# Patient Record
Sex: Female | Born: 1945
Health system: Southern US, Community
[De-identification: ages and names within clinical notes are randomized; demographics above are authoritative.]

## PROBLEM LIST (undated history)

## (undated) DIAGNOSIS — I10 Essential (primary) hypertension: Secondary | ICD-10-CM

## (undated) DIAGNOSIS — D509 Iron deficiency anemia, unspecified: Secondary | ICD-10-CM

## (undated) DIAGNOSIS — J449 Chronic obstructive pulmonary disease, unspecified: Secondary | ICD-10-CM

## (undated) DIAGNOSIS — N390 Urinary tract infection, site not specified: Secondary | ICD-10-CM

## (undated) DIAGNOSIS — G4762 Sleep related leg cramps: Secondary | ICD-10-CM

## (undated) DIAGNOSIS — J189 Pneumonia, unspecified organism: Secondary | ICD-10-CM

## (undated) DIAGNOSIS — F1011 Alcohol abuse, in remission: Secondary | ICD-10-CM

## (undated) DIAGNOSIS — K589 Irritable bowel syndrome without diarrhea: Secondary | ICD-10-CM

## (undated) DIAGNOSIS — J479 Bronchiectasis, uncomplicated: Secondary | ICD-10-CM

## (undated) DIAGNOSIS — N179 Acute kidney failure, unspecified: Secondary | ICD-10-CM

## (undated) DIAGNOSIS — R0689 Other abnormalities of breathing: Secondary | ICD-10-CM

## (undated) DIAGNOSIS — K219 Gastro-esophageal reflux disease without esophagitis: Secondary | ICD-10-CM

## (undated) DIAGNOSIS — D649 Anemia, unspecified: Secondary | ICD-10-CM

## (undated) DIAGNOSIS — B9620 Unspecified Escherichia coli [E. coli] as the cause of diseases classified elsewhere: Secondary | ICD-10-CM

## (undated) DIAGNOSIS — J45909 Unspecified asthma, uncomplicated: Secondary | ICD-10-CM

## (undated) DIAGNOSIS — I499 Cardiac arrhythmia, unspecified: Secondary | ICD-10-CM

## (undated) DIAGNOSIS — E119 Type 2 diabetes mellitus without complications: Secondary | ICD-10-CM

## (undated) DIAGNOSIS — E785 Hyperlipidemia, unspecified: Secondary | ICD-10-CM

## (undated) DIAGNOSIS — R000 Tachycardia, unspecified: Secondary | ICD-10-CM

## (undated) DIAGNOSIS — R0600 Dyspnea, unspecified: Secondary | ICD-10-CM

## (undated) DIAGNOSIS — J40 Bronchitis, not specified as acute or chronic: Secondary | ICD-10-CM

## (undated) DIAGNOSIS — M069 Rheumatoid arthritis, unspecified: Secondary | ICD-10-CM

## (undated) DIAGNOSIS — J849 Interstitial pulmonary disease, unspecified: Secondary | ICD-10-CM

## (undated) DIAGNOSIS — J301 Allergic rhinitis due to pollen: Secondary | ICD-10-CM

## (undated) DIAGNOSIS — M1990 Unspecified osteoarthritis, unspecified site: Secondary | ICD-10-CM

## (undated) DIAGNOSIS — K759 Inflammatory liver disease, unspecified: Secondary | ICD-10-CM

## (undated) HISTORY — DX: Interstitial pulmonary disease, unspecified: J84.9

## (undated) HISTORY — DX: Essential (primary) hypertension: I10

## (undated) HISTORY — DX: Tachycardia, unspecified: R00.0

## (undated) HISTORY — DX: Unspecified Escherichia coli (E. coli) as the cause of diseases classified elsewhere: B96.20

## (undated) HISTORY — DX: Dyspnea, unspecified: R06.00

## (undated) HISTORY — DX: Rheumatoid arthritis, unspecified: M06.9

## (undated) HISTORY — DX: Type 2 diabetes mellitus without complications: E11.9

## (undated) HISTORY — DX: Iron deficiency anemia, unspecified: D50.9

## (undated) HISTORY — PX: HEMORRHOID SURGERY: SHX153

## (undated) HISTORY — PX: HERNIA REPAIR: SHX51

## (undated) HISTORY — DX: Chronic obstructive pulmonary disease, unspecified: J44.9

## (undated) HISTORY — DX: Allergic rhinitis due to pollen: J30.1

## (undated) HISTORY — DX: Sleep related leg cramps: G47.62

## (undated) HISTORY — DX: Acute kidney failure, unspecified: N17.9

## (undated) HISTORY — DX: Hyperlipidemia, unspecified: E78.5

## (undated) HISTORY — DX: Other abnormalities of breathing: R06.89

## (undated) HISTORY — DX: Bronchiectasis, uncomplicated: J47.9

## (undated) HISTORY — DX: Urinary tract infection, site not specified: N39.0

## (undated) HISTORY — PX: ABDOMINAL HYSTERECTOMY: SHX81

---

## 1994-11-13 DIAGNOSIS — M069 Rheumatoid arthritis, unspecified: Secondary | ICD-10-CM

## 1994-11-13 HISTORY — DX: Rheumatoid arthritis, unspecified: M06.9

## 2014-12-21 ENCOUNTER — Encounter (HOSPITAL_COMMUNITY): Payer: Self-pay | Admitting: Emergency Medicine

## 2014-12-21 ENCOUNTER — Emergency Department (HOSPITAL_COMMUNITY)
Admission: EM | Admit: 2014-12-21 | Discharge: 2014-12-21 | Disposition: A | Discharge status: Home / Self Care | Payer: Medicare HMO | Attending: Emergency Medicine | Admitting: Emergency Medicine

## 2014-12-21 DIAGNOSIS — E119 Type 2 diabetes mellitus without complications: Secondary | ICD-10-CM | POA: Diagnosis not present

## 2014-12-21 DIAGNOSIS — L301 Dyshidrosis [pompholyx]: Secondary | ICD-10-CM

## 2014-12-21 DIAGNOSIS — Z880 Allergy status to penicillin: Secondary | ICD-10-CM | POA: Diagnosis not present

## 2014-12-21 DIAGNOSIS — R21 Rash and other nonspecific skin eruption: Secondary | ICD-10-CM | POA: Diagnosis present

## 2014-12-21 HISTORY — DX: Type 2 diabetes mellitus without complications: E11.9

## 2014-12-21 MED ORDER — CLOBETASOL PROPIONATE 0.05 % EX GEL: 1.0000 "application " | Freq: Two times a day (BID) | CUTANEOUS | Status: DC

## 2014-12-21 NOTE — ED Provider Notes (Signed)
CSN: [id number]     Arrival date & time 12/21/14  1509 History  This chart was scribed for Dierdre Forth, PA, working with Mirian Mo, MD by Elon Spanner, ED Scribe. This patient was seen in room TR10C/TR10C and the patient's care was started at 4:47 PM.   Chief Complaint  Patient presents with  . Finger Injury   The history is provided by the patient and medical records. No language interpreter was used.   HPI Comments: Anita Gonzalez is a 69 y.o. female who presents to the Emergency Department complaining of a worsening cracking of the skin at the top of the right thumb onset several months ago that became red and began to peel several weeks ago.  Patient reports she has been wearing a plastic glove to help retain moisture.  Patient has also used neosporin daily. The patient reports she washes dishes and clothes at home, but denies that her hands are wet more than normal.  Patient reports a history of eczema.  Patient denies history of cold sores.  Patient does not bite her finger nails or cuticles.  No previous history of similar episodes. Patient denies fever, chills, N/V, increased warmth in the finger, streaking, purulent drainage from the site, pain at the site.  Patient is a diabetic and reports her blood sugars have been 103-108. She replies she has been compliant with her metformin.  Past Medical History  Diagnosis Date  . Diabetes mellitus without complication    History reviewed. No pertinent past surgical history. History reviewed. No pertinent family history. History  Substance Use Topics  . Smoking status: Never Smoker   . Smokeless tobacco: Not on file  . Alcohol Use: Yes     Comment: occ   OB History    No data available     Review of Systems  Constitutional: Negative for fever, chills, fatigue and unexpected weight change.  HENT: Negative for mouth sores.   Respiratory: Negative for chest tightness, shortness of breath and wheezing.   Gastrointestinal: Negative  for nausea, vomiting and diarrhea.  Endocrine: Negative for polydipsia, polyphagia and polyuria.  Musculoskeletal: Negative for neck pain and neck stiffness.  Skin: Positive for color change and rash. Negative for wound.  Allergic/Immunologic: Negative for immunocompromised state.  Hematological: Negative for adenopathy. Does not bruise/bleed easily.  Psychiatric/Behavioral: The patient is not nervous/anxious.     Patient has a history of DM controlled by metformin.  She checks her blood sugar regularly and it was recently measured in the low 100s.   Allergies  Iodinated diagnostic agents; Penicillins; and Percocet  Home Medications   Prior to Admission medications   Medication Sig Start Date End Date Taking? Authorizing Provider  clobetasol (TEMOVATE) 0.05 % GEL Apply 1 application topically 2 (two) times daily. 12/21/14   Junelle Hashemi, PA-C   BP 110/59 mmHg  Pulse 85  Temp(Src) 98.3 F (36.8 C) (Oral)  Resp 18  Ht 5\' 1"  (1.549 m)  Wt 221 lb (100.245 kg)  BMI 41.78 kg/m2  SpO2 95% Physical Exam  Constitutional: She is oriented to person, place, and time. She appears well-developed and well-nourished. No distress.  HENT:  Head: Normocephalic and atraumatic.  Right Ear: Tympanic membrane, external ear and ear canal normal.  Left Ear: Tympanic membrane, external ear and ear canal normal.  Nose: Nose normal. No mucosal edema or rhinorrhea.  Mouth/Throat: Uvula is midline. No uvula swelling. No oropharyngeal exudate, posterior oropharyngeal edema, posterior oropharyngeal erythema or tonsillar abscesses.  No swelling of  the uvula or oropharynx   Eyes: Conjunctivae are normal.  Neck: Normal range of motion.  Patent airway No stridor; normal phonation Handling secretions without difficulty  Cardiovascular: Normal rate, normal heart sounds and intact distal pulses.   No murmur heard. Pulmonary/Chest: Effort normal and breath sounds normal. No stridor. No respiratory  distress. She has no wheezes.  No wheezes or rhonchi  Abdominal: Soft. Bowel sounds are normal. There is no tenderness.  Musculoskeletal: Normal range of motion. She exhibits no edema.  Neurological: She is alert and oriented to person, place, and time.  Skin: Skin is warm and dry. Rash noted. She is not diaphoretic.  Right thumb with small vesicles, dry and peeling skin along the dorsum of the thumb, palmar side and lateral aspects. No increased warmth, induration, purulent drainage. No evidence of felon or paronychia. Patient with excoriations noted to the rash  Psychiatric: She has a normal mood and affect.  Nursing note and vitals reviewed.   ED Course  Procedures (including critical care time)  DIAGNOSTIC STUDIES: Oxygen Saturation is 94% on RA, adequate by my interpretation.    COORDINATION OF CARE:  4:52 PM Discussed treatment plan with patient at bedside.  Patient acknowledges and agrees with plan.    Labs Review Labs Reviewed - No data to display  Imaging Review No results found.   EKG Interpretation None      MDM   Final diagnoses:  Dyshidrotic eczema  Non-insulin dependent type 2 diabetes mellitus   Anita Gonzalez presents with rash to the right thumb present for several months and worsening in the last several weeks. Rash consistent with dyshidrotic eczema. Patient has been keeping the site wet with Neosporin and clothes to keep it "moist" I believe this is made things worse. There is no evidence of infection, secondary or primary. No purulent drainage, induration or increased warmth. Patient will be given a topical steroid for it. She is to see her primary care doctor in 3 days for follow-up and is to obtain a referral to dermatology for further evaluation and treatment. Precautions given for return if infectious symptoms appear and these have been discussed with the patient.  Patient with history of diabetes however reports blood sugars at home have been within the  normal range and there is no evidence of infection today.  I have personally reviewed patient's vitals, nursing note and any pertinent labs or imaging.  I performed an focused physical exam; undressed when appropriate .    It has been determined that no acute conditions requiring further emergency intervention are present at this time. The patient/guardian have been advised of the diagnosis and plan. I reviewed any labs and imaging including any potential incidental findings. We have discussed signs and symptoms that warrant return to the ED and they are listed in the discharge instructions.    Vital signs are stable at discharge.   BP 110/59 mmHg  Pulse 85  Temp(Src) 98.3 F (36.8 C) (Oral)  Resp 18  Ht 5\' 1"  (1.549 m)  Wt 221 lb (100.245 kg)  BMI 41.78 kg/m2  SpO2 95%   I personally performed the services described in this documentation, which was scribed in my presence. The recorded information has been reviewed and is accurate.   Pruitt Taboada, PA-C 12/21/14 1732  02/19/15, MD 12/24/14 [phone]

## 2014-12-21 NOTE — ED Notes (Signed)
Declined W/C at D/C and was escorted to lobby by RN. 

## 2014-12-21 NOTE — Discharge Instructions (Signed)
1. Medications: Clobetasol gel applied to your finger, usual home medications 2. Treatment: rest, drink plenty of fluids, use lukewarm water, avoid soap, detergents, alcohol or stringent solutions; right hands thoroughly after washing, wear protective gloves in cold weather 3. Follow Up: Please followup with your primary doctor in 2 days for discussion of your diagnoses and further evaluation after today's visit; if you do not have a primary care doctor use the resource guide provided to find one; Please return to the ER for worsening symptoms, swelling, purulent drainage, streaking up the finger, hand or arm, systemic fevers

## 2014-12-21 NOTE — ED Notes (Signed)
Pt with right thumb cuts that now skin is peeling and some areas are red; no drainage noted; pt sts wound x 1 week ago

## 2015-01-04 ENCOUNTER — Telehealth: Payer: Self-pay | Admitting: Hematology

## 2015-01-04 NOTE — Telephone Encounter (Signed)
vm has not been set up unable to lv mess regarding appt called referring office for alt number. Dx:  Chronic anemia Referring Dr. Regan Lemming

## 2015-01-06 ENCOUNTER — Encounter (HOSPITAL_COMMUNITY): Payer: Self-pay | Admitting: Emergency Medicine

## 2015-01-06 ENCOUNTER — Emergency Department (HOSPITAL_COMMUNITY): Payer: Medicare HMO

## 2015-01-06 ENCOUNTER — Inpatient Hospital Stay (HOSPITAL_COMMUNITY)
Admission: EM | Admit: 2015-01-06 | Discharge: 2015-01-11 | DRG: 871 | Disposition: A | Discharge status: Home Health | Payer: Medicare HMO | Attending: Internal Medicine | Admitting: Internal Medicine

## 2015-01-06 DIAGNOSIS — N179 Acute kidney failure, unspecified: Secondary | ICD-10-CM | POA: Diagnosis present

## 2015-01-06 DIAGNOSIS — R630 Anorexia: Secondary | ICD-10-CM | POA: Diagnosis present

## 2015-01-06 DIAGNOSIS — M1990 Unspecified osteoarthritis, unspecified site: Secondary | ICD-10-CM | POA: Diagnosis present

## 2015-01-06 DIAGNOSIS — B9620 Unspecified Escherichia coli [E. coli] as the cause of diseases classified elsewhere: Secondary | ICD-10-CM | POA: Diagnosis present

## 2015-01-06 DIAGNOSIS — Z79899 Other long term (current) drug therapy: Secondary | ICD-10-CM

## 2015-01-06 DIAGNOSIS — E860 Dehydration: Secondary | ICD-10-CM | POA: Diagnosis present

## 2015-01-06 DIAGNOSIS — F5104 Psychophysiologic insomnia: Secondary | ICD-10-CM | POA: Diagnosis present

## 2015-01-06 DIAGNOSIS — J45901 Unspecified asthma with (acute) exacerbation: Secondary | ICD-10-CM | POA: Diagnosis present

## 2015-01-06 DIAGNOSIS — J9601 Acute respiratory failure with hypoxia: Secondary | ICD-10-CM | POA: Diagnosis present

## 2015-01-06 DIAGNOSIS — D509 Iron deficiency anemia, unspecified: Secondary | ICD-10-CM | POA: Diagnosis present

## 2015-01-06 DIAGNOSIS — J189 Pneumonia, unspecified organism: Secondary | ICD-10-CM | POA: Diagnosis present

## 2015-01-06 DIAGNOSIS — E119 Type 2 diabetes mellitus without complications: Secondary | ICD-10-CM

## 2015-01-06 DIAGNOSIS — M069 Rheumatoid arthritis, unspecified: Secondary | ICD-10-CM | POA: Diagnosis present

## 2015-01-06 DIAGNOSIS — D649 Anemia, unspecified: Secondary | ICD-10-CM | POA: Diagnosis present

## 2015-01-06 DIAGNOSIS — Z87891 Personal history of nicotine dependence: Secondary | ICD-10-CM

## 2015-01-06 DIAGNOSIS — E785 Hyperlipidemia, unspecified: Secondary | ICD-10-CM

## 2015-01-06 DIAGNOSIS — J441 Chronic obstructive pulmonary disease with (acute) exacerbation: Secondary | ICD-10-CM | POA: Diagnosis present

## 2015-01-06 DIAGNOSIS — I10 Essential (primary) hypertension: Secondary | ICD-10-CM

## 2015-01-06 DIAGNOSIS — A419 Sepsis, unspecified organism: Secondary | ICD-10-CM | POA: Diagnosis present

## 2015-01-06 DIAGNOSIS — I4510 Unspecified right bundle-branch block: Secondary | ICD-10-CM | POA: Diagnosis present

## 2015-01-06 DIAGNOSIS — R509 Fever, unspecified: Secondary | ICD-10-CM | POA: Diagnosis present

## 2015-01-06 DIAGNOSIS — R000 Tachycardia, unspecified: Secondary | ICD-10-CM | POA: Diagnosis present

## 2015-01-06 DIAGNOSIS — N390 Urinary tract infection, site not specified: Secondary | ICD-10-CM | POA: Diagnosis present

## 2015-01-06 DIAGNOSIS — R0602 Shortness of breath: Secondary | ICD-10-CM

## 2015-01-06 HISTORY — DX: Unspecified osteoarthritis, unspecified site: M19.90

## 2015-01-06 HISTORY — DX: Hyperlipidemia, unspecified: E78.5

## 2015-01-06 HISTORY — DX: Type 2 diabetes mellitus without complications: E11.9

## 2015-01-06 HISTORY — DX: Essential (primary) hypertension: I10

## 2015-01-06 HISTORY — DX: Unspecified asthma, uncomplicated: J45.909

## 2015-01-06 LAB — URINALYSIS, ROUTINE W REFLEX MICROSCOPIC
Bilirubin Urine: NEGATIVE
Glucose, UA: NEGATIVE mg/dL
Hgb urine dipstick: NEGATIVE
KETONES UR: NEGATIVE mg/dL
NITRITE: NEGATIVE
Protein, ur: NEGATIVE mg/dL
Specific Gravity, Urine: 1.013 (ref 1.005–1.030)
UROBILINOGEN UA: 1 mg/dL (ref 0.0–1.0)
pH: 6 (ref 5.0–8.0)

## 2015-01-06 LAB — I-STAT CG4 LACTIC ACID, ED: Lactic Acid, Venous: 1.14 mmol/L (ref 0.5–2.0)

## 2015-01-06 LAB — URINE MICROSCOPIC-ADD ON

## 2015-01-06 LAB — COMPREHENSIVE METABOLIC PANEL
ALBUMIN: 3.6 g/dL (ref 3.5–5.2)
ALT: 21 U/L (ref 0–35)
AST: 29 U/L (ref 0–37)
Alkaline Phosphatase: 90 U/L (ref 39–117)
Anion gap: 10 (ref 5–15)
BUN: 14 mg/dL (ref 6–23)
CALCIUM: 9 mg/dL (ref 8.4–10.5)
CO2: 27 mmol/L (ref 19–32)
CREATININE: 1.17 mg/dL — AB (ref 0.50–1.10)
Chloride: 100 mmol/L (ref 96–112)
GFR calc Af Amer: 55 mL/min — ABNORMAL LOW (ref >90)
GFR calc non Af Amer: 47 mL/min — ABNORMAL LOW (ref >90)
Glucose, Bld: 97 mg/dL (ref 70–99)
Potassium: 4.7 mmol/L (ref 3.5–5.1)
SODIUM: 137 mmol/L (ref 135–145)
Total Bilirubin: 0.6 mg/dL (ref 0.3–1.2)
Total Protein: 7.4 g/dL (ref 6.0–8.3)

## 2015-01-06 LAB — CBC
HCT: 27.7 % — ABNORMAL LOW (ref 36.0–46.0)
Hemoglobin: 8.3 g/dL — ABNORMAL LOW (ref 12.0–15.0)
MCH: 25.9 pg — AB (ref 26.0–34.0)
MCHC: 30 g/dL (ref 30.0–36.0)
MCV: 86.3 fL (ref 78.0–100.0)
Platelets: 355 10*3/uL (ref 150–400)
RBC: 3.21 MIL/uL — ABNORMAL LOW (ref 3.87–5.11)
RDW: 17.2 % — AB (ref 11.5–15.5)
WBC: 12.5 10*3/uL — ABNORMAL HIGH (ref 4.0–10.5)

## 2015-01-06 LAB — STREP PNEUMONIAE URINARY ANTIGEN: Strep Pneumo Urinary Antigen: NEGATIVE

## 2015-01-06 LAB — EXPECTORATED SPUTUM ASSESSMENT W REFEX TO RESP CULTURE

## 2015-01-06 LAB — EXPECTORATED SPUTUM ASSESSMENT W GRAM STAIN, RFLX TO RESP C

## 2015-01-06 LAB — GLUCOSE, CAPILLARY: GLUCOSE-CAPILLARY: 245 mg/dL — AB (ref 70–99)

## 2015-01-06 MED ORDER — METHYLPREDNISOLONE SODIUM SUCC 40 MG IJ SOLR
40.0000 mg | Freq: Two times a day (BID) | INTRAMUSCULAR | Status: AC
  Administered 2015-01-07 – 2015-01-09 (×6): 40 mg via INTRAVENOUS
  Filled 2015-01-06 (×6): qty 1

## 2015-01-06 MED ORDER — METHYLPREDNISOLONE SODIUM SUCC 125 MG IJ SOLR
125.0000 mg | Freq: Once | INTRAMUSCULAR | Status: AC
  Administered 2015-01-06: 125 mg via INTRAVENOUS
  Filled 2015-01-06: qty 2

## 2015-01-06 MED ORDER — CYCLOBENZAPRINE HCL 10 MG PO TABS
10.0000 mg | ORAL_TABLET | Freq: Every day | ORAL | Status: DC
  Administered 2015-01-06 – 2015-01-11 (×6): 10 mg via ORAL
  Filled 2015-01-06 (×6): qty 1

## 2015-01-06 MED ORDER — BUMETANIDE 2 MG PO TABS
2.0000 mg | ORAL_TABLET | Freq: Every day | ORAL | Status: DC
  Filled 2015-01-06: qty 1

## 2015-01-06 MED ORDER — IPRATROPIUM BROMIDE 0.02 % IN SOLN
0.5000 mg | Freq: Four times a day (QID) | RESPIRATORY_TRACT | Status: DC
  Administered 2015-01-06 – 2015-01-07 (×3): 0.5 mg via RESPIRATORY_TRACT
  Filled 2015-01-06 (×3): qty 2.5

## 2015-01-06 MED ORDER — ENOXAPARIN SODIUM 60 MG/0.6ML ~~LOC~~ SOLN
50.0000 mg | SUBCUTANEOUS | Status: DC
  Administered 2015-01-06 – 2015-01-07 (×2): 50 mg via SUBCUTANEOUS
  Filled 2015-01-06 (×3): qty 0.6

## 2015-01-06 MED ORDER — INSULIN ASPART 100 UNIT/ML ~~LOC~~ SOLN
0.0000 [IU] | Freq: Three times a day (TID) | SUBCUTANEOUS | Status: DC
  Administered 2015-01-07: 1 [IU] via SUBCUTANEOUS
  Administered 2015-01-07 (×2): 2 [IU] via SUBCUTANEOUS
  Administered 2015-01-08 – 2015-01-10 (×4): 1 [IU] via SUBCUTANEOUS
  Administered 2015-01-10: 2 [IU] via SUBCUTANEOUS
  Administered 2015-01-11: 1 [IU] via SUBCUTANEOUS

## 2015-01-06 MED ORDER — CLOBETASOL PROPIONATE 0.05 % EX OINT
TOPICAL_OINTMENT | Freq: Two times a day (BID) | CUTANEOUS | Status: DC
  Administered 2015-01-06 – 2015-01-11 (×9): via TOPICAL
  Filled 2015-01-06 (×2): qty 15

## 2015-01-06 MED ORDER — GUAIFENESIN ER 600 MG PO TB12
600.0000 mg | ORAL_TABLET | Freq: Two times a day (BID) | ORAL | Status: DC
  Administered 2015-01-06 – 2015-01-08 (×4): 600 mg via ORAL
  Filled 2015-01-06 (×4): qty 1

## 2015-01-06 MED ORDER — MONTELUKAST SODIUM 10 MG PO TABS
10.0000 mg | ORAL_TABLET | Freq: Every day | ORAL | Status: DC
  Administered 2015-01-06 – 2015-01-10 (×5): 10 mg via ORAL
  Filled 2015-01-06 (×5): qty 1

## 2015-01-06 MED ORDER — IBUPROFEN 200 MG PO TABS
600.0000 mg | ORAL_TABLET | Freq: Three times a day (TID) | ORAL | Status: DC
  Administered 2015-01-06: 600 mg via ORAL
  Filled 2015-01-06: qty 3

## 2015-01-06 MED ORDER — ACETAMINOPHEN 650 MG RE SUPP: 650.0000 mg | Freq: Four times a day (QID) | RECTAL | Status: DC | PRN

## 2015-01-06 MED ORDER — CEFTRIAXONE SODIUM IN DEXTROSE 20 MG/ML IV SOLN
1.0000 g | INTRAVENOUS | Status: DC
  Administered 2015-01-07 – 2015-01-10 (×4): 1 g via INTRAVENOUS
  Filled 2015-01-06 (×5): qty 50

## 2015-01-06 MED ORDER — SODIUM CHLORIDE 0.9 % IJ SOLN
3.0000 mL | Freq: Two times a day (BID) | INTRAMUSCULAR | Status: DC
  Administered 2015-01-06 – 2015-01-11 (×6): 3 mL via INTRAVENOUS

## 2015-01-06 MED ORDER — PANTOPRAZOLE SODIUM 40 MG PO TBEC
40.0000 mg | DELAYED_RELEASE_TABLET | Freq: Every day | ORAL | Status: DC
  Administered 2015-01-07 – 2015-01-11 (×5): 40 mg via ORAL
  Filled 2015-01-06 (×5): qty 1

## 2015-01-06 MED ORDER — LEVALBUTEROL HCL 1.25 MG/0.5ML IN NEBU
1.2500 mg | INHALATION_SOLUTION | Freq: Once | RESPIRATORY_TRACT | Status: AC
  Administered 2015-01-06: 1.25 mg via RESPIRATORY_TRACT
  Filled 2015-01-06: qty 0.5

## 2015-01-06 MED ORDER — ACETAMINOPHEN 500 MG PO TABS
1000.0000 mg | ORAL_TABLET | Freq: Once | ORAL | Status: AC
  Administered 2015-01-06: 1000 mg via ORAL
  Filled 2015-01-06: qty 2

## 2015-01-06 MED ORDER — IRBESARTAN 150 MG PO TABS: 75.0000 mg | ORAL_TABLET | Freq: Every day | ORAL | Status: DC

## 2015-01-06 MED ORDER — CLOBETASOL PROPIONATE 0.05 % EX GEL: 1.0000 "application " | Freq: Two times a day (BID) | CUTANEOUS | Status: DC

## 2015-01-06 MED ORDER — SODIUM CHLORIDE 0.9 % IV SOLN
INTRAVENOUS | Status: AC
  Administered 2015-01-06: 19:00:00 via INTRAVENOUS

## 2015-01-06 MED ORDER — SIMVASTATIN 20 MG PO TABS
20.0000 mg | ORAL_TABLET | Freq: Every day | ORAL | Status: DC
  Administered 2015-01-06 – 2015-01-10 (×5): 20 mg via ORAL
  Filled 2015-01-06 (×5): qty 1

## 2015-01-06 MED ORDER — DEXTROSE 5 % IV SOLN
500.0000 mg | Freq: Once | INTRAVENOUS | Status: AC
  Administered 2015-01-06: 500 mg via INTRAVENOUS
  Filled 2015-01-06: qty 500

## 2015-01-06 MED ORDER — ONDANSETRON HCL 4 MG PO TABS: 4.0000 mg | ORAL_TABLET | Freq: Four times a day (QID) | ORAL | Status: DC | PRN

## 2015-01-06 MED ORDER — ONDANSETRON HCL 4 MG/2ML IJ SOLN: 4.0000 mg | Freq: Four times a day (QID) | INTRAMUSCULAR | Status: DC | PRN

## 2015-01-06 MED ORDER — DICLOFENAC SODIUM 1 % TD GEL
2.0000 g | Freq: Two times a day (BID) | TRANSDERMAL | Status: DC
  Administered 2015-01-06 – 2015-01-10 (×8): 2 g via TOPICAL
  Filled 2015-01-06: qty 100

## 2015-01-06 MED ORDER — VERAPAMIL HCL ER 240 MG PO TBCR
240.0000 mg | EXTENDED_RELEASE_TABLET | Freq: Every day | ORAL | Status: DC
  Administered 2015-01-06 – 2015-01-09 (×4): 240 mg via ORAL
  Filled 2015-01-06 (×5): qty 1

## 2015-01-06 MED ORDER — ACETAMINOPHEN 325 MG PO TABS
650.0000 mg | ORAL_TABLET | Freq: Four times a day (QID) | ORAL | Status: DC | PRN
  Administered 2015-01-10: 650 mg via ORAL
  Filled 2015-01-06: qty 2

## 2015-01-06 MED ORDER — VITAMINS A & D EX OINT
TOPICAL_OINTMENT | CUTANEOUS | Status: AC
  Administered 2015-01-06: 22:00:00
  Filled 2015-01-06: qty 5

## 2015-01-06 MED ORDER — DEXTROSE 5 % IV SOLN
1.0000 g | Freq: Once | INTRAVENOUS | Status: AC
  Administered 2015-01-06: 1 g via INTRAVENOUS
  Filled 2015-01-06: qty 10

## 2015-01-06 MED ORDER — ALBUTEROL SULFATE (2.5 MG/3ML) 0.083% IN NEBU: 2.5000 mg | INHALATION_SOLUTION | RESPIRATORY_TRACT | Status: DC | PRN

## 2015-01-06 MED ORDER — GUAIFENESIN-DM 100-10 MG/5ML PO SYRP
5.0000 mL | ORAL_SOLUTION | ORAL | Status: DC | PRN
  Administered 2015-01-10: 5 mL via ORAL
  Filled 2015-01-06: qty 10

## 2015-01-06 MED ORDER — ALBUTEROL SULFATE (2.5 MG/3ML) 0.083% IN NEBU
2.5000 mg | INHALATION_SOLUTION | Freq: Four times a day (QID) | RESPIRATORY_TRACT | Status: DC
Start: 2015-01-06 — End: 2015-01-07
  Administered 2015-01-06 – 2015-01-07 (×3): 2.5 mg via RESPIRATORY_TRACT
  Filled 2015-01-06 (×3): qty 3

## 2015-01-06 MED ORDER — IPRATROPIUM BROMIDE 0.02 % IN SOLN
1.0000 mg | Freq: Once | RESPIRATORY_TRACT | Status: AC
  Administered 2015-01-06: 1 mg via RESPIRATORY_TRACT
  Filled 2015-01-06: qty 5

## 2015-01-06 MED ORDER — POTASSIUM CHLORIDE CRYS ER 10 MEQ PO TBCR: 10.0000 meq | EXTENDED_RELEASE_TABLET | Freq: Every day | ORAL | Status: DC

## 2015-01-06 MED ORDER — VERAPAMIL HCL ER 240 MG PO CP24: 240.0000 mg | ORAL_CAPSULE | Freq: Every day | ORAL | Status: DC

## 2015-01-06 MED ORDER — MOMETASONE FURO-FORMOTEROL FUM 200-5 MCG/ACT IN AERO
2.0000 | INHALATION_SPRAY | Freq: Two times a day (BID) | RESPIRATORY_TRACT | Status: DC
  Administered 2015-01-07 – 2015-01-11 (×9): 2 via RESPIRATORY_TRACT
  Filled 2015-01-06: qty 8.8

## 2015-01-06 MED ORDER — DEXTROSE 5 % IV SOLN
500.0000 mg | INTRAVENOUS | Status: DC
  Administered 2015-01-07 – 2015-01-10 (×4): 500 mg via INTRAVENOUS
  Filled 2015-01-06 (×4): qty 500

## 2015-01-06 MED ORDER — FOLIC ACID 1 MG PO TABS
1.0000 mg | ORAL_TABLET | Freq: Every day | ORAL | Status: DC
  Administered 2015-01-07 – 2015-01-11 (×5): 1 mg via ORAL
  Filled 2015-01-06 (×5): qty 1

## 2015-01-06 NOTE — ED Notes (Signed)
Bed: WA14 Expected date:  Expected time:  Means of arrival:  Comments: EMS respiratory distress 

## 2015-01-06 NOTE — ED Notes (Signed)
Per EMS pt comes from home for shob x week. Pt has PMH asthma and had cold as well.

## 2015-01-06 NOTE — ED Notes (Signed)
Patient currently receiving a breathing treatment, will obtain urine sample after.

## 2015-01-06 NOTE — H&P (Signed)
History and Physical  Atenas Homolka WGN:[id number] DOB: 07/14/1946 DOA: 01/06/2015  Referring physician: Dr. Elwin Mocha, EDP PCP: Dr. Lerry Liner Outpatient Specialists:  1. None  Chief Complaint: Fever, chills, productive cough, dyspnea, wheezing, chest pain and poor appetite.  HPI: Anita Gonzalez is a 69 y.o. female with history of DM 2, HTN, HLD, asthma, rheumatoid arthritis, former smoker, recently moved to this area from [place], presented to the ED with above complaints. She started noticing above symptoms a week ago. Unable to check temperatures at home-no thermometer. Cough initially productive of white sputum which subsequently changed color to [facility] then green. Dyspnea and wheezing have been progressive and worsening. Patient complains of mostly right mid chest pain only on deep breathing or violent coughing. No hemoptysis. Appetite decreased. No nausea, vomiting, abdominal pain or diarrhea. No urinary frequency or dysuria. No history of recent long-distance travel or asymmetric leg swelling or pain. She complains of diffuse aches and pains. She states that she was unable to to seek medical attention due to lack of transportation. In the ED, febrile to 102.6F, tachycardic, WBC 12.5 and chest x-ray suggestive of right lower lobe pneumonia. Hospitalist admission requested.   Review of Systems: All systems reviewed and apart from history of presenting illness, are negative.  Past Medical History  Diagnosis Date  . Diabetes mellitus without complication   . Asthma   . Arthritis   . Hypertension    Past Surgical History  Procedure Laterality Date  . Cesarean section    . Hernia repair    . Hemorrhoid surgery     Social History:  reports that she has quit smoking. She does not have any smokeless tobacco history on file. She reports that she drinks alcohol. She reports that she does not use illicit drugs. Single. Lives with patient's daughter and son-in-law. Ambulates with the  help of a cane.  Allergies  Allergen Reactions  . Fish Allergy Anaphylaxis  . Iodinated Diagnostic Agents   . Penicillins   . Percocet [Oxycodone-Acetaminophen]   . Prednisone Nausea Only    History reviewed. No pertinent family history. no significant family history.  Prior to Admission medications   Medication Sig Start Date End Date Taking? Authorizing Provider  albuterol (PROVENTIL HFA;VENTOLIN HFA) 108 (90 BASE) MCG/ACT inhaler Inhale 2 puffs into the lungs every 6 (six) hours as needed for wheezing or shortness of breath (wheezing).    Yes Historical Provider, MD  bumetanide (BUMEX) 2 MG tablet Take 2 mg by mouth daily.   Yes Historical Provider, MD  cyclobenzaprine (FLEXERIL) 10 MG tablet Take 10 mg by mouth daily.   Yes Historical Provider, MD  diclofenac sodium (VOLTAREN) 1 % GEL Apply 2 g topically 2 (two) times daily.   Yes Historical Provider, MD  Fluticasone-Salmeterol (ADVAIR) 500-50 MCG/DOSE AEPB Inhale 2 puffs into the lungs daily.   Yes Historical Provider, MD  folic acid (FOLVITE) 1 MG tablet Take 1 mg by mouth daily.   Yes Historical Provider, MD  ibuprofen (ADVIL,MOTRIN) 600 MG tablet Take 600 mg by mouth 3 (three) times daily.   Yes Historical Provider, MD  metFORMIN (GLUCOPHAGE) 1000 MG tablet Take 1,000 mg by mouth 2 (two) times daily with a meal.   Yes Historical Provider, MD  montelukast (SINGULAIR) 10 MG tablet Take 10 mg by mouth at bedtime.   Yes Historical Provider, MD  omeprazole (PRILOSEC) 20 MG capsule Take 20 mg by mouth daily.   Yes Historical Provider, MD  potassium chloride (K-DUR,KLOR-CON) 10  MEQ tablet Take 10 mEq by mouth daily.   Yes Historical Provider, MD  simvastatin (ZOCOR) 20 MG tablet Take 20 mg by mouth daily.   Yes Historical Provider, MD  valsartan (DIOVAN) 80 MG tablet Take 80 mg by mouth at bedtime.   Yes Historical Provider, MD  verapamil (VERELAN PM) 240 MG 24 hr capsule Take 240 mg by mouth daily.    Yes Historical Provider, MD    clobetasol (TEMOVATE) 0.05 % GEL Apply 1 application topically 2 (two) times daily. 12/21/14   Dierdre Forth, PA-C   Physical Exam: Filed Vitals:   01/06/15 1608 01/06/15 1658 01/06/15 1711 01/06/15 1730  BP: 140/52   119/62  Pulse: 122  114 112  Temp:      TempSrc:      Resp: 18  21 16   SpO2: 97% 99% 98% 98%     General exam: Moderately built and nourished pleasant middle-aged female patient, lying comfortably propped up on the gurney in no obvious distress.  Head, eyes and ENT: Nontraumatic and normocephalic. Pupils equally reacting to light and accommodation. Oral mucosa mildly dry. Bilateral cataracts and arcus senilis.  Neck: Supple. No JVD, carotid bruit or thyromegaly.  Lymphatics: No lymphadenopathy.  Respiratory system: Reduced breath sounds in the right base with bronchial breath sounds and a few crackles but no pleural rub. Occasional left basal crackles. Rest of lung fields clear to auscultation. No increased work of breathing. Able to speak in full sentences.  Cardiovascular system: S1 and S2 heard, regular tachycardic. No JVD, murmurs, gallops, clicks or pedal edema.  Gastrointestinal system: Abdomen is nondistended, soft and nontender. Normal bowel sounds heard. No organomegaly or masses appreciated.  Central nervous system: Alert and oriented. No focal neurological deficits.  Extremities: Symmetric 5 x 5 power. Peripheral pulses symmetrically felt.   Skin: No rashes or acute findings.  Musculoskeletal system: Negative exam.  Psychiatry: Pleasant and cooperative.   Labs on Admission:  Basic Metabolic Panel:  Recent Labs Lab 01/06/15 1531  NA 137  K 4.7  CL 100  CO2 27  GLUCOSE 97  BUN 14  CREATININE 1.17*  CALCIUM 9.0   Liver Function Tests:  Recent Labs Lab 01/06/15 1531  AST 29  ALT 21  ALKPHOS 90  BILITOT 0.6  PROT 7.4  ALBUMIN 3.6   No results for input(s): LIPASE, AMYLASE in the last 168 hours. No results for input(s):  AMMONIA in the last 168 hours. CBC:  Recent Labs Lab 01/06/15 1531  WBC 12.5*  HGB 8.3*  HCT 27.7*  MCV 86.3  PLT 355   Cardiac Enzymes: No results for input(s): CKTOTAL, CKMB, CKMBINDEX, TROPONINI in the last 168 hours.  BNP (last 3 results) No results for input(s): PROBNP in the last 8760 hours. CBG: No results for input(s): GLUCAP in the last 168 hours.  Radiological Exams on Admission: Dg Chest Portable 1 View  01/06/2015   CLINICAL DATA:  One week history of difficulty breathing  EXAM: PORTABLE CHEST - 1 VIEW  COMPARISON:  None.  FINDINGS: There is airspace consolidation in the right lower lobe. Lungs elsewhere clear. Heart size and pulmonary vascularity are normal. No adenopathy. No bone lesions.  IMPRESSION: Right lower lobe airspace consolidation.   Electronically Signed   By: 01/08/2015 III M.D.   On: 01/06/2015 16:03    EKG: Independently reviewed. Sinus tachycardia at 120 bpm,? MAT, normal axis, incomplete RBBB. No prior EKGs to compare  Assessment/Plan Principal Problem:   Community acquired pneumonia-RLL Active  Problems:   Sepsis due to pneumonia   Asthma exacerbation   Anemia   Essential hypertension   DM II (diabetes mellitus, type II), controlled   HLD (hyperlipidemia)   Rheumatoid arthritis   CAP (community acquired pneumonia)   1. Right lower lobe community acquired pneumonia: Continue IV Rocephin and azithromycin. Follow blood cultures, urine Legionella and streptococcal antigen results. 2. Sepsis due to pneumonia: Present on admission. IV fluids and antibiotics as above. Hemodynamically stable. 3. Asthma exacerbation: Precipitated by pneumonia. Improved after bronchodilators and a dose of Solu-Medrol in ED. Continue oxygen, bronchodilators, Dulera and brief IV Solu-Medrol. 4. Mild dehydration: Secondary to poor oral intake and sepsis. Brief IV fluids. Hold diuretics. 5. Essential hypertension: Controlled. Continue home medications except  diuretics. 6. Type II DM: Controlled. Hold metformin. SSI. 7. Rheumatoid arthritis: Patient takes scheduled NSAID/ibuprofen. Continue same. 8. Anemia: May be secondary to chronic disease. No prior labs to compare. Follow CBC in a.m. No reported bleeding. 9. History of HLD     Code Status: Full  Family Communication: Discussed with daughter at bedside.  Disposition Plan: Home when medically stable.   Time spent: 60 minutes.  Marcellus Scott, MD, FACP, FHM. [facility] Pager [phone]  If 7PM-7AM, please contact night-coverage [email or web address] Password [facility] 01/06/2015, 5:51 PM

## 2015-01-06 NOTE — ED Provider Notes (Signed)
CSN: [id number]     Arrival date & time 01/06/15  1510 History   First MD Initiated Contact with Patient 01/06/15 1520     Chief Complaint  Patient presents with  . Shortness of Breath  . Fever     (Consider location/radiation/quality/duration/timing/severity/associated sxs/prior Treatment) Patient is a 69 y.o. female presenting with shortness of breath and fever. The history is provided by the patient.  Shortness of Breath Severity:  Moderate Onset quality:  Gradual Duration:  5 days Timing:  Constant Progression:  Worsening Chronicity:  New Context: not activity, not animal exposure and not URI   Relieved by:  Nothing Worsened by:  Nothing tried Associated symptoms: abdominal pain (with coughing), cough and fever (intermittent for 2 days)   Associated symptoms: no vomiting   Fever Associated symptoms: cough and dysuria   Associated symptoms: no chills, no diarrhea, no nausea and no vomiting     Past Medical History  Diagnosis Date  . Diabetes mellitus without complication   . Asthma   . Arthritis   . Hypertension    Past Surgical History  Procedure Laterality Date  . Cesarean section    . Hernia repair    . Hemorrhoid surgery     No family history on file. History  Substance Use Topics  . Smoking status: Former Games developer  . Smokeless tobacco: Not on file  . Alcohol Use: Yes     Comment: occ   OB History    No data available     Review of Systems  Constitutional: Positive for fever (intermittent for 2 days). Negative for chills.  Respiratory: Positive for cough and shortness of breath.   Gastrointestinal: Positive for abdominal pain (with coughing). Negative for nausea, vomiting and diarrhea.  Genitourinary: Positive for dysuria.  All other systems reviewed and are negative.     Allergies  Fish allergy; Iodinated diagnostic agents; Penicillins; Percocet; and Prednisone  Home Medications   Prior to Admission medications   Medication Sig Start Date  End Date Taking? Authorizing Provider  benzonatate (TESSALON) 100 MG capsule Take 100 mg by mouth 2 (two) times daily.   Yes Historical Provider, MD  bumetanide (BUMEX) 2 MG tablet Take 2 mg by mouth daily.   Yes Historical Provider, MD  cyclobenzaprine (FLEXERIL) 10 MG tablet Take 10 mg by mouth daily.   Yes Historical Provider, MD  ibuprofen (ADVIL,MOTRIN) 600 MG tablet Take 600 mg by mouth 3 (three) times daily.   Yes Historical Provider, MD  metFORMIN (GLUCOPHAGE) 1000 MG tablet Take 1,000 mg by mouth 2 (two) times daily with a meal.   Yes Historical Provider, MD  montelukast (SINGULAIR) 10 MG tablet Take 10 mg by mouth at bedtime.   Yes Historical Provider, MD  omeprazole (PRILOSEC) 20 MG capsule Take 20 mg by mouth daily.   Yes Historical Provider, MD  potassium chloride (K-DUR,KLOR-CON) 10 MEQ tablet Take 10 mEq by mouth daily.   Yes Historical Provider, MD  simvastatin (ZOCOR) 20 MG tablet Take 20 mg by mouth daily.   Yes Historical Provider, MD  valsartan (DIOVAN) 40 MG tablet Take 40 mg by mouth daily.   Yes Historical Provider, MD  verapamil (CALAN-SR) 240 MG CR tablet Take 240 mg by mouth at bedtime.   Yes Historical Provider, MD  clobetasol (TEMOVATE) 0.05 % GEL Apply 1 application topically 2 (two) times daily. 12/21/14   Hannah Muthersbaugh, PA-C  verapamil (CALAN-SR) 240 MG CR tablet Take 240 mg by mouth at bedtime.    Historical  Provider, MD   BP 169/66 mmHg  Pulse 124  Temp(Src) 102.6 F (39.2 C) (Oral)  SpO2 97% Physical Exam  Constitutional: She is oriented to person, place, and time. She appears well-developed and well-nourished. No distress.  HENT:  Head: Normocephalic and atraumatic.  Mouth/Throat: Oropharynx is clear and moist.  Eyes: EOM are normal. Pupils are equal, round, and reactive to light.  Neck: Normal range of motion. Neck supple.  Cardiovascular: Regular rhythm.  Tachycardia present.  Exam reveals no friction rub.   No murmur heard. Pulmonary/Chest:  Effort normal. No respiratory distress. She has decreased breath sounds (diffusely). She has wheezes (moderate, diffuse). She has no rales.  Abdominal: Soft. She exhibits no distension. There is no tenderness. There is CVA tenderness (R sided). There is no rigidity, no rebound, no guarding, no tenderness at McBurney's point and negative Murphy's sign.  Musculoskeletal: Normal range of motion. She exhibits no edema.  Neurological: She is alert and oriented to person, place, and time.  Skin: She is not diaphoretic.  Nursing note and vitals reviewed.   ED Course  Procedures (including critical care time) Labs Review Labs Reviewed  CBC - Abnormal; Notable for the following:    WBC 12.5 (*)    RBC 3.21 (*)    Hemoglobin 8.3 (*)    HCT 27.7 (*)    MCH 25.9 (*)    RDW 17.2 (*)    All other components within normal limits  CULTURE, BLOOD (ROUTINE X 2)  CULTURE, BLOOD (ROUTINE X 2)  URINE CULTURE  COMPREHENSIVE METABOLIC PANEL  URINALYSIS, ROUTINE W REFLEX MICROSCOPIC  I-STAT CG4 LACTIC ACID, ED    Imaging Review Dg Chest Portable 1 View  01/06/2015   CLINICAL DATA:  One week history of difficulty breathing  EXAM: PORTABLE CHEST - 1 VIEW  COMPARISON:  None.  FINDINGS: There is airspace consolidation in the right lower lobe. Lungs elsewhere clear. Heart size and pulmonary vascularity are normal. No adenopathy. No bone lesions.  IMPRESSION: Right lower lobe airspace consolidation.   Electronically Signed   By: Bretta Bang III M.D.   On: 01/06/2015 16:03     EKG Interpretation   Date/Time:  Wednesday January 06 2015 15:15:13 EST Ventricular Rate:  120 PR Interval:  161 QRS Duration: 114 QT Interval:  332 QTC Calculation: 469 R Axis:   14 Text Interpretation:  Sinus tachycardia Ventricular trigeminy Incomplete  right bundle branch block Possible MAT No prior for comparison Confirmed  by Gwendolyn Grant  MD, Othello Dickenson ([id number]) on 01/06/2015 3:32:24 PM      MDM   Final diagnoses:   Community acquired pneumonia  Shortness of breath    60F presents with cough for past 5 days. EMS initiated duonebs en route for wheezing. Patient here febrile, tachycardic (was getting albuterol upon arrival). Sepsis protocol initiated.  Has had 5 days of general malaise with dysuria, cough, wheezing, abdominal pain with coughing. Here on exam lungs with diffusely decreased air movement. R CVA tenderess. No abdominal tenderness. I suspect like a pulmonary source of her fever, but will also check urine. CXR shows RML pneumonia on CXR. Antibiotics given. Admitted.  Elwin Mocha, MD 01/06/15 [phone]

## 2015-01-07 DIAGNOSIS — N179 Acute kidney failure, unspecified: Secondary | ICD-10-CM

## 2015-01-07 LAB — CBC
HCT: 26.8 % — ABNORMAL LOW (ref 36.0–46.0)
HEMOGLOBIN: 7.9 g/dL — AB (ref 12.0–15.0)
MCH: 25.6 pg — ABNORMAL LOW (ref 26.0–34.0)
MCHC: 29.5 g/dL — ABNORMAL LOW (ref 30.0–36.0)
MCV: 87 fL (ref 78.0–100.0)
PLATELETS: 413 10*3/uL — AB (ref 150–400)
RBC: 3.08 MIL/uL — ABNORMAL LOW (ref 3.87–5.11)
RDW: 17.5 % — ABNORMAL HIGH (ref 11.5–15.5)
WBC: 15.1 10*3/uL — AB (ref 4.0–10.5)

## 2015-01-07 LAB — LEGIONELLA ANTIGEN, URINE

## 2015-01-07 LAB — GLUCOSE, CAPILLARY
Glucose-Capillary: 153 mg/dL — ABNORMAL HIGH (ref 70–99)
Glucose-Capillary: 198 mg/dL — ABNORMAL HIGH (ref 70–99)
Glucose-Capillary: 143 mg/dL — ABNORMAL HIGH (ref 70–99)
Glucose-Capillary: 152 mg/dL — ABNORMAL HIGH (ref 70–99)

## 2015-01-07 LAB — BASIC METABOLIC PANEL
ANION GAP: 11 (ref 5–15)
Anion gap: 10 (ref 5–15)
BUN: 25 mg/dL — ABNORMAL HIGH (ref 6–23)
BUN: 27 mg/dL — ABNORMAL HIGH (ref 6–23)
CALCIUM: 9 mg/dL (ref 8.4–10.5)
CHLORIDE: 99 mmol/L (ref 96–112)
CO2: 26 mmol/L (ref 19–32)
CO2: 27 mmol/L (ref 19–32)
Calcium: 9.3 mg/dL (ref 8.4–10.5)
Chloride: 102 mmol/L (ref 96–112)
Creatinine, Ser: 1.77 mg/dL — ABNORMAL HIGH (ref 0.50–1.10)
Creatinine, Ser: 1.62 mg/dL — ABNORMAL HIGH (ref 0.50–1.10)
GFR calc Af Amer: 33 mL/min — ABNORMAL LOW (ref >90)
GFR calc Af Amer: 37 mL/min — ABNORMAL LOW (ref >90)
GFR calc non Af Amer: 32 mL/min — ABNORMAL LOW (ref >90)
GFR, EST NON AFRICAN AMERICAN: 29 mL/min — AB (ref >90)
Glucose, Bld: 212 mg/dL — ABNORMAL HIGH (ref 70–99)
Glucose, Bld: 157 mg/dL — ABNORMAL HIGH (ref 70–99)
Potassium: 5.4 mmol/L — ABNORMAL HIGH (ref 3.5–5.1)
Potassium: 4.9 mmol/L (ref 3.5–5.1)
SODIUM: 138 mmol/L (ref 135–145)
Sodium: 137 mmol/L (ref 135–145)

## 2015-01-07 MED ORDER — IRBESARTAN 150 MG PO TABS
75.0000 mg | ORAL_TABLET | Freq: Every day | ORAL | Status: DC
  Filled 2015-01-07: qty 1

## 2015-01-07 MED ORDER — ZOLPIDEM TARTRATE 5 MG PO TABS
5.0000 mg | ORAL_TABLET | Freq: Once | ORAL | Status: AC
  Administered 2015-01-07: 5 mg via ORAL
  Filled 2015-01-07: qty 1

## 2015-01-07 MED ORDER — IPRATROPIUM-ALBUTEROL 0.5-2.5 (3) MG/3ML IN SOLN
3.0000 mL | Freq: Four times a day (QID) | RESPIRATORY_TRACT | Status: DC
  Administered 2015-01-07 – 2015-01-09 (×8): 3 mL via RESPIRATORY_TRACT
  Filled 2015-01-07 (×8): qty 3

## 2015-01-07 MED ORDER — BUMETANIDE 2 MG PO TABS
2.0000 mg | ORAL_TABLET | Freq: Every day | ORAL | Status: DC
Start: 2015-01-08 — End: 2015-01-07

## 2015-01-07 MED ORDER — ZOLPIDEM TARTRATE 5 MG PO TABS
5.0000 mg | ORAL_TABLET | Freq: Every evening | ORAL | Status: DC | PRN
  Administered 2015-01-08 – 2015-01-09 (×3): 5 mg via ORAL
  Filled 2015-01-07 (×3): qty 1

## 2015-01-07 MED ORDER — SODIUM CHLORIDE 0.9 % IV SOLN
INTRAVENOUS | Status: DC
  Administered 2015-01-07 – 2015-01-08 (×2): via INTRAVENOUS

## 2015-01-07 NOTE — Progress Notes (Signed)
Inpatient Diabetes Program Recommendations  AACE/ADA: New Consensus Statement on Inpatient Glycemic Control (2013)  Target Ranges:  Prepandial:   less than 140 mg/dL      Peak postprandial:   less than 180 mg/dL (1-2 hours)      Critically ill patients:  140 - 180 mg/dL   Reason for Visit: Hyperglycemia  Results for MARCEIL, WELP (MRN [id number]) as of 01/07/2015 15:53  Ref. Range 01/06/2015 21:15 01/07/2015 07:48 01/07/2015 11:39  Glucose-Capillary Latest Range: 70-99 mg/dL 038 (H) 333 (H) 832 (H)   Hyperglycemia with steroids.  Inpatient Diabetes Program Recommendations Correction (SSI): Increase to Novolog moderate tidwc while on steroids HgbA1C: Check HgbA1C to assess glycemic control prior to hospitalization  Note: Will continue to follow. Thank you. Ailene Ards, RD, LDN, CDE Inpatient Diabetes Coordinator [phone]

## 2015-01-07 NOTE — Progress Notes (Signed)
Nutrition Brief Note  Patient identified on the Malnutrition Screening Tool (MST) Report  Wt Readings from Last 15 Encounters:  01/07/15 224 lb 13.9 oz (102 kg)  12/21/14 221 lb (100.245 kg)    Body mass index is 42.51 kg/(m^2). Patient meets criteria for morbid obesity based on current BMI.   Current diet order is heart healthy/carbohydrate modified, patient is consuming approximately 100% of meals at this time. Labs and medications reviewed.   No nutrition interventions warranted at this time. If nutrition issues arise, please consult RD.   Emmaline Kluver MS, RD, LDN [phone]

## 2015-01-07 NOTE — Progress Notes (Signed)
PROGRESS NOTE    Anita Gonzalez JKK:[id number] DOB: 09/24/1946 DOA: 01/06/2015 PCP: No primary care provider on file.  HPI/Brief narrative 69 y.o. female with history of DM 2, HTN, HLD, asthma, rheumatoid arthritis, former smoker, recently moved to this area from [place], presented to the ED with fever, chills, productive cough, dyspnea, wheezing, chest pain and poor appetite of ~ 1 week duration. In the ED, febrile to 102.73F, tachycardic, WBC 12.5 and chest x-ray suggestive of right lower lobe pneumonia.   Assessment/Plan:   1. Right lower lobe community acquired pneumonia: Continue IV Rocephin and azithromycin. Follow blood cultures, urine Legionella and streptococcal antigen results. 2. Sepsis due to pneumonia: Present on admission. IV fluids and antibiotics as above. Hemodynamically stable. Sepsis physiology improved. 3. Asthma exacerbation: Precipitated by pneumonia. Improved after bronchodilators and a dose of Solu-Medrol in ED. Continue oxygen, bronchodilators, Dulera and brief IV Solu-Medrol. Improving. 4. Acute kidney injury: Unclear etiology. Held NSAIDs, diuretics, ACEI-ARB. IV fluids. Follow BMP in a.m. 5. Mild dehydration: Secondary to poor oral intake and sepsis. Brief IV fluids. Hold diuretics. Improving but continue additional day of IV fluid secondary to acute kidney injury. 6. Essential hypertension: Controlled. Continue home medications except diuretics. 7. Type II DM: Controlled. Hold metformin. SSI. 8. Rheumatoid arthritis: Patient takes scheduled NSAID/ibuprofen-DC'd secondary to acute kidney injury.  9. Anemia: May be secondary to chronic disease. No prior labs to compare. Follow CBC in a.m. No reported bleeding. Transfuse if hemoglobin less than 7 g per DL. 10. History of HLD   Code Status: Full Family Communication: None at bedside Disposition Plan: Home when medically stable.   Consultants:  None  Procedures:  None  Antibiotics:  IV Rocephin  IV  azithromycin   Subjective: Feels better. Dyspnea improved but not at baseline. Mild dry cough-unable to bring up sputum.  Objective: Filed Vitals:   01/07/15 0617 01/07/15 0916 01/07/15 1321 01/07/15 1540  BP:   106/81   Pulse:   85   Temp:   98.2 F (36.8 C)   TempSrc:   Oral   Resp:   18   Height:      Weight: 102 kg (224 lb 13.9 oz)     SpO2:  88% 90% 98%    Intake/Output Summary (Last 24 hours) at 01/07/15 1756 Last data filed at 01/07/15 1522  Gross per 24 hour  Intake    240 ml  Output    450 ml  Net   -210 ml   Filed Weights   01/06/15 1830 01/07/15 0617  Weight: 101 kg (222 lb 10.6 oz) 102 kg (224 lb 13.9 oz)     Exam:  General exam: Pleasant middle-aged female seen sitting up eating breakfast this morning. Respiratory system: Improved breath sounds bilaterally. Occasional scattered bilateral rhonchi and basal crackles. No increased work of breathing. Cardiovascular system: S1 & S2 heard, RRR. No JVD, murmurs, gallops, clicks or pedal edema. Telemetry: Sinus rhythm. Gastrointestinal system: Abdomen is nondistended, soft and nontender. Normal bowel sounds heard. Central nervous system: Alert and oriented. No focal neurological deficits. Extremities: Symmetric 5 x 5 power.   Data Reviewed: Basic Metabolic Panel:  Recent Labs Lab 01/06/15 1531 01/07/15 0505 01/07/15 0939  NA 137 138 137  K 4.7 5.4* 4.9  CL 100 102 99  CO2 27 26 27   GLUCOSE 97 212* 157*  BUN 14 25* 27*  CREATININE 1.17* 1.77* 1.62*  CALCIUM 9.0 9.0 9.3   Liver Function Tests:  Recent Labs Lab 01/06/15 1531  AST 29  ALT 21  ALKPHOS 90  BILITOT 0.6  PROT 7.4  ALBUMIN 3.6   No results for input(s): LIPASE, AMYLASE in the last 168 hours. No results for input(s): AMMONIA in the last 168 hours. CBC:  Recent Labs Lab 01/06/15 1531 01/07/15 0505  WBC 12.5* 15.1*  HGB 8.3* 7.9*  HCT 27.7* 26.8*  MCV 86.3 87.0  PLT 355 413*   Cardiac Enzymes: No results for input(s):  CKTOTAL, CKMB, CKMBINDEX, TROPONINI in the last 168 hours. BNP (last 3 results) No results for input(s): PROBNP in the last 8760 hours. CBG:  Recent Labs Lab 01/06/15 2115 01/07/15 0748 01/07/15 1139 01/07/15 1705  GLUCAP 245* 153* 198* 143*    Recent Results (from the past 240 hour(s))  Blood culture (routine x 2)     Status: None (Preliminary result)   Collection Time: 01/06/15  3:31 PM  Result Value Ref Range Status   Specimen Description BLOOD LEFT ARM  Final   Special Requests   Final    BOTTLES DRAWN AEROBIC AND ANAEROBIC BOTH BOTTLES   Culture   Final           BLOOD CULTURE RECEIVED NO GROWTH TO DATE CULTURE WILL BE HELD FOR 5 DAYS BEFORE ISSUING A FINAL NEGATIVE REPORT Performed at [facility]    Report Status PENDING  Incomplete  Blood culture (routine x 2)     Status: None (Preliminary result)   Collection Time: 01/06/15  3:47 PM  Result Value Ref Range Status   Specimen Description BLOOD RIGHT HAND  Final   Special Requests   Final    BOTTLES DRAWN AEROBIC AND ANAEROBIC 5CC AEROBIC AND 2CC ANAEROBIC   Culture   Final           BLOOD CULTURE RECEIVED NO GROWTH TO DATE CULTURE WILL BE HELD FOR 5 DAYS BEFORE ISSUING A FINAL NEGATIVE REPORT Performed at [facility]    Report Status PENDING  Incomplete  Culture, expectorated sputum-assessment     Status: None   Collection Time: 01/06/15  8:38 PM  Result Value Ref Range Status   Specimen Description SPUTUM  Final   Special Requests NONE  Final   Sputum evaluation   Final    THIS SPECIMEN IS ACCEPTABLE. RESPIRATORY CULTURE REPORT TO FOLLOW.   Report Status 01/06/2015 FINAL  Final  Culture, respiratory (NON-Expectorated)     Status: None (Preliminary result)   Collection Time: 01/06/15  8:38 PM  Result Value Ref Range Status   Specimen Description SPUTUM  Final   Special Requests NONE  Final   Gram Stain   Final    ABUNDANT WBC PRESENT,BOTH PMN AND MONONUCLEAR RARE SQUAMOUS EPITHELIAL  CELLS PRESENT NO ORGANISMS SEEN Performed at [facility]    Culture PENDING  Incomplete   Report Status PENDING  Incomplete            Studies: Dg Chest Portable 1 View  01/06/2015   CLINICAL DATA:  One week history of difficulty breathing  EXAM: PORTABLE CHEST - 1 VIEW  COMPARISON:  None.  FINDINGS: There is airspace consolidation in the right lower lobe. Lungs elsewhere clear. Heart size and pulmonary vascularity are normal. No adenopathy. No bone lesions.  IMPRESSION: Right lower lobe airspace consolidation.   Electronically Signed   By: Bretta Bang III M.D.   On: 01/06/2015 16:03        Scheduled Meds: . azithromycin  500 mg Intravenous Q24H  . [START ON  01/08/2015] bumetanide  2 mg Oral Daily  . cefTRIAXone (ROCEPHIN)  IV  1 g Intravenous Q24H  . clobetasol ointment   Topical BID  . cyclobenzaprine  10 mg Oral Daily  . diclofenac sodium  2 g Topical BID  . enoxaparin (LOVENOX) injection  50 mg Subcutaneous Q24H  . folic acid  1 mg Oral Daily  . guaiFENesin  600 mg Oral BID  . insulin aspart  0-9 Units Subcutaneous TID WC  . ipratropium-albuterol  3 mL Nebulization Q6H  . [START ON 01/08/2015] irbesartan  75 mg Oral Daily  . methylPREDNISolone (SOLU-MEDROL) injection  40 mg Intravenous BID  . mometasone-formoterol  2 puff Inhalation BID  . montelukast  10 mg Oral QHS  . pantoprazole  40 mg Oral Daily  . simvastatin  20 mg Oral QHS  . sodium chloride  3 mL Intravenous Q12H  . verapamil  240 mg Oral Daily   Continuous Infusions: . sodium chloride 75 mL/hr at 01/07/15 1219    Principal Problem:   Community acquired pneumonia-RLL Active Problems:   Sepsis due to pneumonia   Asthma exacerbation   Anemia   Essential hypertension   DM II (diabetes mellitus, type II), controlled   HLD (hyperlipidemia)   Rheumatoid arthritis   CAP (community acquired pneumonia)    Time spent: 40 minutes    HONGALGI,ANAND, MD, FACP, FHM. [facility] Pager [phone]  If 7PM-7AM, please contact night-coverage [email or web address] Password [facility] 01/07/2015, 5:56 PM    LOS: 1 day

## 2015-01-07 NOTE — Progress Notes (Signed)
PT Cancellation Note  Patient Details Name: Anita Gonzalez MRN: [id number] DOB: 1945/12/04   Cancelled Treatment:     PT eval attempted.  Pt politely declined to participate 2* fatigue and promptly fell asleep.  Agreeable to return in am.    Krupa Stege 01/07/2015, 3:49 PM

## 2015-01-08 ENCOUNTER — Telehealth: Payer: Self-pay | Admitting: Hematology

## 2015-01-08 DIAGNOSIS — D509 Iron deficiency anemia, unspecified: Secondary | ICD-10-CM

## 2015-01-08 DIAGNOSIS — N189 Chronic kidney disease, unspecified: Secondary | ICD-10-CM

## 2015-01-08 LAB — IRON AND TIBC
Iron: 13 ug/dL — ABNORMAL LOW (ref 42–145)
Saturation Ratios: 3 % — ABNORMAL LOW (ref 20–55)
TIBC: 443 ug/dL (ref 250–470)
UIBC: 430 ug/dL — ABNORMAL HIGH (ref 125–400)

## 2015-01-08 LAB — BASIC METABOLIC PANEL
ANION GAP: 9 (ref 5–15)
ANION GAP: 5 (ref 5–15)
BUN: 30 mg/dL — AB (ref 6–23)
BUN: 30 mg/dL — ABNORMAL HIGH (ref 6–23)
CHLORIDE: 101 mmol/L (ref 96–112)
CO2: 29 mmol/L (ref 19–32)
CO2: 28 mmol/L (ref 19–32)
CREATININE: 1.29 mg/dL — AB (ref 0.50–1.10)
Calcium: 9.1 mg/dL (ref 8.4–10.5)
Calcium: 8.9 mg/dL (ref 8.4–10.5)
Chloride: 103 mmol/L (ref 96–112)
Creatinine, Ser: 1.41 mg/dL — ABNORMAL HIGH (ref 0.50–1.10)
GFR calc non Af Amer: 38 mL/min — ABNORMAL LOW (ref >90)
GFR calc non Af Amer: 42 mL/min — ABNORMAL LOW (ref >90)
GFR, EST AFRICAN AMERICAN: 44 mL/min — AB (ref >90)
GFR, EST AFRICAN AMERICAN: 49 mL/min — AB (ref >90)
Glucose, Bld: 148 mg/dL — ABNORMAL HIGH (ref 70–99)
Glucose, Bld: 122 mg/dL — ABNORMAL HIGH (ref 70–99)
POTASSIUM: 4.9 mmol/L (ref 3.5–5.1)
Potassium: 5.4 mmol/L — ABNORMAL HIGH (ref 3.5–5.1)
SODIUM: 136 mmol/L (ref 135–145)
Sodium: 139 mmol/L (ref 135–145)

## 2015-01-08 LAB — CBC
HEMATOCRIT: 25.2 % — AB (ref 36.0–46.0)
HEMOGLOBIN: 7.4 g/dL — AB (ref 12.0–15.0)
MCH: 25.6 pg — ABNORMAL LOW (ref 26.0–34.0)
MCHC: 29.4 g/dL — AB (ref 30.0–36.0)
MCV: 87.2 fL (ref 78.0–100.0)
Platelets: 397 10*3/uL (ref 150–400)
RBC: 2.89 MIL/uL — ABNORMAL LOW (ref 3.87–5.11)
RDW: 17.5 % — ABNORMAL HIGH (ref 11.5–15.5)
WBC: 18.5 10*3/uL — ABNORMAL HIGH (ref 4.0–10.5)

## 2015-01-08 LAB — URINE CULTURE
Colony Count: 25000
Special Requests: NORMAL

## 2015-01-08 LAB — GLUCOSE, CAPILLARY
GLUCOSE-CAPILLARY: 131 mg/dL — AB (ref 70–99)
GLUCOSE-CAPILLARY: 171 mg/dL — AB (ref 70–99)
Glucose-Capillary: 121 mg/dL — ABNORMAL HIGH (ref 70–99)
Glucose-Capillary: 147 mg/dL — ABNORMAL HIGH (ref 70–99)

## 2015-01-08 LAB — FOLATE

## 2015-01-08 LAB — VITAMIN B12: Vitamin B-12: 728 pg/mL (ref 211–911)

## 2015-01-08 LAB — RETICULOCYTES
RBC.: 3.04 MIL/uL — ABNORMAL LOW (ref 3.87–5.11)
Retic Count, Absolute: 100.3 10*3/uL (ref 19.0–186.0)
Retic Ct Pct: 3.3 % — ABNORMAL HIGH (ref 0.4–3.1)

## 2015-01-08 LAB — FERRITIN: Ferritin: 16 ng/mL (ref 10–291)

## 2015-01-08 MED ORDER — GUAIFENESIN ER 600 MG PO TB12
1200.0000 mg | ORAL_TABLET | Freq: Two times a day (BID) | ORAL | Status: DC
  Administered 2015-01-08 – 2015-01-11 (×6): 1200 mg via ORAL
  Filled 2015-01-08 (×6): qty 2

## 2015-01-08 NOTE — Evaluation (Signed)
Physical Therapy Evaluation Patient Details Name: Anita Gonzalez MRN: [id number] DOB: 03-13-1946 Today's Date: 01/08/2015   History of Present Illness  69 y.o. female with history of DM 2, HTN, HLD, asthma, rheumatoid arthritis, former smoker, recently moved to this area from [place], presented to the ED with fever, chills, productive cough, dyspnea, wheezing, chest pain and poor appetite of ~ 1 week duration. In the ED, febrile to 102.45F, tachycardic, WBC 12.5 and chest x-ray suggestive of right lower lobe pneumonia.  Clinical Impression  Pt admitted with above diagnosis. Pt currently with functional limitations due to the deficits listed below (see PT Problem List).  Pt will benefit from skilled PT to increase their independence and safety with mobility to allow discharge to the venue listed below.    Pt ambulated 70' with RW, SaO2 87-88% on RA with walking, distance limited by fatigue/SOB. HHPT and 4 wheeled RW with seat recommended. Will follow during acute stay. *    Follow Up Recommendations Home health PT    Equipment Recommendations  Other (comment) (Rollator)    Recommendations for Other Services       Precautions / Restrictions Precautions Precautions: Fall Precaution Comments: monitor O2 Restrictions Weight Bearing Restrictions: No      Mobility  Bed Mobility Overal bed mobility: Modified Independent                Transfers Overall transfer level: Modified independent                  Ambulation/Gait Ambulation/Gait assistance: Supervision Ambulation Distance (Feet): 70 Feet Assistive device: Rolling walker (2 wheeled) Gait Pattern/deviations: Step-through pattern   Gait velocity interpretation: at or above normal speed for age/gender General Gait Details: steady with RW, SaO2 87-89% on RA walking with 2-3/4 dyspnea  Stairs            Wheelchair Mobility    Modified Rankin (Stroke Patients Only)       Balance Overall balance  assessment: Modified Independent                                           Pertinent Vitals/Pain Pain Assessment: No/denies pain    Home Living Family/patient expects to be discharged to:: Private residence Living Arrangements: Children           Home Layout: One level Home Equipment: Cane - single point      Prior Function Level of Independence: Independent with assistive device(s)         Comments: uses cane, walked short distances only due to SOB     Hand Dominance        Extremity/Trunk Assessment   Upper Extremity Assessment: Overall WFL for tasks assessed           Lower Extremity Assessment: Overall WFL for tasks assessed      Cervical / Trunk Assessment: Normal  Communication   Communication: No difficulties  Cognition Arousal/Alertness: Awake/alert Behavior During Therapy: WFL for tasks assessed/performed Overall Cognitive Status: Within Functional Limits for tasks assessed                      General Comments      Exercises        Assessment/Plan    PT Assessment Patient needs continued PT services  PT Diagnosis Generalized weakness   PT Problem List Decreased activity tolerance;Cardiopulmonary status limiting activity;Decreased  mobility  PT Treatment Interventions DME instruction;Gait training;Functional mobility training;Therapeutic activities;Therapeutic exercise   PT Goals (Current goals can be found in the Care Plan section) Acute Rehab PT Goals Patient Stated Goal: to be able to walk farther PT Goal Formulation: With patient/family Time For Goal Achievement: 01/22/15 Potential to Achieve Goals: Good    Frequency Min 3X/week   Barriers to discharge        Co-evaluation               End of Session Equipment Utilized During Treatment: Gait belt Activity Tolerance: Patient limited by fatigue Patient left: in chair;with call bell/phone within reach;with family/visitor present Nurse  Communication: Mobility status         Time: 0903-0930 PT Time Calculation (min) (ACUTE ONLY): 27 min   Charges:   PT Evaluation $Initial PT Evaluation Tier I: 1 Procedure PT Treatments $Gait Training: 8-22 mins   PT G Codes:        Tamala Ser 01/08/2015, 9:39 AM [phone]

## 2015-01-08 NOTE — Progress Notes (Signed)
INITIAL NUTRITION ASSESSMENT  DOCUMENTATION CODES Per approved criteria  -Morbid Obesity   INTERVENTION: Provided education on low potassium and CHO modified diet  NUTRITION DIAGNOSIS: Food and nutrition related knowledge deficit related to lack of prior education as evidenced by pt report of not knowing high potassium foods.   Goal: Pt to meet >/= 90% of their estimated nutrition needs   Monitor:  PO and supplemental intake, weight, labs, I/O's  Reason for Assessment: Consult for nutritional assessment  Admitting Dx: Community acquired pneumonia  ASSESSMENT: 69 y.o. female with history of DM 2, HTN, HLD, asthma, rheumatoid arthritis, former smoker, recently moved to this area from [place], presented to the ED with fever, chills, productive cough, dyspnea, wheezing, chest pain and poor appetite of ~ 1 week duration.   Pt with adequate PO intake (75-80%) and no weight loss.    Pt states she has a lot of questions about her diet. Pt's daughter present in the room.   RD provided 'Type 2 Diabetes Nutrition Therapy" handout.   Explained why diet restrictions are needed and provided lists of foods to limit/avoid that are high potassium. Provided specific recommendations on safer alternatives of these foods. Strongly encouraged compliance of this diet.   Discussed importance of protein intake at each meal and snack.  Teach back method used.  Expect good compliance with assistance from family.  Labs reviewed: Elevated BUN & Creatinine  Height: Ht Readings from Last 1 Encounters:  01/06/15 5\' 1"  (1.549 m)    Weight: Wt Readings from Last 1 Encounters:  01/08/15 225 lb 8 oz (102.286 kg)    Ideal Body Weight: 105 lb  % Ideal Body Weight: 214%  Wt Readings from Last 10 Encounters:  01/08/15 225 lb 8 oz (102.286 kg)  12/21/14 221 lb (100.245 kg)    Usual Body Weight: 220 lb  % Usual Body Weight: 100%  BMI:  Body mass index is 42.63 kg/(m^2).  Estimated  Nutritional Needs: Kcal: 1500-1700 Protein: 70-80g Fluid: 1.5L/day  Skin: intact  Diet Order: Diet renal/carb modified with 1200 ml fluid restriction  EDUCATION NEEDS: -Education needs addressed   Intake/Output Summary (Last 24 hours) at 01/08/15 1047 Last data filed at 01/08/15 0500  Gross per 24 hour  Intake 1731.25 ml  Output    575 ml  Net 1156.25 ml    Last BM: 2/23  Labs:   Recent Labs Lab 01/07/15 0939 01/08/15 0525 01/08/15 0840  NA 137 139 136  K 4.9 5.4* 4.9  CL 99 101 103  CO2 27 29 28   BUN 27* 30* 30*  CREATININE 1.62* 1.41* 1.29*  CALCIUM 9.3 9.1 8.9  GLUCOSE 157* 148* 122*    CBG (last 3)   Recent Labs  01/07/15 1705 01/07/15 2135 01/08/15 0726  GLUCAP 143* 152* 131*    Scheduled Meds: . azithromycin  500 mg Intravenous Q24H  . cefTRIAXone (ROCEPHIN)  IV  1 g Intravenous Q24H  . clobetasol ointment   Topical BID  . cyclobenzaprine  10 mg Oral Daily  . diclofenac sodium  2 g Topical BID  . folic acid  1 mg Oral Daily  . guaiFENesin  1,200 mg Oral BID  . insulin aspart  0-9 Units Subcutaneous TID WC  . ipratropium-albuterol  3 mL Nebulization Q6H  . methylPREDNISolone (SOLU-MEDROL) injection  40 mg Intravenous BID  . mometasone-formoterol  2 puff Inhalation BID  . montelukast  10 mg Oral QHS  . pantoprazole  40 mg Oral Daily  . simvastatin  20 mg Oral QHS  . sodium chloride  3 mL Intravenous Q12H  . verapamil  240 mg Oral Daily    Continuous Infusions:   Past Medical History  Diagnosis Date  . Diabetes mellitus without complication   . Asthma   . Arthritis   . Hypertension     Past Surgical History  Procedure Laterality Date  . Cesarean section    . Hernia repair    . Hemorrhoid surgery      Tilda Franco, MS, RD, LDN Pager: [phone] After Hours Pager: [phone]

## 2015-01-08 NOTE — Progress Notes (Signed)
DME ordered from [facility] to be delivered to pt's home.

## 2015-01-08 NOTE — Progress Notes (Signed)
PROGRESS NOTE    Anita Gonzalez DPO:[id number] DOB: 09/24/1946 DOA: 01/06/2015 PCP: No primary care provider on file.  HPI/Brief narrative 69 y.o. female with history of DM 2, HTN, HLD, asthma, rheumatoid arthritis, former smoker, recently moved to this area from [place], presented to the ED with fever, chills, productive cough, dyspnea, wheezing, chest pain and poor appetite of ~ 1 week duration. In the ED, febrile to 102.13F, tachycardic, WBC 12.5 and chest x-ray suggestive of right lower lobe pneumonia.   Assessment/Plan:   1. Right lower lobe community acquired pneumonia: Continue IV Rocephin and azithromycin. Blood cultures 2: Negative to date. Urine Legionella and streptococcal antigen : Negative. Sputum culture: Normal OP flora. 2. Sepsis due to pneumonia: Present on admission. IV fluids and antibiotics as above. Hemodynamically stable. Sepsis physiology resolved. 3. Asthma exacerbation: Precipitated by pneumonia. Improved after bronchodilators and a dose of Solu-Medrol in ED. Continue oxygen, bronchodilators, Dulera and brief IV Solu-Medrol. Improving. 4. Acute kidney injury: Unclear etiology. Held NSAIDs, diuretics, ACEI-ARB. IV fluids. Follow BMP in a.m. improving. Creatinine 1.29. (Intermittent high K reported on BMP but repeat labs show normal K) 5. Mild dehydration: Secondary to poor oral intake and sepsis. Hold diuretics. Improved. DC IV fluids. 6. Essential hypertension: Controlled. Continue home medications except diuretics. 7. Type II DM: Controlled. Hold metformin. SSI. 8. Rheumatoid arthritis: Patient takes scheduled NSAID/ibuprofen-DC'd secondary to acute kidney injury.  9. Iron deficiency Anemia: No prior labs to compare. No reported bleeding or melena. Transfuse if hemoglobin less than 7 g per DL. Ferritin 16. Will give IV iron. Patient states that she had anemia workup including EGD and colonoscopy in [facility] last year-request records. 10. History of HLD   Code Status:  Full Family Communication: Discussed with daughter at bedside. Disposition Plan: Home when medically stable.   Consultants:  None  Procedures:  None  Antibiotics:  IV Rocephin  IV azithromycin   Subjective: Nonproductive cough-unable to bring up sputum. Dyspnea improved but not at baseline. Urinating well.  Objective: Filed Vitals:   01/08/15 0803 01/08/15 0934 01/08/15 1425 01/08/15 1427  BP:    101/33  Pulse:    74  Temp:    98.4 F (36.9 C)  TempSrc:    Oral  Resp:    19  Height:      Weight:      SpO2: 96% 87% 100% 100%    Intake/Output Summary (Last 24 hours) at 01/08/15 1728 Last data filed at 01/08/15 1429  Gross per 24 hour  Intake   2205 ml  Output   1375 ml  Net    830 ml   Filed Weights   01/06/15 1830 01/07/15 0617 01/08/15 0443  Weight: 101 kg (222 lb 10.6 oz) 102 kg (224 lb 13.9 oz) 102.286 kg (225 lb 8 oz)     Exam:  General exam: Pleasant middle-aged female seen sitting up eating breakfast this morning. Respiratory system: Occasional scattered bilateral rhonchi and basal crackles. No increased work of breathing. Cardiovascular system: S1 & S2 heard, RRR. No JVD, murmurs, gallops, clicks or pedal edema. Telemetry: Sinus rhythm. Gastrointestinal system: Abdomen is nondistended, soft and nontender. Normal bowel sounds heard. Central nervous system: Alert and oriented. No focal neurological deficits. Extremities: Symmetric 5 x 5 power.   Data Reviewed: Basic Metabolic Panel:  Recent Labs Lab 01/06/15 1531 01/07/15 0505 01/07/15 0939 01/08/15 0525 01/08/15 0840  NA 137 138 137 139 136  K 4.7 5.4* 4.9 5.4* 4.9  CL 100 102 99 101 103  CO2 27 26 27 29 28   GLUCOSE 97 212* 157* 148* 122*  BUN 14 25* 27* 30* 30*  CREATININE 1.17* 1.77* 1.62* 1.41* 1.29*  CALCIUM 9.0 9.0 9.3 9.1 8.9   Liver Function Tests:  Recent Labs Lab 01/06/15 1531  AST 29  ALT 21  ALKPHOS 90  BILITOT 0.6  PROT 7.4  ALBUMIN 3.6   No results for  input(s): LIPASE, AMYLASE in the last 168 hours. No results for input(s): AMMONIA in the last 168 hours. CBC:  Recent Labs Lab 01/06/15 1531 01/07/15 0505 01/08/15 0525  WBC 12.5* 15.1* 18.5*  HGB 8.3* 7.9* 7.4*  HCT 27.7* 26.8* 25.2*  MCV 86.3 87.0 87.2  PLT 355 413* 397   Cardiac Enzymes: No results for input(s): CKTOTAL, CKMB, CKMBINDEX, TROPONINI in the last 168 hours. BNP (last 3 results) No results for input(s): PROBNP in the last 8760 hours. CBG:  Recent Labs Lab 01/07/15 1705 01/07/15 2135 01/08/15 0726 01/08/15 1139 01/08/15 1621  GLUCAP 143* 152* 131* 121* 147*    Recent Results (from the past 240 hour(s))  Blood culture (routine x 2)     Status: None (Preliminary result)   Collection Time: 01/06/15  3:31 PM  Result Value Ref Range Status   Specimen Description BLOOD LEFT ARM  Final   Special Requests   Final    BOTTLES DRAWN AEROBIC AND ANAEROBIC 01/08/15 BOTH BOTTLES   Culture   Final           BLOOD CULTURE RECEIVED NO GROWTH TO DATE CULTURE WILL BE HELD FOR 5 DAYS BEFORE ISSUING A FINAL NEGATIVE REPORT Performed at    Report Status PENDING  Incomplete  Blood culture (routine x 2)     Status: None (Preliminary result)   Collection Time: 01/06/15  3:47 PM  Result Value Ref Range Status   Specimen Description BLOOD RIGHT HAND  Final   Special Requests   Final    BOTTLES DRAWN AEROBIC AND ANAEROBIC 5CC AEROBIC AND 2CC ANAEROBIC   Culture   Final           BLOOD CULTURE RECEIVED NO GROWTH TO DATE CULTURE WILL BE HELD FOR 5 DAYS BEFORE ISSUING A FINAL NEGATIVE REPORT Performed at 01/08/15    Report Status PENDING  Incomplete  Urine culture     Status: None   Collection Time: 01/06/15  4:15 PM  Result Value Ref Range Status   Specimen Description URINE, CLEAN CATCH  Final   Special Requests Normal  Final   Colony Count   Final    25,000 COLONIES/ML Performed at 01/08/15    Culture   Final    ESCHERICHIA  COLI Performed at [facility]    Report Status 01/08/2015 FINAL  Final   Organism ID, Bacteria ESCHERICHIA COLI  Final      Susceptibility   Escherichia coli - MIC*    AMPICILLIN >=32 RESISTANT Resistant     CEFAZOLIN <=4 SENSITIVE Sensitive     CEFTRIAXONE <=1 SENSITIVE Sensitive     CIPROFLOXACIN >=4 RESISTANT Resistant     GENTAMICIN <=1 SENSITIVE Sensitive     LEVOFLOXACIN >=8 RESISTANT Resistant     NITROFURANTOIN <=16 SENSITIVE Sensitive     TOBRAMYCIN <=1 SENSITIVE Sensitive     TRIMETH/SULFA <=20 SENSITIVE Sensitive     PIP/TAZO <=4 SENSITIVE Sensitive     * ESCHERICHIA COLI  Culture, expectorated sputum-assessment     Status: None   Collection Time:  01/06/15  8:38 PM  Result Value Ref Range Status   Specimen Description SPUTUM  Final   Special Requests NONE  Final   Sputum evaluation   Final    THIS SPECIMEN IS ACCEPTABLE. RESPIRATORY CULTURE REPORT TO FOLLOW.   Report Status 01/06/2015 FINAL  Final  Culture, respiratory (NON-Expectorated)     Status: None (Preliminary result)   Collection Time: 01/06/15  8:38 PM  Result Value Ref Range Status   Specimen Description SPUTUM  Final   Special Requests NONE  Final   Gram Stain   Final    ABUNDANT WBC PRESENT,BOTH PMN AND MONONUCLEAR RARE SQUAMOUS EPITHELIAL CELLS PRESENT NO ORGANISMS SEEN Performed at [facility]    Culture   Final    NORMAL OROPHARYNGEAL FLORA Performed at [facility]    Report Status PENDING  Incomplete            Studies: No results found.      Scheduled Meds: . azithromycin  500 mg Intravenous Q24H  . cefTRIAXone (ROCEPHIN)  IV  1 g Intravenous Q24H  . clobetasol ointment   Topical BID  . cyclobenzaprine  10 mg Oral Daily  . diclofenac sodium  2 g Topical BID  . folic acid  1 mg Oral Daily  . guaiFENesin  1,200 mg Oral BID  . insulin aspart  0-9 Units Subcutaneous TID WC  . ipratropium-albuterol  3 mL Nebulization Q6H  . methylPREDNISolone  (SOLU-MEDROL) injection  40 mg Intravenous BID  . mometasone-formoterol  2 puff Inhalation BID  . montelukast  10 mg Oral QHS  . pantoprazole  40 mg Oral Daily  . simvastatin  20 mg Oral QHS  . sodium chloride  3 mL Intravenous Q12H  . verapamil  240 mg Oral Daily   Continuous Infusions:    Principal Problem:   Community acquired pneumonia-RLL Active Problems:   Sepsis due to pneumonia   Asthma exacerbation   Anemia   Essential hypertension   DM II (diabetes mellitus, type II), controlled   HLD (hyperlipidemia)   Rheumatoid arthritis   CAP (community acquired pneumonia)    Time spent: 30 minutes    Jonika Critz, MD, FACP, FHM. [facility] Pager [phone]  If 7PM-7AM, please contact night-coverage [email or web address] Password [email or web address] 01/08/2015, 5:28 PM    LOS: 2 days

## 2015-01-08 NOTE — Telephone Encounter (Signed)
01/08/15 Called pt to attempt to confirm appointment.  Unable to leave message on number provided due to voicemail not being set up.  TG

## 2015-01-09 DIAGNOSIS — I471 Supraventricular tachycardia: Secondary | ICD-10-CM

## 2015-01-09 LAB — CBC
HCT: 26.2 % — ABNORMAL LOW (ref 36.0–46.0)
HEMOGLOBIN: 7.5 g/dL — AB (ref 12.0–15.0)
MCH: 25.3 pg — ABNORMAL LOW (ref 26.0–34.0)
MCHC: 28.6 g/dL — AB (ref 30.0–36.0)
MCV: 88.5 fL (ref 78.0–100.0)
Platelets: 454 10*3/uL — ABNORMAL HIGH (ref 150–400)
RBC: 2.96 MIL/uL — AB (ref 3.87–5.11)
RDW: 17.5 % — ABNORMAL HIGH (ref 11.5–15.5)
WBC: 16.3 10*3/uL — ABNORMAL HIGH (ref 4.0–10.5)

## 2015-01-09 LAB — TYPE AND SCREEN
ABO/RH(D): O POS
Antibody Screen: NEGATIVE

## 2015-01-09 LAB — TSH: TSH: 0.288 u[IU]/mL — ABNORMAL LOW (ref 0.350–4.500)

## 2015-01-09 LAB — BASIC METABOLIC PANEL
ANION GAP: 4 — AB (ref 5–15)
BUN: 32 mg/dL — ABNORMAL HIGH (ref 6–23)
CO2: 30 mmol/L (ref 19–32)
Calcium: 9 mg/dL (ref 8.4–10.5)
Chloride: 104 mmol/L (ref 96–112)
Creatinine, Ser: 1.13 mg/dL — ABNORMAL HIGH (ref 0.50–1.10)
GFR calc non Af Amer: 50 mL/min — ABNORMAL LOW (ref >90)
GFR, EST AFRICAN AMERICAN: 57 mL/min — AB (ref >90)
Glucose, Bld: 133 mg/dL — ABNORMAL HIGH (ref 70–99)
POTASSIUM: 5 mmol/L (ref 3.5–5.1)
SODIUM: 138 mmol/L (ref 135–145)

## 2015-01-09 LAB — CULTURE, RESPIRATORY W GRAM STAIN: Culture: NORMAL

## 2015-01-09 LAB — CULTURE, RESPIRATORY

## 2015-01-09 LAB — GLUCOSE, CAPILLARY
Glucose-Capillary: 120 mg/dL — ABNORMAL HIGH (ref 70–99)
Glucose-Capillary: 113 mg/dL — ABNORMAL HIGH (ref 70–99)
Glucose-Capillary: 120 mg/dL — ABNORMAL HIGH (ref 70–99)
Glucose-Capillary: 183 mg/dL — ABNORMAL HIGH (ref 70–99)

## 2015-01-09 LAB — ABO/RH: ABO/RH(D): O POS

## 2015-01-09 MED ORDER — IPRATROPIUM BROMIDE 0.02 % IN SOLN
0.5000 mg | Freq: Four times a day (QID) | RESPIRATORY_TRACT | Status: DC
  Administered 2015-01-09 – 2015-01-10 (×3): 0.5 mg via RESPIRATORY_TRACT
  Filled 2015-01-09 (×3): qty 2.5

## 2015-01-09 MED ORDER — LEVALBUTEROL HCL 0.63 MG/3ML IN NEBU
0.6300 mg | INHALATION_SOLUTION | Freq: Four times a day (QID) | RESPIRATORY_TRACT | Status: DC | PRN
  Filled 2015-01-09: qty 3

## 2015-01-09 MED ORDER — LIP MEDEX EX OINT
TOPICAL_OINTMENT | CUTANEOUS | Status: DC | PRN
  Filled 2015-01-09: qty 7

## 2015-01-09 MED ORDER — BUMETANIDE 2 MG PO TABS
2.0000 mg | ORAL_TABLET | Freq: Every day | ORAL | Status: DC
  Administered 2015-01-09 – 2015-01-10 (×2): 2 mg via ORAL
  Filled 2015-01-09 (×3): qty 1

## 2015-01-09 MED ORDER — PREDNISONE 20 MG PO TABS
40.0000 mg | ORAL_TABLET | Freq: Every day | ORAL | Status: DC
  Administered 2015-01-10 – 2015-01-11 (×2): 40 mg via ORAL
  Filled 2015-01-09 (×3): qty 2

## 2015-01-09 MED ORDER — BISACODYL 10 MG RE SUPP
10.0000 mg | Freq: Every day | RECTAL | Status: DC | PRN
Start: 2015-01-09 — End: 2015-01-11

## 2015-01-09 MED ORDER — SENNA 8.6 MG PO TABS
2.0000 | ORAL_TABLET | Freq: Every day | ORAL | Status: DC
  Administered 2015-01-09 – 2015-01-11 (×3): 17.2 mg via ORAL
  Filled 2015-01-09 (×3): qty 2

## 2015-01-09 MED ORDER — METOPROLOL TARTRATE 1 MG/ML IV SOLN
5.0000 mg | Freq: Once | INTRAVENOUS | Status: AC
  Administered 2015-01-09: 5 mg via INTRAVENOUS
  Filled 2015-01-09: qty 5

## 2015-01-09 MED ORDER — LEVALBUTEROL HCL 0.63 MG/3ML IN NEBU
0.6300 mg | INHALATION_SOLUTION | Freq: Four times a day (QID) | RESPIRATORY_TRACT | Status: DC
  Administered 2015-01-09 – 2015-01-10 (×3): 0.63 mg via RESPIRATORY_TRACT
  Filled 2015-01-09 (×2): qty 3

## 2015-01-09 NOTE — Progress Notes (Signed)
PROGRESS NOTE    Anita Gonzalez YIA:[id number] DOB: 14-Jul-1946 DOA: 01/06/2015 PCP: No primary care provider on file.  HPI/Brief narrative 69 y.o. female with history of DM 2, HTN, HLD, asthma, rheumatoid arthritis, former smoker, recently moved to this area from [place], presented to the ED with fever, chills, productive cough, dyspnea, wheezing, chest pain and poor appetite of ~ 1 week duration. In the ED, febrile to 102.71F, tachycardic, WBC 12.5 and chest x-ray suggestive of right lower lobe pneumonia. Treating for CAP, then developed acute kidney injury-improving and narrow complex tachycardia 2/27.   Assessment/Plan:   1. Right lower lobe community acquired pneumonia: Continue IV Rocephin and azithromycin (day 3 of 7 antibiotics). Blood cultures 2: Negative to date. Urine Legionella and streptococcal antigen : Negative. Sputum culture: Normal OP flora. Transition to PO Abx 2/28. 2. Sepsis due to pneumonia: Present on admission. IV fluids and antibiotics as above. Hemodynamically stable. Sepsis physiology resolved. 3. Asthma/? COPD exacerbation: Precipitated by pneumonia. Improved after bronchodilators and a dose of Solu-Medrol in ED. Continue oxygen, bronchodilators, Dulera and brief IV Solu-Medrol. Improving. ~ 10 pack year smoking history. Recommend OP PFT's. States, she is supposed to do OP Sleep study.  4. Acute kidney injury: Unclear etiology. Held NSAIDs, diuretics, ACEI-ARB. IV fluids. Acute renal failure resolved. Continue to hold ARB. Follow BMP. 5. Mild dehydration: Secondary to poor oral intake and sepsis. Hold diuretics. Improved. DC IV fluids. 6. Essential hypertension: Controlled. Continue home medications except ARB. Resume Bumex. 7. Type II DM: Controlled. Hold metformin. SSI. 8. Rheumatoid arthritis: Patient takes scheduled NSAID/ibuprofen-DC'd secondary to acute kidney injury.  9. Iron deficiency Anemia: No prior labs to compare. No reported bleeding or melena.  Transfuse if hemoglobin less than 7 g per DL. Ferritin 16. S/P IV iron. PO Iron at DC. Patient states that she had anemia workup including EGD and colonoscopy in [place] last year-requested records. 10. History of HLD 11. Narrow complex tachycardia (ST vs SVT Vs junctional tachycardia): Had sustained tachycardia in the 130s-140s this morning. EKG: Narrow complex tachycardia 127 bpm,?? P waves, incomplete RBBB and no acute changes. Asymptomatic. Continue verapamil. Resolved after a dose of IV metoprolol 5 MG. Check 2-D echo and TSH. Continue telemetry.   Code Status: Full Family Communication: None at bedside. Disposition Plan: Home when medically stable.   Consultants:  None  Procedures:  None  Antibiotics:  IV Rocephin 2/24 >  IV azithromycin 2/24 >  Subjective: Nonproductive cough-unable to bring up sputum. DOE. No CP/Palpitations.  Objective: Filed Vitals:   01/09/15 0808 01/09/15 0914 01/09/15 1320 01/09/15 1420  BP: 131/65  130/59   Pulse: 108  84   Temp: 98.7 F (37.1 C)  97.7 F (36.5 C)   TempSrc: Oral  Oral   Resp:   18   Height:      Weight:      SpO2: 100% 100% 99% 94%    Intake/Output Summary (Last 24 hours) at 01/09/15 1510 Last data filed at 01/09/15 1426  Gross per 24 hour  Intake    840 ml  Output   2300 ml  Net  -1460 ml   Filed Weights   01/07/15 0617 01/08/15 0443 01/09/15 0521  Weight: 102 kg (224 lb 13.9 oz) 102.286 kg (225 lb 8 oz) 102.422 kg (225 lb 12.8 oz)     Exam:  General exam: Pleasant middle-aged female sitting up comfortably in chair.  Respiratory system: Occasional scattered posterior rhonchi but otherwise clear to auscultation. No increased work of  breathing. Cardiovascular system: S1 & S2 heard, RRR. No JVD, murmurs, gallops, clicks or pedal edema. Telemetry: Narrow complex tachycardia in the 130s-140s. Gastrointestinal system: Abdomen is nondistended, soft and nontender. Normal bowel sounds heard. Central nervous system:  Alert and oriented. No focal neurological deficits. Extremities: Symmetric 5 x 5 power.   Data Reviewed: Basic Metabolic Panel:  Recent Labs Lab 01/07/15 0505 01/07/15 0939 01/08/15 0525 01/08/15 0840 01/09/15 0557  NA 138 137 139 136 138  K 5.4* 4.9 5.4* 4.9 5.0  CL 102 99 101 103 104  CO2 26 27 29 28 30   GLUCOSE 212* 157* 148* 122* 133*  BUN 25* 27* 30* 30* 32*  CREATININE 1.77* 1.62* 1.41* 1.29* 1.13*  CALCIUM 9.0 9.3 9.1 8.9 9.0   Liver Function Tests:  Recent Labs Lab 01/06/15 1531  AST 29  ALT 21  ALKPHOS 90  BILITOT 0.6  PROT 7.4  ALBUMIN 3.6   No results for input(s): LIPASE, AMYLASE in the last 168 hours. No results for input(s): AMMONIA in the last 168 hours. CBC:  Recent Labs Lab 01/06/15 1531 01/07/15 0505 01/08/15 0525 01/09/15 0557  WBC 12.5* 15.1* 18.5* 16.3*  HGB 8.3* 7.9* 7.4* 7.5*  HCT 27.7* 26.8* 25.2* 26.2*  MCV 86.3 87.0 87.2 88.5  PLT 355 413* 397 454*   Cardiac Enzymes: No results for input(s): CKTOTAL, CKMB, CKMBINDEX, TROPONINI in the last 168 hours. BNP (last 3 results) No results for input(s): PROBNP in the last 8760 hours. CBG:  Recent Labs Lab 01/08/15 1139 01/08/15 1621 01/08/15 2301 01/09/15 0737 01/09/15 1137  GLUCAP 121* 147* 171* 120* 113*    Recent Results (from the past 240 hour(s))  Blood culture (routine x 2)     Status: None (Preliminary result)   Collection Time: 01/06/15  3:31 PM  Result Value Ref Range Status   Specimen Description BLOOD LEFT ARM  Final   Special Requests   Final    BOTTLES DRAWN AEROBIC AND ANAEROBIC 01/08/15 BOTH BOTTLES   Culture   Final           BLOOD CULTURE RECEIVED NO GROWTH TO DATE CULTURE WILL BE HELD FOR 5 DAYS BEFORE ISSUING A FINAL NEGATIVE REPORT Performed at    Report Status PENDING  Incomplete  Blood culture (routine x 2)     Status: None (Preliminary result)   Collection Time: 01/06/15  3:47 PM  Result Value Ref Range Status   Specimen  Description BLOOD RIGHT HAND  Final   Special Requests   Final    BOTTLES DRAWN AEROBIC AND ANAEROBIC 5CC AEROBIC AND 2CC ANAEROBIC   Culture   Final           BLOOD CULTURE RECEIVED NO GROWTH TO DATE CULTURE WILL BE HELD FOR 5 DAYS BEFORE ISSUING A FINAL NEGATIVE REPORT Performed at 01/08/15    Report Status PENDING  Incomplete  Urine culture     Status: None   Collection Time: 01/06/15  4:15 PM  Result Value Ref Range Status   Specimen Description URINE, CLEAN CATCH  Final   Special Requests Normal  Final   Colony Count   Final    25,000 COLONIES/ML Performed at 01/08/15    Culture   Final    ESCHERICHIA COLI Performed at [facility]    Report Status 01/08/2015 FINAL  Final   Organism ID, Bacteria ESCHERICHIA COLI  Final      Susceptibility   Escherichia coli -  MIC*    AMPICILLIN >=32 RESISTANT Resistant     CEFAZOLIN <=4 SENSITIVE Sensitive     CEFTRIAXONE <=1 SENSITIVE Sensitive     CIPROFLOXACIN >=4 RESISTANT Resistant     GENTAMICIN <=1 SENSITIVE Sensitive     LEVOFLOXACIN >=8 RESISTANT Resistant     NITROFURANTOIN <=16 SENSITIVE Sensitive     TOBRAMYCIN <=1 SENSITIVE Sensitive     TRIMETH/SULFA <=20 SENSITIVE Sensitive     PIP/TAZO <=4 SENSITIVE Sensitive     * ESCHERICHIA COLI  Culture, expectorated sputum-assessment     Status: None   Collection Time: 01/06/15  8:38 PM  Result Value Ref Range Status   Specimen Description SPUTUM  Final   Special Requests NONE  Final   Sputum evaluation   Final    THIS SPECIMEN IS ACCEPTABLE. RESPIRATORY CULTURE REPORT TO FOLLOW.   Report Status 01/06/2015 FINAL  Final  Culture, respiratory (NON-Expectorated)     Status: None   Collection Time: 01/06/15  8:38 PM  Result Value Ref Range Status   Specimen Description SPUTUM  Final   Special Requests NONE  Final   Gram Stain   Final    ABUNDANT WBC PRESENT,BOTH PMN AND MONONUCLEAR RARE SQUAMOUS EPITHELIAL CELLS PRESENT NO ORGANISMS  SEEN Performed at [facility]    Culture   Final    NORMAL OROPHARYNGEAL FLORA Performed at [facility]    Report Status 01/09/2015 FINAL  Final            Studies: No results found.      Scheduled Meds: . azithromycin  500 mg Intravenous Q24H  . bumetanide  2 mg Oral Daily  . cefTRIAXone (ROCEPHIN)  IV  1 g Intravenous Q24H  . clobetasol ointment   Topical BID  . cyclobenzaprine  10 mg Oral Daily  . diclofenac sodium  2 g Topical BID  . folic acid  1 mg Oral Daily  . guaiFENesin  1,200 mg Oral BID  . insulin aspart  0-9 Units Subcutaneous TID WC  . ipratropium  0.5 mg Nebulization Q6H  . levalbuterol  0.63 mg Nebulization Q6H  . methylPREDNISolone (SOLU-MEDROL) injection  40 mg Intravenous BID  . mometasone-formoterol  2 puff Inhalation BID  . montelukast  10 mg Oral QHS  . pantoprazole  40 mg Oral Daily  . senna  2 tablet Oral Daily  . simvastatin  20 mg Oral QHS  . sodium chloride  3 mL Intravenous Q12H  . verapamil  240 mg Oral Daily   Continuous Infusions:    Principal Problem:   Community acquired pneumonia-RLL Active Problems:   Sepsis due to pneumonia   Asthma exacerbation   Anemia   Essential hypertension   DM II (diabetes mellitus, type II), controlled   HLD (hyperlipidemia)   Rheumatoid arthritis   CAP (community acquired pneumonia)    Time spent: 30 minutes    Racquelle Hyser, MD, FACP, FHM. [facility] Pager [phone]  If 7PM-7AM, please contact night-coverage [email or web address] Password [email or web address] 01/09/2015, 3:10 PM    LOS: 3 days

## 2015-01-09 NOTE — Progress Notes (Signed)
Pt HR sustaining in the 120's-130's after albuterol treatment.  Md made aware.  Will continue to monitor closely.

## 2015-01-09 NOTE — Progress Notes (Signed)
1Cardiac telemetry notified me of pt going into possible flutter when HR went up to 150- unsustained.  MD made aware.  Will continue to monitor closely.

## 2015-01-10 DIAGNOSIS — I4891 Unspecified atrial fibrillation: Secondary | ICD-10-CM

## 2015-01-10 DIAGNOSIS — J181 Lobar pneumonia, unspecified organism: Secondary | ICD-10-CM

## 2015-01-10 LAB — GLUCOSE, CAPILLARY
Glucose-Capillary: 131 mg/dL — ABNORMAL HIGH (ref 70–99)
Glucose-Capillary: 176 mg/dL — ABNORMAL HIGH (ref 70–99)
Glucose-Capillary: 117 mg/dL — ABNORMAL HIGH (ref 70–99)
Glucose-Capillary: 252 mg/dL — ABNORMAL HIGH (ref 70–99)

## 2015-01-10 LAB — BASIC METABOLIC PANEL
Anion gap: 6 (ref 5–15)
BUN: 34 mg/dL — ABNORMAL HIGH (ref 6–23)
CALCIUM: 9.3 mg/dL (ref 8.4–10.5)
CHLORIDE: 99 mmol/L (ref 96–112)
CO2: 31 mmol/L (ref 19–32)
Creatinine, Ser: 1.26 mg/dL — ABNORMAL HIGH (ref 0.50–1.10)
GFR calc non Af Amer: 43 mL/min — ABNORMAL LOW (ref >90)
GFR, EST AFRICAN AMERICAN: 50 mL/min — AB (ref >90)
Glucose, Bld: 198 mg/dL — ABNORMAL HIGH (ref 70–99)
Potassium: 4.6 mmol/L (ref 3.5–5.1)
SODIUM: 136 mmol/L (ref 135–145)

## 2015-01-10 LAB — CBC
HCT: 27.6 % — ABNORMAL LOW (ref 36.0–46.0)
Hemoglobin: 8 g/dL — ABNORMAL LOW (ref 12.0–15.0)
MCH: 25.5 pg — ABNORMAL LOW (ref 26.0–34.0)
MCHC: 29 g/dL — AB (ref 30.0–36.0)
MCV: 87.9 fL (ref 78.0–100.0)
Platelets: 498 10*3/uL — ABNORMAL HIGH (ref 150–400)
RBC: 3.14 MIL/uL — AB (ref 3.87–5.11)
RDW: 17.4 % — ABNORMAL HIGH (ref 11.5–15.5)
WBC: 14.8 10*3/uL — ABNORMAL HIGH (ref 4.0–10.5)

## 2015-01-10 LAB — MAGNESIUM: MAGNESIUM: 2.1 mg/dL (ref 1.5–2.5)

## 2015-01-10 LAB — T4, FREE: FREE T4: 1.1 ng/dL (ref 0.80–1.80)

## 2015-01-10 LAB — OCCULT BLOOD X 1 CARD TO LAB, STOOL: FECAL OCCULT BLD: POSITIVE — AB

## 2015-01-10 MED ORDER — LEVALBUTEROL HCL 0.63 MG/3ML IN NEBU
0.6300 mg | INHALATION_SOLUTION | Freq: Three times a day (TID) | RESPIRATORY_TRACT | Status: DC
  Administered 2015-01-10 – 2015-01-11 (×5): 0.63 mg via RESPIRATORY_TRACT
  Filled 2015-01-10 (×5): qty 3

## 2015-01-10 MED ORDER — METOPROLOL TARTRATE 25 MG PO TABS
25.0000 mg | ORAL_TABLET | Freq: Two times a day (BID) | ORAL | Status: DC
  Administered 2015-01-10 – 2015-01-11 (×3): 25 mg via ORAL
  Filled 2015-01-10 (×3): qty 1

## 2015-01-10 MED ORDER — IPRATROPIUM BROMIDE 0.02 % IN SOLN
0.5000 mg | Freq: Three times a day (TID) | RESPIRATORY_TRACT | Status: DC
  Administered 2015-01-10 – 2015-01-11 (×5): 0.5 mg via RESPIRATORY_TRACT
  Filled 2015-01-10 (×5): qty 2.5

## 2015-01-10 MED ORDER — VERAPAMIL HCL ER 180 MG PO TBCR
360.0000 mg | EXTENDED_RELEASE_TABLET | Freq: Every day | ORAL | Status: DC
  Administered 2015-01-10 – 2015-01-11 (×2): 360 mg via ORAL
  Filled 2015-01-10 (×2): qty 2

## 2015-01-10 NOTE — Progress Notes (Signed)
Pt HR noted to be sustaining 120-140 after ambulating to and from BR. Pt presently sitting up in chair, eating breakfast w/ HR 130s. Denies SOB/dizziness/CP. O2 sat 94% RA. Dr Gonzella Lex made aware, orders received for PO lopressor. Not yet profiled in Pyxis, will administer and monitor for effect.

## 2015-01-10 NOTE — Progress Notes (Signed)
  Echocardiogram 2D Echocardiogram has been performed.  Anita Gonzalez 01/10/2015, 12:35 PM

## 2015-01-10 NOTE — Progress Notes (Signed)
[facility] PROGRESS NOTE  Anita Gonzalez MPN:[id number] DOB: 14-Aug-1946 DOA: 01/06/2015 PCP: Lerry Liner, MD  Brief narrative 69 y.o. female with history of DM 2, HTN, HLD, asthma, rheumatoid arthritis, former smoker, recently moved to this area from [place], presented to the ED with fever, chills, productive cough, dyspnea, wheezing, chest pain and poor appetite of ~ 1 week duration. In the ED, febrile to 102.85F, tachycardic, WBC 12.5 and chest x-ray suggestive of right lower lobe pneumonia. Treating for CAP, then developed acute kidney injury-improving and narrow complex tachycardia 2/27.  Assessment/Plan: Community acquired pneumonia with sepsis Continue IV Rocephin and azithromycin (day 4). Blood cultures negative. 3 for strep and Legionella antigen negative. Sputum culture growing normal flora. Sepsis resolved.  Asthma/? COPD exacerbation Precipitated by pneumonia. Symptoms improve with bronchodilators and a dose of Solu-Medrol in the ED. Sats stable on room air.. Outpatient pulmonary referral recommended.  Acute kidney injury Mild. Improved with IV fluids and holding ARB/ NSAIDs  Iron Deficiency anemia Needs GI referral as outpt. FOBT positive. Reports having EGD and colonoscopy in [place] last year. Iron supplement upon discharge.  Narrow complex tachycardia SHEENT having sustained tachycardia in 120s-130s. Incomplete RBBB on EKG. Asymptomatic. Continue verapamil added scheduled metoprolol. TSH suppressed. Check free T3 and T4.  2-D echo shows EF of 55-60% with no wall motion abnormality and 1 diastolic dysfunction. Normal valves.. Denies any weight changes, tremors, no changes. Has chronic insomnia. Denies skin changes, change in bowel habits.  Essential hypertension Hold ARB. Resume other home medications  Type 2 diabetes mellitus Monitor on SSI. Holding metformin.    Rheumatoid arthritis Patient takes NSAIDs which have been held due to acute kidney  injury     Code Status: Full code Family Communication: None at bedside Disposition Plan: Home once symptoms improve possibly 1-2 days.   Consultants: None  Procedures: 2-D echo   Antibiotics: IV Rocephin and azithromycin  HPI/Subjective: Patient seen and examined. She was tachycardic to 130s this morning. Reports dry mouth.   Objective: Filed Vitals:   01/10/15 0915  BP: 118/60  Pulse: 129  Temp:   Resp: 22    Intake/Output Summary (Last 24 hours) at 01/10/15 1235 Last data filed at 01/10/15 1209  Gross per 24 hour  Intake    910 ml  Output   3900 ml  Net  -2990 ml   Filed Weights   01/08/15 0443 01/09/15 0521 01/10/15 0622  Weight: 102.286 kg (225 lb 8 oz) 102.422 kg (225 lb 12.8 oz) 101.2 kg (223 lb 1.7 oz)    Exam:   General: Elderly obese female lying in bed in no acute distress  HEENT: Pallor present, moist oral mucosa, neck supple,  Cardiovascular: Normal S1 and S2, no murmurs rub or gallop  Chest: Clear to auscultation bilateral:  GI: Soft, nondistended, nontender, bowel sounds present  Musculoskeletal: Warm, no edema  CNS: Alert and oriented  Data Reviewed: Basic Metabolic Panel:  Recent Labs Lab 01/07/15 0939 01/08/15 0525 01/08/15 0840 01/09/15 0557 01/10/15 0521  NA 137 139 136 138 136  K 4.9 5.4* 4.9 5.0 4.6  CL 99 101 103 104 99  CO2 27 29 28 30 31   GLUCOSE 157* 148* 122* 133* 198*  BUN 27* 30* 30* 32* 34*  CREATININE 1.62* 1.41* 1.29* 1.13* 1.26*  CALCIUM 9.3 9.1 8.9 9.0 9.3   Liver Function Tests:  Recent Labs Lab 01/06/15 1531  AST 29  ALT 21  ALKPHOS 90  BILITOT 0.6  PROT 7.4  ALBUMIN 3.6  No results for input(s): LIPASE, AMYLASE in the last 168 hours. No results for input(s): AMMONIA in the last 168 hours. CBC:  Recent Labs Lab 01/06/15 1531 01/07/15 0505 01/08/15 0525 01/09/15 0557 01/10/15 0521  WBC 12.5* 15.1* 18.5* 16.3* 14.8*  HGB 8.3* 7.9* 7.4* 7.5* 8.0*  HCT 27.7* 26.8* 25.2* 26.2* 27.6*   MCV 86.3 87.0 87.2 88.5 87.9  PLT 355 413* 397 454* 498*   Cardiac Enzymes: No results for input(s): CKTOTAL, CKMB, CKMBINDEX, TROPONINI in the last 168 hours. BNP (last 3 results) No results for input(s): BNP in the last 8760 hours.  ProBNP (last 3 results) No results for input(s): PROBNP in the last 8760 hours.  CBG:  Recent Labs Lab 01/09/15 1137 01/09/15 1650 01/09/15 2153 01/10/15 0738 01/10/15 1203  GLUCAP 113* 120* 183* 131* 176*    Recent Results (from the past 240 hour(s))  Blood culture (routine x 2)     Status: None (Preliminary result)   Collection Time: 01/06/15  3:31 PM  Result Value Ref Range Status   Specimen Description BLOOD LEFT ARM  Final   Special Requests   Final    BOTTLES DRAWN AEROBIC AND ANAEROBIC BOTH BOTTLES   Culture   Final           BLOOD CULTURE RECEIVED NO GROWTH TO DATE CULTURE WILL BE HELD FOR 5 DAYS BEFORE ISSUING A FINAL NEGATIVE REPORT Performed at [facility]    Report Status PENDING  Incomplete  Blood culture (routine x 2)     Status: None (Preliminary result)   Collection Time: 01/06/15  3:47 PM  Result Value Ref Range Status   Specimen Description BLOOD RIGHT HAND  Final   Special Requests   Final    BOTTLES DRAWN AEROBIC AND ANAEROBIC 5CC AEROBIC AND 2CC ANAEROBIC   Culture   Final           BLOOD CULTURE RECEIVED NO GROWTH TO DATE CULTURE WILL BE HELD FOR 5 DAYS BEFORE ISSUING A FINAL NEGATIVE REPORT Performed at [facility]    Report Status PENDING  Incomplete  Urine culture     Status: None   Collection Time: 01/06/15  4:15 PM  Result Value Ref Range Status   Specimen Description URINE, CLEAN CATCH  Final   Special Requests Normal  Final   Colony Count   Final    25,000 COLONIES/ML Performed at [facility]    Culture   Final    ESCHERICHIA COLI Performed at [facility]    Report Status 01/08/2015 FINAL  Final   Organism ID, Bacteria ESCHERICHIA COLI  Final       Susceptibility   Escherichia coli - MIC*    AMPICILLIN >=32 RESISTANT Resistant     CEFAZOLIN <=4 SENSITIVE Sensitive     CEFTRIAXONE <=1 SENSITIVE Sensitive     CIPROFLOXACIN >=4 RESISTANT Resistant     GENTAMICIN <=1 SENSITIVE Sensitive     LEVOFLOXACIN >=8 RESISTANT Resistant     NITROFURANTOIN <=16 SENSITIVE Sensitive     TOBRAMYCIN <=1 SENSITIVE Sensitive     TRIMETH/SULFA <=20 SENSITIVE Sensitive     PIP/TAZO <=4 SENSITIVE Sensitive     * ESCHERICHIA COLI  Culture, expectorated sputum-assessment     Status: None   Collection Time: 01/06/15  8:38 PM  Result Value Ref Range Status   Specimen Description SPUTUM  Final   Special Requests NONE  Final   Sputum evaluation   Final    THIS  SPECIMEN IS ACCEPTABLE. RESPIRATORY CULTURE REPORT TO FOLLOW.   Report Status 01/06/2015 FINAL  Final  Culture, respiratory (NON-Expectorated)     Status: None   Collection Time: 01/06/15  8:38 PM  Result Value Ref Range Status   Specimen Description SPUTUM  Final   Special Requests NONE  Final   Gram Stain   Final    ABUNDANT WBC PRESENT,BOTH PMN AND MONONUCLEAR RARE SQUAMOUS EPITHELIAL CELLS PRESENT NO ORGANISMS SEEN Performed at [facility]    Culture   Final    NORMAL OROPHARYNGEAL FLORA Performed at [facility]    Report Status 01/09/2015 FINAL  Final     Studies: No results found.  Scheduled Meds: . azithromycin  500 mg Intravenous Q24H  . bumetanide  2 mg Oral Daily  . cefTRIAXone (ROCEPHIN)  IV  1 g Intravenous Q24H  . clobetasol ointment   Topical BID  . cyclobenzaprine  10 mg Oral Daily  . diclofenac sodium  2 g Topical BID  . folic acid  1 mg Oral Daily  . guaiFENesin  1,200 mg Oral BID  . insulin aspart  0-9 Units Subcutaneous TID WC  . ipratropium  0.5 mg Nebulization TID  . levalbuterol  0.63 mg Nebulization TID  . metoprolol tartrate  25 mg Oral BID  . mometasone-formoterol  2 puff Inhalation BID  . montelukast  10 mg Oral QHS  . pantoprazole   40 mg Oral Daily  . predniSONE  40 mg Oral Q breakfast  . senna  2 tablet Oral Daily  . simvastatin  20 mg Oral QHS  . sodium chloride  3 mL Intravenous Q12H  . verapamil  360 mg Oral Daily   Continuous Infusions:     Time spent: 25 minutes    Shamarr Faucett  [facility] Pager [phone]. If 7PM-7AM, please contact night-coverage at [email or web address], password [facility] 01/10/2015, 12:35 PM  LOS: 4 days

## 2015-01-10 NOTE — Progress Notes (Signed)
CARE MANAGEMENT NOTE 01/10/2015  Patient:  Anita Gonzalez, Anita Gonzalez   Account Number:  [id number]  Date Initiated:  01/07/2015  Documentation initiated by:  Trinna Balloon  Subjective/Objective Assessment:   pt admitted with fever T102     Action/Plan:   from home   Anticipated DC Date:  01/10/2015   Anticipated DC Plan:  HOME/SELF CARE      DC Planning Services  CM consult      [facility] Choice  HOME HEALTH   Choice offered to / List presented to:  C-1 Patient   DME arranged  WALKER - Lavone Nian      DME agency  [facility]     HH arranged  HH-2 PT      [facility] agency  [facility]   Status of service:  Completed, signed off Medicare Important Message given?  YES (If response is "NO", the following Medicare IM given date fields will be blank) Date Medicare IM given:  01/09/2015 Medicare IM given by:  [facility] Date Additional Medicare IM given:   Additional Medicare IM given by:    Discharge Disposition:  HOME W HOME HEALTH SERVICES  Per UR Regulation:  Reviewed for med. necessity/level of care/duration of stay  If discussed at Long Length of Stay Meetings, dates discussed:    Comments:  01/10/2015 1545 NCM spoke to pt and [employer] delivered Rollator to room on 2/27.   Isidoro Donning RN CCM Case Mgmt phone [phone]  01/09/2015 1425 NCM spoke to pt and states she has not received her Rollator. NCM contacted weekend [employer] DME rep, Ty. He will follow up on delivery. HH arranged with [facility]. Faxed orders for DME to [place], [phone].  Isidoro Donning RN CCM Case Mgmt phone [phone]  01/07/15 MMcGibboney, RN, BSN Chart reviewed.

## 2015-01-11 ENCOUNTER — Telehealth: Payer: Self-pay | Admitting: Hematology

## 2015-01-11 DIAGNOSIS — N179 Acute kidney failure, unspecified: Secondary | ICD-10-CM

## 2015-01-11 DIAGNOSIS — B9620 Unspecified Escherichia coli [E. coli] as the cause of diseases classified elsewhere: Secondary | ICD-10-CM | POA: Diagnosis present

## 2015-01-11 DIAGNOSIS — R000 Tachycardia, unspecified: Secondary | ICD-10-CM

## 2015-01-11 DIAGNOSIS — J9601 Acute respiratory failure with hypoxia: Secondary | ICD-10-CM

## 2015-01-11 DIAGNOSIS — N390 Urinary tract infection, site not specified: Secondary | ICD-10-CM | POA: Diagnosis present

## 2015-01-11 HISTORY — DX: Urinary tract infection, site not specified: N39.0

## 2015-01-11 HISTORY — DX: Acute kidney failure, unspecified: N17.9

## 2015-01-11 HISTORY — DX: Tachycardia, unspecified: R00.0

## 2015-01-11 HISTORY — DX: Unspecified Escherichia coli (E. coli) as the cause of diseases classified elsewhere: B96.20

## 2015-01-11 LAB — BASIC METABOLIC PANEL
Anion gap: 10 (ref 5–15)
BUN: 37 mg/dL — ABNORMAL HIGH (ref 6–23)
CO2: 33 mmol/L — ABNORMAL HIGH (ref 19–32)
Calcium: 9.3 mg/dL (ref 8.4–10.5)
Chloride: 99 mmol/L (ref 96–112)
Creatinine, Ser: 1.44 mg/dL — ABNORMAL HIGH (ref 0.50–1.10)
GFR calc non Af Amer: 37 mL/min — ABNORMAL LOW (ref >90)
GFR, EST AFRICAN AMERICAN: 43 mL/min — AB (ref >90)
GLUCOSE: 104 mg/dL — AB (ref 70–99)
Potassium: 4.1 mmol/L (ref 3.5–5.1)
Sodium: 142 mmol/L (ref 135–145)

## 2015-01-11 LAB — CBC
HEMATOCRIT: 28.4 % — AB (ref 36.0–46.0)
Hemoglobin: 8.4 g/dL — ABNORMAL LOW (ref 12.0–15.0)
MCH: 25.5 pg — ABNORMAL LOW (ref 26.0–34.0)
MCHC: 29.6 g/dL — AB (ref 30.0–36.0)
MCV: 86.1 fL (ref 78.0–100.0)
Platelets: 497 10*3/uL — ABNORMAL HIGH (ref 150–400)
RBC: 3.3 MIL/uL — AB (ref 3.87–5.11)
RDW: 17 % — ABNORMAL HIGH (ref 11.5–15.5)
WBC: 13.8 10*3/uL — AB (ref 4.0–10.5)

## 2015-01-11 LAB — GLUCOSE, CAPILLARY
Glucose-Capillary: 109 mg/dL — ABNORMAL HIGH (ref 70–99)
Glucose-Capillary: 142 mg/dL — ABNORMAL HIGH (ref 70–99)

## 2015-01-11 LAB — T3, FREE: T3, Free: 1.8 pg/mL — ABNORMAL LOW (ref 2.0–4.4)

## 2015-01-11 MED ORDER — PREDNISONE 20 MG PO TABS: 40.0000 mg | ORAL_TABLET | Freq: Every day | ORAL | Status: DC

## 2015-01-11 MED ORDER — METOPROLOL TARTRATE 25 MG PO TABS: 25.0000 mg | ORAL_TABLET | Freq: Two times a day (BID) | ORAL | Status: DC

## 2015-01-11 MED ORDER — FERROUS SULFATE 325 (65 FE) MG PO TABS
325.0000 mg | ORAL_TABLET | Freq: Two times a day (BID) | ORAL | Status: DC
  Administered 2015-01-11: 325 mg via ORAL
  Filled 2015-01-11: qty 1

## 2015-01-11 MED ORDER — FERROUS SULFATE 325 (65 FE) MG PO TABS: 325.0000 mg | ORAL_TABLET | Freq: Two times a day (BID) | ORAL | Status: DC

## 2015-01-11 MED ORDER — GLIMEPIRIDE 2 MG PO TABS: 2.0000 mg | ORAL_TABLET | Freq: Every day | ORAL | Status: DC

## 2015-01-11 MED ORDER — VERAPAMIL HCL ER 360 MG PO CP24: 360.0000 mg | ORAL_CAPSULE | Freq: Every day | ORAL | Status: DC

## 2015-01-11 MED ORDER — FLUTICASONE-SALMETEROL 500-50 MCG/DOSE IN AEPB: 2.0000 | INHALATION_SPRAY | Freq: Every day | RESPIRATORY_TRACT | Status: DC

## 2015-01-11 MED ORDER — LEVOFLOXACIN 500 MG PO TABS: 500.0000 mg | ORAL_TABLET | Freq: Every day | ORAL | Status: AC

## 2015-01-11 NOTE — Telephone Encounter (Signed)
regarding appt pt is in the hospital and caregiver will have pt to contact the office to confirm appt. on 01/28/15 at 1:30 w/Feng

## 2015-01-11 NOTE — Progress Notes (Signed)
D/C instructions reviewed w/ pt and dtr. Both verbalize understanding and all questions answered. Pt d/c in w/c in stable condition to dtr's car by NT. Pt in possession of d/c instructions, scripts, portable O2, rollator, and all personal belongings.

## 2015-01-11 NOTE — Discharge Summary (Signed)
Physician Discharge Summary  Anita Gonzalez ZOX:[id number] DOB: January 05, 1946 DOA: 01/06/2015  PCP: Lerry Liner, MD Admit date: 01/06/2015 Discharge date: 01/11/2015  Time spent: 35 minutes  Recommendations for Outpatient Follow-up:  1. Discharge home with home health PT and home O2 (2 L via nasal cannula for continuous use.) Follow-up with PCP in one week .patient may go there as walk in in 1 week. 2. Please monitored repeat creatinine during outpatient follow-up. Patient's losartan, Bumex and metformin have been held due to acute kidney injury. 3. Please obtain results of EGD and colonoscopy done by patient in female and one year back. Patient has iron deficiency anemia and I have started her on iron supplements. 4. Patient will complete antibiotic course on 3/2. 5. Please check repeat TSH in about 6 weeks (was suppressed when checked in the hospital)   Discharge Diagnoses:  Principal Problem:   Sepsis due to lobar pneumonia  Active Problems:   Community acquired pneumonia-right lower lobe   Asthma exacerbation   Anemia, iron deficiency   Essential hypertension   DM II (diabetes mellitus, type II), controlled   HLD (hyperlipidemia)   Rheumatoid arthritis   Acute respiratory failure with hypoxia   Acute kidney injury   Sinus tachycardia   Escherichia coli UTI   Discharge Condition: Fair  Diet recommendation: Diabetic/low sodium  Filed Weights   01/08/15 0443 01/09/15 0521 01/10/15 0622  Weight: 102.286 kg (225 lb 8 oz) 102.422 kg (225 lb 12.8 oz) 101.2 kg (223 lb 1.7 oz)    History of present illness:  Please refer to admission H&P for details, but in brief, 69 y.o. female with history of DM 2, HTN, HLD, asthma, rheumatoid arthritis, former smoker, recently moved to this area from [place], presented to the ED with fever, chills, productive cough, dyspnea, wheezing, chest pain and poor appetite of ~ 1 week duration. In the ED, febrile to 102.60F, tachycardic, WBC 12.5 and chest  x-ray suggestive of right lower lobe pneumonia. Treating for CAP, then developed acute kidney injury-improving and narrow complex tachycardia 2/27.  Hospital Course:  Community acquired pneumonia with sepsis Treated empirically with IV Rocephin and azithromycin (D5). Will discharge her on oral Levaquin to complete a total 7 day course. Blood cultures negative. Urine for strep and Legionella antigen negative. Sputum culture growing normal flora. Sepsis resolved.  Asthma/? COPD exacerbation Precipitated by pneumonia. Symptoms improve with bronchodilators and a dose of Solu-Medrol in the ED. patient hypoxic with O2 sats dropping to 87% on room air at rest and 86% on ambulation. Ordered home O2 for continuous use. Continue home inhalers. Follow-up with PCP as outpatient.  Acute kidney injury Mild. Patient on losartan, Bumex and also taking ibuprofen for arthritis which have all been held. Advised to avoid NSAIDs. I have also discontinued her metformin and prescribed her Amaryl. Patient needs renal function to be checked as outpatient.  Iron Deficiency anemia  FOBT positive. Reports having EGD and colonoscopy in [place] last year. Please obtain his records. Iron supplement upon discharge.  Narrow complex tachycardia Patient having sustained tachycardia in 120s-130s. Incomplete RBBB on EKG. Asymptomatic. Continue verapamil added scheduled metoprolol. TSH suppressed. Normal free T4 were suppressed free T3.. 2-D echo shows EF of 55-60% with no wall motion abnormality and 1 diastolic dysfunction. Normal valves.. Denies any weight changes, tremors, no changes. Has chronic insomnia. Denies skin changes, change in bowel habits. Needs repeat TSH in about 6-8 weeks. -Heart rate has improved after increasing verapamil dose and adding low-dose metoprolol. Follow-up with PCP  as outpatient  Essential hypertension Hold ARB. Resume other home medications  Type 2 diabetes mellitus Monitor on SSI. Holding  metformin.  Escherichia coli UTI Cultures sensitive to Rocephin. She has received 5 days of it already. Asymptomatic.  Rheumatoid arthritis Patient takes NSAIDs which have been discontinued due to acute kidney injury. Patient advised to avoid NSAIDs.   Patient stable for discharge home with outpatient PCP follow-up.  Code Status: Full code Family Communication: None at bedside Disposition Plan: Home with home health PT   Consultants: None  Procedures: 2-D echo   Antibiotics: IV Rocephin and azithromycin  Discharge oral Levaquin until 3/2  Discharge Exam: Filed Vitals:   01/11/15 0930  BP: 127/64  Pulse: 78  Temp: 98.5 F (36.9 C)  Resp: 20     General: Elderly obese female lying in bed in no acute distress  HEENT: Pallor present, moist oral mucosa, neck supple,  Cardiovascular: Normal S1 and S2, no murmurs rub or gallop  Chest: Clear to auscultation bilaterally  GI: Soft, nondistended, nontender, bowel sounds present  Musculoskeletal: Warm, no edema  CNS: Alert and oriented  Discharge Instructions    Current Discharge Medication List    START taking these medications   Details  ferrous sulfate 325 (65 FE) MG tablet Take 1 tablet (325 mg total) by mouth 2 (two) times daily with a meal. Qty: 60 tablet, Refills: 3    glimepiride (AMARYL) 2 MG tablet Take 1 tablet (2 mg total) by mouth daily with breakfast. Qty: 30 tablet, Refills: 0    levofloxacin (LEVAQUIN) 500 MG tablet Take 1 tablet (500 mg total) by mouth daily. Qty: 3 tablet, Refills: 0    metoprolol tartrate (LOPRESSOR) 25 MG tablet Take 1 tablet (25 mg total) by mouth 2 (two) times daily. Qty: 60 tablet, Refills: 0    predniSONE (DELTASONE) 20 MG tablet Take 2 tablets (40 mg total) by mouth daily with breakfast. Qty: 3 tablet, Refills: 0    Continue these medications which have changed verapamil (VERELAN PM) 360 MG 24 hr capsule Take 360 mg by mouth daily at bedtime      CONTINUE  these medications which have NOT CHANGED   Details  albuterol (PROVENTIL HFA;VENTOLIN HFA) 108 (90 BASE) MCG/ACT inhaler Inhale 2 puffs into the lungs every 6 (six) hours as needed for wheezing or shortness of breath (wheezing).     cyclobenzaprine (FLEXERIL) 10 MG tablet Take 10 mg by mouth daily.    diclofenac sodium (VOLTAREN) 1 % GEL Apply 2 g topically 2 (two) times daily.    Fluticasone-Salmeterol (ADVAIR) 500-50 MCG/DOSE AEPB Inhale 2 puffs into the lungs daily.    folic acid (FOLVITE) 1 MG tablet Take 1 mg by mouth daily.    montelukast (SINGULAIR) 10 MG tablet Take 10 mg by mouth at bedtime.    omeprazole (PRILOSEC) 20 MG capsule Take 20 mg by mouth daily.    potassium chloride (K-DUR,KLOR-CON) 10 MEQ tablet Take 10 mEq by mouth daily.    simvastatin (ZOCOR) 20 MG tablet Take 20 mg by mouth daily.         clobetasol (TEMOVATE) 0.05 % GEL Apply 1 application topically 2 (two) times daily. Qty: 30 each, Refills: 0      STOP taking these medications     bumetanide (BUMEX) 2 MG tablet      ibuprofen (ADVIL,MOTRIN) 600 MG tablet      metFORMIN (GLUCOPHAGE) 1000 MG tablet      valsartan (DIOVAN) 80 MG tablet  Allergies  Allergen Reactions  . Fish Allergy Anaphylaxis  . Iodinated Diagnostic Agents   . Penicillins   . Percocet [Oxycodone-Acetaminophen]   . Prednisone Nausea Only   Follow-up Information    Follow up with [facility].   Why:  Home Health Physical Therapy   Contact information:   [address] [place] [address] [phone]       Follow up with Jearld Lesch, MD. Go in 2 weeks.   Specialty:  Specialist   Why:  To see Dr. Mayford Knife in 1-2 weeks for post Hospitalization as walk in   Contact information:   [address] [place] [address] [phone]        The results of significant diagnostics from this hospitalization (including imaging, microbiology, ancillary and laboratory) are listed  below for reference.    Significant Diagnostic Studies: Dg Chest Portable 1 View  01/06/2015   CLINICAL DATA:  One week history of difficulty breathing  EXAM: PORTABLE CHEST - 1 VIEW  COMPARISON:  None.  FINDINGS: There is airspace consolidation in the right lower lobe. Lungs elsewhere clear. Heart size and pulmonary vascularity are normal. No adenopathy. No bone lesions.  IMPRESSION: Right lower lobe airspace consolidation.   Electronically Signed   By: Bretta Bang III M.D.   On: 01/06/2015 16:03    Microbiology: Recent Results (from the past 240 hour(s))  Blood culture (routine x 2)     Status: None (Preliminary result)   Collection Time: 01/06/15  3:31 PM  Result Value Ref Range Status   Specimen Description BLOOD LEFT ARM  Final   Special Requests   Final    BOTTLES DRAWN AEROBIC AND ANAEROBIC BOTH BOTTLES   Culture   Final           BLOOD CULTURE RECEIVED NO GROWTH TO DATE CULTURE WILL BE HELD FOR 5 DAYS BEFORE ISSUING A FINAL NEGATIVE REPORT Performed at [facility]    Report Status PENDING  Incomplete  Blood culture (routine x 2)     Status: None (Preliminary result)   Collection Time: 01/06/15  3:47 PM  Result Value Ref Range Status   Specimen Description BLOOD RIGHT HAND  Final   Special Requests   Final    BOTTLES DRAWN AEROBIC AND ANAEROBIC 5CC AEROBIC AND 2CC ANAEROBIC   Culture   Final           BLOOD CULTURE RECEIVED NO GROWTH TO DATE CULTURE WILL BE HELD FOR 5 DAYS BEFORE ISSUING A FINAL NEGATIVE REPORT Performed at [facility]    Report Status PENDING  Incomplete  Urine culture     Status: None   Collection Time: 01/06/15  4:15 PM  Result Value Ref Range Status   Specimen Description URINE, CLEAN CATCH  Final   Special Requests Normal  Final   Colony Count   Final    25,000 COLONIES/ML Performed at [facility]    Culture   Final    ESCHERICHIA COLI Performed at [facility]    Report Status 01/08/2015 FINAL   Final   Organism ID, Bacteria ESCHERICHIA COLI  Final      Susceptibility   Escherichia coli - MIC*    AMPICILLIN >=32 RESISTANT Resistant     CEFAZOLIN <=4 SENSITIVE Sensitive     CEFTRIAXONE <=1 SENSITIVE Sensitive     CIPROFLOXACIN >=4 RESISTANT Resistant     GENTAMICIN <=1 SENSITIVE Sensitive     LEVOFLOXACIN >=8 RESISTANT  Resistant     NITROFURANTOIN <=16 SENSITIVE Sensitive     TOBRAMYCIN <=1 SENSITIVE Sensitive     TRIMETH/SULFA <=20 SENSITIVE Sensitive     PIP/TAZO <=4 SENSITIVE Sensitive     * ESCHERICHIA COLI  Culture, expectorated sputum-assessment     Status: None   Collection Time: 01/06/15  8:38 PM  Result Value Ref Range Status   Specimen Description SPUTUM  Final   Special Requests NONE  Final   Sputum evaluation   Final    THIS SPECIMEN IS ACCEPTABLE. RESPIRATORY CULTURE REPORT TO FOLLOW.   Report Status 01/06/2015 FINAL  Final  Culture, respiratory (NON-Expectorated)     Status: None   Collection Time: 01/06/15  8:38 PM  Result Value Ref Range Status   Specimen Description SPUTUM  Final   Special Requests NONE  Final   Gram Stain   Final    ABUNDANT WBC PRESENT,BOTH PMN AND MONONUCLEAR RARE SQUAMOUS EPITHELIAL CELLS PRESENT NO ORGANISMS SEEN Performed at [facility]    Culture   Final    NORMAL OROPHARYNGEAL FLORA Performed at [facility]    Report Status 01/09/2015 FINAL  Final     Labs: Basic Metabolic Panel:  Recent Labs Lab 01/08/15 0525 01/08/15 0840 01/09/15 0557 01/10/15 0521 01/11/15 0500  NA 139 136 138 136 142  K 5.4* 4.9 5.0 4.6 4.1  CL 101 103 104 99 99  CO2 33*  GLUCOSE 148* 122* 133* 198* 104*  BUN 30* 30* 32* 34* 37*  CREATININE 1.41* 1.29* 1.13* 1.26* 1.44*  CALCIUM 9.1 8.9 9.0 9.3 9.3  MG  --   --   --  2.1  --    Liver Function Tests:  Recent Labs Lab 01/06/15 1531  AST 29  ALT 21  ALKPHOS 90  BILITOT 0.6  PROT 7.4  ALBUMIN 3.6   No results for input(s): LIPASE, AMYLASE in the  last 168 hours. No results for input(s): AMMONIA in the last 168 hours. CBC:  Recent Labs Lab 01/07/15 0505 01/08/15 0525 01/09/15 0557 01/10/15 0521 01/11/15 0500  WBC 15.1* 18.5* 16.3* 14.8* 13.8*  HGB 7.9* 7.4* 7.5* 8.0* 8.4*  HCT 26.8* 25.2* 26.2* 27.6* 28.4*  MCV 87.0 87.2 88.5 87.9 86.1  PLT 413* 397 454* 498* 497*   Cardiac Enzymes: No results for input(s): CKTOTAL, CKMB, CKMBINDEX, TROPONINI in the last 168 hours. BNP: BNP (last 3 results) No results for input(s): BNP in the last 8760 hours.  ProBNP (last 3 results) No results for input(s): PROBNP in the last 8760 hours.  CBG:  Recent Labs Lab 01/10/15 1203 01/10/15 1642 01/10/15 2033 01/11/15 0743 01/11/15 1156  GLUCAP 176* 117* 252* 109* 142*       Signed:  Karys Meckley  [facility] 01/11/2015, 12:13 PM

## 2015-01-11 NOTE — Plan of Care (Signed)
Problem: Discharge Progression Outcomes Goal: O2 sats at patient's baseline Outcome: Adequate for Discharge Will be d/c on home O2.

## 2015-01-11 NOTE — Progress Notes (Signed)
PT Cancellation Note  Patient Details Name: Anita Gonzalez MRN: [id number] DOB: 07/23/1946   Cancelled Treatment:    Reason Eval/Treat Not Completed: Other (comment) (pt stated she recently walked with nursing and that walking increased her chronic LBP, she wants to rest right now. Will follow. )   Tamala Ser 01/11/2015, 12:24 PM  [phone]

## 2015-01-11 NOTE — Progress Notes (Signed)
O2 sat RA at rest: 87% O2 sat RA w/ ambulation: 86% Pt needed 2lnc to get O2 sat above 88%. Sat increased to 98% on 2lnc.

## 2015-01-11 NOTE — Progress Notes (Signed)
CARE MANAGEMENT NOTE 01/11/2015  Patient:  Anita Gonzalez, Anita Gonzalez   Account Number:  [id number]  Date Initiated:  01/07/2015  Documentation initiated by:  Trinna Balloon  Subjective/Objective Assessment:   pt admitted with fever T102     Action/Plan:   from home   Anticipated DC Date:  01/10/2015   Anticipated DC Plan:  HOME/SELF CARE      DC Planning Services  CM consult      [facility] Choice  HOME HEALTH   Choice offered to / List presented to:  C-1 Patient   DME arranged  [facility] - Lavone Nian      DME agency  [facility]     HH arranged  HH-2 PT      [facility] agency  [facility]   Status of service:  Completed, signed off Medicare Important Message given?  YES (If response is "NO", the following Medicare IM given date fields will be blank) Date Medicare IM given:  01/09/2015 Medicare IM given by:  [facility] Date Additional Medicare IM given:  01/11/2015 Additional Medicare IM given by:  Trinna Balloon  Discharge Disposition:  HOME [facility] SERVICES  Per UR Regulation:  Reviewed for med. necessity/level of care/duration of stay  If discussed at Long Length of Stay Meetings, dates discussed:    Comments:  01/11/15 MMcGibboney, RN, BSN Home O2 was ordered from [place] and faxed.   01/10/2015 1545 NCM spoke to pt and [employer] delivered Rollator to room on 2/27.   Isidoro Donning RN CCM Case Mgmt phone [phone]  01/09/2015 1425 NCM spoke to pt and states she has not received her Rollator. NCM contacted weekend [employer] DME rep, Ty. He will follow up on delivery. HH arranged with [facility]. Faxed orders for DME to [place], [phone].  Isidoro Donning RN CCM Case Mgmt phone [phone]  01/07/15 MMcGibboney, RN, BSN Chart reviewed.

## 2015-01-12 ENCOUNTER — Telehealth: Payer: Self-pay | Admitting: Hematology

## 2015-01-12 LAB — CULTURE, BLOOD (ROUTINE X 2)
Culture: NO GROWTH
Culture: NO GROWTH

## 2015-01-12 NOTE — Telephone Encounter (Signed)
PT CONFIRMED APPT ON 01/28/15 AT 1:30 w Mosetta Putt

## 2015-01-16 ENCOUNTER — Encounter (HOSPITAL_COMMUNITY): Payer: Self-pay | Admitting: Emergency Medicine

## 2015-01-16 ENCOUNTER — Emergency Department (HOSPITAL_COMMUNITY)
Admission: EM | Admit: 2015-01-16 | Discharge: 2015-01-16 | Disposition: A | Discharge status: Home / Self Care | Payer: Medicare HMO | Attending: Emergency Medicine | Admitting: Emergency Medicine

## 2015-01-16 DIAGNOSIS — I10 Essential (primary) hypertension: Secondary | ICD-10-CM | POA: Diagnosis not present

## 2015-01-16 DIAGNOSIS — E11649 Type 2 diabetes mellitus with hypoglycemia without coma: Secondary | ICD-10-CM | POA: Insufficient documentation

## 2015-01-16 DIAGNOSIS — Z880 Allergy status to penicillin: Secondary | ICD-10-CM | POA: Insufficient documentation

## 2015-01-16 DIAGNOSIS — Z79899 Other long term (current) drug therapy: Secondary | ICD-10-CM | POA: Diagnosis not present

## 2015-01-16 DIAGNOSIS — Z7952 Long term (current) use of systemic steroids: Secondary | ICD-10-CM | POA: Diagnosis not present

## 2015-01-16 DIAGNOSIS — Z87891 Personal history of nicotine dependence: Secondary | ICD-10-CM | POA: Insufficient documentation

## 2015-01-16 DIAGNOSIS — J45909 Unspecified asthma, uncomplicated: Secondary | ICD-10-CM | POA: Diagnosis not present

## 2015-01-16 DIAGNOSIS — M1990 Unspecified osteoarthritis, unspecified site: Secondary | ICD-10-CM | POA: Diagnosis not present

## 2015-01-16 DIAGNOSIS — E162 Hypoglycemia, unspecified: Secondary | ICD-10-CM

## 2015-01-16 LAB — CBG MONITORING, ED
Glucose-Capillary: 74 mg/dL (ref 70–99)
Glucose-Capillary: 101 mg/dL — ABNORMAL HIGH (ref 70–99)
Glucose-Capillary: 49 mg/dL — ABNORMAL LOW (ref 70–99)

## 2015-01-16 NOTE — ED Notes (Signed)
Pt reports she has had intermittent decreases in CBG for past few days. Pt reports CBG was 51 this am. Has not eaten anything this am. Pt denies n/v/d. Pt states she was not hungry. CBG 74 on arrival. Pt recently discharged from hospital on Monday for PNA. Has been on home O2 2L [place] since then.

## 2015-01-16 NOTE — ED Notes (Signed)
Pt escorted to discharge window. Pt verbalized understanding discharge instructions. In no acute distress.  

## 2015-01-16 NOTE — Discharge Instructions (Signed)
Do not take glimepiride (amaryl) today. Begin taking it again tomorrow and only take it once per day in the morning after you have eaten something.    Low Blood Sugar Low blood sugar (hypoglycemia) means that the level of sugar in your blood is lower than it should be. Signs of low blood sugar include:  Getting sweaty.  Feeling hungry.  Feeling dizzy or weak.  Feeling sleepier than normal.  Feeling nervous.  Headaches.  Having a fast heartbeat. Low blood sugar can happen fast and can be an emergency. Your doctor can do tests to check your blood sugar level. You can have low blood sugar and not have diabetes. HOME CARE  Check your blood sugar as told by your doctor. If it is less than 70 mg/dl or as told by your doctor, take 1 of the following:  3 to 4 glucose tablets.   cup clear juice.   cup soda pop, not diet.  1 cup milk.  5 to 6 hard candies.  Recheck blood sugar after 15 minutes. Repeat until it is at the right level.  Eat a snack if it is more than 1 hour until the next meal.  Only take medicine as told by your doctor.  Do not skip meals. Eat on time.  Do not drink alcohol except with meals.  Check your blood glucose before driving.  Check your blood glucose before and after exercise.  Always carry treatment with you, such as glucose pills.  Always wear a medical alert bracelet if you have diabetes. GET HELP RIGHT AWAY IF:   Your blood glucose goes below 70 mg/dl or as told by your doctor, and you:  Are confused.  Are not able to swallow.  Pass out (faint).  You cannot treat yourself. You may need someone to help you.  You have low blood sugar problems often.  You have problems from your medicines.  You are not feeling better after 3 to 4 days.  You have vision changes. MAKE SURE YOU:   Understand these instructions.  Will watch this condition.  Will get help right away if you are not doing well or get worse. Document Released:  01/24/2010 Document Revised: 01/22/2012 Document Reviewed: 01/24/2010 [facility] Patient Information 2015 [place], [place]. This information is not intended to replace advice given to you by your health care provider. Make sure you discuss any questions you have with your health care provider.

## 2015-01-16 NOTE — ED Notes (Signed)
md made aware of CBG

## 2015-01-16 NOTE — ED Provider Notes (Signed)
CSN: [id number]     Arrival date & time 01/16/15  0824 History   First MD Initiated Contact with Patient 01/16/15 0825     No chief complaint on file.    (Consider location/radiation/quality/duration/timing/severity/associated sxs/prior Treatment) HPI   68yF with hypoglycemia. Pt recently admitted with pneumonia. Has hx of DM and previously on metformin BID. On discharge, this was stopped and prescribed glimepiride 2mg  daily. Has been taking levaquin as well but stopped this Thursday. When reviewing medications with patient, she has actually been taking glimepiride twice a day. Blood sugars have been running lower than they typically do. Some blood sugars in 40s,50s,60s. No other complaints. No dizziness, lightheadedness, nausea, diaphoresis.  Breathing has been better. Still occasionally coughing, wheezing but progresively betetr since discharge.   Past Medical History  Diagnosis Date  . Diabetes mellitus without complication   . Asthma   . Arthritis   . Hypertension    Past Surgical History  Procedure Laterality Date  . Cesarean section    . Hernia repair    . Hemorrhoid surgery     No family history on file. History  Substance Use Topics  . Smoking status: Former Wednesday  . Smokeless tobacco: Not on file  . Alcohol Use: Yes     Comment: occ   OB History    No data available     Review of Systems  All systems reviewed and negative, other than as noted in HPI.   Allergies  Fish allergy; Iodinated diagnostic agents; Penicillins; Percocet; and Prednisone  Home Medications   Prior to Admission medications   Medication Sig Start Date End Date Taking? Authorizing Provider  albuterol (PROVENTIL HFA;VENTOLIN HFA) 108 (90 BASE) MCG/ACT inhaler Inhale 2 puffs into the lungs every 6 (six) hours as needed for wheezing or shortness of breath (wheezing).     Historical Provider, MD  clobetasol (TEMOVATE) 0.05 % GEL Apply 1 application topically 2 (two) times daily. 12/21/14   Hannah  Muthersbaugh, PA-C  cyclobenzaprine (FLEXERIL) 10 MG tablet Take 10 mg by mouth daily.    Historical Provider, MD  diclofenac sodium (VOLTAREN) 1 % GEL Apply 2 g topically 2 (two) times daily.    Historical Provider, MD  ferrous sulfate 325 (65 FE) MG tablet Take 1 tablet (325 mg total) by mouth 2 (two) times daily with a meal. 01/11/15   Nishant Dhungel, MD  Fluticasone-Salmeterol (ADVAIR) 500-50 MCG/DOSE AEPB Inhale 2 puffs into the lungs daily. 01/11/15   Nishant Dhungel, MD  folic acid (FOLVITE) 1 MG tablet Take 1 mg by mouth daily.    Historical Provider, MD  glimepiride (AMARYL) 2 MG tablet Take 1 tablet (2 mg total) by mouth daily with breakfast. 01/11/15   Nishant Dhungel, MD  metoprolol tartrate (LOPRESSOR) 25 MG tablet Take 1 tablet (25 mg total) by mouth 2 (two) times daily. 01/11/15   Nishant Dhungel, MD  montelukast (SINGULAIR) 10 MG tablet Take 10 mg by mouth at bedtime.    Historical Provider, MD  omeprazole (PRILOSEC) 20 MG capsule Take 20 mg by mouth daily.    Historical Provider, MD  potassium chloride (K-DUR,KLOR-CON) 10 MEQ tablet Take 10 mEq by mouth daily.    Historical Provider, MD  predniSONE (DELTASONE) 20 MG tablet Take 2 tablets (40 mg total) by mouth daily with breakfast. 01/11/15   Nishant Dhungel, MD  simvastatin (ZOCOR) 20 MG tablet Take 20 mg by mouth daily.    Historical Provider, MD  verapamil (VERELAN PM) 360 MG 24 hr capsule  Take 1 capsule (360 mg total) by mouth daily. 01/11/15   Nishant Dhungel, MD   There were no vitals taken for this visit. Physical Exam  Constitutional: She appears well-developed and well-nourished. No distress.  HENT:  Head: Normocephalic and atraumatic.  Eyes: Conjunctivae are normal. Right eye exhibits no discharge. Left eye exhibits no discharge.  Neck: Neck supple.  Cardiovascular: Normal rate, regular rhythm and normal heart sounds.  Exam reveals no gallop and no friction rub.   No murmur heard. Pulmonary/Chest: Effort normal. No  respiratory distress. She has wheezes.  Faint expiratory wheezing  Abdominal: Soft. She exhibits no distension. There is no tenderness.  Musculoskeletal: She exhibits no edema or tenderness.  Neurological: She is alert.  Skin: Skin is warm and dry.  Psychiatric: She has a normal mood and affect. Her behavior is normal. Thought content normal.  Nursing note and vitals reviewed.   ED Course  Procedures (including critical care time) Labs Review Labs Reviewed  CBG MONITORING, ED - Abnormal; Notable for the following:    Glucose-Capillary 49 (*)    All other components within normal limits  CBG MONITORING, ED - Abnormal; Notable for the following:    Glucose-Capillary 101 (*)    All other components within normal limits  CBG MONITORING, ED    Imaging Review No results found.   EKG Interpretation None      MDM   Final diagnoses:  Hypoglycemia     68yF with hypoglycemia. Taking glimepiride BID instead of daily. Recent levaquin use possibly further exacerbating. Observed until glucose improved. Eating. Has not taken meds today yet. Advised to skip glimepiride today and resume tomorrow once daily after eating in the morning.     Raeford Razor, MD 01/17/15 [phone]

## 2015-01-16 NOTE — ED Notes (Signed)
md at bedside  Pt alert and oriented x4. Respirations even and unlabored, bilateral symmetrical rise and fall of chest. Skin warm and dry. In no acute distress. Denies needs.   

## 2015-01-16 NOTE — ED Notes (Signed)
Pt given sandwich and orange juice per md

## 2015-01-28 ENCOUNTER — Encounter: Payer: Self-pay | Admitting: Hematology

## 2015-01-28 ENCOUNTER — Ambulatory Visit: Payer: Medicare HMO

## 2015-01-28 ENCOUNTER — Telehealth: Payer: Self-pay | Admitting: *Deleted

## 2015-01-28 ENCOUNTER — Ambulatory Visit (HOSPITAL_BASED_OUTPATIENT_CLINIC_OR_DEPARTMENT_OTHER): Payer: Medicare HMO | Admitting: Hematology

## 2015-01-28 ENCOUNTER — Telehealth: Payer: Self-pay | Admitting: Hematology

## 2015-01-28 VITALS — BP 125/49 | HR 79 | Temp 98.4°F | Resp 18 | Ht 61.0 in | Wt 219.1 lb

## 2015-01-28 DIAGNOSIS — I10 Essential (primary) hypertension: Secondary | ICD-10-CM | POA: Diagnosis not present

## 2015-01-28 DIAGNOSIS — E119 Type 2 diabetes mellitus without complications: Secondary | ICD-10-CM | POA: Diagnosis not present

## 2015-01-28 DIAGNOSIS — D509 Iron deficiency anemia, unspecified: Secondary | ICD-10-CM

## 2015-01-28 DIAGNOSIS — D649 Anemia, unspecified: Secondary | ICD-10-CM

## 2015-01-28 HISTORY — DX: Iron deficiency anemia, unspecified: D50.9

## 2015-01-28 NOTE — Telephone Encounter (Signed)
Faxed pt's signed release of information to Dr. Verita Lamb, GI in [place], [place] to obtain records for continuity of care. Dr. Lucky Rathke  Phone    [phone]    ;   Fax     [phone]

## 2015-01-28 NOTE — Telephone Encounter (Signed)
Pt confirmed labs/ov per 03/17 POF, gave pt AVS and Calendar..... KJ, sent msg to add IV Fluids

## 2015-01-28 NOTE — Telephone Encounter (Signed)
Per staff message and POF I have scheduled appts. Advised scheduler of appts. JMW  

## 2015-01-28 NOTE — Progress Notes (Signed)
[place]  Telephone:([phone]) [phone] Fax:([phone]) [place] consult Note   Patient Care Team: Harvie Junior, MD as Referring Physician (Specialist) 01/28/2015  CHIEF COMPLAINTS/PURPOSE OF CONSULTATION:  anemia  HISTORY OF PRESENTING ILLNESS:  Anita Gonzalez 69 y.o. female with history of DM 2, HTN, HLD, asthma, rheumatoid arthritis, former smoker, recently moved to this area from [place], is here because of anemia.   She presented to the ED on 01/06/2059 with fever, chills, productive cough, dyspnea, wheezing, chest pain and poor appetite of ~ 1 week duration. In the ED, febrile to 102.47F, tachycardic, WBC 12.5 and chest x-ray suggestive of right lower lobe pneumonia. Treating for CAP, then developed acute kidney injury-improving and narrow complex tachycardia 2/27. Her clinical condition improved and she was discharged home with oxygen on 01/11/2015. During her hospital stay, she was found to have moderate anemia with hemoglobin 8-9, low serum iron and ferritin level. She was referred to our clinic for further evaluation.  She was found to have abnormal CBC from 2011, and has been on iron pill two pill daily, and B12 injection and pill for the past two years. She had EGD, capsule endoscopy and colonoscopy in [place] a few years ago by Dr. Peggye Form, which was normal per pt.   She denies recent chest pain on exertion, (+) shortness of breath on exertion at night, no pre-syncopal episodes, or palpitations. She had not noticed any recent bleeding such as epistaxis, hematuria or hematochezia The patient denies over the counter NSAID ingestion. She is not on antiplatelets agents. She had no prior history or diagnosis of cancer. Her age appropriate screening programs are up-to-date. She denies any pica and eats a variety of diet. She never donated blood or received blood transfusion  She is still on oxygen. She has some dyspnea at night. No orthopnea. She is able to do  all her ADLs and light housework. She was not very physically active even before the hospitalization last month.  MEDICAL HISTORY:  Past Medical History  Diagnosis Date  . Diabetes mellitus without complication   . Asthma   . Arthritis   . Hypertension     SURGICAL HISTORY: Past Surgical History  Procedure Laterality Date  . Cesarean section    . Hernia repair    . Hemorrhoid surgery      SOCIAL HISTORY: History   Social History  . Marital Status: Single    Spouse Name: N/A  . Number of Children: N/A  . Years of Education: N/A   Occupational History  . Not on file.   Social History Main Topics  . Smoking status: Former Research scientist (life sciences)  . Smokeless tobacco: Not on file  . Alcohol Use: Yes     Comment: occ  . Drug Use: No  . Sexual Activity: Not on file   Other Topics Concern  . Not on file   Social History Narrative    FAMILY HISTORY: No family history on file.  ALLERGIES:  is allergic to fish allergy; iodinated diagnostic agents; peanuts; penicillins; percocet; and prednisone.  MEDICATIONS:  Current Outpatient Prescriptions  Medication Sig Dispense Refill  . albuterol (PROVENTIL HFA;VENTOLIN HFA) 108 (90 BASE) MCG/ACT inhaler Inhale 2 puffs into the lungs every 6 (six) hours as needed for wheezing or shortness of breath (wheezing).     . bumetanide (BUMEX) 2 MG tablet Take 2 mg by mouth 2 (two) times a week.    . clobetasol (TEMOVATE) 0.05 % GEL Apply 1 application topically 2 (  two) times daily. 30 each 0  . cyanocobalamin 1000 MCG tablet Take 100 mcg by mouth once a week.    . cyclobenzaprine (FLEXERIL) 10 MG tablet Take 10 mg by mouth daily.    . diclofenac sodium (VOLTAREN) 1 % GEL Apply 2 g topically 2 (two) times daily.    . ergocalciferol (VITAMIN D2) 50000 UNITS capsule Take 50,000 Units by mouth once a week.    . ferrous sulfate 325 (65 FE) MG tablet Take 1 tablet (325 mg total) by mouth 2 (two) times daily with a meal. 60 tablet 3  . Fluticasone-Salmeterol  (ADVAIR) 500-50 MCG/DOSE AEPB Inhale 2 puffs into the lungs daily. 3 each 0  . folic acid (FOLVITE) 1 MG tablet Take 1 mg by mouth daily.    . metFORMIN (GLUCOPHAGE) 1000 MG tablet Take 500 mg by mouth 2 (two) times daily.    . metoprolol tartrate (LOPRESSOR) 25 MG tablet Take 1 tablet (25 mg total) by mouth 2 (two) times daily. 60 tablet 0  . montelukast (SINGULAIR) 10 MG tablet Take 10 mg by mouth at bedtime.    . Multiple Vitamin (MULTIVITAMIN) tablet Take 1 tablet by mouth daily.    Marland Kitchen omeprazole (PRILOSEC) 20 MG capsule Take 20 mg by mouth daily.    . potassium chloride (K-DUR,KLOR-CON) 10 MEQ tablet Take 10 mEq by mouth every other day.     . simvastatin (ZOCOR) 20 MG tablet Take 20 mg by mouth daily.    . verapamil (VERELAN PM) 360 MG 24 hr capsule Take 1 capsule (360 mg total) by mouth daily. 30 capsule 0  . traZODone (DESYREL) 50 MG tablet Take 50 mg by mouth at bedtime.      No current facility-administered medications for this visit.    REVIEW OF SYSTEMS:   Constitutional: Denies fevers, chills or abnormal night sweats, (+) fatigue Eyes: Denies blurriness of vision, double vision or watery eyes Ears, nose, mouth, throat, and face: Denies mucositis or sore throat Respiratory: Denies cough, (+) dyspnea, no wheezes Cardiovascular: Denies palpitation, chest discomfort or lower extremity swelling Gastrointestinal:  Denies nausea, heartburn or change in bowel habits Skin: Denies abnormal skin rashes Lymphatics: Denies new lymphadenopathy or easy bruising Neurological:Denies numbness, tingling or new weaknesses Behavioral/Psych: Mood is stable, no new changes  All other systems were reviewed with the patient and are negative.  PHYSICAL EXAMINATION: ECOG PERFORMANCE STATUS: 2 - Symptomatic, <50% confined to bed  Filed Vitals:   01/28/15 1358  BP: 125/49  Pulse: 79  Temp: 98.4 F (36.9 C)  Resp: 18   Filed Weights   01/28/15 1358  Weight: 219 lb 1.6 oz (99.383 kg)     GENERAL:alert, no distress and comfortable SKIN: skin color, texture, turgor are normal, no rashes or significant lesions EYES: normal, conjunctiva are pink and non-injected, sclera clear OROPHARYNX:no exudate, no erythema and lips, buccal mucosa, and tongue normal  NECK: supple, thyroid normal size, non-tender, without nodularity LYMPH:  no palpable lymphadenopathy in the cervical, axillary or inguinal LUNGS: clear to auscultation and percussion with normal breathing effort HEART: regular rate & rhythm and no murmurs and no lower extremity edema ABDOMEN:abdomen soft, non-tender and normal bowel sounds Musculoskeletal:no cyanosis of digits and no clubbing  PSYCH: alert & oriented x 3 with fluent speech NEURO: no focal motor/sensory deficits  LABORATORY DATA:  I have reviewed the data as listed CBC Latest Ref Rng 01/11/2015 01/10/2015 01/09/2015  WBC 4.0 - 10.5 K/uL 13.8(H) 14.8(H) 16.3(H)  Hemoglobin 12.0 - 15.0  g/dL 8.4(L) 8.0(L) 7.5(L)  Hematocrit 36.0 - 46.0 % 28.4(L) 27.6(L) 26.2(L)  Platelets 150 - 400 K/uL 497(H) 498(H) 454(H)    CMP Latest Ref Rng 01/11/2015 01/10/2015 01/09/2015  Glucose 70 - 99 mg/dL 104(H) 198(H) 133(H)  BUN 6 - 23 mg/dL 37(H) 34(H) 32(H)  Creatinine 0.50 - 1.10 mg/dL 1.44(H) 1.26(H) 1.13(H)  Sodium 135 - 145 mmol/L 142 136 138  Potassium 3.5 - 5.1 mmol/L 4.1 4.6 5.0  Chloride 96 - 112 mmol/L 99 99 104  CO2 19 - 32 mmol/L 33(H) 31 30  Calcium 8.4 - 10.5 mg/dL 9.3 9.3 9.0  Total Protein 6.0 - 8.3 g/dL - - -  Total Bilirubin 0.3 - 1.2 mg/dL - - -  Alkaline Phos 39 - 117 U/L - - -  AST 0 - 37 U/L - - -  ALT 0 - 35 U/L - - -     RADIOGRAPHIC STUDIES: I have personally reviewed the radiological images as listed and agreed with the findings in the report. Dg Chest Portable 1 View  01/06/2015   CLINICAL DATA:  One week history of difficulty breathing  EXAM: PORTABLE CHEST - 1 VIEW  COMPARISON:  None.  FINDINGS: There is airspace consolidation in the right  lower lobe. Lungs elsewhere clear. Heart size and pulmonary vascularity are normal. No adenopathy. No bone lesions.  IMPRESSION: Right lower lobe airspace consolidation.   Electronically Signed   By: Lowella Grip III M.D.   On: 01/06/2015 16:03    ASSESSMENT & PLAN: 69 year old African-American female with past medical history of diabetes, hypertension, rheumatoid arthritis, who has chronic anemia for a few years.  1. Normocytic anemia secondary to iron deficiency -She has moderate anemia with hemoglobin in 8-9 range, MCV normal, ferritin 16, serum iron 13, URBC elevated at 430, iron saturation 3%, this is consistent with iron deficient anemia -Anemia has been a few years, likely secondary to slow GI bleeding. Her stool OB was positive. Colonoscopy, EGD, capsule endoscopy a few years ago was negative per patient -She also may have a component of anemia of chronic disease secondary to kidney disease and rheumatoid arthritis -She is not responding to oral iron supplement, I recommend IV Feraheme 510 mg twice in the next few weeks. Benefit and side effects discussed with patient and she agrees to proceed. -We'll also check SPEP/UPEP with immunofixation to ruled out multiple myeloma. -If she does not respond to IV iron, then we will do more anemia workup.  2. Hypertension, diabetes, rheumatoid arthritis -She will continue follow-up with her primary care physician.  Plan -IV feraheme in the next two weeks -i will get your endoscopy reports from Dr. Constance Holster -RTC in one month  All questions were answered. The patient knows to call the clinic with any problems, questions or concerns. I spent 30 minutes counseling the patient face to face. The total time spent in the appointment was 40 minutes and more than 50% was on counseling.     Truitt Merle, MD 01/28/2015 2:18 PM

## 2015-02-11 ENCOUNTER — Ambulatory Visit (HOSPITAL_BASED_OUTPATIENT_CLINIC_OR_DEPARTMENT_OTHER): Payer: Medicare HMO

## 2015-02-11 DIAGNOSIS — D509 Iron deficiency anemia, unspecified: Secondary | ICD-10-CM

## 2015-02-11 MED ORDER — SODIUM CHLORIDE 0.9 % IJ SOLN
3.0000 mL | Freq: Once | INTRAMUSCULAR | Status: DC | PRN
  Filled 2015-02-11: qty 10

## 2015-02-11 MED ORDER — SODIUM CHLORIDE 0.9 % IV SOLN
510.0000 mg | Freq: Once | INTRAVENOUS | Status: AC
  Administered 2015-02-11: 510 mg via INTRAVENOUS
  Filled 2015-02-11: qty 17

## 2015-02-11 MED ORDER — SODIUM CHLORIDE 0.9 % IV SOLN
Freq: Once | INTRAVENOUS | Status: AC
  Administered 2015-02-11: 09:00:00 via INTRAVENOUS

## 2015-02-11 NOTE — Patient Instructions (Signed)

## 2015-02-25 ENCOUNTER — Encounter: Payer: Self-pay | Admitting: Hematology

## 2015-02-25 ENCOUNTER — Other Ambulatory Visit: Payer: Self-pay | Admitting: *Deleted

## 2015-02-25 ENCOUNTER — Telehealth: Payer: Self-pay | Admitting: Hematology

## 2015-02-25 ENCOUNTER — Ambulatory Visit (HOSPITAL_BASED_OUTPATIENT_CLINIC_OR_DEPARTMENT_OTHER): Payer: Medicare HMO | Admitting: Hematology

## 2015-02-25 ENCOUNTER — Institutional Professional Consult (permissible substitution): Payer: Medicare HMO | Admitting: Internal Medicine

## 2015-02-25 ENCOUNTER — Ambulatory Visit (HOSPITAL_BASED_OUTPATIENT_CLINIC_OR_DEPARTMENT_OTHER): Payer: Medicare HMO

## 2015-02-25 VITALS — BP 118/40 | HR 84 | Temp 98.2°F | Resp 21 | Ht 61.0 in | Wt 216.4 lb

## 2015-02-25 DIAGNOSIS — D509 Iron deficiency anemia, unspecified: Secondary | ICD-10-CM | POA: Diagnosis not present

## 2015-02-25 DIAGNOSIS — E119 Type 2 diabetes mellitus without complications: Secondary | ICD-10-CM

## 2015-02-25 DIAGNOSIS — D649 Anemia, unspecified: Secondary | ICD-10-CM

## 2015-02-25 DIAGNOSIS — I10 Essential (primary) hypertension: Secondary | ICD-10-CM | POA: Diagnosis not present

## 2015-02-25 DIAGNOSIS — D500 Iron deficiency anemia secondary to blood loss (chronic): Secondary | ICD-10-CM

## 2015-02-25 LAB — CBC & DIFF AND RETIC
BASO%: 0.2 % (ref 0.0–2.0)
BASOS ABS: 0 10*3/uL (ref 0.0–0.1)
EOS%: 2.5 % (ref 0.0–7.0)
Eosinophils Absolute: 0.1 10*3/uL (ref 0.0–0.5)
HCT: 32.9 % — ABNORMAL LOW (ref 34.8–46.6)
HEMOGLOBIN: 9.8 g/dL — AB (ref 11.6–15.9)
Immature Retic Fract: 14 % — ABNORMAL HIGH (ref 1.60–10.00)
LYMPH%: 31.8 % (ref 14.0–49.7)
MCH: 26.3 pg (ref 25.1–34.0)
MCHC: 29.8 g/dL — ABNORMAL LOW (ref 31.5–36.0)
MCV: 88.2 fL (ref 79.5–101.0)
MONO#: 0.4 10*3/uL (ref 0.1–0.9)
MONO%: 6.5 % (ref 0.0–14.0)
NEUT#: 3.3 10*3/uL (ref 1.5–6.5)
NEUT%: 59 % (ref 38.4–76.8)
Platelets: 323 10*3/uL (ref 145–400)
RBC: 3.73 10*6/uL (ref 3.70–5.45)
RDW: 21.7 % — ABNORMAL HIGH (ref 11.2–14.5)
RETIC %: 3.63 % — AB (ref 0.70–2.10)
RETIC CT ABS: 135.4 10*3/uL — AB (ref 33.70–90.70)
WBC: 5.7 10*3/uL (ref 3.9–10.3)
lymph#: 1.8 10*3/uL (ref 0.9–3.3)
nRBC: 0 % (ref 0–0)

## 2015-02-25 LAB — COMPREHENSIVE METABOLIC PANEL (CC13)
ALBUMIN: 3.7 g/dL (ref 3.5–5.0)
ALK PHOS: 90 U/L (ref 40–150)
ALT: 23 U/L (ref 0–55)
AST: 24 U/L (ref 5–34)
Anion Gap: 9 mEq/L (ref 3–11)
BILIRUBIN TOTAL: 0.32 mg/dL (ref 0.20–1.20)
BUN: 11.2 mg/dL (ref 7.0–26.0)
CALCIUM: 8.8 mg/dL (ref 8.4–10.4)
CO2: 30 mEq/L — ABNORMAL HIGH (ref 22–29)
Chloride: 103 mEq/L (ref 98–109)
Creatinine: 1 mg/dL (ref 0.6–1.1)
EGFR: 71 mL/min/{1.73_m2} — ABNORMAL LOW (ref >90)
Glucose: 89 mg/dl (ref 70–140)
Potassium: 4 mEq/L (ref 3.5–5.1)
Sodium: 142 mEq/L (ref 136–145)
Total Protein: 6.8 g/dL (ref 6.4–8.3)

## 2015-02-25 LAB — LACTATE DEHYDROGENASE (CC13): LDH: 168 U/L (ref 125–245)

## 2015-02-25 NOTE — Telephone Encounter (Signed)
appointments made and avs printed for patient °

## 2015-02-25 NOTE — Progress Notes (Signed)
[facility]  Telephone:(336) [phone] Fax:(336) [phone]  Clinic New consult Note   Patient Care Team: Jearld Lesch, MD as Referring Physician (Specialist) Jearld Lesch, MD as Referring Physician (Specialist) 02/25/2015  CHIEF COMPLAINTS/PURPOSE OF CONSULTATION:  anemia  HISTORY OF PRESENTING ILLNESS:  Anita Gonzalez 69 y.o. female with history of DM 2, HTN, HLD, asthma, rheumatoid arthritis, former smoker, recently moved to this area from [place], is here because of anemia.   She presented to the ED on 01/06/2059 with fever, chills, productive cough, dyspnea, wheezing, chest pain and poor appetite of ~ 1 week duration. In the ED, febrile to 102.52F, tachycardic, WBC 12.5 and chest x-ray suggestive of right lower lobe pneumonia. Treating for CAP, then developed acute kidney injury-improving and narrow complex tachycardia 2/27. Her clinical condition improved and she was discharged home with oxygen on 01/11/2015. During her hospital stay, she was found to have moderate anemia with hemoglobin 8-9, low serum iron and ferritin level. She was referred to our clinic for further evaluation.  She was found to have abnormal CBC from 2011, and has been on iron pill two pill daily, and B12 injection and pill for the past two years. She had EGD, capsule endoscopy and colonoscopy in [place] a few years ago by Dr. Francella Solian, which was normal per pt.   She denies recent chest pain on exertion, (+) shortness of breath on exertion at night, no pre-syncopal episodes, or palpitations. She had not noticed any recent bleeding such as epistaxis, hematuria or hematochezia The patient denies over the counter NSAID ingestion. She is not on antiplatelets agents. She had no prior history or diagnosis of cancer. Her age appropriate screening programs are up-to-date. She denies any pica and eats a variety of diet. She never donated blood or received blood transfusion  She is still on oxygen. She  has some dyspnea at night. No orthopnea. She is able to do all her ADLs and light housework. She was not very physically active even before the hospitalization last month.  INTERIM HISTORY: Anita Gonzalez returns for follow-up. She received 1 dose of IV Feraheme 510 mg 2 weeks ago, but was not scheduled for a second dose for some reason. She tolerated well but did not feel significant improvement after the infusion. She otherwise feel about the same. She is scheduled to have a colonoscopy next week.  MEDICAL HISTORY:  Past Medical History  Diagnosis Date  . Diabetes mellitus without complication   . Asthma   . Arthritis   . Hypertension   . Rheumatoid arthritis 1996    SURGICAL HISTORY: Past Surgical History  Procedure Laterality Date  . Cesarean section    . Hernia repair    . Hemorrhoid surgery    . Cesarean section    . Hernia repair      SOCIAL HISTORY: History   Social History  . Marital Status: Single    Spouse Name: N/A  . Number of Children: N/A  . Years of Education: N/A   Occupational History  . Not on file.   Social History Main Topics  . Smoking status: Former Smoker -- 0.50 packs/day for 25 years    Quit date: 01/12/2011  . Smokeless tobacco: Not on file  . Alcohol Use: Yes     Comment: occ  . Drug Use: No  . Sexual Activity: Not on file   Other Topics Concern  . Not on file   Social History Narrative    FAMILY HISTORY: Family  History  Problem Relation Age of Onset  . Diabetes Mother   . Heart failure Mother   . Cancer Brother     throat cancer     ALLERGIES:  is allergic to fish allergy; iodinated diagnostic agents; peanuts; penicillins; percocet; and prednisone.  MEDICATIONS:  Current Outpatient Prescriptions  Medication Sig Dispense Refill  . albuterol (PROVENTIL HFA;VENTOLIN HFA) 108 (90 BASE) MCG/ACT inhaler Inhale 2 puffs into the lungs every 6 (six) hours as needed for wheezing or shortness of breath (wheezing).     . bumetanide  (BUMEX) 2 MG tablet Take 2 mg by mouth 2 (two) times a week.    . clobetasol (TEMOVATE) 0.05 % GEL Apply 1 application topically 2 (two) times daily. 30 each 0  . cyanocobalamin 1000 MCG tablet Take 100 mcg by mouth once a week.    . cyclobenzaprine (FLEXERIL) 10 MG tablet Take 10 mg by mouth daily.    . diclofenac sodium (VOLTAREN) 1 % GEL Apply 2 g topically 2 (two) times daily.    . diphenoxylate-atropine (LOMOTIL) 2.5-0.025 MG per tablet     . ergocalciferol (VITAMIN D2) 50000 UNITS capsule Take 50,000 Units by mouth once a week.    . ferrous sulfate 325 (65 FE) MG tablet Take 1 tablet (325 mg total) by mouth 2 (two) times daily with a meal. 60 tablet 3  . Fluticasone-Salmeterol (ADVAIR) 500-50 MCG/DOSE AEPB Inhale 2 puffs into the lungs daily. 3 each 0  . folic acid (FOLVITE) 1 MG tablet Take 1 mg by mouth daily.    . metFORMIN (GLUCOPHAGE) 1000 MG tablet Take 500 mg by mouth 2 (two) times daily.    . metoprolol tartrate (LOPRESSOR) 25 MG tablet Take 1 tablet (25 mg total) by mouth 2 (two) times daily. 60 tablet 0  . montelukast (SINGULAIR) 10 MG tablet Take 10 mg by mouth at bedtime.    . Multiple Vitamin (MULTIVITAMIN) tablet Take 1 tablet by mouth daily.    Marland Kitchen omeprazole (PRILOSEC) 20 MG capsule Take 20 mg by mouth daily.    . potassium chloride (K-DUR,KLOR-CON) 10 MEQ tablet Take 10 mEq by mouth every other day.     . simvastatin (ZOCOR) 20 MG tablet Take 20 mg by mouth daily.    . traZODone (DESYREL) 50 MG tablet Take 50 mg by mouth at bedtime.     . verapamil (VERELAN PM) 360 MG 24 hr capsule Take 1 capsule (360 mg total) by mouth daily. 30 capsule 0  . colchicine 0.6 MG tablet     . meloxicam (MOBIC) 7.5 MG tablet      No current facility-administered medications for this visit.    REVIEW OF SYSTEMS:   Constitutional: Denies fevers, chills or abnormal night sweats, (+) fatigue Eyes: Denies blurriness of vision, double vision or watery eyes Ears, nose, mouth, throat, and face:  Denies mucositis or sore throat Respiratory: Denies cough, (+) dyspnea, no wheezes Cardiovascular: Denies palpitation, chest discomfort or lower extremity swelling Gastrointestinal:  Denies nausea, heartburn or change in bowel habits Skin: Denies abnormal skin rashes Lymphatics: Denies new lymphadenopathy or easy bruising Neurological:Denies numbness, tingling or new weaknesses Behavioral/Psych: Mood is stable, no new changes  All other systems were reviewed with the patient and are negative.  PHYSICAL EXAMINATION: ECOG PERFORMANCE STATUS: 2 - Symptomatic, <50% confined to bed  Filed Vitals:   02/25/15 1445  BP: 118/40  Pulse: 84  Temp: 98.2 F (36.8 C)  Resp: 21   Filed Weights   02/25/15 1445  Weight: 216 lb 6.4 oz (98.158 kg)    GENERAL:alert, no distress and comfortable SKIN: skin color, texture, turgor are normal, no rashes or significant lesions EYES: normal, conjunctiva are pink and non-injected, sclera clear OROPHARYNX:no exudate, no erythema and lips, buccal mucosa, and tongue normal  NECK: supple, thyroid normal size, non-tender, without nodularity LYMPH:  no palpable lymphadenopathy in the cervical, axillary or inguinal LUNGS: clear to auscultation and percussion with normal breathing effort HEART: regular rate & rhythm and no murmurs and no lower extremity edema ABDOMEN:abdomen soft, non-tender and normal bowel sounds Musculoskeletal:no cyanosis of digits and no clubbing  PSYCH: alert & oriented x 3 with fluent speech NEURO: no focal motor/sensory deficits  LABORATORY DATA:  I have reviewed the data as listed CBC Latest Ref Rng 02/25/2015 01/11/2015 01/10/2015  WBC 3.9 - 10.3 10e3/uL 5.7 13.8(H) 14.8(H)  Hemoglobin 11.6 - 15.9 g/dL 4.9(E) 0.1(E) 0.7(H)  Hematocrit 34.8 - 46.6 % 32.9(L) 28.4(L) 27.6(L)  Platelets 145 - 400 10e3/uL 323 497(H) 498(H)    CMP Latest Ref Rng 01/11/2015 01/10/2015 01/09/2015  Glucose 70 - 99 mg/dL 219(X) 588(T) 254(D)  BUN 6 - 23  mg/dL 82(M) 41(R) 83(E)  Creatinine 0.50 - 1.10 mg/dL 9.40(H) 6.80(S) 8.11(S)  Sodium 135 - 145 mmol/L 142 136 138  Potassium 3.5 - 5.1 mmol/L 4.1 4.6 5.0  Chloride 96 - 112 mmol/L 99 99 104  CO2 19 - 32 mmol/L 33(H) 31 30  Calcium 8.4 - 10.5 mg/dL 9.3 9.3 9.0  Total Protein 6.0 - 8.3 g/dL - - -  Total Bilirubin 0.3 - 1.2 mg/dL - - -  Alkaline Phos 39 - 117 U/L - - -  AST 0 - 37 U/L - - -  ALT 0 - 35 U/L - - -     RADIOGRAPHIC STUDIES: I have personally reviewed the radiological images as listed and agreed with the findings in the report. No results found.  ASSESSMENT & PLAN: 69 year old African-American female with past medical history of diabetes, hypertension, rheumatoid arthritis, who has chronic anemia for a few years.  1. Normocytic anemia secondary to iron deficiency -She has moderate anemia with hemoglobin in 8-9 range, MCV normal, ferritin 16, serum iron 13, URBC elevated at 430, iron saturation 3%, this is consistent with iron deficient anemia -Anemia has been a few years, likely secondary to slow GI bleeding. Her stool OB was positive. Colonoscopy, EGD, capsule endoscopy a few years ago was negative per patient, and she is scheduled to have a repeated colonoscopy next week. -She also may have a component of anemia of chronic disease secondary to kidney disease and rheumatoid arthritis -Her hemoglobin improved, hemoglobin 9.8 today. I'll set up her second Feraheme infusion in one week. -Her SPEP/UPEP with immunofixation is still pending.   2. Hypertension, diabetes, rheumatoid arthritis -She will continue follow-up with her primary care physician.  Plan -Second dose IV feraheme next week -RTC in one month  All questions were answered. The patient knows to call the clinic with any problems, questions or concerns. I spent 20 minutes counseling the patient face to face. The total time spent in the appointment was 25 minutes and more than 50% was on counseling.     Malachy Mood, MD 02/25/2015 3:36 PM

## 2015-02-26 LAB — IRON AND TIBC CHCC
%SAT: 12 % — ABNORMAL LOW (ref 21–57)
Iron: 44 ug/dL (ref 41–142)
TIBC: 361 ug/dL (ref 236–444)
UIBC: 317 ug/dL (ref 120–384)

## 2015-02-26 LAB — FERRITIN CHCC: FERRITIN: 142 ng/mL (ref 9–269)

## 2015-03-01 ENCOUNTER — Institutional Professional Consult (permissible substitution): Payer: Medicare HMO | Admitting: Internal Medicine

## 2015-03-01 LAB — SPEP & IFE WITH QIG
ALBUMIN ELP: 3.7 g/dL — AB (ref 3.8–4.8)
ALPHA-1-GLOBULIN: 0.4 g/dL — AB (ref 0.2–0.3)
ALPHA-2-GLOBULIN: 1 g/dL — AB (ref 0.5–0.9)
BETA GLOBULIN: 0.5 g/dL (ref 0.4–0.6)
Beta 2: 0.4 g/dL (ref 0.2–0.5)
Gamma Globulin: 0.8 g/dL (ref 0.8–1.7)
IgA: 228 mg/dL (ref 69–380)
IgG (Immunoglobin G), Serum: 817 mg/dL (ref 690–1700)
IgM, Serum: 77 mg/dL (ref 52–322)
TOTAL PROTEIN, SERUM ELECTROPHOR: 6.7 g/dL (ref 6.1–8.1)

## 2015-03-05 ENCOUNTER — Ambulatory Visit: Payer: Medicare HMO

## 2015-03-11 ENCOUNTER — Other Ambulatory Visit: Payer: Self-pay | Admitting: Gastroenterology

## 2015-03-11 ENCOUNTER — Telehealth: Payer: Self-pay | Admitting: Hematology

## 2015-03-11 NOTE — Telephone Encounter (Signed)
Confirm Fera on 05/13.

## 2015-03-24 ENCOUNTER — Encounter (HOSPITAL_COMMUNITY): Payer: Self-pay | Admitting: *Deleted

## 2015-03-25 ENCOUNTER — Ambulatory Visit: Payer: Medicare HMO | Admitting: Hematology

## 2015-03-26 ENCOUNTER — Other Ambulatory Visit (HOSPITAL_BASED_OUTPATIENT_CLINIC_OR_DEPARTMENT_OTHER): Payer: Medicare HMO

## 2015-03-26 ENCOUNTER — Telehealth: Payer: Self-pay | Admitting: Hematology

## 2015-03-26 ENCOUNTER — Encounter: Payer: Self-pay | Admitting: Hematology

## 2015-03-26 ENCOUNTER — Ambulatory Visit (HOSPITAL_BASED_OUTPATIENT_CLINIC_OR_DEPARTMENT_OTHER): Payer: Medicare HMO | Admitting: Hematology

## 2015-03-26 ENCOUNTER — Ambulatory Visit (HOSPITAL_BASED_OUTPATIENT_CLINIC_OR_DEPARTMENT_OTHER): Payer: Medicare HMO

## 2015-03-26 VITALS — BP 122/60 | HR 83 | Temp 97.4°F | Resp 18

## 2015-03-26 VITALS — BP 133/49 | HR 76 | Temp 98.4°F | Resp 18 | Ht 61.0 in | Wt 218.0 lb

## 2015-03-26 DIAGNOSIS — D509 Iron deficiency anemia, unspecified: Secondary | ICD-10-CM

## 2015-03-26 DIAGNOSIS — D500 Iron deficiency anemia secondary to blood loss (chronic): Secondary | ICD-10-CM

## 2015-03-26 DIAGNOSIS — E119 Type 2 diabetes mellitus without complications: Secondary | ICD-10-CM

## 2015-03-26 DIAGNOSIS — D649 Anemia, unspecified: Secondary | ICD-10-CM

## 2015-03-26 LAB — CBC & DIFF AND RETIC
BASO%: 0.4 % (ref 0.0–2.0)
Basophils Absolute: 0 10*3/uL (ref 0.0–0.1)
EOS%: 3.2 % (ref 0.0–7.0)
Eosinophils Absolute: 0.2 10*3/uL (ref 0.0–0.5)
HCT: 34.2 % — ABNORMAL LOW (ref 34.8–46.6)
HGB: 10.6 g/dL — ABNORMAL LOW (ref 11.6–15.9)
Immature Retic Fract: 14.5 % — ABNORMAL HIGH (ref 1.60–10.00)
LYMPH#: 1.7 10*3/uL (ref 0.9–3.3)
LYMPH%: 31.2 % (ref 14.0–49.7)
MCH: 27.1 pg (ref 25.1–34.0)
MCHC: 31 g/dL — ABNORMAL LOW (ref 31.5–36.0)
MCV: 87.5 fL (ref 79.5–101.0)
MONO#: 0.4 10*3/uL (ref 0.1–0.9)
MONO%: 7.8 % (ref 0.0–14.0)
NEUT%: 57.4 % (ref 38.4–76.8)
NEUTROS ABS: 3.1 10*3/uL (ref 1.5–6.5)
Platelets: 310 10*3/uL (ref 145–400)
RBC: 3.91 10*6/uL (ref 3.70–5.45)
RDW: 20.1 % — AB (ref 11.2–14.5)
RETIC %: 1.93 % (ref 0.70–2.10)
RETIC CT ABS: 75.46 10*3/uL (ref 33.70–90.70)
WBC: 5.4 10*3/uL (ref 3.9–10.3)
nRBC: 0 % (ref 0–0)

## 2015-03-26 MED ORDER — SODIUM CHLORIDE 0.9 % IV SOLN
510.0000 mg | Freq: Once | INTRAVENOUS | Status: AC
  Administered 2015-03-26: 510 mg via INTRAVENOUS
  Filled 2015-03-26: qty 17

## 2015-03-26 MED ORDER — SODIUM CHLORIDE 0.9 % IV SOLN
Freq: Once | INTRAVENOUS | Status: AC
  Administered 2015-03-26: 14:00:00 via INTRAVENOUS

## 2015-03-26 NOTE — Telephone Encounter (Signed)
Added appointments for July and September. Spoke with relative and patient to get new schedule at next appointment 04/02/15.

## 2015-03-26 NOTE — Patient Instructions (Signed)
[facility] Discharge Instructions for Patients Receiving Treatment Today you received the following chemotherapy agents Fereheme  To help prevent nausea and vomiting after your treatment, we encourage you to take your nausea medication as directed  If you develop nausea and vomiting that is not controlled by your nausea medication, call the clinic.   BELOW ARE SYMPTOMS THAT SHOULD BE REPORTED IMMEDIATELY:  *FEVER GREATER THAN 100.5 F  *CHILLS WITH OR WITHOUT FEVER  NAUSEA AND VOMITING THAT IS NOT CONTROLLED WITH YOUR NAUSEA MEDICATION  *UNUSUAL SHORTNESS OF BREATH  *UNUSUAL BRUISING OR BLEEDING  TENDERNESS IN MOUTH AND THROAT WITH OR WITHOUT PRESENCE OF ULCERS  *URINARY PROBLEMS  *BOWEL PROBLEMS  UNUSUAL RASH Items with * indicate a potential emergency and should be followed up as soon as possible.  Feel free to call the clinic you have any questions or concerns. The clinic phone number is [phone].  Please show the CHEMO ALERT CARD at check-in to the Emergency Department and triage nurse.

## 2015-03-26 NOTE — Progress Notes (Signed)
[facility]  Telephone:(336) [phone] Fax:(336) [phone]  Clinic New consult Note   Patient Care Team: [name], MD as PCP - General (Internal Medicine) Jearld Lesch, MD as Referring Physician (Specialist) Jearld Lesch, MD as Referring Physician (Specialist) 03/26/2015  CHIEF COMPLAINT:  Follow-up iron deficient anemia  HISTORY OF PRESENTING ILLNESS:  Anita Gonzalez 69 y.o. female with history of DM 2, HTN, HLD, asthma, rheumatoid arthritis, former smoker, recently moved to this area from [place], is here because of anemia.   She presented to the ED on 01/06/2015 with fever, chills, productive cough, dyspnea, wheezing, chest pain and poor appetite of ~ 1 week duration. In the ED, febrile to 102.33F, tachycardic, WBC 12.5 and chest x-ray suggestive of right lower lobe pneumonia. Treating for CAP, then developed acute kidney injury-improving and narrow complex tachycardia 2/27. Her clinical condition improved and she was discharged home with oxygen on 01/11/2015. During her hospital stay, she was found to have moderate anemia with hemoglobin 8-9, low serum iron and ferritin level. She was referred to our clinic for further evaluation.  She was found to have abnormal CBC from 2011, and has been on iron pill two pill daily, and B12 injection and pill for the past two years. She had EGD, capsule endoscopy and colonoscopy in [place] a few years ago by Dr. Francella Solian, which was normal per pt.   She denies recent chest pain on exertion, (+) shortness of breath on exertion at night, no pre-syncopal episodes, or palpitations. She had not noticed any recent bleeding such as epistaxis, hematuria or hematochezia The patient denies over the counter NSAID ingestion. She is not on antiplatelets agents. She had no prior history or diagnosis of cancer. Her age appropriate screening programs are up-to-date. She denies any pica and eats a variety of diet. She never donated blood or  received blood transfusion  She is still on oxygen. She has some dyspnea at night. No orthopnea. She is able to do all her ADLs and light housework. She was not very physically active even before the hospitalization last month.  INTERIM HISTORY: [name] returns for follow-up, she is accompanied by her daughter and grandson son. She feels about the same, mild to moderate fatigue, she does have moderate pain at his left knee, is able to move around with a cane. She comes in a wheelchair today. She denies any significant chest pain, his energy level has not changed much since she received IV Feraheme. She complains intermittent leg cramps, no other new complaints. No bleeding or hematochezia. She is scheduled to have a colonoscopy and EGD next months.  MEDICAL HISTORY:  Past Medical History  Diagnosis Date  . Diabetes mellitus without complication   . Asthma   . Arthritis   . Hypertension   . Rheumatoid arthritis 1996  . GERD (gastroesophageal reflux disease)   . Anemia     SURGICAL HISTORY: Past Surgical History  Procedure Laterality Date  . Cesarean section    . Hernia repair    . Hemorrhoid surgery    . Cesarean section    . Hernia repair    . Abdominal hysterectomy      SOCIAL HISTORY: History   Social History  . Marital Status: Single    Spouse Name: N/A  . Number of Children: N/A  . Years of Education: N/A   Occupational History  . Not on file.   Social History Main Topics  . Smoking status: Former Smoker -- 0.50 packs/day  for 25 years    Quit date: 01/12/2011  . Smokeless tobacco: Not on file  . Alcohol Use: Yes     Comment: occ  . Drug Use: No  . Sexual Activity: Not on file   Other Topics Concern  . Not on file   Social History Narrative    FAMILY HISTORY: Family History  Problem Relation Age of Onset  . Diabetes Mother   . Heart failure Mother   . Cancer Brother     throat cancer     ALLERGIES:  is allergic to fish allergy; peanuts; ferrous  sulfate; iodinated diagnostic agents; penicillins; and percocet.  MEDICATIONS:  Current Outpatient Prescriptions  Medication Sig Dispense Refill  . albuterol (PROVENTIL HFA;VENTOLIN HFA) 108 (90 BASE) MCG/ACT inhaler Inhale 2 puffs into the lungs every 6 (six) hours as needed for wheezing or shortness of breath (wheezing).     . bumetanide (BUMEX) 2 MG tablet Take 2 mg by mouth 2 (two) times a week. Monday and Thursday.    . clobetasol (TEMOVATE) 0.05 % GEL Apply 1 application topically 2 (two) times daily. 30 each 0  . cyanocobalamin 1000 MCG tablet Take 100 mcg by mouth once a week. Monday    . cyclobenzaprine (FLEXERIL) 10 MG tablet Take 10 mg by mouth every morning.     . diclofenac sodium (VOLTAREN) 1 % GEL Apply 2 g topically 2 (two) times daily.    . ergocalciferol (VITAMIN D2) 50000 UNITS capsule Take 50,000 Units by mouth once a week. Thursday.    . ferrous sulfate 325 (65 FE) MG tablet Take 1 tablet (325 mg total) by mouth 2 (two) times daily with a meal. 60 tablet 3  . fluticasone (FLONASE) 50 MCG/ACT nasal spray Place 1 spray into both nostrils daily as needed for allergies or rhinitis.    . Fluticasone-Salmeterol (ADVAIR) 500-50 MCG/DOSE AEPB Inhale 2 puffs into the lungs daily. 3 each 0  . folic acid (FOLVITE) 1 MG tablet Take 1 mg by mouth daily.    Marland Kitchen loperamide (IMODIUM A-D) 2 MG tablet Take 2 mg by mouth 4 (four) times daily as needed for diarrhea or loose stools.    . metFORMIN (GLUCOPHAGE) 1000 MG tablet Take 500 mg by mouth 2 (two) times daily.    . metoprolol tartrate (LOPRESSOR) 25 MG tablet Take 1 tablet (25 mg total) by mouth 2 (two) times daily. (Patient taking differently: Take 25 mg by mouth at bedtime. ) 60 tablet 0  . montelukast (SINGULAIR) 10 MG tablet Take 10 mg by mouth at bedtime.    . Multiple Vitamin (MULTIVITAMIN) tablet Take 1 tablet by mouth daily. With iron.    Marland Kitchen omeprazole (PRILOSEC) 20 MG capsule Take 20 mg by mouth daily.    . potassium chloride  (K-DUR,KLOR-CON) 10 MEQ tablet Take 10 mEq by mouth every other day.     . simvastatin (ZOCOR) 20 MG tablet Take 20 mg by mouth every morning.     . verapamil (VERELAN PM) 360 MG 24 hr capsule Take 1 capsule (360 mg total) by mouth daily. 30 capsule 0  . colchicine 0.6 MG tablet Take 0.6 mg by mouth daily.      No current facility-administered medications for this visit.    REVIEW OF SYSTEMS:   Constitutional: Denies fevers, chills or abnormal night sweats, (+) fatigue Eyes: Denies blurriness of vision, double vision or watery eyes Ears, nose, mouth, throat, and face: Denies mucositis or sore throat Respiratory: Denies cough, (+) dyspnea, no  wheezes Cardiovascular: Denies palpitation, chest discomfort or lower extremity swelling Gastrointestinal:  Denies nausea, heartburn or change in bowel habits Skin: Denies abnormal skin rashes Lymphatics: Denies new lymphadenopathy or easy bruising Neurological:Denies numbness, tingling or new weaknesses Behavioral/Psych: Mood is stable, no new changes  All other systems were reviewed with the patient and are negative.  PHYSICAL EXAMINATION: ECOG PERFORMANCE STATUS: 2 - Symptomatic, <50% confined to bed  Filed Vitals:   03/26/15 1238  BP: 133/49  Pulse: 76  Temp: 98.4 F (36.9 C)  Resp: 18   Filed Weights   03/26/15 1238  Weight: 218 lb (98.884 kg)    GENERAL:alert, no distress and comfortable SKIN: skin color, texture, turgor are normal, no rashes or significant lesions EYES: normal, conjunctiva are pink and non-injected, sclera clear OROPHARYNX:no exudate, no erythema and lips, buccal mucosa, and tongue normal  NECK: supple, thyroid normal size, non-tender, without nodularity LYMPH:  no palpable lymphadenopathy in the cervical, axillary or inguinal LUNGS: clear to auscultation and percussion with normal breathing effort HEART: regular rate & rhythm and no murmurs and no lower extremity edema ABDOMEN:abdomen soft, non-tender and  normal bowel sounds Musculoskeletal:no cyanosis of digits and no clubbing  PSYCH: alert & oriented x 3 with fluent speech NEURO: no focal motor/sensory deficits  LABORATORY DATA:  I have reviewed the data as listed CBC Latest Ref Rng 03/26/2015 02/25/2015 01/11/2015  WBC 3.9 - 10.3 10e3/uL 5.4 5.7 13.8(H)  Hemoglobin 11.6 - 15.9 g/dL 10.6(L) 9.8(L) 8.4(L)  Hematocrit 34.8 - 46.6 % 34.2(L) 32.9(L) 28.4(L)  Platelets 145 - 400 10e3/uL 310 323 497(H)    CMP Latest Ref Rng 02/25/2015 01/11/2015 01/10/2015  Glucose 70 - 140 mg/dl 89 786(V) 672(C)  BUN 7.0 - 26.0 mg/dL 94.7 09(G) 28(Z)  Creatinine 0.6 - 1.1 mg/dL 1.0 6.62(H) 4.76(L)  Sodium 136 - 145 mEq/L 142 142 136  Potassium 3.5 - 5.1 mEq/L 4.0 4.1 4.6  Chloride 96 - 112 mmol/L - 99 99  CO2 22 - 29 mEq/L 30(H) 33(H) 31  Calcium 8.4 - 10.4 mg/dL 8.8 9.3 9.3  Total Protein 6.4 - 8.3 g/dL 6.8 - -  Total Bilirubin 0.20 - 1.20 mg/dL 4.65 - -  Alkaline Phos 40 - 150 U/L 90 - -  AST 5 - 34 U/L 24 - -  ALT 0 - 55 U/L 23 - -   Ferritin  Status: Finalresult Visible to patient:  Not Released Nextappt: 04/02/2015 at 01:30 PM in Oncology ([facility]) Dx:  Anemia, unspecified anemia type           Ref Range 4wk ago  42mo ago     Ferritin 9 - 269 ng/ml 142 16R, CM          RADIOGRAPHIC STUDIES: I have personally reviewed the radiological images as listed and agreed with the findings in the report. No results found.  ASSESSMENT & PLAN: 69 year old African-American female with past medical history of diabetes, hypertension, rheumatoid arthritis, who has chronic anemia for a few years.  1. Normocytic anemia secondary to iron deficiency -She has moderate anemia with hemoglobin in 8-9 range, MCV normal, ferritin 16, serum iron 13, URBC elevated at 430, iron saturation 3%, this is consistent with iron deficient anemia -Anemia has been a few years, likely secondary to slow GI bleeding. Her stool OB was positive.  Colonoscopy, EGD, capsule endoscopy a few years ago was negative per patient, and she is scheduled to have a repeated colonoscopy next month.  -Her SPEP and UPEP were negative.  -  She also may have a component of anemia of chronic disease secondary to kidney disease and rheumatoid arthritis -Her hemoglobin improved, hemoglobin 10.6 today. She is scheduled to have second Feraheme infusion today. -She was taking iron by mouth for several years before, did not help with her anemia much.  2. Hypertension, diabetes, rheumatoid arthritis -She will continue follow-up with her primary care physician.  Plan -Second dose IV feraheme today -We'll check her CBC and iron level in 2 months, return to clinic in 4 months -IV Feraheme if ferritin less than 100 -I'll copy my note to her primary care physician and other specialists.  All questions were answered. The patient knows to call the clinic with any problems, questions or concerns. I spent 20 minutes counseling the patient face to face. The total time spent in the appointment was 25 minutes and more than 50% was on counseling.     Malachy Mood, MD 03/26/2015 1:10 PM

## 2015-03-31 ENCOUNTER — Telehealth: Payer: Self-pay | Admitting: Hematology

## 2015-03-31 NOTE — Telephone Encounter (Signed)
Returned patient call re rescheduling 5/20 fera inf due to colonoscopy. Spoke with patient re new appointment for 5/23 @ 1:45pm.

## 2015-04-01 LAB — UIFE/LIGHT CHAINS/TP QN, 24-HR UR
ALPHA 1 UR: DETECTED — AB
Albumin, U: DETECTED
Alpha 2, Urine: DETECTED — AB
Beta, Urine: DETECTED — AB
Gamma Globulin, Urine: DETECTED — AB
TIME-UPE24: 24 h
Volume, Urine: 1700 mL

## 2015-04-01 LAB — 24 HR URINE,KAPPA/LAMBDA LIGHT CHAINS
24H Urine Volume: 1700 mL/24 h
Measured Lambda Chain: <0.4 mg/dL (ref <2.00)

## 2015-04-01 NOTE — H&P (Signed)
  Anita Gonzalez HPI: The patient was identified to have an anemia on routine examination, but I do not have the blood work results. Currently the patient denies any issues with chest pain, SOB, or MI. There is no evidence of hematochezia, melena, hematruia, or hemoptysis. In the past she had a colonoscopy by a GI physician in [place] for her anemia. She also underwent an EGD and these procedures were performed 5 and 3 years ago, respectively. Per her report no abnormalities were identified. There is no known family history of colon cancer. Additionally, she complains of diarrhea 2-3 times per week and it is in the form of soft stools. She has been on metformin for a number of years, but she denies starting on any new medications.  Past Medical History  Diagnosis Date  . Diabetes mellitus without complication   . Asthma   . Arthritis   . Hypertension   . Rheumatoid arthritis 1996  . GERD (gastroesophageal reflux disease)   . Anemia     Past Surgical History  Procedure Laterality Date  . Cesarean section    . Hernia repair    . Hemorrhoid surgery    . Cesarean section    . Hernia repair    . Abdominal hysterectomy      Family History  Problem Relation Age of Onset  . Diabetes Mother   . Heart failure Mother   . Cancer Brother     throat cancer     Social History:  reports that she quit smoking about 4 years ago. She does not have any smokeless tobacco history on file. She reports that she drinks alcohol. She reports that she does not use illicit drugs.  Allergies:  Allergies  Allergen Reactions  . Fish Allergy Anaphylaxis  . Peanuts [Peanut Oil] Anaphylaxis, Hives and Itching  . Ferrous Sulfate Other (See Comments)    Turned stool dark.   . Iodinated Diagnostic Agents   . Penicillins Hives, Itching and Swelling  . Percocet [Oxycodone-Acetaminophen] Nausea Only and Other (See Comments)    Raised blood pressure, Sweats, nervous    Medications:  Scheduled:  Continuous: .  sodium chloride    . lactated ringers      No results found for this or any previous visit (from the past 24 hour(s)).   No results found.  ROS:  As stated above in the HPI otherwise negative.  There were no vitals taken for this visit.    PE: Gen: NAD, Alert and Oriented HEENT:  [place]/AT, EOMI Neck: Supple, no LAD Lungs: CTA Bilaterally CV: RRR without M/G/R ABM: Soft, NTND, +BS Ext: No C/C/E  Assessment/Plan: 1) IDA. 2) COPD with home oxygen.  Plan: 10 EGD/Colonoscopy.  Anita Gonzalez D 04/01/2015, 2:05 PM

## 2015-04-02 ENCOUNTER — Ambulatory Visit (HOSPITAL_COMMUNITY)
Admission: RE | Admit: 2015-04-02 | Discharge: 2015-04-02 | Disposition: A | Discharge status: Home / Self Care | Payer: Medicare HMO | Source: Ambulatory Visit | Attending: Gastroenterology | Admitting: Gastroenterology

## 2015-04-02 ENCOUNTER — Ambulatory Visit (HOSPITAL_COMMUNITY): Payer: Medicare HMO | Admitting: Anesthesiology

## 2015-04-02 ENCOUNTER — Encounter (HOSPITAL_COMMUNITY): Payer: Self-pay

## 2015-04-02 ENCOUNTER — Ambulatory Visit: Payer: Medicare HMO

## 2015-04-02 ENCOUNTER — Encounter (HOSPITAL_COMMUNITY)
Admission: RE | Disposition: A | Discharge status: Home / Self Care | Payer: Self-pay | Source: Ambulatory Visit | Attending: Gastroenterology

## 2015-04-02 DIAGNOSIS — K31819 Angiodysplasia of stomach and duodenum without bleeding: Secondary | ICD-10-CM | POA: Diagnosis not present

## 2015-04-02 DIAGNOSIS — Z91010 Allergy to peanuts: Secondary | ICD-10-CM | POA: Diagnosis not present

## 2015-04-02 DIAGNOSIS — D509 Iron deficiency anemia, unspecified: Secondary | ICD-10-CM | POA: Insufficient documentation

## 2015-04-02 DIAGNOSIS — Z90710 Acquired absence of both cervix and uterus: Secondary | ICD-10-CM | POA: Diagnosis not present

## 2015-04-02 DIAGNOSIS — J45909 Unspecified asthma, uncomplicated: Secondary | ICD-10-CM | POA: Insufficient documentation

## 2015-04-02 DIAGNOSIS — Z888 Allergy status to other drugs, medicaments and biological substances status: Secondary | ICD-10-CM | POA: Diagnosis not present

## 2015-04-02 DIAGNOSIS — Z6841 Body Mass Index (BMI) 40.0 and over, adult: Secondary | ICD-10-CM | POA: Insufficient documentation

## 2015-04-02 DIAGNOSIS — K219 Gastro-esophageal reflux disease without esophagitis: Secondary | ICD-10-CM | POA: Diagnosis not present

## 2015-04-02 DIAGNOSIS — Z880 Allergy status to penicillin: Secondary | ICD-10-CM | POA: Diagnosis not present

## 2015-04-02 DIAGNOSIS — M069 Rheumatoid arthritis, unspecified: Secondary | ICD-10-CM | POA: Diagnosis not present

## 2015-04-02 DIAGNOSIS — Z886 Allergy status to analgesic agent status: Secondary | ICD-10-CM | POA: Diagnosis not present

## 2015-04-02 DIAGNOSIS — Z91013 Allergy to seafood: Secondary | ICD-10-CM | POA: Insufficient documentation

## 2015-04-02 DIAGNOSIS — K5520 Angiodysplasia of colon without hemorrhage: Secondary | ICD-10-CM | POA: Diagnosis not present

## 2015-04-02 DIAGNOSIS — Z87440 Personal history of urinary (tract) infections: Secondary | ICD-10-CM | POA: Diagnosis not present

## 2015-04-02 DIAGNOSIS — E119 Type 2 diabetes mellitus without complications: Secondary | ICD-10-CM | POA: Diagnosis not present

## 2015-04-02 DIAGNOSIS — I10 Essential (primary) hypertension: Secondary | ICD-10-CM | POA: Insufficient documentation

## 2015-04-02 DIAGNOSIS — Z79899 Other long term (current) drug therapy: Secondary | ICD-10-CM | POA: Diagnosis not present

## 2015-04-02 HISTORY — PX: COLONOSCOPY WITH PROPOFOL: SHX5780

## 2015-04-02 HISTORY — PX: ESOPHAGOGASTRODUODENOSCOPY (EGD) WITH PROPOFOL: SHX5813

## 2015-04-02 HISTORY — DX: Gastro-esophageal reflux disease without esophagitis: K21.9

## 2015-04-02 HISTORY — DX: Anemia, unspecified: D64.9

## 2015-04-02 LAB — GLUCOSE, CAPILLARY: Glucose-Capillary: 114 mg/dL — ABNORMAL HIGH (ref 65–99)

## 2015-04-02 SURGERY — ESOPHAGOGASTRODUODENOSCOPY (EGD) WITH PROPOFOL
Anesthesia: Monitor Anesthesia Care

## 2015-04-02 MED ORDER — LIDOCAINE HCL (CARDIAC) 20 MG/ML IV SOLN
INTRAVENOUS | Status: DC | PRN
  Administered 2015-04-02: 50 mg via INTRAVENOUS

## 2015-04-02 MED ORDER — LIDOCAINE HCL (CARDIAC) 20 MG/ML IV SOLN
INTRAVENOUS | Status: AC
  Filled 2015-04-02: qty 5

## 2015-04-02 MED ORDER — PROPOFOL 10 MG/ML IV BOLUS
INTRAVENOUS | Status: AC
  Filled 2015-04-02: qty 20

## 2015-04-02 MED ORDER — PROPOFOL 10 MG/ML IV BOLUS
INTRAVENOUS | Status: DC | PRN
  Administered 2015-04-02: 30 mg via INTRAVENOUS
  Administered 2015-04-02 (×2): 20 mg via INTRAVENOUS

## 2015-04-02 MED ORDER — ESMOLOL HCL 10 MG/ML IV SOLN
INTRAVENOUS | Status: AC
  Filled 2015-04-02: qty 10

## 2015-04-02 MED ORDER — SODIUM CHLORIDE 0.9 % IV SOLN: INTRAVENOUS | Status: DC

## 2015-04-02 MED ORDER — PROPOFOL INFUSION 10 MG/ML OPTIME
INTRAVENOUS | Status: DC | PRN
  Administered 2015-04-02: 160 ug/kg/min via INTRAVENOUS

## 2015-04-02 MED ORDER — GLYCOPYRROLATE 0.2 MG/ML IJ SOLN
INTRAMUSCULAR | Status: AC
  Filled 2015-04-02: qty 1

## 2015-04-02 MED ORDER — LACTATED RINGERS IV SOLN
INTRAVENOUS | Status: DC
  Administered 2015-04-02: 1000 mL via INTRAVENOUS

## 2015-04-02 MED ORDER — ESMOLOL HCL 10 MG/ML IV SOLN
INTRAVENOUS | Status: DC | PRN
  Administered 2015-04-02: 30 mg via INTRAVENOUS
  Administered 2015-04-02: 20 mg via INTRAVENOUS

## 2015-04-02 SURGICAL SUPPLY — 24 items

## 2015-04-02 NOTE — Transfer of Care (Signed)
Immediate Anesthesia Transfer of Care Note  Patient: Anita Gonzalez  Procedure(s) Performed: Procedure(s): ESOPHAGOGASTRODUODENOSCOPY (EGD) WITH PROPOFOL (N/A) COLONOSCOPY WITH PROPOFOL (N/A)  Patient Location: PACU  Anesthesia Type:MAC  Level of Consciousness:  sedated, patient cooperative and responds to stimulation  Airway & Oxygen Therapy:Patient Spontanous Breathing and Patient connected to face mask oxgen  Post-op Assessment:  Report given to PACU RN and Post -op Vital signs reviewed and stable  Post vital signs:  Reviewed and stable  Last Vitals:  Filed Vitals:   04/02/15 0922  BP: 154/92  Pulse: 100  Temp: 37.1 C  Resp: 30    Complications: No apparent anesthesia complications

## 2015-04-02 NOTE — Anesthesia Postprocedure Evaluation (Signed)
  Anesthesia Post-op Note  Patient: Manufacturing engineer  Procedure(s) Performed: Procedure(s) (LRB): ESOPHAGOGASTRODUODENOSCOPY (EGD) WITH PROPOFOL (N/A) COLONOSCOPY WITH PROPOFOL (N/A)  Patient Location: PACU  Anesthesia Type: MAC  Level of Consciousness: awake and alert   Airway and Oxygen Therapy: Patient Spontanous Breathing  Post-op Pain: mild  Post-op Assessment: Post-op Vital signs reviewed, Patient's Cardiovascular Status Stable, Respiratory Function Stable, Patent Airway and No signs of Nausea or vomiting  Last Vitals:  Filed Vitals:   04/02/15 1120  BP:   Pulse: 98  Temp:   Resp: 19    Post-op Vital Signs: stable   Complications: No apparent anesthesia complications

## 2015-04-02 NOTE — Anesthesia Preprocedure Evaluation (Addendum)
Anesthesia Evaluation  Patient identified by MRN, date of birth, ID band Patient awake    Reviewed: Allergy & Precautions, H&P , NPO status , Patient's Chart, lab work & pertinent test results, reviewed documented beta blocker date and time   Airway Mallampati: II  TM Distance: >3 FB Neck ROM: full    Dental  (+) Edentulous Upper, Edentulous Lower, Dental Advisory Given Only a few teeth left:   Pulmonary asthma , former smoker,  breath sounds clear to auscultation  Pulmonary exam normal       Cardiovascular Exercise Tolerance: Good hypertension, Pt. on home beta blockers Normal cardiovascular examRhythm:regular Rate:Normal  Sinus tachycardia   Neuro/Psych negative neurological ROS  negative psych ROS   GI/Hepatic negative GI ROS, Neg liver ROS,   Endo/Other  diabetes, Well Controlled, Type 2, Oral Hypoglycemic AgentsMorbid obesity  Renal/GU negative Renal ROS  negative genitourinary   Musculoskeletal  (+) Arthritis -, Rheumatoid disorders,    Abdominal (+) + obese,   Peds  Hematology negative hematology ROS (+)   Anesthesia Other Findings   Reproductive/Obstetrics negative OB ROS                           Anesthesia Physical Anesthesia Plan  ASA: III  Anesthesia Plan: MAC   Post-op Pain Management:    Induction:   Airway Management Planned:   Additional Equipment:   Intra-op Plan:   Post-operative Plan:   Informed Consent: I have reviewed the patients History and Physical, chart, labs and discussed the procedure including the risks, benefits and alternatives for the proposed anesthesia with the patient or authorized representative who has indicated his/her understanding and acceptance.   Dental Advisory Given  Plan Discussed with: CRNA and Surgeon  Anesthesia Plan Comments:         Anesthesia Quick Evaluation

## 2015-04-02 NOTE — Discharge Instructions (Signed)
Colonoscopy, Care After °These instructions give you information on caring for yourself after your procedure. Your doctor may also give you more specific instructions. Call your doctor if you have any problems or questions after your procedure. °HOME CARE °· Do not drive for 24 hours. °· Do not sign important papers or use machinery for 24 hours. °· You may shower. °· You may go back to your usual activities, but go slower for the first 24 hours. °· Take rest breaks often during the first 24 hours. °· Walk around or use warm packs on your belly (abdomen) if you have belly cramping or gas. °· Drink enough fluids to keep your pee (urine) clear or pale yellow. °· Resume your normal diet. Avoid heavy or fried foods. °· Avoid drinking alcohol for 24 hours or as told by your doctor. °· Only take medicines as told by your doctor. °If a tissue sample (biopsy) was taken during the procedure:  °· Do not take aspirin or blood thinners for 7 days, or as told by your doctor. °· Do not drink alcohol for 7 days, or as told by your doctor. °· Eat soft foods for the first 24 hours. °GET HELP IF: °You still have a small amount of blood in your poop (stool) 2-3 days after the procedure. °GET HELP RIGHT AWAY IF: °· You have more than a small amount of blood in your poop. °· You see clumps of tissue (blood clots) in your poop. °· Your belly is puffy (swollen). °· You feel sick to your stomach (nauseous) or throw up (vomit). °· You have a fever. °· You have belly pain that gets worse and medicine does not help. °MAKE SURE YOU: °· Understand these instructions. °· Will watch your condition. °· Will get help right away if you are not doing well or get worse. °Document Released: 12/02/2010 Document Revised: 11/04/2013 Document Reviewed: 07/07/2013 °ExitCare® Patient Information ©2015 [employer]. This information is not intended to replace advice given to you by your health care provider. Make sure you discuss any questions you have with  your health care provider. °Esophagogastroduodenoscopy °Care After °Refer to this sheet in the next few weeks. These instructions provide you with information on caring for yourself after your procedure. Your caregiver may also give you more specific instructions. Your treatment has been planned according to current medical practices, but problems sometimes occur. Call your caregiver if you have any problems or questions after your procedure.  °HOME CARE INSTRUCTIONS °· Do not eat or drink anything until the numbing medicine (local anesthetic) has worn off and your gag reflex has returned. You will know that the local anesthetic has worn off when you can swallow comfortably. °· Do not drive for 12 hours after the procedure or as directed by your caregiver. °· Only take medicines as directed by your caregiver. °SEEK MEDICAL CARE IF:  °· You cannot stop coughing. °· You are not urinating at all or less than usual. °SEEK IMMEDIATE MEDICAL CARE IF: °· You have difficulty swallowing. °· You cannot eat or drink. °· You have worsening throat or chest pain. °· You have dizziness, lightheadedness, or you faint. °· You have nausea or vomiting. °· You have chills. °· You have a fever. °· You have severe abdominal pain. °· You have black, tarry, or bloody stools. °Document Released: 10/16/2012 Document Reviewed: 10/16/2012 °ExitCare® Patient Information ©2015 [employer]. This information is not intended to replace advice given to you by your health care provider. Make sure you   discuss any questions you have with your health care provider. ° °

## 2015-04-02 NOTE — Op Note (Signed)
[facility] [address] [place], [address]   COLONOSCOPY PROCEDURE REPORT  PATIENT: Anita Gonzalez, Anita Gonzalez  MR#: [id number] BIRTHDATE: 09/16/1946 , 69  yrs. old GENDER: female ENDOSCOPIST: Jeani Hawking, MD REFERRED BY: PROCEDURE DATE:  2015-04-22 PROCEDURE:   Colonoscopy with ablation ASA CLASS:   Class III INDICATIONS:iron deficiency anemia. MEDICATIONS: Monitored anesthesia care  DESCRIPTION OF PROCEDURE:   After the risks and benefits and of the procedure were explained, informed consent was obtained.  revealed no abnormalities of the rectum.    The Pentax Adult Colonscope [id number]  endoscope was introduced through the anus and advanced to the terminal ileum which was intubated for a short distance .  The quality of the prep was excellent. .  The instrument was then slowly withdrawn as the colon was fully examined. Estimated blood loss is zero unless otherwise noted in this procedure report.     FINDINGS: In the cecum a classic appearing AVM was identified an ablated with APC.  Another possible AVM was noted in the cecum and ablated with APC.  No evidence of inflammation, uclerations, erosions, polyps or masses in the rest of the colon. Retroflexed views revealed no abnormalities.     The scope was then withdrawn from the patient and the procedure completed.  WITHDRAWAL TIME: 12 minutes 0 seconds  COMPLICATIONS: There were no immediate complications. ENDOSCOPIC IMPRESSION: 1) 1-2 Cecal AVMs s/p APC.  RECOMMENDATIONS: 1) Follow HGB and transfuse as necessary.  REPEAT EXAM:  cc:  _______________________________ eSignedJeani Hawking, MD April 22, 2015 10:49 AM   CPT CODES: ICD CODES:  The ICD and CPT codes recommended by this software are interpretations from the data that the clinical staff has captured with the software.  The verification of the translation of this report to the ICD and CPT codes and modifiers is the sole responsibility of the  health care institution and practicing physician where this report was generated.  [employer] will not be held responsible for the validity of the ICD and CPT codes included on this report.  AMA assumes no liability for data contained or not contained herein. CPT is a Publishing rights manager of the [employer].   PATIENT NAME:  [name], [name] MR#: [id number]

## 2015-04-02 NOTE — Op Note (Signed)
[facility] [address] [place], [address]   ENDOSCOPY PROCEDURE REPORT  PATIENT: Anita Gonzalez, Anita Gonzalez  MR#: [id number] BIRTHDATE: Feb 06, 1946 , 69  yrs. old GENDER: female ENDOSCOPIST:Kaylei Frink Elnoria Howard, MD REFERRED BY: PROCEDURE DATE:  Apr 11, 2015 PROCEDURE:   EGD w/ ablation ASA CLASS:    Class III INDICATIONS: IDA MEDICATION: Monitored anesthesia care TOPICAL ANESTHETIC:   none  DESCRIPTION OF PROCEDURE:   After the risks and benefits of the procedure were explained, informed consent was obtained.  The Pentax Gastroscope [id number]  endoscope was introduced through the mouth  and advanced to the second portion of the duodenum .  The instrument was slowly withdrawn as the mucosa was fully examined. Estimated blood loss is zero unless otherwise noted in this procedure report.   FINDINGS: The Z-line was normal.  No evidence of esophagitis.  The gastric lumen was also normal.  In the second and third portions of the duodenum a total of 8-10 nonbleeding AVMs were identified.  All the AVMs were ablated with APC.    Retroflexed views revealed no abnormalities.    The scope was then withdrawn from the patient and the procedure completed.  COMPLICATIONS: There were no immediate complications.  ENDOSCOPIC IMPRESSION: 1) Duodenal AVMs s/p APC.  RECOMMENDATIONS: 1) Follow up in the office in one month. 2) Repeat CBC. _______________________________ eSignedJeani Hawking, MD 04/11/15 10:56 AM   cc:  CPT CODES: ICD CODES:  The ICD and CPT codes recommended by this software are interpretations from the data that the clinical staff has captured with the software.  The verification of the translation of this report to the ICD and CPT codes and modifiers is the sole responsibility of the health care institution and practicing physician where this report was generated.  [employer] will not be held responsible for the validity of the ICD and CPT codes  included on this report.  AMA assumes no liability for data contained or not contained herein. CPT is a Publishing rights manager of the [employer].

## 2015-04-05 ENCOUNTER — Ambulatory Visit (HOSPITAL_BASED_OUTPATIENT_CLINIC_OR_DEPARTMENT_OTHER): Payer: Medicare HMO

## 2015-04-05 ENCOUNTER — Other Ambulatory Visit: Payer: Self-pay | Admitting: Hematology

## 2015-04-05 ENCOUNTER — Encounter (HOSPITAL_COMMUNITY): Payer: Self-pay | Admitting: Gastroenterology

## 2015-04-05 VITALS — BP 115/86 | HR 71 | Temp 98.2°F | Resp 20

## 2015-04-05 DIAGNOSIS — D509 Iron deficiency anemia, unspecified: Secondary | ICD-10-CM | POA: Diagnosis not present

## 2015-04-05 MED ORDER — ALTEPLASE 2 MG IJ SOLR
2.0000 mg | Freq: Once | INTRAMUSCULAR | Status: DC | PRN
  Filled 2015-04-05: qty 2

## 2015-04-05 MED ORDER — SODIUM CHLORIDE 0.9 % IV SOLN
510.0000 mg | Freq: Once | INTRAVENOUS | Status: AC
  Administered 2015-04-05: 510 mg via INTRAVENOUS
  Filled 2015-04-05: qty 17

## 2015-04-05 MED ORDER — SODIUM CHLORIDE 0.9 % IJ SOLN
10.0000 mL | INTRAMUSCULAR | Status: DC | PRN
  Filled 2015-04-05: qty 10

## 2015-04-05 MED ORDER — HEPARIN SOD (PORK) LOCK FLUSH 100 UNIT/ML IV SOLN
250.0000 [IU] | Freq: Once | INTRAVENOUS | Status: DC | PRN
Start: 2015-04-05 — End: 2015-04-05
  Filled 2015-04-05: qty 5

## 2015-04-05 MED ORDER — SODIUM CHLORIDE 0.9 % IV SOLN
Freq: Once | INTRAVENOUS | Status: AC
  Administered 2015-04-05: 14:00:00 via INTRAVENOUS

## 2015-04-05 MED ORDER — SODIUM CHLORIDE 0.9 % IJ SOLN
3.0000 mL | Freq: Once | INTRAMUSCULAR | Status: DC | PRN
  Filled 2015-04-05: qty 10

## 2015-04-05 MED ORDER — HEPARIN SOD (PORK) LOCK FLUSH 100 UNIT/ML IV SOLN
500.0000 [IU] | Freq: Once | INTRAVENOUS | Status: DC | PRN
  Filled 2015-04-05: qty 5

## 2015-04-05 NOTE — Patient Instructions (Signed)

## 2015-04-06 ENCOUNTER — Encounter: Payer: Self-pay | Admitting: Internal Medicine

## 2015-04-06 ENCOUNTER — Ambulatory Visit (INDEPENDENT_AMBULATORY_CARE_PROVIDER_SITE_OTHER): Payer: Medicare HMO | Admitting: Internal Medicine

## 2015-04-06 VITALS — BP 120/62 | HR 74 | Ht 61.0 in | Wt 217.0 lb

## 2015-04-06 DIAGNOSIS — Z720 Tobacco use: Secondary | ICD-10-CM

## 2015-04-06 DIAGNOSIS — R0689 Other abnormalities of breathing: Secondary | ICD-10-CM

## 2015-04-06 DIAGNOSIS — R0902 Hypoxemia: Secondary | ICD-10-CM | POA: Insufficient documentation

## 2015-04-06 DIAGNOSIS — R0600 Dyspnea, unspecified: Secondary | ICD-10-CM

## 2015-04-06 DIAGNOSIS — Z87891 Personal history of nicotine dependence: Secondary | ICD-10-CM | POA: Insufficient documentation

## 2015-04-06 DIAGNOSIS — Z8739 Personal history of other diseases of the musculoskeletal system and connective tissue: Secondary | ICD-10-CM

## 2015-04-06 DIAGNOSIS — Z8709 Personal history of other diseases of the respiratory system: Secondary | ICD-10-CM | POA: Diagnosis not present

## 2015-04-06 HISTORY — DX: Other abnormalities of breathing: R06.89

## 2015-04-06 HISTORY — DX: Other abnormalities of breathing: R06.00

## 2015-04-06 NOTE — Patient Instructions (Addendum)
ICD-9-CM ICD-10-CM   1. Dyspnea and respiratory abnormality 786.09 R06.00     R06.89   2. Hypoxemia 799.02 R09.02   3. History of asthma V12.69 Z87.09   4. History of rheumatoid arthritis V13.4 Z87.39   5. History of smoking V15.82 Z72.0    Unclear why you are needing oxygen Do HRCT chest Do Full PFT Do ECHO for assessment of RV and LV function Continue o2 use till issues sorted out  Followup  - return to see my NP tammy after above next few weeks -exhaled Nitric Oxide testing at followup if possib;le - based on above might need sleep apnea testing

## 2015-04-06 NOTE — Progress Notes (Signed)
Subjective:    Patient ID: Anita Gonzalez, female    DOB: [birth year], 69 y.o.   MRN: [id number] PCP Benito Mccreedy, MD  HPI  IOV 04/06/2015  Chief Complaint  Patient presents with  . Pulmonary Consult    Pt referred by Raelyn Number, PA for O2 dependence.    69 year old female moved from [place] to [place] in 2016. Has a history of type 2 diabetes, hypertension, hyperlipidemia, asthma not otherwise specified, rheumatoid arthritis, former smoker and significant obesity  She started good historian. The history is minimal elevated from review of the outside records from 02/03/2015 and review of [facility] medical records from February 2016. And in talking to the patient.  At baseline she is living in [place], [place]. She carries a long-standing diagnosis of asthma for which she takes possibly Advair on a non-compliant basis and albuterol as needed. Because of this she has chronic cough and shortness of breath with exertion and relieved by rest. She moved into December 2015 to [place], [place] to be close to her daughter. Then around 01/06/2015 she developed acute worsening of cough with mucus production and was hospitalized under the tried [employer] for 5 days. Chest x-ray to admission documented below showed right lower lobe consolidation. She was treated as community acquired pneumonia Since then she's been placed on oxygen at discharge. Prior to that she was never on oxygen. She is unaware if she was having exertional hypoxemia prior to this admission. Currently she feels that the pneumonia has resolved and she is back to her baseline health. In the office today when she exerted she did desaturate to 60%. However she does not feel this.   In terms of her past medical history - Smoking: 18.5 prior smoking history  - RA: REports RA NOS - not able to give me details. Not on immune modulator. Not seen rheumatlogist in past - ASthma: as above. LAst admit 2 years ago  due to aeasthma - ILD - denies =-OSA: denies testing or diagnosis but appears to be high risk for it  Chest x-ray from 01/06/2015 personally visualized image shows right lower lobe consolidation. Agree with the formal interpretation  Outside lab tests from primary care physician reviewed dated 02/03/2015 ESR 34, ANA negative, rheumatoid factor normal, normal TSH creatinine 0.9 mg percent. Abnormal values include hemoglobin of 9 g percent [in the hospital hemoglobin fluctuated between 7.5 g percent and 8.4 g percent. Most recent hemoglobin 03/26/2015 shows improvement of 10.6 g percent.   has a past medical history of Diabetes mellitus without complication; Asthma; Arthritis; Hypertension; Rheumatoid arthritis (1996); GERD (gastroesophageal reflux disease); Anemia; and Hay fever.   reports that she quit smoking about 4 years ago. Her smoking use included Cigarettes. She has a 18.75 pack-year smoking history. She has never used smokeless tobacco.  Past Surgical History  Procedure Laterality Date  . Cesarean section    . Hernia repair    . Hemorrhoid surgery    . Cesarean section    . Hernia repair    . Abdominal hysterectomy    . Esophagogastroduodenoscopy (egd) with propofol N/A 04/02/2015    Procedure: ESOPHAGOGASTRODUODENOSCOPY (EGD) WITH PROPOFOL;  Surgeon: Carol Ada, MD;  Location: [facility];  Service: Endoscopy;  Laterality: N/A;  . Colonoscopy with propofol N/A 04/02/2015    Procedure: COLONOSCOPY WITH PROPOFOL;  Surgeon: Carol Ada, MD;  Location: [facility];  Service: Endoscopy;  Laterality: N/A;    Allergies  Allergen Reactions  . Fish Allergy Anaphylaxis  . Peanuts [Peanut  Oil] Anaphylaxis, Hives and Itching  . Ferrous Sulfate Other (See Comments)    Turned stool dark.   . Iodinated Diagnostic Agents   . Penicillins Hives, Itching and Swelling  . Percocet [Oxycodone-Acetaminophen] Nausea Only and Other (See Comments)    Raised blood pressure, Sweats, nervous      There is no immunization history on file for this patient.  Family History  Problem Relation Age of Onset  . Diabetes Mother   . Heart failure Mother   . Cancer Brother     throat cancer      Current outpatient prescriptions:  .  albuterol (PROVENTIL HFA;VENTOLIN HFA) 108 (90 BASE) MCG/ACT inhaler, Inhale 2 puffs into the lungs every 6 (six) hours as needed for wheezing or shortness of breath (wheezing). , Disp: , Rfl:  .  bumetanide (BUMEX) 2 MG tablet, Take 2 mg by mouth 2 (two) times a week. Monday and Thursday., Disp: , Rfl:  .  clobetasol (TEMOVATE) 0.05 % GEL, Apply 1 application topically 2 (two) times daily., Disp: 30 each, Rfl: 0 .  cyanocobalamin 1000 MCG tablet, Take 100 mcg by mouth once a week. Monday, Disp: , Rfl:  .  cyclobenzaprine (FLEXERIL) 10 MG tablet, Take 10 mg by mouth every morning. , Disp: , Rfl:  .  diclofenac sodium (VOLTAREN) 1 % GEL, Apply 2 g topically 2 (two) times daily., Disp: , Rfl:  .  ergocalciferol (VITAMIN D2) 50000 UNITS capsule, Take 50,000 Units by mouth once a week. Thursday., Disp: , Rfl:  .  fluticasone (FLONASE) 50 MCG/ACT nasal spray, Place 1 spray into both nostrils daily as needed for allergies or rhinitis., Disp: , Rfl:  .  Fluticasone-Salmeterol (ADVAIR) 500-50 MCG/DOSE AEPB, Inhale 2 puffs into the lungs daily. (Patient taking differently: Inhale 1 puff into the lungs 2 (two) times daily. ), Disp: 3 each, Rfl: 0 .  folic acid (FOLVITE) 1 MG tablet, Take 1 mg by mouth daily., Disp: , Rfl:  .  loperamide (IMODIUM A-D) 2 MG tablet, Take 2 mg by mouth 4 (four) times daily as needed for diarrhea or loose stools., Disp: , Rfl:  .  metFORMIN (GLUCOPHAGE) 1000 MG tablet, Take 500 mg by mouth 2 (two) times daily., Disp: , Rfl:  .  metoprolol tartrate (LOPRESSOR) 25 MG tablet, Take 1 tablet (25 mg total) by mouth 2 (two) times daily. (Patient taking differently: Take 25 mg by mouth at bedtime. ), Disp: 60 tablet, Rfl: 0 .  montelukast  (SINGULAIR) 10 MG tablet, Take 10 mg by mouth at bedtime., Disp: , Rfl:  .  Multiple Vitamin (MULTIVITAMIN) tablet, Take 1 tablet by mouth daily. With iron., Disp: , Rfl:  .  omeprazole (PRILOSEC) 20 MG capsule, Take 20 mg by mouth daily., Disp: , Rfl:  .  potassium chloride (K-DUR,KLOR-CON) 10 MEQ tablet, Take 10 mEq by mouth every other day. , Disp: , Rfl:  .  simvastatin (ZOCOR) 20 MG tablet, Take 20 mg by mouth every morning. , Disp: , Rfl:  .  verapamil (VERELAN PM) 360 MG 24 hr capsule, Take 1 capsule (360 mg total) by mouth daily., Disp: 30 capsule, Rfl: 0 .  colchicine 0.6 MG tablet, Take 0.6 mg by mouth daily. , Disp: , Rfl:     Review of Systems  Constitutional: Negative for fever and unexpected weight change.  HENT: Positive for congestion and trouble swallowing. Negative for dental problem, ear pain, nosebleeds, postnasal drip, rhinorrhea, sinus pressure, sneezing and sore throat.   Eyes: Negative  for redness and itching.  Respiratory: Positive for cough and shortness of breath. Negative for chest tightness and wheezing.   Cardiovascular: Positive for leg swelling. Negative for palpitations.  Gastrointestinal: Negative for nausea and vomiting.  Genitourinary: Negative for dysuria.  Musculoskeletal: Negative for joint swelling.  Skin: Negative for rash.  Neurological: Positive for headaches.  Hematological: Does not bruise/bleed easily.  Psychiatric/Behavioral: Negative for dysphoric mood. The patient is not nervous/anxious.        Objective:   Physical Exam  Constitutional: She is oriented to person, place, and time. She appears well-developed and well-nourished. No distress.  Body mass index is 41.02 kg/(m^2).   HENT:  Head: Normocephalic and atraumatic.  Right Ear: External ear normal.  Left Ear: External ear normal.  Mouth/Throat: Oropharynx is clear and moist. No oropharyngeal exudate.  Mallampati class III  Eyes: Conjunctivae and EOM are normal. Pupils are equal,  round, and reactive to light. Right eye exhibits no discharge. Left eye exhibits no discharge. No scleral icterus.  Neck: Normal range of motion. Neck supple. No JVD present. No tracheal deviation present. No thyromegaly present.  Cardiovascular: Normal rate, regular rhythm, normal heart sounds and intact distal pulses.  Exam reveals no gallop and no friction rub.   No murmur heard. Pulmonary/Chest: Effort normal and breath sounds normal. No respiratory distress. She has no wheezes. She has no rales. She exhibits no tenderness.  Abdominal: Soft. Bowel sounds are normal. She exhibits no distension and no mass. There is no tenderness. There is no rebound and no guarding.  Musculoskeletal: Normal range of motion. She exhibits no edema or tenderness.  Uses a cane  Lymphadenopathy:    She has no cervical adenopathy.  Neurological: She is alert and oriented to person, place, and time. She has normal reflexes. No cranial nerve deficit. She exhibits normal muscle tone. Coordination normal.  Skin: Skin is warm and dry. No rash noted. She is not diaphoretic. No erythema. No pallor.  Psychiatric: She has a normal mood and affect. Her behavior is normal. Judgment and thought content normal.  Vitals reviewed.   Filed Vitals:   04/06/15 1206  BP: 120/62  Pulse: 74  Height: _0  (1.549 m)  Weight: 217 lb (98.431 kg)  SpO2: 94%         Assessment & Plan:     ICD-9-CM ICD-10-CM   1. Dyspnea and respiratory abnormality 786.09 R06.00 Pulmonary function test    R06.89 CT Chest High Resolution     Echocardiogram     Ambulatory Referral for DME  2. Hypoxemia 799.02 R09.02 Pulmonary function test     CT Chest High Resolution     Echocardiogram     Ambulatory Referral for DME  3. History of asthma V12.69 Z87.09 Pulmonary function test     CT Chest High Resolution     Echocardiogram     Ambulatory Referral for DME  4. History of rheumatoid arthritis V13.4 Z87.39 Pulmonary function test     CT  Chest High Resolution     Echocardiogram  5. History of smoking V15.82 Z72.0 Pulmonary function test     CT Chest High Resolution     Echocardiogram  '0.....   Unclear why you are needing oxygen Do HRCT chest - rule out ILD Do Full PFT - eval obstrn v restriction Do ECHO for assessment of RV and LV function - rule out pulm htn or diast/syst chf or valve issues Continue o2 use till issues sorted out  Followup  -  return to see my NP tammy after above next few weeks -exhaled Nitric Oxide testing at followup if possib;le - based on above might need sleep apnea testing   Dr. Brand Males, M.D., [facility].C.P Pulmonary and Critical Care Medicine Staff Physician [place] [facility] Pager: [phone], If no answer or between  15:00h - 7:00h: call [phone]  04/23/2015 4:24 PM

## 2015-04-08 ENCOUNTER — Ambulatory Visit: Payer: Medicare HMO | Admitting: Physician Assistant

## 2015-04-09 ENCOUNTER — Telehealth: Payer: Self-pay

## 2015-04-09 NOTE — Telephone Encounter (Signed)
I called to change facility and date. It was incorrect on original AUTH done by another office .CORRECTION WAS MADE. VFIE#[id number] [facility] CHANGED TO FACILITY [facility] [address] & DATE OF ECHO 04/13/2015

## 2015-04-13 ENCOUNTER — Other Ambulatory Visit: Payer: Self-pay

## 2015-04-13 ENCOUNTER — Ambulatory Visit (HOSPITAL_COMMUNITY): Payer: Medicare HMO | Attending: Cardiovascular Disease

## 2015-04-13 ENCOUNTER — Ambulatory Visit (INDEPENDENT_AMBULATORY_CARE_PROVIDER_SITE_OTHER)
Admission: RE | Admit: 2015-04-13 | Discharge: 2015-04-13 | Disposition: A | Discharge status: Home / Self Care | Payer: Medicare HMO | Source: Ambulatory Visit | Attending: Internal Medicine | Admitting: Internal Medicine

## 2015-04-13 DIAGNOSIS — R0600 Dyspnea, unspecified: Secondary | ICD-10-CM

## 2015-04-13 DIAGNOSIS — Z8709 Personal history of other diseases of the respiratory system: Secondary | ICD-10-CM

## 2015-04-13 DIAGNOSIS — R0902 Hypoxemia: Secondary | ICD-10-CM

## 2015-04-13 DIAGNOSIS — I517 Cardiomegaly: Secondary | ICD-10-CM | POA: Diagnosis not present

## 2015-04-13 DIAGNOSIS — Z87891 Personal history of nicotine dependence: Secondary | ICD-10-CM

## 2015-04-13 DIAGNOSIS — Z8739 Personal history of other diseases of the musculoskeletal system and connective tissue: Secondary | ICD-10-CM

## 2015-04-13 DIAGNOSIS — Z720 Tobacco use: Secondary | ICD-10-CM

## 2015-04-13 DIAGNOSIS — R0689 Other abnormalities of breathing: Secondary | ICD-10-CM

## 2015-04-16 ENCOUNTER — Ambulatory Visit (HOSPITAL_COMMUNITY)
Admission: RE | Admit: 2015-04-16 | Discharge: 2015-04-16 | Disposition: A | Discharge status: Home / Self Care | Payer: Medicare HMO | Source: Ambulatory Visit | Attending: Internal Medicine | Admitting: Internal Medicine

## 2015-04-16 DIAGNOSIS — R0902 Hypoxemia: Secondary | ICD-10-CM

## 2015-04-16 DIAGNOSIS — Z87891 Personal history of nicotine dependence: Secondary | ICD-10-CM | POA: Diagnosis not present

## 2015-04-16 DIAGNOSIS — R0609 Other forms of dyspnea: Secondary | ICD-10-CM | POA: Diagnosis present

## 2015-04-16 DIAGNOSIS — R0689 Other abnormalities of breathing: Secondary | ICD-10-CM

## 2015-04-16 DIAGNOSIS — J45909 Unspecified asthma, uncomplicated: Secondary | ICD-10-CM | POA: Insufficient documentation

## 2015-04-16 DIAGNOSIS — Z8709 Personal history of other diseases of the respiratory system: Secondary | ICD-10-CM

## 2015-04-16 DIAGNOSIS — Z8739 Personal history of other diseases of the musculoskeletal system and connective tissue: Secondary | ICD-10-CM

## 2015-04-16 DIAGNOSIS — R0600 Dyspnea, unspecified: Secondary | ICD-10-CM

## 2015-04-16 LAB — PULMONARY FUNCTION TEST
DL/VA % pred: 85 %
DL/VA: 3.76 ml/min/mmHg/L
DLCO COR % PRED: 39 %
DLCO UNC % PRED: 35 %
DLCO cor: 7.91 ml/min/mmHg
DLCO unc: 7.13 ml/min/mmHg
FEF 25-75 Post: 3.02 L/sec
FEF 25-75 Pre: 1.49 L/sec
FEF2575-%Change-Post: 102 %
FEF2575-%PRED-PRE: 93 %
FEF2575-%Pred-Post: 189 %
FEV1-%CHANGE-POST: 15 %
FEV1-%Pred-Post: 72 %
FEV1-%Pred-Pre: 62 %
FEV1-Post: 1.2 L
FEV1-Pre: 1.04 L
FEV1FVC-%Change-Post: 0 %
FEV1FVC-%Pred-Pre: 112 %
FEV6-%Change-Post: 15 %
FEV6-%PRED-POST: 67 %
FEV6-%Pred-Pre: 58 %
FEV6-POST: 1.36 L
FEV6-PRE: 1.18 L
FEV6FVC-%Change-Post: 0 %
FEV6FVC-%Pred-Post: 103 %
FEV6FVC-%Pred-Pre: 104 %
FVC-%CHANGE-POST: 15 %
FVC-%Pred-Post: 64 %
FVC-%Pred-Pre: 55 %
FVC-POST: 1.37 L
FVC-Pre: 1.18 L
POST FEV1/FVC RATIO: 88 %
PRE FEV1/FVC RATIO: 88 %
PRE FEV6/FVC RATIO: 100 %
Post FEV6/FVC ratio: 100 %
RV % pred: 71 %
RV: 1.42 L
TLC % pred: 59 %
TLC: 2.72 L

## 2015-04-16 MED ORDER — ALBUTEROL SULFATE (2.5 MG/3ML) 0.083% IN NEBU
2.5000 mg | INHALATION_SOLUTION | Freq: Once | RESPIRATORY_TRACT | Status: AC
  Administered 2015-04-16: 2.5 mg via RESPIRATORY_TRACT

## 2015-04-22 ENCOUNTER — Telehealth: Payer: Self-pay | Admitting: Internal Medicine

## 2015-04-22 NOTE — Telephone Encounter (Signed)
Anita Gonzalez  You ar seeing her 6/10/6  Thanks  Dr. Kalman Shan, M.D., [facility].C.P Pulmonary and Critical Care Medicine Staff Physician [facility] Pager: [phone], If no answer or between  15:00h - 7:00h: call [phone]  04/22/2015 1:59 PM     PFT shows restriction with low dlco but CT has no ILD. Only mild bronchiectasis and co art calcification  ECHO - no pulm htn. Only diast dysfn  Overall  - a bit puzzling what is going on  REC  - consider ONO v sleep referral = VQ scan for chronic PE  -Cards referral for co art calcification and diast dysfn -

## 2015-04-23 ENCOUNTER — Ambulatory Visit (HOSPITAL_COMMUNITY): Payer: Medicare HMO | Attending: Adult Health

## 2015-04-23 ENCOUNTER — Encounter: Payer: Self-pay | Admitting: Adult Health

## 2015-04-23 ENCOUNTER — Ambulatory Visit (INDEPENDENT_AMBULATORY_CARE_PROVIDER_SITE_OTHER): Payer: Medicare HMO | Admitting: Adult Health

## 2015-04-23 VITALS — BP 122/60 | HR 81 | Temp 98.0°F | Ht 61.0 in | Wt 218.0 lb

## 2015-04-23 DIAGNOSIS — R0600 Dyspnea, unspecified: Secondary | ICD-10-CM | POA: Diagnosis not present

## 2015-04-23 DIAGNOSIS — J45901 Unspecified asthma with (acute) exacerbation: Secondary | ICD-10-CM | POA: Diagnosis not present

## 2015-04-23 DIAGNOSIS — R0902 Hypoxemia: Secondary | ICD-10-CM | POA: Diagnosis not present

## 2015-04-23 DIAGNOSIS — J9601 Acute respiratory failure with hypoxia: Secondary | ICD-10-CM | POA: Diagnosis not present

## 2015-04-23 DIAGNOSIS — R0689 Other abnormalities of breathing: Secondary | ICD-10-CM

## 2015-04-23 NOTE — Assessment & Plan Note (Addendum)
Dyspnea, questionable etiology Patient has persistent hypoxemia with unknown cause CT chest was negative for interstitial lung disease. This shows a mild bronchiectasis. PFT showed a restrictive process with no airflow obstruction may be due to morbid obesity. However, her diffusing capacity was low. She does have positive broncho-dilator response. For now, we'll set patient up for a VQ scan Continue Advair. Set up for split-night sleep study. Refer to cardiology for evaluation of atherosclerosis on CT chest. Along with risk factors

## 2015-04-23 NOTE — Assessment & Plan Note (Signed)
Check VQ scan Continue oxygen 2 L with activity and at bedtime. Check sleep study

## 2015-04-23 NOTE — Patient Instructions (Signed)
We are going to set you up for a Sleep study  We are going to set you up for a VQ scan .  Refer to cardiology for evaluation , abnormal CT scan and risk factors.  Continue Oxygen 2l/m with activity and At bedtime   Follow up Dr. Marchelle Gearing in 6 weeks and As needed

## 2015-04-23 NOTE — Progress Notes (Signed)
Subjective:    Patient ID: Anita Gonzalez, female    DOB: 08-19-1946, 69 y.o.   MRN: [id number] PCP Benito Mccreedy, MD  HPI  IOV 04/06/2015  Chief Complaint  Patient presents with  . Pulmonary Consult    Pt referred by Raelyn Number, PA for O2 dependence.    69 year old female moved from [place] to [place] in 2016. Has a history of type 2 diabetes, hypertension, hyperlipidemia, asthma not otherwise specified, rheumatoid arthritis, former smoker and significant obesity  She started good historian. The history is minimal elevated from review of the outside records from 02/03/2015 and review of [facility] medical records from February 2016. And in talking to the patient.  At baseline she is living in [place], [place]. She carries a long-standing diagnosis of asthma for which she takes possibly Advair on a non-compliant basis and albuterol as needed. Because of this she has chronic cough and shortness of breath with exertion and relieved by rest. She moved into December 2015 to [place], [place] to be close to her daughter. Then around 01/06/2015 she developed acute worsening of cough with mucus production and was hospitalized under the tried [employer] for 5 days. Chest x-ray to admission documented below showed right lower lobe consolidation. She was treated as community acquired pneumonia Since then she's been placed on oxygen at discharge. Prior to that she was never on oxygen. She is unaware if she was having exertional hypoxemia prior to this admission. Currently she feels that the pneumonia has resolved and she is back to her baseline health. In the office today when she exerted she did desaturate to 60%. However she does not feel this.   In terms of her past medical history - Smoking: 18.5 prior smoking history  - RA: REports RA NOS - not able to give me details. Not on immune modulator. Not seen rheumatlogist in [place] - ASthma: as above. LAst admit 2 years ago  due to aeasthma - ILD - denies =-OSA: denies testing or diagnosis but appears to be high risk for it  Chest x-ray from 01/06/2015 personally visualized image shows right lower lobe consolidation. Agree with the formal interpretation  Outside lab tests from primary care physician reviewed dated 02/03/2015 ESR 34, ANA negative, rheumatoid factor normal, normal TSH creatinine 0.9 mg percent. Abnormal values include hemoglobin of 9 g percent [in the hospital hemoglobin fluctuated between 7.5 g percent and 8.4 g percent. Most recent hemoglobin 03/26/2015 shows improvement of 10.6 g percent.   has a past medical history of Diabetes mellitus without complication; Asthma; Arthritis; Hypertension; Rheumatoid arthritis (1996); GERD (gastroesophageal reflux disease); Anemia; and Hay fever.   reports that she quit smoking about 4 years ago. Her smoking use included Cigarettes. She has a 18.75 pack-year smoking history. She has never used smokeless tobacco.   04/23/2015 Follow up  Patient returns for two-week follow-up She was seen for new pulmonary consult for dyspnea and hypoxia. Patient was admitted in February for pneumonia and treated with anabiotic's. She was placed on oxygen at discharge due to persistent desaturations. She is a former smoker. Patient was set up for a CT chest that showed mild diffuse bronchiectasis, very mild groundglass attenuation. Atherosclerosis. No evidence of interstitial lung disease. PFT shows restriction with a low diffusing capacity. FEV1 was 1.20 L, 72%, ratio 88, positive broncho-dilator response. Diffusing capacity 35% (PFTdid show patient had difficulty with DLCO component) 2-D echo showed an EF of 60-65%,, grade 1 diastolic dysfunction. Patient says she was seen in [place]  by cardiologist but has not establish here in [place] . Patient is currently on oxygen 2 L with activity and at bedtime. Has been told that she snores and does have some daytime  sleepiness. Last visit. Patient had desaturations with ambulation.      Review of Systems  Constitutional:   No  weight loss, night sweats,  Fevers, chills, fatigue, or  lassitude.  HEENT:   No headaches,  Difficulty swallowing,  Tooth/dental problems, or  Sore throat,                No sneezing, itching, ear ache, nasal congestion, post nasal drip,   CV:  No chest pain,  Orthopnea, PND, swelling in lower extremities, anasarca, dizziness, palpitations, syncope.   GI  No heartburn, indigestion, abdominal pain, nausea, vomiting, diarrhea, change in bowel habits, loss of appetite, bloody stools.   Resp:   No chest wall deformity  Skin: no rash or lesions.  GU: no dysuria, change in color of urine, no urgency or frequency.  No flank pain, no hematuria   MS:  No joint pain or swelling.  No decreased range of motion.  No back pain.  Psych:  No change in mood or affect. No depression or anxiety.  No memory loss.             Objective:   Physical Exam     HENT:  Head: Normocephalic and atraumatic.  Right Ear: External ear normal.  Left Ear: External ear normal.  Mouth/Throat: Oropharynx is clear and moist. No oropharyngeal exudate.  Mallampati class III  Eyes: Conjunctivae and EOM are normal. Pupils are equal, round, and reactive to light. Right eye exhibits no discharge. Left eye exhibits no discharge. No scleral icterus.  Neck: Normal range of motion. Neck supple. No JVD present. No tracheal deviation present. No thyromegaly present.  Cardiovascular: Normal rate, regular rhythm, normal heart sounds and intact distal pulses.  Exam reveals no gallop and no friction rub.   No murmur heard. Pulmonary/Chest: Effort normal and breath sounds normal. No respiratory distress. She has no wheezes. She has no rales. She exhibits no tenderness.  Abdominal: Soft. Bowel sounds are normal. She exhibits no distension and no mass. There is no tenderness. There is no rebound and no guarding.   Musculoskeletal: Normal range of motion. She exhibits no edema or tenderness.  Uses a cane  Lymphadenopathy:    She has no cervical adenopathy.  Neurological: She is alert and oriented to person, place, and time. She has normal reflexes. No cranial nerve deficit. She exhibits normal muscle tone. Coordination normal.  Skin: Skin is warm and dry. No rash noted. She is not diaphoretic. No erythema. No pallor.  Psychiatric: She has a normal mood and affect. Her behavior is normal. Judgment and thought content normal.  Vitals reviewed.     Assessment & Plan:

## 2015-04-24 ENCOUNTER — Telehealth: Payer: Self-pay | Admitting: Internal Medicine

## 2015-04-24 NOTE — Telephone Encounter (Signed)
Hi traci  You are seeing this patient in July 2016 for unexp;ained hypoxemia. My NP indicated there might be co. Art calcification.   Patient is going to have a VQ also  In your workup if there is a cath possibilty, please consider r right heart cath  Thanks  Dr. Kalman Shan, M.D., [facility].C.P Pulmonary and Critical Care Medicine Staff Physician [facility]monary and Critical Care Pager: [phone], If no answer or between  15:00h - 7:00h: call [phone]  04/24/2015 10:49 PM

## 2015-04-26 NOTE — Telephone Encounter (Signed)
Will do. Thanks for the heads up.

## 2015-04-30 ENCOUNTER — Ambulatory Visit (HOSPITAL_COMMUNITY)
Admission: RE | Admit: 2015-04-30 | Discharge: 2015-04-30 | Disposition: A | Discharge status: Home / Self Care | Payer: Medicare HMO | Source: Ambulatory Visit | Attending: Adult Health | Admitting: Adult Health

## 2015-04-30 ENCOUNTER — Encounter (HOSPITAL_COMMUNITY)
Admission: RE | Admit: 2015-04-30 | Discharge: 2015-04-30 | Disposition: A | Discharge status: Home / Self Care | Payer: Medicare HMO | Source: Ambulatory Visit | Attending: Adult Health | Admitting: Adult Health

## 2015-04-30 DIAGNOSIS — R0689 Other abnormalities of breathing: Secondary | ICD-10-CM | POA: Insufficient documentation

## 2015-04-30 DIAGNOSIS — J9601 Acute respiratory failure with hypoxia: Secondary | ICD-10-CM

## 2015-04-30 DIAGNOSIS — R0602 Shortness of breath: Secondary | ICD-10-CM | POA: Insufficient documentation

## 2015-04-30 DIAGNOSIS — R0902 Hypoxemia: Secondary | ICD-10-CM

## 2015-04-30 MED ORDER — TECHNETIUM TO 99M ALBUMIN AGGREGATED
6.0000 | Freq: Once | INTRAVENOUS | Status: AC | PRN
  Administered 2015-04-30: 6 via INTRAVENOUS

## 2015-04-30 MED ORDER — TECHNETIUM TC 99M DIETHYLENETRIAME-PENTAACETIC ACID: 43.6000 | Freq: Once | INTRAVENOUS | Status: AC | PRN

## 2015-05-05 ENCOUNTER — Encounter: Payer: Self-pay | Admitting: *Deleted

## 2015-05-07 ENCOUNTER — Encounter: Payer: Self-pay | Admitting: Internal Medicine

## 2015-05-07 ENCOUNTER — Ambulatory Visit (INDEPENDENT_AMBULATORY_CARE_PROVIDER_SITE_OTHER): Payer: Medicare HMO | Admitting: Internal Medicine

## 2015-05-07 ENCOUNTER — Telehealth: Payer: Self-pay | Admitting: Internal Medicine

## 2015-05-07 VITALS — BP 114/72 | HR 70 | Ht 61.0 in | Wt 220.0 lb

## 2015-05-07 DIAGNOSIS — R0989 Other specified symptoms and signs involving the circulatory and respiratory systems: Secondary | ICD-10-CM | POA: Diagnosis not present

## 2015-05-07 DIAGNOSIS — E785 Hyperlipidemia, unspecified: Secondary | ICD-10-CM | POA: Diagnosis not present

## 2015-05-07 LAB — LIPID PANEL
CHOLESTEROL: 131 mg/dL (ref 0–200)
HDL: 41.3 mg/dL (ref >39.00)
LDL CALC: 74 mg/dL (ref 0–99)
NonHDL: 89.7
TRIGLYCERIDES: 79 mg/dL (ref 0.0–149.0)
Total CHOL/HDL Ratio: 3
VLDL: 15.8 mg/dL (ref 0.0–40.0)

## 2015-05-07 NOTE — Progress Notes (Signed)
Cardiology Office Note   Date:  05/07/2015   ID:  [name], DOB 1946/05/31, MRN [id number]  PCP:  [name], MD  Cardiologist:   Dietrich Pates, MD   Chief Complaint  Patient presents with  . New Evaluation    abnormal CT      History of Present Illness:  Anita Gonzalez is a 69 y.o. female with a history of DM, HTN, HL, asthma, RA  Obesity  Former smoke (40 year x 1/2 pack)  Lived in [place] in [place] in Dec 2015. Seen in pulmonary  CT showed 3 v CAD .   On oxygen at home   No CP Gets SOB with activity sometimes  Better since been here in [place]    Pt says she had a strss test in [place] about 3 years ago   She says her beathing is better since then    Current Outpatient Prescriptions  Medication Sig Dispense Refill  . albuterol (PROVENTIL HFA;VENTOLIN HFA) 108 (90 BASE) MCG/ACT inhaler Inhale 2 puffs into the lungs every 6 (six) hours as needed for wheezing or shortness of breath (wheezing).     . bumetanide (BUMEX) 2 MG tablet Take 2 mg by mouth 2 (two) times a week. Monday and Thursday.    . clobetasol (TEMOVATE) 0.05 % GEL Apply 1 application topically 2 (two) times daily. 30 each 0  . colchicine 0.6 MG tablet Take 0.6 mg by mouth daily.     . cyanocobalamin 1000 MCG tablet Take 100 mcg by mouth once a week. Monday    . cyclobenzaprine (FLEXERIL) 10 MG tablet Take 10 mg by mouth every morning.     . diclofenac sodium (VOLTAREN) 1 % GEL Apply 2 g topically 2 (two) times daily.    . ergocalciferol (VITAMIN D2) 50000 UNITS capsule Take 50,000 Units by mouth once a week. Thursday.    . fluticasone (FLONASE) 50 MCG/ACT nasal spray Place 1 spray into both nostrils daily as needed for allergies or rhinitis.    . Fluticasone-Salmeterol (ADVAIR) 500-50 MCG/DOSE AEPB Inhale 2 puffs into the lungs daily. (Patient taking differently: Inhale 1 puff into the lungs 2 (two) times daily. ) 3 each 0  . folic acid (FOLVITE) 1 MG tablet Take 1 mg by mouth daily.    Marland Kitchen loperamide  (IMODIUM A-D) 2 MG tablet Take 2 mg by mouth 4 (four) times daily as needed for diarrhea or loose stools.    . metFORMIN (GLUCOPHAGE) 500 MG tablet Take 500 mg by mouth 2 (two) times daily.    . metoprolol tartrate (LOPRESSOR) 25 MG tablet Take 1 tablet (25 mg total) by mouth 2 (two) times daily. (Patient taking differently: Take 25 mg by mouth at bedtime. ) 60 tablet 0  . montelukast (SINGULAIR) 10 MG tablet Take 10 mg by mouth at bedtime.    . Multiple Vitamin (MULTIVITAMIN) tablet Take 1 tablet by mouth daily. With iron.    Marland Kitchen omeprazole (PRILOSEC) 20 MG capsule Take 20 mg by mouth daily.    . potassium chloride (K-DUR,KLOR-CON) 10 MEQ tablet Take 10 mEq by mouth every other day.     . simvastatin (ZOCOR) 20 MG tablet Take 20 mg by mouth every evening.     . verapamil (VERELAN PM) 360 MG 24 hr capsule Take 1 capsule (360 mg total) by mouth daily. 30 capsule 0   No current facility-administered medications for this visit.    Allergies:   Fish allergy; Peanuts; Ferrous sulfate; Hydrocodone; Iodinated diagnostic  agents; Oxycodone; Penicillins; and Percocet   Past Medical History  Diagnosis Date  . Diabetes mellitus without complication   . Asthma   . Arthritis   . Hypertension   . Rheumatoid arthritis 1996  . GERD (gastroesophageal reflux disease)   . Anemia   . Hay fever     Past Surgical History  Procedure Laterality Date  . Cesarean section    . Hernia repair    . Hemorrhoid surgery    . Cesarean section    . Hernia repair    . Abdominal hysterectomy    . Esophagogastroduodenoscopy (egd) with propofol N/A 04/02/2015    Procedure: ESOPHAGOGASTRODUODENOSCOPY (EGD) WITH PROPOFOL;  Surgeon: Jeani Hawking, MD;  Location: [facility];  Service: Endoscopy;  Laterality: N/A;  . Colonoscopy with propofol N/A 04/02/2015    Procedure: COLONOSCOPY WITH PROPOFOL;  Surgeon: Jeani Hawking, MD;  Location: [facility];  Service: Endoscopy;  Laterality: N/A;     Social History:  The patient   reports that she quit smoking about 4 years ago. Her smoking use included Cigarettes. She has a 18.75 pack-year smoking history. She has never used smokeless tobacco. She reports that she drinks about 2.4 oz of alcohol per week. She reports that she does not use illicit drugs.   Family History:  The patient's family history includes Diabetes in her mother; Heart failure in her mother; Throat cancer in her brother.    ROS:  Please see the history of present illness. All other systems are reviewed and  Negative to the above problem except as noted.    PHYSICAL EXAM: VS:  BP 114/72 mmHg  Pulse 70  Ht 5\' 1"  (1.549 m)  Wt 220 lb (99.791 kg)  BMI 41.59 kg/m2  SpO2 96%  GEN: <prbodly obese 69 yo in no acute distress HEENT: normal Neck: no JVD, ? Right carotid bruit, or masses Cardiac: RRR; no murmurs, rubs, or gallops,no edema  Respiratory:  clear to ausculation  SOme decreased airflow GI: soft, nontender, nondistended, + BS  No hepatomegaly  MS: no deformity Moving all extremities   Skin: warm and dry, no rash Neuro:  Strength and sensation are intact Psych: euthymic mood, full affect   EKG:  EKG is ordered today.   Lipid Panel No results found for: CHOL, TRIG, HDL, CHOLHDL, VLDL, LDLCALC, LDLDIRECT    Wt Readings from Last 3 Encounters:  05/07/15 220 lb (99.791 kg)  04/23/15 218 lb (98.884 kg)  04/06/15 217 lb (98.431 kg)      ASSESSMENT AND PLAN:  1  CAD  Pt with CAD on CT scan  Stress test in past  SHe does not have any symptoms to suggest angina    2.  HTN   Good control  3.  HL  Get lipids today  4. DM   5.  Bruit Carotid dopplers to evaluate   6.  Pulm  COntinue with oxygen    1 year f/u    Current medicines are reviewed at length with the patient today.  The patient does not have concerns regarding medicines.  The following changes have been made:   Labs/ tests ordered today include: No orders of the defined types were placed in this encounter.      Disposition:   FU with  in   Signed, 04/08/15, MD  05/07/2015 11:13 AM    [facility] [address] [place], [place], [place]  [place] Phone: [phone]; Fax: [phone]

## 2015-05-07 NOTE — Telephone Encounter (Signed)
Provided info to medical records.

## 2015-05-07 NOTE — Telephone Encounter (Signed)
New message      Dr Sabino Snipes  [phone] and fax [phone]) is patients doctor in [place].  She was seen today and was told to call back with this info

## 2015-05-07 NOTE — Patient Instructions (Signed)
Medication Instructions: - no changes  Labwork: - Your physician recommends that you have lab work today: lipid  Procedures/Testing: - Your physician has requested that you have a carotid duplex. This test is an ultrasound of the carotid arteries in your neck. It looks at blood flow through these arteries that supply the brain with blood. Allow one hour for this exam. There are no restrictions or special instructions.  Follow-Up: - Your physician wants you to follow-up in: 1 year with Dr. Tenny Craw. You will receive a reminder letter in the mail two months in advance. If you don't receive a letter, please call our office to schedule the follow-up appointment.  Any Additional Special Instructions Will Be Listed Below (If Applicable). - none

## 2015-05-12 ENCOUNTER — Encounter (HOSPITAL_COMMUNITY): Payer: Medicare HMO

## 2015-05-12 ENCOUNTER — Other Ambulatory Visit: Payer: Self-pay | Admitting: Physician Assistant

## 2015-05-12 ENCOUNTER — Telehealth: Payer: Self-pay | Admitting: Internal Medicine

## 2015-05-12 DIAGNOSIS — R5381 Other malaise: Secondary | ICD-10-CM

## 2015-05-12 NOTE — Telephone Encounter (Signed)
Spoke with JJ. She has not tried calling patient.  Called pt. She was given her vq scan recently. Nothing further needed

## 2015-05-12 NOTE — Telephone Encounter (Signed)
Patient states she is returning Jessica's call.  If call can be returned by [phone] before 12:30 and at [phone] after 12:30.

## 2015-05-12 NOTE — Telephone Encounter (Signed)
ROI faxed to [facility] records received back placed in chart prep bin.

## 2015-05-18 ENCOUNTER — Other Ambulatory Visit: Payer: Self-pay | Admitting: Physician Assistant

## 2015-05-18 DIAGNOSIS — M8580 Other specified disorders of bone density and structure, unspecified site: Secondary | ICD-10-CM

## 2015-05-19 ENCOUNTER — Other Ambulatory Visit (HOSPITAL_BASED_OUTPATIENT_CLINIC_OR_DEPARTMENT_OTHER): Payer: Medicare HMO

## 2015-05-19 DIAGNOSIS — D509 Iron deficiency anemia, unspecified: Secondary | ICD-10-CM | POA: Diagnosis not present

## 2015-05-19 DIAGNOSIS — D500 Iron deficiency anemia secondary to blood loss (chronic): Secondary | ICD-10-CM

## 2015-05-19 DIAGNOSIS — E119 Type 2 diabetes mellitus without complications: Secondary | ICD-10-CM

## 2015-05-19 LAB — CBC & DIFF AND RETIC
BASO%: 0 % (ref 0.0–2.0)
Basophils Absolute: 0 10*3/uL (ref 0.0–0.1)
EOS%: 3.5 % (ref 0.0–7.0)
Eosinophils Absolute: 0.2 10*3/uL (ref 0.0–0.5)
HCT: 36.5 % (ref 34.8–46.6)
HGB: 11.6 g/dL (ref 11.6–15.9)
IMMATURE RETIC FRACT: 6.7 % (ref 1.60–10.00)
LYMPH#: 1.6 10*3/uL (ref 0.9–3.3)
LYMPH%: 27.4 % (ref 14.0–49.7)
MCH: 29.8 pg (ref 25.1–34.0)
MCHC: 31.8 g/dL (ref 31.5–36.0)
MCV: 93.8 fL (ref 79.5–101.0)
MONO#: 0.4 10*3/uL (ref 0.1–0.9)
MONO%: 6.5 % (ref 0.0–14.0)
NEUT#: 3.6 10*3/uL (ref 1.5–6.5)
NEUT%: 62.6 % (ref 38.4–76.8)
Platelets: 253 10*3/uL (ref 145–400)
RBC: 3.89 10*6/uL (ref 3.70–5.45)
RDW: 17.7 % — AB (ref 11.2–14.5)
Retic %: 1.78 % (ref 0.70–2.10)
Retic Ct Abs: 69.24 10*3/uL (ref 33.70–90.70)
WBC: 5.7 10*3/uL (ref 3.9–10.3)

## 2015-05-20 ENCOUNTER — Ambulatory Visit (HOSPITAL_COMMUNITY)
Admission: RE | Admit: 2015-05-20 | Discharge: 2015-05-20 | Disposition: A | Discharge status: Home / Self Care | Payer: Medicare HMO | Source: Ambulatory Visit | Attending: Internal Medicine | Admitting: Internal Medicine

## 2015-05-20 ENCOUNTER — Encounter (HOSPITAL_COMMUNITY): Payer: Medicare HMO

## 2015-05-20 DIAGNOSIS — R0989 Other specified symptoms and signs involving the circulatory and respiratory systems: Secondary | ICD-10-CM | POA: Diagnosis present

## 2015-05-20 DIAGNOSIS — E119 Type 2 diabetes mellitus without complications: Secondary | ICD-10-CM | POA: Diagnosis not present

## 2015-05-20 DIAGNOSIS — I10 Essential (primary) hypertension: Secondary | ICD-10-CM | POA: Diagnosis not present

## 2015-05-20 DIAGNOSIS — I6523 Occlusion and stenosis of bilateral carotid arteries: Secondary | ICD-10-CM | POA: Insufficient documentation

## 2015-05-20 NOTE — Progress Notes (Signed)
Bilateral carotid artery duplex completed:  1-39% ICA stenosis.  Vertebral artery flow is antegrade.     

## 2015-05-21 ENCOUNTER — Other Ambulatory Visit: Payer: Self-pay | Admitting: *Deleted

## 2015-05-21 ENCOUNTER — Ambulatory Visit (HOSPITAL_BASED_OUTPATIENT_CLINIC_OR_DEPARTMENT_OTHER): Payer: Medicare HMO

## 2015-05-21 ENCOUNTER — Ambulatory Visit: Payer: Medicare HMO

## 2015-05-21 ENCOUNTER — Telehealth: Payer: Self-pay | Admitting: Internal Medicine

## 2015-05-21 DIAGNOSIS — D509 Iron deficiency anemia, unspecified: Secondary | ICD-10-CM

## 2015-05-21 DIAGNOSIS — D500 Iron deficiency anemia secondary to blood loss (chronic): Secondary | ICD-10-CM

## 2015-05-21 DIAGNOSIS — E119 Type 2 diabetes mellitus without complications: Secondary | ICD-10-CM | POA: Diagnosis not present

## 2015-05-21 LAB — CBC & DIFF AND RETIC
BASO%: 0 % (ref 0.0–2.0)
BASOS ABS: 0 10*3/uL (ref 0.0–0.1)
EOS%: 2.7 % (ref 0.0–7.0)
Eosinophils Absolute: 0.2 10*3/uL (ref 0.0–0.5)
HCT: 38.3 % (ref 34.8–46.6)
HEMOGLOBIN: 12.3 g/dL (ref 11.6–15.9)
IMMATURE RETIC FRACT: 6.7 % (ref 1.60–10.00)
LYMPH#: 1.7 10*3/uL (ref 0.9–3.3)
LYMPH%: 30.1 % (ref 14.0–49.7)
MCH: 29.9 pg (ref 25.1–34.0)
MCHC: 32.1 g/dL (ref 31.5–36.0)
MCV: 93.2 fL (ref 79.5–101.0)
MONO#: 0.4 10*3/uL (ref 0.1–0.9)
MONO%: 7 % (ref 0.0–14.0)
NEUT#: 3.4 10*3/uL (ref 1.5–6.5)
NEUT%: 60.2 % (ref 38.4–76.8)
Platelets: 272 10*3/uL (ref 145–400)
RBC: 4.11 10*6/uL (ref 3.70–5.45)
RDW: 17.4 % — AB (ref 11.2–14.5)
RETIC CT ABS: 71.93 10*3/uL (ref 33.70–90.70)
Retic %: 1.75 % (ref 0.70–2.10)
WBC: 5.6 10*3/uL (ref 3.9–10.3)

## 2015-05-21 LAB — IRON AND TIBC CHCC
%SAT: 15 % — AB (ref 21–57)
IRON: 43 ug/dL (ref 41–142)
TIBC: 288 ug/dL (ref 236–444)
UIBC: 245 ug/dL (ref 120–384)

## 2015-05-21 LAB — FERRITIN CHCC: Ferritin: 211 ng/ml (ref 9–269)

## 2015-05-21 NOTE — Progress Notes (Signed)
Late entry for 1300: Spoke with pt in lobby, informed her iron studies were not done on 7/6. Infusion is ordered for ferritin less than 100. Pt worked in for additional labs today, instructed her to call office on 7/11 to see if iron infusion is needed. If so she will return as scheduled on 7/15. Pt voiced understanding.

## 2015-05-21 NOTE — Telephone Encounter (Signed)
Pt wanted to inform that her transportation took her to the wrong facility yesterday for carotids, but there were performed anyway. (at the hosptial?) She wanted to know where her DEXA scan will be and I was unable to tell her.  She is calling PCP office to ask. Also thought Dr. Tenny Craw had ordered stress test.  Informed at this time I do not see orders or rec for stress test by Dr. Tenny Craw. Pt verbalizes understanding. Advised will call her when carotids are reviewed. Sent calendar of upcoming appointments this month to help her keep track.

## 2015-05-21 NOTE — Telephone Encounter (Signed)
NewMessage  Pt calling about possible stress test. Pt aware that no order is in the systfor stress test. Pt requested to speak w/ Rn. Please call back and discuss.

## 2015-05-24 ENCOUNTER — Other Ambulatory Visit: Payer: Medicare HMO

## 2015-05-28 ENCOUNTER — Ambulatory Visit: Payer: Medicare HMO

## 2015-05-31 ENCOUNTER — Other Ambulatory Visit: Payer: Medicare HMO

## 2015-06-02 ENCOUNTER — Ambulatory Visit
Admission: RE | Admit: 2015-06-02 | Discharge: 2015-06-02 | Disposition: A | Discharge status: Home / Self Care | Payer: Medicare HMO | Source: Ambulatory Visit | Attending: Physician Assistant | Admitting: Physician Assistant

## 2015-06-02 DIAGNOSIS — M8580 Other specified disorders of bone density and structure, unspecified site: Secondary | ICD-10-CM

## 2015-06-03 ENCOUNTER — Other Ambulatory Visit: Payer: Self-pay | Admitting: Internal Medicine

## 2015-06-03 DIAGNOSIS — Z1231 Encounter for screening mammogram for malignant neoplasm of breast: Secondary | ICD-10-CM

## 2015-06-08 ENCOUNTER — Ambulatory Visit: Payer: Medicare HMO | Admitting: Cardiology

## 2015-06-09 ENCOUNTER — Ambulatory Visit: Payer: Medicare HMO | Admitting: Internal Medicine

## 2015-06-13 ENCOUNTER — Ambulatory Visit (HOSPITAL_BASED_OUTPATIENT_CLINIC_OR_DEPARTMENT_OTHER): Payer: Medicare HMO | Attending: Adult Health

## 2015-06-13 VITALS — Ht 61.0 in | Wt 218.0 lb

## 2015-06-13 DIAGNOSIS — R0683 Snoring: Secondary | ICD-10-CM | POA: Insufficient documentation

## 2015-06-13 DIAGNOSIS — I493 Ventricular premature depolarization: Secondary | ICD-10-CM | POA: Diagnosis not present

## 2015-06-13 DIAGNOSIS — G4736 Sleep related hypoventilation in conditions classified elsewhere: Secondary | ICD-10-CM | POA: Diagnosis not present

## 2015-06-13 DIAGNOSIS — J9601 Acute respiratory failure with hypoxia: Secondary | ICD-10-CM | POA: Diagnosis present

## 2015-06-13 DIAGNOSIS — G4733 Obstructive sleep apnea (adult) (pediatric): Secondary | ICD-10-CM | POA: Insufficient documentation

## 2015-06-14 ENCOUNTER — Ambulatory Visit (HOSPITAL_BASED_OUTPATIENT_CLINIC_OR_DEPARTMENT_OTHER): Payer: Medicare HMO | Admitting: Pulmonary Disease

## 2015-06-14 DIAGNOSIS — G4733 Obstructive sleep apnea (adult) (pediatric): Secondary | ICD-10-CM | POA: Diagnosis not present

## 2015-06-14 NOTE — Progress Notes (Signed)
Patient Name: Anita Gonzalez, Anita Gonzalez Date: 06/13/2015 Gender: Female D.O.B: 07/01/1946 Age (years): 34 Referring Provider: Not Available Height (inches): 61 Interpreting Physician: Cyril Mourning MD, ABSM Weight (lbs): 218 RPSGT: Melburn Popper BMI: 41 MRN: [id number] Neck Size: 14.00  CLINICAL INFORMATION Sleep Study Type: NPSG  Indication for sleep study: 69 year old female with hypoxia on home O2. Has a history of type 2 diabetes, hypertension, hyperlipidemia, asthma not otherwise specified, rheumatoid arthritis, former smoker and significant obesity  Epworth Sleepiness Score: 5    SLEEP STUDY TECHNIQUE As per the AASM Manual for the Scoring of Sleep and Associated Events v2.3 (April 2016) with a hypopnea requiring 4% desaturations.  The channels recorded and monitored were frontal, central and occipital EEG, electrooculogram (EOG), submentalis EMG (chin), nasal and oral airflow, thoracic and abdominal wall motion, anterior tibialis EMG, snore microphone, electrocardiogram, and pulse oximetry.  MEDICATIONS  Medications self-administered by patient during sleep study : No sleep medicine administered.  SLEEP ARCHITECTURE The study was initiated at 11:04:29 PM and ended at 5:10:43 AM.  Sleep onset time was 42.5 minutes and the sleep efficiency was 41.2%. The total sleep time was 150.7 minutes.  Stage REM latency was 273.5 minutes.  The patient spent 13.27% of the night in stage N1 sleep, 59.53% in stage N2 sleep, 23.55% in stage N3 and 3.65% in REM.  Alpha intrusion was absent.  Supine sleep was 16.93%.  RESPIRATORY PARAMETERS The overall apnea/hypopnea index (AHI) was 11.1 per hour. There were 2 total apneas, including 2 obstructive, 0 central and 0 mixed apneas. There were 26 hypopneas and 14 RERAs.  The AHI during Stage REM sleep was 54.5 per hour.  AHI while supine was 23.5 per hour.  The mean oxygen saturation was 90.76%. The minimum SpO2 during sleep was  62.00%.  Loud snoring was noted during this study.  CARDIAC DATA The 2 lead EKG demonstrated sinus rhythm. The mean heart rate was 61.04 beats per minute. Other EKG findings include: PVCs.  LEG MOVEMENT DATA The total PLMS were 82 with a resulting PLMS index of 32.64. Associated arousal with leg movement index was 6.4 .  IMPRESSIONS Mild obstructive sleep apnea occurred during this study (AHI = 11.1/h). No significant central sleep apnea occurred during this study (CAI = 0.0/h). Severe oxygen desaturation was noted during this study (Min O2 = 62.00%) especially during REM sleep The patient snored with Loud snoring volume. EKG findings include PVCs. Moderate periodic limb movements of sleep occurred during the study. Associated arousals were not significant.  DIAGNOSIS Mild Obstructive Sleep Apnea (327.23 [G47.33 ICD-10]) Nocturnal Hypoxemia (327.26 [G47.36 ICD-10]) especially during REM sleep  RECOMMENDATIONS Options include no treatment & weight loss alone and/or  CPAP therapy Positional therapy avoiding supine position during sleep. Avoid alcohol, sedatives and other CNS depressants that may worsen sleep apnea and disrupt normal sleep architecture. Sleep hygiene should be reviewed to assess factors that may improve sleep quality. Weight management and regular exercise should be initiated or continued if appropriate.  Cyril Mourning MD. Tonny Bollman. [facility]

## 2015-06-14 NOTE — Addendum Note (Signed)
Addended by: Cyril Mourning V on: 06/14/2015 12:48 PM   Modules accepted: Level of Service

## 2015-06-17 ENCOUNTER — Telehealth: Payer: Self-pay | Admitting: Hematology

## 2015-06-17 NOTE — Telephone Encounter (Signed)
pt cld left voice mail no reference to why she was calling-cld pt back left message i was returning call-was told she was @ a Dr appt-adv to call Dr Georga Bora office back

## 2015-06-18 ENCOUNTER — Ambulatory Visit
Admission: RE | Admit: 2015-06-18 | Discharge: 2015-06-18 | Disposition: A | Discharge status: Home / Self Care | Payer: Medicare HMO | Source: Ambulatory Visit | Attending: Internal Medicine | Admitting: Internal Medicine

## 2015-06-18 DIAGNOSIS — Z1231 Encounter for screening mammogram for malignant neoplasm of breast: Secondary | ICD-10-CM

## 2015-07-20 ENCOUNTER — Ambulatory Visit (INDEPENDENT_AMBULATORY_CARE_PROVIDER_SITE_OTHER): Payer: Medicare HMO | Admitting: Internal Medicine

## 2015-07-20 ENCOUNTER — Encounter: Payer: Self-pay | Admitting: Internal Medicine

## 2015-07-20 VITALS — BP 108/76 | HR 88 | Ht 61.0 in | Wt 223.0 lb

## 2015-07-20 DIAGNOSIS — R0689 Other abnormalities of breathing: Secondary | ICD-10-CM | POA: Diagnosis not present

## 2015-07-20 DIAGNOSIS — R0600 Dyspnea, unspecified: Secondary | ICD-10-CM

## 2015-07-20 DIAGNOSIS — R918 Other nonspecific abnormal finding of lung field: Secondary | ICD-10-CM | POA: Diagnosis not present

## 2015-07-20 DIAGNOSIS — G4733 Obstructive sleep apnea (adult) (pediatric): Secondary | ICD-10-CM | POA: Diagnosis not present

## 2015-07-20 LAB — NITRIC OXIDE: Nitric Oxide: 30

## 2015-07-20 NOTE — Progress Notes (Signed)
Subjective:    Patient ID: Anita Gonzalez, female    DOB: 07-Oct-1946, 69 y.o.   MRN: [id number]  HPI  PCP OSEI-BONSU,GEORGE, MD  HPI  IOV 04/06/2015  Chief Complaint  Patient presents with  . Pulmonary Consult    Pt referred by Raelyn Number, PA for O2 dependence.    69 year old female moved from [place] to [place] in 2016. Has a history of type 2 diabetes, hypertension, hyperlipidemia, asthma not otherwise specified, rheumatoid arthritis, former smoker and significant obesity  She started good historian. The history is minimal elevated from review of the outside records from 02/03/2015 and review of [place] medical records from February 2016. And in talking to the patient.  At baseline she is living in [place], [place]. She carries a long-standing diagnosis of asthma for which she takes possibly Advair on a non-compliant basis and albuterol as needed. Because of this she has chronic cough and shortness of breath with exertion and relieved by rest. She moved into December 2015 to [place], [place] to be close to her daughter. Then around 01/06/2015 she developed acute worsening of cough with mucus production and was hospitalized under the tried [employer] for 5 days. Chest x-ray to admission documented below showed right lower lobe consolidation. She was treated as community acquired pneumonia Since then she's been placed on oxygen at discharge. Prior to that she was never on oxygen. She is unaware if she was having exertional hypoxemia prior to this admission. Currently she feels that the pneumonia has resolved and she is back to her baseline health. In the office today when she exerted she did desaturate to 60%. However she does not feel this.   In terms of her past medical history - Smoking: 18.5 prior smoking history  - RA: REports RA NOS - not able to give me details. Not on immune modulator. Not seen rheumatlogist in [place] - ASthma: as above. LAst admit 2  years ago due to aeasthma - ILD - denies =-OSA: denies testing or diagnosis but appears to be high risk for it  Chest x-ray from 01/06/2015 personally visualized image shows right lower lobe consolidation. Agree with the formal interpretation  Outside lab tests from primary care physician reviewed dated 02/03/2015 ESR 34, ANA negative, rheumatoid factor normal, normal TSH creatinine 0.9 mg percent. Abnormal values include hemoglobin of 9 g percent [in the hospital hemoglobin fluctuated between 7.5 g percent and 8.4 g percent. Most recent hemoglobin 03/26/2015 shows improvement of 10.6 g percent.   has a past medical history of Diabetes mellitus without complication; Asthma; Arthritis; Hypertension; Rheumatoid arthritis (1996); GERD (gastroesophageal reflux disease); Anemia; and Hay fever.   reports that she quit smoking about 4 years ago. Her smoking use included Cigarettes. She has a 18.75 pack-year smoking history. She has never used smokeless tobacco.   04/23/2015 Follow up  Patient returns for two-week follow-up She was seen for new pulmonary consult for dyspnea and hypoxia. Patient was admitted in February for pneumonia and treated with anabiotic's. She was placed on oxygen at discharge due to persistent desaturations. She is a former smoker. Patient was set up for a CT chest that showed mild diffuse bronchiectasis, very mild groundglass attenuation. Atherosclerosis. No evidence of interstitial lung disease. PFT shows restriction with a low diffusing capacity. FEV1 was 1.20 L, 72%, ratio 88, positive broncho-dilator response. Diffusing capacity 35% (PFTdid show patient had difficulty with DLCO component) 2-D echo showed an EF of 60-65%,, grade 1 diastolic dysfunction. Patient says she was  seen in [place] by cardiologist but has not establish here in [place] . Patient is currently on oxygen 2 L with activity and at bedtime. Has been told that she snores and does have some daytime  sleepiness. Last visit. Patient had desaturations with ambulation.  REC Sleep study VQ Cards referral   OV 07/20/2015  Chief Complaint  Patient presents with  . Follow-up    Pt here after OV with TP. Pt had sleep study, VQ scan, and f/u with cards. Pt denies change in breathing. Pt denies cough and CP/tightness.    Fu dyspnea, hypoxemia  - Here for follow-up to review test results. In talking to her I could not figure out if she is better or not. It appears that she might still have baseline shortness of breath. She uses oxygen at random. Unclear how much Advair she is using or how regular she is with it. Below the test results reviewed at this visit  - VQ scan 04/30/2015: Very low probability of pulmonary embolism  - Nuclear medicine myocardial perfusion study 05/07/2015: Normal study with normal stress EKG, ejection fraction 83% an extremely low risk study  - Split night sleep study 06/13/2015: t. Personally visualized image and reports. It appears she has had desaturations and an apnea hypopnea events. Final report by Dr. Kara Mead notes overall 11 apnea hypopnea events per hour. Significant nocturnal desaturations. Final diagnosis is mild obstructive sleep apnea without central sleep apnea and significant nocturnal hypoxemia. Recommendation is for weight loss/CPAP therapy and position therapy during sleep. Loud snoring reported during sleep  FEno - 30ppb and somehwat elevated today 07/20/2015    Current outpatient prescriptions:  .  albuterol (PROVENTIL HFA;VENTOLIN HFA) 108 (90 BASE) MCG/ACT inhaler, Inhale 2 puffs into the lungs every 6 (six) hours as needed for wheezing or shortness of breath (wheezing). , Disp: , Rfl:  .  bumetanide (BUMEX) 2 MG tablet, Take 2 mg by mouth 2 (two) times a week. Monday and Thursday., Disp: , Rfl:  .  clobetasol (TEMOVATE) 0.05 % GEL, Apply 1 application topically 2 (two) times daily., Disp: 30 each, Rfl: 0 .  colchicine 0.6 MG tablet, Take 0.6  mg by mouth daily. , Disp: , Rfl:  .  cyanocobalamin 1000 MCG tablet, Take 100 mcg by mouth once a week. Monday, Disp: , Rfl:  .  cyclobenzaprine (FLEXERIL) 10 MG tablet, Take 10 mg by mouth every morning. , Disp: , Rfl:  .  diclofenac sodium (VOLTAREN) 1 % GEL, Apply 2 g topically 2 (two) times daily., Disp: , Rfl:  .  ergocalciferol (VITAMIN D2) 50000 UNITS capsule, Take 50,000 Units by mouth once a week. Thursday., Disp: , Rfl:  .  fluticasone (FLONASE) 50 MCG/ACT nasal spray, Place 1 spray into both nostrils daily as needed for allergies or rhinitis., Disp: , Rfl:  .  Fluticasone-Salmeterol (ADVAIR) 500-50 MCG/DOSE AEPB, Inhale 2 puffs into the lungs daily. (Patient taking differently: Inhale 1 puff into the lungs 2 (two) times daily. ), Disp: 3 each, Rfl: 0 .  folic acid (FOLVITE) 1 MG tablet, Take 1 mg by mouth daily., Disp: , Rfl:  .  loperamide (IMODIUM A-D) 2 MG tablet, Take 2 mg by mouth 4 (four) times daily as needed for diarrhea or loose stools., Disp: , Rfl:  .  metFORMIN (GLUCOPHAGE) 500 MG tablet, Take 500 mg by mouth 2 (two) times daily., Disp: , Rfl:  .  metoprolol tartrate (LOPRESSOR) 25 MG tablet, Take 1 tablet (25 mg total) by mouth  2 (two) times daily. (Patient taking differently: Take 25 mg by mouth at bedtime. ), Disp: 60 tablet, Rfl: 0 .  montelukast (SINGULAIR) 10 MG tablet, Take 10 mg by mouth at bedtime., Disp: , Rfl:  .  Multiple Vitamin (MULTIVITAMIN) tablet, Take 1 tablet by mouth daily. With iron., Disp: , Rfl:  .  omeprazole (PRILOSEC) 20 MG capsule, Take 20 mg by mouth daily., Disp: , Rfl:  .  potassium chloride (K-DUR,KLOR-CON) 10 MEQ tablet, Take 10 mEq by mouth every other day. , Disp: , Rfl:  .  simvastatin (ZOCOR) 20 MG tablet, Take 20 mg by mouth every evening. , Disp: , Rfl:  .  verapamil (VERELAN PM) 360 MG 24 hr capsule, Take 1 capsule (360 mg total) by mouth daily., Disp: 30 capsule, Rfl: 0    Review of Systems  Constitutional: Negative for fever and  unexpected weight change.  HENT: Negative for congestion, dental problem, ear pain, nosebleeds, postnasal drip, rhinorrhea, sinus pressure, sneezing, sore throat and trouble swallowing.   Eyes: Negative for redness and itching.  Respiratory: Positive for shortness of breath. Negative for cough, chest tightness and wheezing.   Cardiovascular: Negative for palpitations and leg swelling.  Gastrointestinal: Negative for nausea and vomiting.  Genitourinary: Negative for dysuria.  Musculoskeletal: Negative for joint swelling.  Skin: Negative for rash.  Neurological: Negative for headaches.  Hematological: Does not bruise/bleed easily.  Psychiatric/Behavioral: Negative for dysphoric mood. The patient is not nervous/anxious.        Objective:   Physical Exam  Constitutional: She is oriented to person, place, and time. She appears well-developed and well-nourished. No distress.  Body mass index is 42.16 kg/(m^2).   HENT:  Head: Normocephalic and atraumatic.  Right Ear: External ear normal.  Left Ear: External ear normal.  Mouth/Throat: Oropharynx is clear and moist. No oropharyngeal exudate.  ? Proptosis Mallampatti class 3-4  Eyes: Conjunctivae and EOM are normal. Pupils are equal, round, and reactive to light. Right eye exhibits no discharge. Left eye exhibits no discharge. No scleral icterus.  Neck: Normal range of motion. Neck supple. No JVD present. No tracheal deviation present. No thyromegaly present.  Cardiovascular: Normal rate, regular rhythm, normal heart sounds and intact distal pulses.  Exam reveals no gallop and no friction rub.   No murmur heard. Pulmonary/Chest: Effort normal and breath sounds normal. No respiratory distress. She has no wheezes. She has no rales. She exhibits no tenderness.  Abdominal: Soft. Bowel sounds are normal. She exhibits no distension and no mass. There is no tenderness. There is no rebound and no guarding.  Musculoskeletal: Normal range of motion. She  exhibits no edema or tenderness.  Has cane  Lymphadenopathy:    She has no cervical adenopathy.  Neurological: She is alert and oriented to person, place, and time. She has normal reflexes. No cranial nerve deficit. She exhibits normal muscle tone. Coordination normal.  Skin: Skin is warm and dry. No rash noted. She is not diaphoretic. No erythema. No pallor.  Psychiatric: She has a normal mood and affect. Her behavior is normal. Judgment and thought content normal.  Vitals reviewed.  Filed Vitals:   07/20/15 1616  BP: 108/76  Pulse: 88  Height: '5\' 1"'  (1.549 m)  Weight: 223 lb (101.152 kg)  SpO2: 96%          Assessment & Plan:     ICD-9-CM ICD-10-CM   1. Dyspnea and respiratory abnormality 786.09 R06.00     R06.89   2. Pulmonary infiltrate 793.19  R91.8   3. OSA (obstructive sleep apnea) 327.23 G47.33     #Shortness of breath and Pulmonary Infiltrates   -A lot of your shortness of breath is due to weight, physica, deconditioning and heart muscle not relaxing well; called diastolic dysfunction -  ASthma is playing a role as well - Unclear what extent pulmonary infiltrate on CT is playng a role  Plan - flu shot in fall  - refer pulmonary rehab  - continue  advair 500/50 1  Puff twice daily - continue singulair daily as before - Do HRCT wo contrast supine and prone in 9 months   #Sleep apnea  - refer Dr Elsworth Soho  #followup  - CT chest in 9 months  - FU to see me in 9 months   > 50% of this > 25 min visit spent in face to face counseling or coordination of care   Dr. Brand Males, M.D., [facility].C.P Pulmonary and Critical Care Medicine Staff Physician [place] [facility] Pager: [phone], If no answer or between  15:00h - 7:00h: call [phone]  07/20/2015 5:47 PM

## 2015-07-20 NOTE — Patient Instructions (Addendum)
ICD-9-CM ICD-10-CM   1. Dyspnea and respiratory abnormality 786.09 R06.00     R06.89   2. Pulmonary infiltrate 793.19 R91.8   3. OSA (obstructive sleep apnea) 327.23 G47.33    #Shortness of breath and Pulmonary Infiltrates   -A lot of your shortness of breath is due to weight, physica, deconditioning and heart muscle not relaxing well; called diastolic dysfunction -  ASthma is playing a role as well - Unclear what extent pulmonary infiltrate on CT is playng a role  Plan - flu shot in fall  - refer pulmonary rehab  - continue  advair 500/50 1  Puff twice daily - continue singulair daily as before - Do HRCT wo contrast supine and prone in 9 months   #Sleep apnea  - refer Dr Vassie Loll  #followup  - CT chest in 9 months  - FU to see me in 9 months

## 2015-07-21 ENCOUNTER — Other Ambulatory Visit (HOSPITAL_BASED_OUTPATIENT_CLINIC_OR_DEPARTMENT_OTHER): Payer: Medicare HMO

## 2015-07-21 ENCOUNTER — Telehealth: Payer: Self-pay | Admitting: Pulmonary Disease

## 2015-07-21 DIAGNOSIS — D509 Iron deficiency anemia, unspecified: Secondary | ICD-10-CM

## 2015-07-21 DIAGNOSIS — E119 Type 2 diabetes mellitus without complications: Secondary | ICD-10-CM | POA: Diagnosis not present

## 2015-07-21 DIAGNOSIS — D500 Iron deficiency anemia secondary to blood loss (chronic): Secondary | ICD-10-CM

## 2015-07-21 LAB — CBC & DIFF AND RETIC
BASO%: 0.2 % (ref 0.0–2.0)
BASOS ABS: 0 10*3/uL (ref 0.0–0.1)
EOS%: 2.8 % (ref 0.0–7.0)
Eosinophils Absolute: 0.2 10*3/uL (ref 0.0–0.5)
HEMATOCRIT: 37.8 % (ref 34.8–46.6)
HEMOGLOBIN: 12.4 g/dL (ref 11.6–15.9)
Immature Retic Fract: 7.8 % (ref 1.60–10.00)
LYMPH%: 33 % (ref 14.0–49.7)
MCH: 31.3 pg (ref 25.1–34.0)
MCHC: 32.8 g/dL (ref 31.5–36.0)
MCV: 95.5 fL (ref 79.5–101.0)
MONO#: 0.4 10*3/uL (ref 0.1–0.9)
MONO%: 7.4 % (ref 0.0–14.0)
NEUT#: 3.1 10*3/uL (ref 1.5–6.5)
NEUT%: 56.6 % (ref 38.4–76.8)
Platelets: 292 10*3/uL (ref 145–400)
RBC: 3.96 10*6/uL (ref 3.70–5.45)
RDW: 14.1 % (ref 11.2–14.5)
RETIC %: 2.59 % — AB (ref 0.70–2.10)
RETIC CT ABS: 102.56 10*3/uL — AB (ref 33.70–90.70)
WBC: 5.4 10*3/uL (ref 3.9–10.3)
lymph#: 1.8 10*3/uL (ref 0.9–3.3)

## 2015-07-21 NOTE — Telephone Encounter (Signed)
Called and spoke with pt and she stated that she gave the transportation papers to the nurse at her appt with MR on 07/20/15.  MR do you have these papers?:

## 2015-07-21 NOTE — Telephone Encounter (Signed)
Yes, form has already been completed and faxed back to [facility] transportation.

## 2015-07-21 NOTE — Telephone Encounter (Signed)
cjeck with Robynn Pane please. I do not know if I have it or not. Sending to [employer]

## 2015-07-22 NOTE — Telephone Encounter (Signed)
Called made pt aware. Nothing further needed 

## 2015-07-23 ENCOUNTER — Ambulatory Visit (HOSPITAL_BASED_OUTPATIENT_CLINIC_OR_DEPARTMENT_OTHER): Payer: Medicare HMO | Admitting: Hematology

## 2015-07-23 ENCOUNTER — Telehealth: Payer: Self-pay | Admitting: Hematology

## 2015-07-23 ENCOUNTER — Ambulatory Visit: Payer: Medicare HMO

## 2015-07-23 ENCOUNTER — Encounter: Payer: Self-pay | Admitting: Hematology

## 2015-07-23 VITALS — BP 147/81 | HR 92 | Temp 98.2°F | Resp 17 | Ht 61.0 in | Wt 218.8 lb

## 2015-07-23 DIAGNOSIS — D509 Iron deficiency anemia, unspecified: Secondary | ICD-10-CM

## 2015-07-23 DIAGNOSIS — E119 Type 2 diabetes mellitus without complications: Secondary | ICD-10-CM | POA: Diagnosis not present

## 2015-07-23 DIAGNOSIS — M069 Rheumatoid arthritis, unspecified: Secondary | ICD-10-CM | POA: Diagnosis not present

## 2015-07-23 DIAGNOSIS — I10 Essential (primary) hypertension: Secondary | ICD-10-CM | POA: Diagnosis not present

## 2015-07-23 NOTE — Progress Notes (Signed)
[facility]  Telephone:([phone]) [phone] Fax:(336) [phone]  Clinic New consult Note   Patient Care Team: [name], MD as PCP - General (Internal Medicine) Jearld Lesch, MD as Referring Physician (Specialist) Jearld Lesch, MD as Referring Physician (Specialist) Jeani Hawking, MD as Consulting Physician (Gastroenterology) Clyde Lundborg., MD as Referring Physician (Anesthesiology) 07/23/2015  CHIEF COMPLAINT:  Follow-up iron deficient anemia  HISTORY OF PRESENTING ILLNESS (01/2015):  Anita Gonzalez with history of DM 2, HTN, HLD, asthma, rheumatoid arthritis, former smoker, recently moved to this area from [place], is here because of anemia.   She presented to the ED on 01/06/2015 with fever, chills, productive cough, dyspnea, wheezing, chest pain and poor appetite of ~ 1 week duration. In the ED, febrile to 102.14F, tachycardic, WBC 12.5 and chest x-ray suggestive of right lower lobe pneumonia. Treating for CAP, then developed acute kidney injury-improving and narrow complex tachycardia 2/27. Her clinical condition improved and she was discharged home with oxygen on 01/11/2015. During her hospital stay, she was found to have moderate anemia with hemoglobin 8-9, low serum iron and ferritin level. She was referred to our clinic for further evaluation.  She was found to have abnormal CBC from 2011, and has been on iron pill two pill daily, and B12 injection and pill for the past two years. She had EGD, capsule endoscopy and colonoscopy in [place] a few years ago by Dr. Francella Solian, which was normal per pt.   She denies recent chest pain on exertion, (+) shortness of breath on exertion at night, no pre-syncopal episodes, or palpitations. She had not noticed any recent bleeding such as epistaxis, hematuria or hematochezia The patient denies over the counter NSAID ingestion. She is not on antiplatelets agents. She had no prior history or diagnosis of  cancer. Her age appropriate screening programs are up-to-date. She denies any pica and eats a variety of diet. She never donated blood or received blood transfusion  She is still on oxygen. She has some dyspnea at night. No orthopnea. She is able to do all her ADLs and light housework. She was not very physically active even before the hospitalization last month.  INTERIM HISTORY: [name] returns for follow-up. She is doing well overall. She take iron pill twice a day. She had colonoscopy in the end of May this year, which showed a few AVM in cecum area. She has knee and leg pain from her arthritis, stable. No other new complains.   MEDICAL HISTORY:  Past Medical History  Diagnosis Date  . Diabetes mellitus without complication   . Asthma   . Arthritis   . Hypertension   . Rheumatoid arthritis 1996  . GERD (gastroesophageal reflux disease)   . Anemia   . Hay fever     SURGICAL HISTORY: Past Surgical History  Procedure Laterality Date  . Cesarean section    . Hernia repair    . Hemorrhoid surgery    . Cesarean section    . Hernia repair    . Abdominal hysterectomy    . Esophagogastroduodenoscopy (egd) with propofol N/A 04/02/2015    Procedure: ESOPHAGOGASTRODUODENOSCOPY (EGD) WITH PROPOFOL;  Surgeon: Jeani Hawking, MD;  Location: [facility] ENDOSCOPY;  Service: Endoscopy;  Laterality: N/A;  . Colonoscopy with propofol N/A 04/02/2015    Procedure: COLONOSCOPY WITH PROPOFOL;  Surgeon: Jeani Hawking, MD;  Location: [facility] ENDOSCOPY;  Service: Endoscopy;  Laterality: N/A;    SOCIAL HISTORY: Social History   Social History  .  Marital Status: Single    Spouse Name: N/A  . Number of Children: N/A  . Years of Education: N/A   Occupational History  . retired    Social History Main Topics  . Smoking status: Former Smoker -- 0.75 packs/day for 25 years    Types: Cigarettes    Quit date: 01/12/2011  . Smokeless tobacco: Never Used  . Alcohol Use: 2.4 oz/week    4 Cans of beer per week      Comment: occ  . Drug Use: No  . Sexual Activity: Not on file   Other Topics Concern  . Not on file   Social History Narrative    FAMILY HISTORY: Family History  Problem Relation Age of Onset  . Diabetes Mother   . Heart failure Mother   . Throat cancer Brother     throat cancer     ALLERGIES:  is allergic to fish allergy; peanuts; hydrocodone; iodinated diagnostic agents; other; oxycodone; penicillins; and percocet.  MEDICATIONS:  Current Outpatient Prescriptions  Medication Sig Dispense Refill  . albuterol (PROVENTIL HFA;VENTOLIN HFA) 108 (90 BASE) MCG/ACT inhaler Inhale 2 puffs into the lungs every 6 (six) hours as needed for wheezing or shortness of breath (wheezing).     . bumetanide (BUMEX) 2 MG tablet Take 2 mg by mouth 2 (two) times a week. Monday and Thursday.    . clobetasol (TEMOVATE) 0.05 % GEL Apply 1 application topically 2 (two) times daily. 30 each 0  . cyanocobalamin 1000 MCG tablet Take 100 mcg by mouth once a week. Monday    . cyclobenzaprine (FLEXERIL) 10 MG tablet Take 10 mg by mouth every morning.     . diclofenac sodium (VOLTAREN) 1 % GEL Apply 2 g topically 2 (two) times daily.    . ergocalciferol (VITAMIN D2) 50000 UNITS capsule Take 50,000 Units by mouth once a week. Thursday.    . ferrous sulfate 325 (65 FE) MG tablet Take 650 mg by mouth daily with breakfast.    . fluticasone (FLONASE) 50 MCG/ACT nasal spray Place 1 spray into both nostrils daily as needed for allergies or rhinitis.    . Fluticasone-Salmeterol (ADVAIR) 500-50 MCG/DOSE AEPB Inhale 2 puffs into the lungs daily. (Patient taking differently: Inhale 1 puff into the lungs 2 (two) times daily. ) 3 each 0  . folic acid (FOLVITE) 1 MG tablet Take 1 mg by mouth daily.    Marland Kitchen loperamide (IMODIUM A-D) 2 MG tablet Take 2 mg by mouth 4 (four) times daily as needed for diarrhea or loose stools.    . metFORMIN (GLUCOPHAGE) 500 MG tablet Take 500 mg by mouth 2 (two) times daily.    . metoprolol tartrate  (LOPRESSOR) 25 MG tablet Take 1 tablet (25 mg total) by mouth 2 (two) times daily. (Patient taking differently: Take 25 mg by mouth at bedtime. ) 60 tablet 0  . montelukast (SINGULAIR) 10 MG tablet Take 10 mg by mouth at bedtime.    . Multiple Vitamin (MULTIVITAMIN) tablet Take 1 tablet by mouth daily. With iron.    Marland Kitchen omeprazole (PRILOSEC) 20 MG capsule Take 20 mg by mouth daily.    . potassium chloride (K-DUR,KLOR-CON) 10 MEQ tablet Take 10 mEq by mouth every other day.     . simvastatin (ZOCOR) 20 MG tablet Take 20 mg by mouth every evening.     . verapamil (VERELAN PM) 360 MG 24 hr capsule Take 1 capsule (360 mg total) by mouth daily. 30 capsule 0   No  current facility-administered medications for this visit.    REVIEW OF SYSTEMS:   Constitutional: Denies fevers, chills or abnormal night sweats, (+) fatigue Eyes: Denies blurriness of vision, double vision or watery eyes Ears, nose, mouth, throat, and face: Denies mucositis or sore throat Respiratory: Denies cough, (+) dyspnea, no wheezes Cardiovascular: Denies palpitation, chest discomfort or lower extremity swelling Gastrointestinal:  Denies nausea, heartburn or change in bowel habits Skin: Denies abnormal skin rashes Lymphatics: Denies new lymphadenopathy or easy bruising Neurological:Denies numbness, tingling or new weaknesses Behavioral/Psych: Mood is stable, no new changes  All other systems were reviewed with the patient and are negative.  PHYSICAL EXAMINATION: ECOG PERFORMANCE STATUS: 2 - Symptomatic, <50% confined to bed  Filed Vitals:   07/23/15 1301  BP: 147/81  Pulse: 92  Temp: 98.2 F (36.8 C)  Resp: 17   Filed Weights   07/23/15 1301  Weight: 218 lb 12.8 oz (99.247 kg)    GENERAL:alert, no distress and comfortable SKIN: skin color, texture, turgor are normal, no rashes or significant lesions EYES: normal, conjunctiva are pink and non-injected, sclera clear OROPHARYNX:no exudate, no erythema and lips, buccal  mucosa, and tongue normal  NECK: supple, thyroid normal size, non-tender, without nodularity LYMPH:  no palpable lymphadenopathy in the cervical, axillary or inguinal LUNGS: clear to auscultation and percussion with normal breathing effort HEART: regular rate & rhythm and no murmurs and no lower extremity edema ABDOMEN:abdomen soft, non-tender and normal bowel sounds Musculoskeletal:no cyanosis of digits and no clubbing  PSYCH: alert & oriented x 3 with fluent speech NEURO: no focal motor/sensory deficits  LABORATORY DATA:  I have reviewed the data as listed CBC Latest Ref Rng 07/21/2015 05/21/2015 05/19/2015  WBC 3.9 - 10.3 10e3/uL 5.4 5.6 5.7  Hemoglobin 11.6 - 15.9 g/dL 40.9 81.1 91.4  Hematocrit 34.8 - 46.6 % 37.8 38.3 36.5  Platelets 145 - 400 10e3/uL 292 272 253    CMP Latest Ref Rng 02/25/2015 01/11/2015 01/10/2015  Glucose 70 - 140 mg/dl 89 782(N) 562(Z)  BUN 7.0 - 26.0 mg/dL 30.8 65(H) 84(O)  Creatinine 0.6 - 1.1 mg/dL 1.0 9.62(X) 5.28(U)  Sodium 136 - 145 mEq/L 142 142 136  Potassium 3.5 - 5.1 mEq/L 4.0 4.1 4.6  Chloride 96 - 112 mmol/L - 99 99  CO2 22 - 29 mEq/L 30(H) 33(H) 31  Calcium 8.4 - 10.4 mg/dL 8.8 9.3 9.3  Total Protein 6.4 - 8.3 g/dL 6.8 - -  Total Bilirubin 0.20 - 1.20 mg/dL 1.32 - -  Alkaline Phos 40 - 150 U/L 90 - -  AST 5 - 34 U/L 24 - -  ALT 0 - 55 U/L 23 - -   Results for KHYLEE, ALGEO (MRN [id number]) as of 07/23/2015 08:19  Ref. Range 05/21/2015 13:10  Iron Latest Ref Range: 41-142 ug/dL 43  UIBC Latest Ref Range: 120-384 ug/dL 366  TIBC Latest Ref Range: 236-444 ug/dL 440  %SAT Latest Ref Range: 21-57 % 15 (L)  Ferritin Latest Ref Range: 9-269 ng/ml 211     RADIOGRAPHIC STUDIES: I have personally reviewed the radiological images as listed and agreed with the findings in the report. No results found.  ASSESSMENT & PLAN: 69 year old African-American Gonzalez with past medical history of diabetes, hypertension, rheumatoid arthritis, who has chronic anemia for  a few years.  1. Normocytic anemia secondary to iron deficiency -She has moderate anemia with hemoglobin in 8-9 range, MCV normal, ferritin 16, serum iron 13, URBC elevated at 430, iron saturation 3%, this is consistent with  iron deficient anemia -Anemia has been a few years, likely secondary to slow GI bleeding. Her stool OB was positive. Colonoscopy, EGD, capsule endoscopy a few years ago was negative per patient, and she is scheduled to have a repeated colonoscopy next month.  -Her SPEP and UPEP were negative.  -She also may have a component of anemia of chronic disease secondary to kidney disease and rheumatoid arthritis -Her hemoglobin has significantly improved after IV Feraheme, hemoglobin 12.4 today. -She had a repeat colonoscopy in May 2016, and a few AVM was found and cauterized -She will continue oral iron supplement -We'll continue monitoring her CBC and iron level, consider IV Feraheme as needed  2. Hypertension, diabetes, rheumatoid arthritis -She will continue follow-up with her primary care physician.  Plan -Repeat a CBC, Feraheme and iron study in 2 months -I'll see him back in 4 months with repeated lab. -Consider IV Feraheme if ferritin less than 50 or very low serum iron level  All questions were answered. The patient knows to call the clinic with any problems, questions or concerns. I spent 20 minutes counseling the patient face to face. The total time spent in the appointment was 25 minutes and more than 50% was on counseling.     Malachy Mood, MD 07/23/2015 1:24 PM

## 2015-07-23 NOTE — Telephone Encounter (Signed)
Gave patient avs report and appointments for November 2016 thru January 2017. Per patient appointment for iron not needed. Check with desk nurse and patient not having iron. Iron infusions scheduled in two sessions but appointments cxd 9/9 and 9/16 - patient aware.

## 2015-07-28 ENCOUNTER — Telehealth: Payer: Self-pay | Admitting: *Deleted

## 2015-07-28 NOTE — Telephone Encounter (Signed)
Called [phone]. Spoke with [place] department. They are unable upload into chart but will fax report to me at [phone].

## 2015-07-28 NOTE — Telephone Encounter (Signed)
-----   Message from Jefferey Pica, RN sent at 06/25/2015 11:50 AM EDT -----   ----- Message -----    From: Rosiland Oz    Sent: 06/23/2015   2:30 PM      To: Jefferey Pica, RN  Looks like it was scheduled at [facility], cancelled because "patient having it done at the hospital".  There are tech notes indicating that it was done, but I don't see a report in the chart. You could call [phone], and ask where the report is. Missy ----- Message -----    From: Jefferey Pica, RN    Sent: 06/23/2015  11:25 AM      To: Melissa C Church  Just a question for you.Marland KitchenMarland KitchenI was trying to help with some of Renada Cronin's follow up for Dr. Tenny Craw.  This patient had a carotid US on 7/7, but the report is still reading as pending. Did she actually have this done?   Thanks! Sherri Rad, RN, BSN

## 2015-07-28 NOTE — Telephone Encounter (Signed)
Received report from carotid study. Placed in Dr. Charlott Rakes folder for her review.

## 2015-07-29 ENCOUNTER — Telehealth: Payer: Self-pay | Admitting: Hematology

## 2015-07-29 NOTE — Telephone Encounter (Signed)
S/w pt confirming labs/ov and r/s due to pt will be out of town, mailed out schedule... KJ

## 2015-07-30 ENCOUNTER — Ambulatory Visit: Payer: Medicare HMO

## 2015-08-27 ENCOUNTER — Telehealth (HOSPITAL_COMMUNITY): Payer: Self-pay

## 2015-08-27 NOTE — Telephone Encounter (Signed)
I have called and left a message with [name] to inquire about participation in Pulmonary Rehab per Dr. Jane Canary referral. Will follow up.

## 2015-09-03 ENCOUNTER — Institutional Professional Consult (permissible substitution): Payer: Medicare HMO | Admitting: Pulmonary Disease

## 2015-09-15 ENCOUNTER — Telehealth: Payer: Self-pay | Admitting: Hematology

## 2015-09-15 NOTE — Telephone Encounter (Signed)
pt called to r/s appt ....done ...pt ok adn aware of new d.t °

## 2015-09-17 ENCOUNTER — Telehealth: Payer: Self-pay | Admitting: Hematology

## 2015-09-17 ENCOUNTER — Ambulatory Visit (HOSPITAL_BASED_OUTPATIENT_CLINIC_OR_DEPARTMENT_OTHER): Payer: Medicare HMO

## 2015-09-17 ENCOUNTER — Other Ambulatory Visit: Payer: Medicare HMO

## 2015-09-17 DIAGNOSIS — D500 Iron deficiency anemia secondary to blood loss (chronic): Secondary | ICD-10-CM

## 2015-09-17 DIAGNOSIS — D509 Iron deficiency anemia, unspecified: Secondary | ICD-10-CM | POA: Diagnosis not present

## 2015-09-17 LAB — CBC & DIFF AND RETIC
BASO%: 0.2 % (ref 0.0–2.0)
Basophils Absolute: 0 10*3/uL (ref 0.0–0.1)
EOS ABS: 0.2 10*3/uL (ref 0.0–0.5)
EOS%: 4.4 % (ref 0.0–7.0)
HCT: 36.2 % (ref 34.8–46.6)
HGB: 11.5 g/dL — ABNORMAL LOW (ref 11.6–15.9)
Immature Retic Fract: 10.3 % — ABNORMAL HIGH (ref 1.60–10.00)
LYMPH%: 29.6 % (ref 14.0–49.7)
MCH: 31 pg (ref 25.1–34.0)
MCHC: 31.8 g/dL (ref 31.5–36.0)
MCV: 97.6 fL (ref 79.5–101.0)
MONO#: 0.4 10*3/uL (ref 0.1–0.9)
MONO%: 7.4 % (ref 0.0–14.0)
NEUT%: 58.4 % (ref 38.4–76.8)
NEUTROS ABS: 3.1 10*3/uL (ref 1.5–6.5)
Platelets: 248 10*3/uL (ref 145–400)
RBC: 3.71 10*6/uL (ref 3.70–5.45)
RDW: 14 % (ref 11.2–14.5)
RETIC %: 3.22 % — AB (ref 0.70–2.10)
Retic Ct Abs: 119.46 10*3/uL — ABNORMAL HIGH (ref 33.70–90.70)
WBC: 5.2 10*3/uL (ref 3.9–10.3)
lymph#: 1.6 10*3/uL (ref 0.9–3.3)

## 2015-09-17 LAB — IRON AND TIBC CHCC
%SAT: 15 % — AB (ref 21–57)
IRON: 48 ug/dL (ref 41–142)
TIBC: 316 ug/dL (ref 236–444)
UIBC: 269 ug/dL (ref 120–384)

## 2015-09-17 LAB — FERRITIN CHCC: FERRITIN: 54 ng/mL (ref 9–269)

## 2015-09-17 NOTE — Telephone Encounter (Signed)
Patient came in for 11/8 lab today. Moved lab from 11/8 to 11/4.

## 2015-09-19 ENCOUNTER — Other Ambulatory Visit: Payer: Self-pay | Admitting: Hematology

## 2015-09-21 ENCOUNTER — Telehealth: Payer: Self-pay | Admitting: Hematology

## 2015-09-21 ENCOUNTER — Ambulatory Visit: Payer: Medicare HMO

## 2015-09-21 ENCOUNTER — Other Ambulatory Visit: Payer: Medicare HMO

## 2015-09-21 NOTE — Telephone Encounter (Signed)
lvm for pt regarding to 11.18 appt.Marland KitchenMarland KitchenMarland Kitchen

## 2015-09-21 NOTE — Telephone Encounter (Signed)
Returned patients call to move her iron tx

## 2015-09-28 ENCOUNTER — Ambulatory Visit (HOSPITAL_BASED_OUTPATIENT_CLINIC_OR_DEPARTMENT_OTHER): Payer: Medicare HMO

## 2015-09-28 VITALS — BP 117/53 | HR 84 | Temp 98.5°F | Resp 20

## 2015-09-28 DIAGNOSIS — D509 Iron deficiency anemia, unspecified: Secondary | ICD-10-CM | POA: Diagnosis not present

## 2015-09-28 MED ORDER — SODIUM CHLORIDE 0.9 % IV SOLN
510.0000 mg | Freq: Once | INTRAVENOUS | Status: AC
  Administered 2015-09-28: 510 mg via INTRAVENOUS
  Filled 2015-09-28: qty 17

## 2015-09-28 MED ORDER — SODIUM CHLORIDE 0.9 % IV SOLN
Freq: Once | INTRAVENOUS | Status: AC
  Administered 2015-09-28: 15:00:00 via INTRAVENOUS

## 2015-09-28 NOTE — Patient Instructions (Signed)

## 2015-10-01 ENCOUNTER — Ambulatory Visit: Payer: Medicare HMO

## 2015-10-20 ENCOUNTER — Emergency Department (HOSPITAL_COMMUNITY): Payer: Medicare HMO

## 2015-10-20 ENCOUNTER — Encounter (HOSPITAL_COMMUNITY): Payer: Self-pay | Admitting: Emergency Medicine

## 2015-10-20 ENCOUNTER — Emergency Department (HOSPITAL_COMMUNITY)
Admission: EM | Admit: 2015-10-20 | Discharge: 2015-10-20 | Disposition: A | Discharge status: Home / Self Care | Payer: Medicare HMO | Attending: Emergency Medicine | Admitting: Emergency Medicine

## 2015-10-20 DIAGNOSIS — E119 Type 2 diabetes mellitus without complications: Secondary | ICD-10-CM | POA: Diagnosis not present

## 2015-10-20 DIAGNOSIS — D649 Anemia, unspecified: Secondary | ICD-10-CM | POA: Diagnosis not present

## 2015-10-20 DIAGNOSIS — K219 Gastro-esophageal reflux disease without esophagitis: Secondary | ICD-10-CM | POA: Diagnosis not present

## 2015-10-20 DIAGNOSIS — M069 Rheumatoid arthritis, unspecified: Secondary | ICD-10-CM | POA: Diagnosis not present

## 2015-10-20 DIAGNOSIS — Z87891 Personal history of nicotine dependence: Secondary | ICD-10-CM | POA: Diagnosis not present

## 2015-10-20 DIAGNOSIS — I10 Essential (primary) hypertension: Secondary | ICD-10-CM | POA: Diagnosis not present

## 2015-10-20 DIAGNOSIS — Z791 Long term (current) use of non-steroidal anti-inflammatories (NSAID): Secondary | ICD-10-CM | POA: Diagnosis not present

## 2015-10-20 DIAGNOSIS — Z7952 Long term (current) use of systemic steroids: Secondary | ICD-10-CM | POA: Diagnosis not present

## 2015-10-20 DIAGNOSIS — Z7951 Long term (current) use of inhaled steroids: Secondary | ICD-10-CM | POA: Diagnosis not present

## 2015-10-20 DIAGNOSIS — Z79899 Other long term (current) drug therapy: Secondary | ICD-10-CM | POA: Diagnosis not present

## 2015-10-20 DIAGNOSIS — J40 Bronchitis, not specified as acute or chronic: Secondary | ICD-10-CM

## 2015-10-20 DIAGNOSIS — Z880 Allergy status to penicillin: Secondary | ICD-10-CM | POA: Insufficient documentation

## 2015-10-20 DIAGNOSIS — E663 Overweight: Secondary | ICD-10-CM | POA: Insufficient documentation

## 2015-10-20 DIAGNOSIS — J45901 Unspecified asthma with (acute) exacerbation: Secondary | ICD-10-CM | POA: Insufficient documentation

## 2015-10-20 DIAGNOSIS — R0602 Shortness of breath: Secondary | ICD-10-CM | POA: Diagnosis present

## 2015-10-20 DIAGNOSIS — I499 Cardiac arrhythmia, unspecified: Secondary | ICD-10-CM | POA: Insufficient documentation

## 2015-10-20 LAB — CBC WITH DIFFERENTIAL/PLATELET
Basophils Absolute: 0 10*3/uL (ref 0.0–0.1)
Basophils Relative: 0 %
EOS ABS: 0.2 10*3/uL (ref 0.0–0.7)
Eosinophils Relative: 3 %
HEMATOCRIT: 36 % (ref 36.0–46.0)
HEMOGLOBIN: 11.2 g/dL — AB (ref 12.0–15.0)
LYMPHS ABS: 1.2 10*3/uL (ref 0.7–4.0)
LYMPHS PCT: 18 %
MCH: 30.8 pg (ref 26.0–34.0)
MCHC: 31.1 g/dL (ref 30.0–36.0)
MCV: 98.9 fL (ref 78.0–100.0)
MONOS PCT: 7 %
Monocytes Absolute: 0.5 10*3/uL (ref 0.1–1.0)
NEUTROS PCT: 72 %
Neutro Abs: 4.5 10*3/uL (ref 1.7–7.7)
Platelets: 209 10*3/uL (ref 150–400)
RBC: 3.64 MIL/uL — AB (ref 3.87–5.11)
RDW: 14.4 % (ref 11.5–15.5)
WBC: 6.3 10*3/uL (ref 4.0–10.5)

## 2015-10-20 LAB — COMPREHENSIVE METABOLIC PANEL
ALK PHOS: 76 U/L (ref 38–126)
ALT: 32 U/L (ref 14–54)
ANION GAP: 7 (ref 5–15)
AST: 22 U/L (ref 15–41)
Albumin: 3.6 g/dL (ref 3.5–5.0)
BILIRUBIN TOTAL: 0.5 mg/dL (ref 0.3–1.2)
BUN: 18 mg/dL (ref 6–20)
CALCIUM: 9 mg/dL (ref 8.9–10.3)
CO2: 30 mmol/L (ref 22–32)
CREATININE: 1.01 mg/dL — AB (ref 0.44–1.00)
Chloride: 106 mmol/L (ref 101–111)
GFR, EST NON AFRICAN AMERICAN: 56 mL/min — AB (ref >60)
Glucose, Bld: 112 mg/dL — ABNORMAL HIGH (ref 65–99)
Potassium: 3.8 mmol/L (ref 3.5–5.1)
Sodium: 143 mmol/L (ref 135–145)
TOTAL PROTEIN: 6.2 g/dL — AB (ref 6.5–8.1)

## 2015-10-20 MED ORDER — ALBUTEROL (5 MG/ML) CONTINUOUS INHALATION SOLN
10.0000 mg/h | INHALATION_SOLUTION | Freq: Once | RESPIRATORY_TRACT | Status: AC
  Administered 2015-10-20: 10 mg/h via RESPIRATORY_TRACT
  Filled 2015-10-20: qty 20

## 2015-10-20 MED ORDER — PREDNISONE 20 MG PO TABS
60.0000 mg | ORAL_TABLET | Freq: Once | ORAL | Status: AC
  Administered 2015-10-20: 60 mg via ORAL
  Filled 2015-10-20: qty 3

## 2015-10-20 NOTE — Discharge Instructions (Signed)
Use your nebulizer every 3 or 4 hours.

## 2015-10-20 NOTE — ED Notes (Signed)
PT DISCHARGED. INSTRUCTIONS GIVEN. AAOX3. PT IN NO APPARENT DISTRESS OR PAIN. THE OPPORTUNITY TO ASK QUESTIONS WAS PROVIDED. 

## 2015-10-20 NOTE — ED Notes (Addendum)
Pt states that she went to see family at Thanksgiving and was admitted to hospital in [place] for 6 days for PNA.  Pt states that she isnt feeling any better and doesn't think the meds are helping but she c/o shob with exertion and "heart skipping a beat". Pt has home O2 that she uses PRN. Pt came in on 2L O2 via [place]. Pt 95% on room air.

## 2015-10-20 NOTE — ED Notes (Signed)
INITIAL ASSESSMENT COMPLETED. PT C/O SOB AND A NONPRODUCTIVE COUGH X2 WEEKS. PT STATES SHE WAS HOSPITALIZED IN [place] FOR PNEUMONIA X6 DAYS, BUT SHE SHE DOES NOT FEEL ANY BETTER. AWAITING FURTHER ORDERS.

## 2015-10-20 NOTE — ED Provider Notes (Signed)
CSN: [id number]     Arrival date & time 10/20/15  1010 History   First MD Initiated Contact with Patient 10/20/15 1201     Chief Complaint  Patient presents with  . Irregular Heart Beat  . Shortness of Breath     (Consider location/radiation/quality/duration/timing/severity/associated sxs/prior Treatment) HPI   Anita Gonzalez is a 69 y.o. female who presents for evaluation of shortness of breath, dyspnea on exertion and skipping heartbeat. Symptoms progressive over the last several days. She was hospitalized in [place] about 2 weeks ago for 6 days and treated at that time for pneumonia with Levaquin and a prednisone taper. She has been using her albuterol nebulizer, last time at 3 AM today. She denies fever, chills, nausea or vomiting. There are no other known modifying factors.   Past Medical History  Diagnosis Date  . Diabetes mellitus without complication (HCC)   . Asthma   . Arthritis   . Hypertension   . Rheumatoid arthritis (HCC) 1996  . GERD (gastroesophageal reflux disease)   . Anemia   . Hay fever    Past Surgical History  Procedure Laterality Date  . Cesarean section    . Hernia repair    . Hemorrhoid surgery    . Cesarean section    . Hernia repair    . Abdominal hysterectomy    . Esophagogastroduodenoscopy (egd) with propofol N/A 04/02/2015    Procedure: ESOPHAGOGASTRODUODENOSCOPY (EGD) WITH PROPOFOL;  Surgeon: Jeani Hawking, MD;  Location: [facility];  Service: Endoscopy;  Laterality: N/A;  . Colonoscopy with propofol N/A 04/02/2015    Procedure: COLONOSCOPY WITH PROPOFOL;  Surgeon: Jeani Hawking, MD;  Location: [facility];  Service: Endoscopy;  Laterality: N/A;   Family History  Problem Relation Age of Onset  . Diabetes Mother   . Heart failure Mother   . Throat cancer Brother     throat cancer    Social History  Substance Use Topics  . Smoking status: Former Smoker -- 0.75 packs/day for 25 years    Types: Cigarettes    Quit date: 01/12/2011  .  Smokeless tobacco: Never Used  . Alcohol Use: 2.4 oz/week    4 Cans of beer per week     Comment: occ   OB History    No data available     Review of Systems  All other systems reviewed and are negative.     Allergies  Fish allergy; Peanuts; Hydrocodone; Iodinated diagnostic agents; Other; Oxycodone; Penicillins; and Percocet  Home Medications   Prior to Admission medications   Medication Sig Start Date End Date Taking? Authorizing Provider  albuterol (PROVENTIL HFA;VENTOLIN HFA) 108 (90 BASE) MCG/ACT inhaler Inhale 2 puffs into the lungs every 6 (six) hours as needed for wheezing or shortness of breath (wheezing).    Yes Historical Provider, MD  bumetanide (BUMEX) 2 MG tablet Take 2 mg by mouth 2 (two) times a week. Monday and Thursday.   Yes Historical Provider, MD  clobetasol (TEMOVATE) 0.05 % GEL Apply 1 application topically 2 (two) times daily. 12/21/14  Yes Hannah Muthersbaugh, PA-C  cyanocobalamin 1000 MCG tablet Take 100 mcg by mouth once a week. Monday   Yes Historical Provider, MD  cyclobenzaprine (FLEXERIL) 10 MG tablet Take 10 mg by mouth daily.    Yes Historical Provider, MD  ergocalciferol (VITAMIN D2) 50000 UNITS capsule Take 50,000 Units by mouth once a week. Thursday.   Yes Historical Provider, MD  ferrous sulfate 325 (65 FE) MG tablet Take 325 mg by mouth  2 (two) times daily with a meal.    Yes Historical Provider, MD  fluticasone (FLONASE) 50 MCG/ACT nasal spray Place 1 spray into both nostrils daily as needed for allergies or rhinitis.   Yes Historical Provider, MD  Fluticasone-Salmeterol (ADVAIR) 500-50 MCG/DOSE AEPB Inhale 2 puffs into the lungs daily. Patient taking differently: Inhale 2 puffs into the lungs 2 (two) times daily.  01/11/15  Yes Nishant Dhungel, MD  folic acid (FOLVITE) 1 MG tablet Take 1 mg by mouth daily.   Yes Historical Provider, MD  guaiFENesin (MUCINEX) 600 MG 12 hr tablet Take 600 mg by mouth 2 (two) times daily. 10/08/15  Yes Historical  Provider, MD  ipratropium-albuterol (DUONEB) 0.5-2.5 (3) MG/3ML SOLN Inhale 3 mLs into the lungs every 6 (six) hours. 10/08/15  Yes Historical Provider, MD  lidocaine (XYLOCAINE) 5 % ointment Apply 1 application topically 3 (three) times daily as needed (joint pain).   Yes Historical Provider, MD  loperamide (IMODIUM A-D) 2 MG tablet Take 2 mg by mouth 4 (four) times daily as needed for diarrhea or loose stools.   Yes Historical Provider, MD  meloxicam (MOBIC) 7.5 MG tablet Take 7.5 mg by mouth daily.   Yes Historical Provider, MD  metFORMIN (GLUCOPHAGE) 500 MG tablet Take 500 mg by mouth 2 (two) times daily. 05/05/15  Yes Historical Provider, MD  metoprolol tartrate (LOPRESSOR) 25 MG tablet Take 1 tablet (25 mg total) by mouth 2 (two) times daily. Patient taking differently: Take 25 mg by mouth 3 (three) times daily.  01/11/15  Yes Nishant Dhungel, MD  montelukast (SINGULAIR) 10 MG tablet Take 10 mg by mouth at bedtime.   Yes Historical Provider, MD  Multiple Vitamin (MULTIVITAMIN) tablet Take 1 tablet by mouth daily. With iron.   Yes Historical Provider, MD  omeprazole (PRILOSEC) 20 MG capsule Take 20 mg by mouth daily.   Yes Historical Provider, MD  potassium chloride (K-DUR,KLOR-CON) 10 MEQ tablet Take 10 mEq by mouth every other day.    Yes Historical Provider, MD  senna (SENOKOT) 8.6 MG TABS tablet Take 8.6 mg by mouth 2 (two) times daily as needed for mild constipation.    Yes Historical Provider, MD  simvastatin (ZOCOR) 20 MG tablet Take 20 mg by mouth every evening.    Yes Historical Provider, MD  sodium chloride HYPERTONIC 3 % nebulizer solution Take 3 mLs by nebulization every 6 (six) hours. 10/11/15  Yes Historical Provider, MD  verapamil (VERELAN PM) 360 MG 24 hr capsule Take 1 capsule (360 mg total) by mouth daily. 01/11/15  Yes Nishant Dhungel, MD   BP 126/62 mmHg  Pulse 86  Temp(Src) 98.4 F (36.9 C) (Oral)  Resp 21  SpO2 98% Physical Exam  Constitutional: She is oriented to  person, place, and time. She appears well-developed.  Overweight  HENT:  Head: Normocephalic and atraumatic.  Right Ear: External ear normal.  Left Ear: External ear normal.  Eyes: Conjunctivae and EOM are normal. Pupils are equal, round, and reactive to light.  Neck: Normal range of motion and phonation normal. Neck supple.  Cardiovascular: Normal rate, regular rhythm and normal heart sounds.   Pulmonary/Chest: Effort normal. No respiratory distress. She exhibits no bony tenderness.  Decrease her lumbar spine with scattered rhonchi, no rales or wheezes.  Abdominal: Soft. There is no tenderness.  Musculoskeletal: Normal range of motion. She exhibits no edema or tenderness.  Neurological: She is alert and oriented to person, place, and time. No cranial nerve deficit or sensory deficit. She  exhibits normal muscle tone. Coordination normal.  Skin: Skin is warm, dry and intact.  Psychiatric: She has a normal mood and affect. Her behavior is normal. Judgment and thought content normal.  Nursing note and vitals reviewed.   ED Course  Procedures (including critical care time)  Medications  albuterol (PROVENTIL,VENTOLIN) solution continuous neb (10 mg/hr Nebulization Given 10/20/15 1231)  predniSONE (DELTASONE) tablet 60 mg (60 mg Oral Given 10/20/15 1245)    Patient Vitals for the past 24 hrs:  BP Temp Temp src Pulse Resp SpO2  10/20/15 1232 - - - - - 98 %  10/20/15 1230 126/62 mmHg - - 86 21 100 %  10/20/15 1027 137/86 mmHg 98.4 F (36.9 C) Oral 87 21 95 %    1:58 PM Reevaluation with update and discussion. After initial assessment and treatment, an updated evaluation reveals she feels better, at baseline now. Lungs have improved air movement bilaterally without audible wheezes, Rales or rhonchi. Findings discussed with patient and daughter, all questions were answered. Markeese Boyajian L     Labs Review Labs Reviewed  CBC WITH DIFFERENTIAL/PLATELET - Abnormal; Notable for the  following:    RBC 3.64 (*)    Hemoglobin 11.2 (*)    All other components within normal limits  COMPREHENSIVE METABOLIC PANEL - Abnormal; Notable for the following:    Glucose, Bld 112 (*)    Creatinine, Ser 1.01 (*)    Total Protein 6.2 (*)    GFR calc non Af Amer 56 (*)    All other components within normal limits    Imaging Review Dg Chest 2 View  10/20/2015  CLINICAL DATA:  Shortness of breath EXAM: CHEST  2 VIEW COMPARISON:  04/30/2015 FINDINGS: Normal heart size and mediastinal contours. Hypoventilation with chronic interstitial coarsening and crowding. These findings were also seen on chest CT 04/13/2015. No effusion or pneumothorax. Diffuse spondylosis with thoracic dextroscoliosis. IMPRESSION: Chronic lung disease and hypoventilation. No acute finding or definite change since 04/30/2015. Electronically Signed   By: Marnee Spring M.D.   On: 10/20/2015 11:39   I have personally reviewed and evaluated these images and lab results as part of my medical decision-making.   EKG Interpretation   Date/Time:  Wednesday October 20 2015 10:25:34 EST Ventricular Rate:  90 PR Interval:  170 QRS Duration: 119 QT Interval:  381 QTC Calculation: 466 R Axis:   22 Text Interpretation:  Sinus rhythm Incomplete RBBB and LAFB Low voltage,  precordial leads Since last tracing rate slower Confirmed by Zakyah Yanes  MD,  Lyndsi Altic ([id number]) on 10/20/2015 12:02:17 PM      MDM   Final diagnoses:  Bronchitis    Evaluation is consistent with post pneumonia, bronchitis. She recently completed a prolonged course of prednisone, and has a reassuring evaluation. Doubt pneumonia, sepsis, ACS or PE.  Nursing Notes Reviewed/ Care Coordinated Applicable Imaging Reviewed Interpretation of Laboratory Data incorporated into ED treatment  The patient appears reasonably screened and/or stabilized for discharge and I doubt any other medical condition or other [facility] requiring further screening, evaluation, or  treatment in the ED at this time prior to discharge.  Plan: Home Medications- increase frequency of nebulizer to every 3 hours when necessary; Home Treatments- rest; return here if the recommended treatment, does not improve the symptoms; Recommended follow up- PCP, tomorrow as scheduled     Mancel Bale, MD 10/20/15 1359

## 2015-10-29 ENCOUNTER — Ambulatory Visit: Payer: Medicare HMO | Admitting: Internal Medicine

## 2015-10-29 ENCOUNTER — Telehealth (HOSPITAL_COMMUNITY): Payer: Self-pay

## 2015-10-29 NOTE — Telephone Encounter (Signed)
Spoke with patient in regards to Pulmonary Rehab. Patient is interested in attending. Sees Dr. Maple Hudson 11/01/15. Will ask for referral if he thinks patient is appropriate.

## 2015-11-01 ENCOUNTER — Ambulatory Visit (INDEPENDENT_AMBULATORY_CARE_PROVIDER_SITE_OTHER): Payer: Medicare HMO | Admitting: Internal Medicine

## 2015-11-01 ENCOUNTER — Encounter: Payer: Self-pay | Admitting: Internal Medicine

## 2015-11-01 VITALS — BP 142/92 | HR 85 | Ht 61.0 in | Wt 222.4 lb

## 2015-11-01 DIAGNOSIS — J9601 Acute respiratory failure with hypoxia: Secondary | ICD-10-CM | POA: Diagnosis not present

## 2015-11-01 DIAGNOSIS — J45901 Unspecified asthma with (acute) exacerbation: Secondary | ICD-10-CM | POA: Diagnosis not present

## 2015-11-01 MED ORDER — DOXYCYCLINE HYCLATE 100 MG PO TABS: ORAL_TABLET | ORAL | Status: DC

## 2015-11-01 MED ORDER — LEVALBUTEROL HCL 0.63 MG/3ML IN NEBU
0.6300 mg | INHALATION_SOLUTION | Freq: Once | RESPIRATORY_TRACT | Status: AC
  Administered 2015-11-01: 0.63 mg via RESPIRATORY_TRACT

## 2015-11-01 NOTE — Patient Instructions (Addendum)
Script sent to [facility] for dxycycline antibiotic  Neb xop 0.63  Keep appointments as planned

## 2015-11-01 NOTE — Progress Notes (Signed)
HPI  PCP Anita Gonzalez,GEORGE, MD  HPI  IOV 04/06/2015  Chief Complaint  Patient presents with  . Pulmonary Consult    Pt referred by Raelyn Number, PA for O2 dependence.    69 year old female moved from [place] to [place] in 2016. Has a history of type 2 diabetes, hypertension, hyperlipidemia, asthma not otherwise specified, rheumatoid arthritis, former smoker and significant obesity  She started good historian. The history is minimal elevated from review of the outside records from 02/03/2015 and review of [facility] medical records from February 2016. And in talking to the patient.  At baseline she is living in [place], [place]. She carries a long-standing diagnosis of asthma for which she takes possibly Advair on a non-compliant basis and albuterol as needed. Because of this she has chronic cough and shortness of breath with exertion and relieved by rest. She moved into December 2015 to [place], [place] to be close to her daughter. Then around 01/06/2015 she developed acute worsening of cough with mucus production and was hospitalized under the tried [facility] for 5 days. Chest x-ray to admission documented below showed right lower lobe consolidation. She was treated as community acquired pneumonia Since then she's been placed on oxygen at discharge. Prior to that she was never on oxygen. She is unaware if she was having exertional hypoxemia prior to this admission. Currently she feels that the pneumonia has resolved and she is back to her baseline health. In the office today when she exerted she did desaturate to 60%. However she does not feel this.   In terms of her past medical history - Smoking: 18.5 prior smoking history  - RA: REports RA NOS - not able to give me details. Not on immune modulator. Not seen rheumatlogist in [place] - ASthma: as above. LAst admit 2 years ago due to aeasthma - ILD - denies =-OSA: denies testing or diagnosis but appears  to be high risk for it  Chest x-ray from 01/06/2015 personally visualized image shows right lower lobe consolidation. Agree with the formal interpretation  Outside lab tests from primary care physician reviewed dated 02/03/2015 ESR 34, ANA negative, rheumatoid factor normal, normal TSH creatinine 0.9 mg percent. Abnormal values include hemoglobin of 9 g percent [in the hospital hemoglobin fluctuated between 7.5 g percent and 8.4 g percent. Most recent hemoglobin 03/26/2015 shows improvement of 10.6 g percent.  has a past medical history of Diabetes mellitus without complication; Asthma; Arthritis; Hypertension; Rheumatoid arthritis (1996); GERD (gastroesophageal reflux disease); Anemia; and Hay fever.  reports that she quit smoking about 4 years ago. Her smoking use included Cigarettes. She has a 18.75 pack-year smoking history. She has never used smokeless tobacco.   04/23/2015 Follow up  Patient returns for two-week follow-up She was seen for new pulmonary consult for dyspnea and hypoxia. Patient was admitted in February for pneumonia and treated with anabiotic's. She was placed on oxygen at discharge due to persistent desaturations. She is a former smoker. Patient was set up for a CT chest that showed mild diffuse bronchiectasis, very mild groundglass attenuation. Atherosclerosis. No evidence of interstitial lung disease. PFT shows restriction with a low diffusing capacity. FEV1 was 1.20 L, 72%, ratio 88, positive broncho-dilator response. Diffusing capacity 35% (PFTdid show patient had difficulty with DLCO component) 2-D echo showed an EF of 60-65%,, grade 1 diastolic dysfunction. Patient says she was seen in [place] by cardiologist but has not establish here in [place] . Patient is currently on oxygen 2 L with activity and at  bedtime. Has been told that she snores and does have some daytime sleepiness. Last visit. Patient had desaturations with ambulation.  REC Sleep  study [facility] Cards referral   OV 07/20/2015  Chief Complaint         11/01/2015 acute OV 69 year old female former smoker-MR pt. Follows for: Dyspnea. Pt c/o recent Pna diagnosis 11/16. Pt states that she is still sick with cough with yellow/green mucus, SOB, wheeze and tightness. Pt states that her dyspnea is still limiting her. Pt is on 2L pulsed while ambulating and sleeping.  O2 2 L/Sleep/[facility] ER visit 10/20/2015-presenting with dyspnea and palpitations. Reference made to hospitalization in [place] 2 weeks previously for 6 days, treated with Levaquin and prednisone for pneumonia. Workup was negative for DVT and positive for right lower lobe pneumonia area Still coughing minimally productive. Night sweats. O2 2 L/ CXR 10/20/2015-my review-prominent markings consistent with bronchitis but marked hypoventilation consistent with abdominal obesity. Low lung volumes contribute to appearance of prominent markings. IMPRESSION: Chronic lung disease and hypoventilation. No acute finding or definite change since 04/30/2015. Electronically Signed  By: Monte Fantasia M.D.  On: 10/20/2015 11:39  ROS-see HPI   Negative unless "+" Constitutional:    weight loss, + night sweats, fevers, chills, fatigue, lassitude. HEENT:    headaches, difficulty swallowing, tooth/dental problems, sore throat,       sneezing, itching, ear ache, nasal congestion, post nasal drip, snoring CV:    chest pain, orthopnea, PND, swelling in lower extremities, anasarca,                                                      dizziness, palpitations Resp:   + shortness of breath with exertion or at rest.                productive cough,   + non-productive cough, coughing up of blood.              change in color of mucus.  wheezing.   Skin:    rash or lesions. GI:  No-   heartburn, indigestion, abdominal pain, nausea, vomiting, GU:  MS:   joint pain, stiffness, decreased range of motion, back pain. Neuro-     nothing  unusual Psych:  change in mood or affect.  depression or anxiety.   memory loss.  OBJ- Physical Exam General- Alert, Oriented, Affect-appropriate, Distress- none acute, + morbid obesity Skin- rash-none, lesions- none, excoriation- none Lymphadenopathy- none Head- atraumatic            Eyes- Gross vision intact, PERRLA, conjunctivae and secretions clear            Ears- Hearing, canals-normal            Nose- Clear, no-Septal dev, mucus, polyps, erosion, perforation             Throat- Mallampati II , mucosa clear , drainage- none, tonsils- atrophic Neck- flexible , trachea midline, no stridor , thyroid nl, carotid no bruit Chest - symmetrical excursion , unlabored           Heart/CV- RRR occasional extra beat , no murmur , no gallop  , no rub, nl s1 s2                           - JVD-  none , edema- none, stasis changes- none, varices- none           Lung- + shallow consistent with body habitus, clear to P&A, wheeze + bilateral expiratory, cough- none , dullness-none, rub- none           Chest wall-  Abd-  Br/ Gen/ Rectal- Not done, not indicated Extrem- cyanosis- none, clubbing, none, atrophy- none, strength- nl Neuro- grossly intact to observation

## 2015-11-01 NOTE — Assessment & Plan Note (Addendum)
Moderate persistent asthmatic bronchitis, uncontrolled. Obesity hypoventilation indicated by body habitus and chest x-ray causing significant limitation of lung expansion Plan-doxycycline, nebulizer treatments Xopenex

## 2015-11-01 NOTE — Assessment & Plan Note (Signed)
Remains oxygen dependent especially for sleep

## 2015-11-03 ENCOUNTER — Encounter (HOSPITAL_COMMUNITY): Payer: Self-pay | Admitting: Vascular Surgery

## 2015-11-03 ENCOUNTER — Emergency Department (HOSPITAL_COMMUNITY)
Admission: EM | Admit: 2015-11-03 | Discharge: 2015-11-03 | Disposition: A | Discharge status: Home / Self Care | Payer: Medicare HMO | Attending: Emergency Medicine | Admitting: Emergency Medicine

## 2015-11-03 ENCOUNTER — Emergency Department (HOSPITAL_COMMUNITY): Payer: Medicare HMO

## 2015-11-03 DIAGNOSIS — R55 Syncope and collapse: Secondary | ICD-10-CM | POA: Diagnosis present

## 2015-11-03 DIAGNOSIS — R062 Wheezing: Secondary | ICD-10-CM

## 2015-11-03 DIAGNOSIS — J45901 Unspecified asthma with (acute) exacerbation: Secondary | ICD-10-CM | POA: Diagnosis not present

## 2015-11-03 DIAGNOSIS — Z791 Long term (current) use of non-steroidal anti-inflammatories (NSAID): Secondary | ICD-10-CM | POA: Insufficient documentation

## 2015-11-03 DIAGNOSIS — I10 Essential (primary) hypertension: Secondary | ICD-10-CM | POA: Insufficient documentation

## 2015-11-03 DIAGNOSIS — E119 Type 2 diabetes mellitus without complications: Secondary | ICD-10-CM | POA: Insufficient documentation

## 2015-11-03 DIAGNOSIS — Z880 Allergy status to penicillin: Secondary | ICD-10-CM | POA: Insufficient documentation

## 2015-11-03 DIAGNOSIS — K219 Gastro-esophageal reflux disease without esophagitis: Secondary | ICD-10-CM | POA: Diagnosis not present

## 2015-11-03 DIAGNOSIS — D649 Anemia, unspecified: Secondary | ICD-10-CM | POA: Diagnosis not present

## 2015-11-03 DIAGNOSIS — M1990 Unspecified osteoarthritis, unspecified site: Secondary | ICD-10-CM | POA: Insufficient documentation

## 2015-11-03 DIAGNOSIS — Z79899 Other long term (current) drug therapy: Secondary | ICD-10-CM | POA: Insufficient documentation

## 2015-11-03 DIAGNOSIS — Z87891 Personal history of nicotine dependence: Secondary | ICD-10-CM | POA: Diagnosis not present

## 2015-11-03 HISTORY — DX: Bronchitis, not specified as acute or chronic: J40

## 2015-11-03 LAB — BASIC METABOLIC PANEL
Anion gap: 9 (ref 5–15)
BUN: 9 mg/dL (ref 6–20)
CHLORIDE: 102 mmol/L (ref 101–111)
CO2: 32 mmol/L (ref 22–32)
Calcium: 9.7 mg/dL (ref 8.9–10.3)
Creatinine, Ser: 1.06 mg/dL — ABNORMAL HIGH (ref 0.44–1.00)
GFR calc Af Amer: >60 mL/min (ref >60)
GFR, EST NON AFRICAN AMERICAN: 53 mL/min — AB (ref >60)
Glucose, Bld: 102 mg/dL — ABNORMAL HIGH (ref 65–99)
Potassium: 4.7 mmol/L (ref 3.5–5.1)
Sodium: 143 mmol/L (ref 135–145)

## 2015-11-03 LAB — URINALYSIS, ROUTINE W REFLEX MICROSCOPIC
Bilirubin Urine: NEGATIVE
Glucose, UA: NEGATIVE mg/dL
Hgb urine dipstick: NEGATIVE
KETONES UR: NEGATIVE mg/dL
LEUKOCYTES UA: NEGATIVE
NITRITE: NEGATIVE
PH: 7.5 (ref 5.0–8.0)
Protein, ur: NEGATIVE mg/dL
Specific Gravity, Urine: 1.01 (ref 1.005–1.030)

## 2015-11-03 LAB — CBC
HEMATOCRIT: 38.1 % (ref 36.0–46.0)
HEMOGLOBIN: 12 g/dL (ref 12.0–15.0)
MCH: 30.8 pg (ref 26.0–34.0)
MCHC: 31.5 g/dL (ref 30.0–36.0)
MCV: 97.9 fL (ref 78.0–100.0)
Platelets: 204 10*3/uL (ref 150–400)
RBC: 3.89 MIL/uL (ref 3.87–5.11)
RDW: 13.8 % (ref 11.5–15.5)
WBC: 4.7 10*3/uL (ref 4.0–10.5)

## 2015-11-03 LAB — TROPONIN I: Troponin I: <0.03 ng/mL (ref <0.031)

## 2015-11-03 MED ORDER — ALBUTEROL SULFATE (2.5 MG/3ML) 0.083% IN NEBU
2.5000 mg | INHALATION_SOLUTION | Freq: Once | RESPIRATORY_TRACT | Status: AC
  Administered 2015-11-03: 2.5 mg via RESPIRATORY_TRACT

## 2015-11-03 MED ORDER — IPRATROPIUM-ALBUTEROL 0.5-2.5 (3) MG/3ML IN SOLN
3.0000 mL | Freq: Once | RESPIRATORY_TRACT | Status: AC
  Administered 2015-11-03: 3 mL via RESPIRATORY_TRACT
  Filled 2015-11-03: qty 6

## 2015-11-03 MED ORDER — METHYLPREDNISOLONE SODIUM SUCC 125 MG IJ SOLR
125.0000 mg | Freq: Once | INTRAMUSCULAR | Status: AC
  Administered 2015-11-03: 125 mg via INTRAVENOUS
  Filled 2015-11-03: qty 2

## 2015-11-03 MED ORDER — IPRATROPIUM-ALBUTEROL 0.5-2.5 (3) MG/3ML IN SOLN
3.0000 mL | Freq: Once | RESPIRATORY_TRACT | Status: AC
  Administered 2015-11-03: 3 mL via RESPIRATORY_TRACT
  Filled 2015-11-03: qty 3

## 2015-11-03 MED ORDER — ALBUTEROL SULFATE (2.5 MG/3ML) 0.083% IN NEBU
2.5000 mg | INHALATION_SOLUTION | Freq: Once | RESPIRATORY_TRACT | Status: AC
  Administered 2015-11-03: 2.5 mg via RESPIRATORY_TRACT
  Filled 2015-11-03: qty 3

## 2015-11-03 MED ORDER — SODIUM CHLORIDE 0.9 % IV BOLUS (SEPSIS)
1000.0000 mL | Freq: Once | INTRAVENOUS | Status: AC
  Administered 2015-11-03: 1000 mL via INTRAVENOUS

## 2015-11-03 NOTE — ED Notes (Signed)
MD speaking with pt.  Pt states she continues to be sob despite breathing tx.

## 2015-11-03 NOTE — ED Provider Notes (Signed)
CSN: [id number]     Arrival date & time 11/03/15  1327 History   First MD Initiated Contact with Patient 11/03/15 1340     Chief Complaint  Patient presents with  . Fall  . Near Syncope     (Consider location/radiation/quality/duration/timing/severity/associated sxs/prior Treatment) HPI   Anita Gonzalez is a 69 y.o. female, with a history of DM, asthma, anemia, and recurring pneumonia, presenting to the ED with lightheadedness since this morning along with an episode of near syncope. Pt stood up from her bed this morning, got lightheaded and off balance and fell to her knees. Pt was able to catch herself against the wall and did not hit her head. Pt states she has had shortness of breath for about the past month due to pneumonia and then bronchitis, but no more today than she normally has. Pt adds that she uses PRN 2 lpm O2 at home due to recurring pneumonia. Pt denies N/V/C/D, fever/chills, visual disturbances, chest pain, abdominal pain, or any other complaints. Pt denies anticoagulation therapy.    Past Medical History  Diagnosis Date  . Diabetes mellitus without complication (HCC)   . Asthma   . Arthritis   . Hypertension   . Rheumatoid arthritis (HCC) 1996  . GERD (gastroesophageal reflux disease)   . Anemia   . Hay fever   . Bronchitis    Past Surgical History  Procedure Laterality Date  . Cesarean section    . Hernia repair    . Hemorrhoid surgery    . Cesarean section    . Hernia repair    . Abdominal hysterectomy    . Esophagogastroduodenoscopy (egd) with propofol N/A 04/02/2015    Procedure: ESOPHAGOGASTRODUODENOSCOPY (EGD) WITH PROPOFOL;  Surgeon: Jeani Hawking, MD;  Location: [facility];  Service: Endoscopy;  Laterality: N/A;  . Colonoscopy with propofol N/A 04/02/2015    Procedure: COLONOSCOPY WITH PROPOFOL;  Surgeon: Jeani Hawking, MD;  Location: [facility];  Service: Endoscopy;  Laterality: N/A;   Family History  Problem Relation Age of Onset  . Diabetes  Mother   . Heart failure Mother   . Throat cancer Brother     throat cancer    Social History  Substance Use Topics  . Smoking status: Former Smoker -- 0.75 packs/day for 25 years    Types: Cigarettes    Quit date: 01/12/2011  . Smokeless tobacco: Never Used  . Alcohol Use: 2.4 oz/week    4 Cans of beer per week     Comment: occ   OB History    No data available     Review of Systems  Constitutional: Negative for fever and chills.  Respiratory: Positive for shortness of breath and wheezing. Negative for chest tightness.   Cardiovascular: Negative for chest pain and palpitations.  Gastrointestinal: Negative for abdominal pain.  Genitourinary: Negative for dysuria and hematuria.  Skin: Negative for color change and pallor.  Neurological: Positive for light-headedness. Negative for headaches.       Near syncope  All other systems reviewed and are negative.     Allergies  Fish allergy; Peanuts; Hydrocodone; Iodinated diagnostic agents; Other; Oxycodone; Penicillins; and Percocet  Home Medications   Prior to Admission medications   Medication Sig Start Date End Date Taking? Authorizing Provider  albuterol (PROVENTIL HFA;VENTOLIN HFA) 108 (90 BASE) MCG/ACT inhaler Inhale 2 puffs into the lungs every 6 (six) hours as needed for wheezing or shortness of breath (wheezing).    Yes Historical Provider, MD  bumetanide (BUMEX) 2 MG  tablet Take 2 mg by mouth 2 (two) times a week. Monday and Thursday.   Yes Historical Provider, MD  clobetasol (TEMOVATE) 0.05 % GEL Apply 1 application topically 2 (two) times daily. 12/21/14  Yes Hannah Muthersbaugh, PA-C  cyanocobalamin 100 MCG tablet Take 100 mcg by mouth every 7 (seven) days.   Yes Historical Provider, MD  cyclobenzaprine (FLEXERIL) 10 MG tablet Take 10 mg by mouth daily.    Yes Historical Provider, MD  doxycycline (VIBRA-TABS) 100 MG tablet 2 today then one daily Patient taking differently: Take 100 mg by mouth daily as needed. For  diabetic nerve pain 11/01/15  Yes Waymon Budge, MD  ergocalciferol (VITAMIN D2) 50000 UNITS capsule Take 50,000 Units by mouth once a week. Thursday.   Yes Historical Provider, MD  ferrous sulfate 325 (65 FE) MG tablet Take 325 mg by mouth 2 (two) times daily with a meal.    Yes Historical Provider, MD  fluticasone (FLONASE) 50 MCG/ACT nasal spray Place 1 spray into both nostrils daily as needed for allergies or rhinitis.   Yes Historical Provider, MD  Fluticasone-Salmeterol (ADVAIR) 500-50 MCG/DOSE AEPB Inhale 2 puffs into the lungs daily. Patient taking differently: Inhale 2 puffs into the lungs 2 (two) times daily.  01/11/15  Yes Nishant Dhungel, MD  folic acid (FOLVITE) 1 MG tablet Take 1 mg by mouth daily.   Yes Historical Provider, MD  guaiFENesin (MUCINEX) 600 MG 12 hr tablet Take 600 mg by mouth 2 (two) times daily. Reported on 11/01/2015 10/08/15  Yes Historical Provider, MD  ipratropium-albuterol (DUONEB) 0.5-2.5 (3) MG/3ML SOLN Inhale 3 mLs into the lungs every 6 (six) hours. 10/08/15  Yes Historical Provider, MD  lidocaine (XYLOCAINE) 5 % ointment Apply 1 application topically 3 (three) times daily as needed (joint pain).   Yes Historical Provider, MD  meloxicam (MOBIC) 7.5 MG tablet Take 7.5 mg by mouth daily.   Yes Historical Provider, MD  metFORMIN (GLUCOPHAGE) 500 MG tablet Take 500 mg by mouth 2 (two) times daily. 05/05/15  Yes Historical Provider, MD  metoprolol tartrate (LOPRESSOR) 25 MG tablet Take 1 tablet (25 mg total) by mouth 2 (two) times daily. Patient taking differently: Take 25 mg by mouth 3 (three) times daily.  01/11/15  Yes Nishant Dhungel, MD  montelukast (SINGULAIR) 10 MG tablet Take 10 mg by mouth at bedtime.   Yes Historical Provider, MD  Multiple Vitamin (MULTIVITAMIN) tablet Take 1 tablet by mouth daily. With iron.   Yes Historical Provider, MD  omeprazole (PRILOSEC) 20 MG capsule Take 20 mg by mouth daily.   Yes Historical Provider, MD  potassium chloride  (K-DUR,KLOR-CON) 10 MEQ tablet Take 10 mEq by mouth every other day.    Yes Historical Provider, MD  senna (SENOKOT) 8.6 MG TABS tablet Take 8.6 mg by mouth 2 (two) times daily as needed for mild constipation.    Yes Historical Provider, MD  simvastatin (ZOCOR) 20 MG tablet Take 20 mg by mouth every evening.    Yes Historical Provider, MD  valsartan (DIOVAN) 80 MG tablet Take 1 tablet by mouth daily. 10/25/15  Yes Historical Provider, MD  verapamil (VERELAN PM) 360 MG 24 hr capsule Take 1 capsule (360 mg total) by mouth daily. 01/11/15  Yes Nishant Dhungel, MD  cyanocobalamin 1000 MCG tablet Take 100 mcg by mouth once a week. Monday    Historical Provider, MD  loperamide (IMODIUM A-D) 2 MG tablet Take 2 mg by mouth 4 (four) times daily as needed for diarrhea or  loose stools.    Historical Provider, MD  sodium chloride HYPERTONIC 3 % nebulizer solution Take 3 mLs by nebulization every 6 (six) hours. 10/11/15   Historical Provider, MD   BP 165/67 mmHg  Pulse 78  Temp(Src) 98.4 F (36.9 C) (Oral)  Resp 24  Ht 5\' 1"  (1.549 m)  Wt 100.699 kg  BMI 41.97 kg/m2  SpO2 93% Physical Exam  Constitutional: She is oriented to person, place, and time. She appears well-developed and well-nourished. No distress.  HENT:  Head: Normocephalic and atraumatic.  Eyes: Conjunctivae are normal. Pupils are equal, round, and reactive to light.  Neck: Normal range of motion. Neck supple.  Cardiovascular: Normal rate, regular rhythm, normal heart sounds and intact distal pulses.   Pulmonary/Chest: Effort normal. No accessory muscle usage. Tachypnea noted. No respiratory distress. She has wheezes in the right upper field, the right middle field, the right lower field, the left upper field, the left middle field and the left lower field.  Abdominal: Soft. Bowel sounds are normal.  Musculoskeletal: She exhibits no edema or tenderness.  Full ROM in all extremities and spine. No paraspinal tenderness.   Lymphadenopathy:     She has no cervical adenopathy.  Neurological: She is alert and oriented to person, place, and time. She has normal reflexes.  No sensory deficits. Strength 5/5 in all extremities. No gait disturbance. Cranial nerves III-XII grossly intact. No facial droop.   Skin: Skin is warm and dry. She is not diaphoretic.  Nursing note and vitals reviewed.   ED Course  Procedures (including critical care time) Labs Review Labs Reviewed  BASIC METABOLIC PANEL - Abnormal; Notable for the following:    Glucose, Bld 102 (*)    Creatinine, Ser 1.06 (*)    GFR calc non Af Amer 53 (*)    All other components within normal limits  CBC  URINALYSIS, ROUTINE W REFLEX MICROSCOPIC (NOT AT [facility])  TROPONIN I    Imaging Review Dg Chest 2 View  11/03/2015  CLINICAL DATA:  Dizziness, shortness of breath, cough EXAM: CHEST  2 VIEW COMPARISON:  10/20/2015 FINDINGS: Low lung volumes with bibasilar atelectasis. Heart is normal size. No effusions. No acute bony abnormality. IMPRESSION: Low lung volumes with bibasilar atelectasis. Electronically Signed   By: 14/05/2015 M.D.   On: 11/03/2015 14:31   I have personally reviewed and evaluated these images and lab results as part of my medical decision-making.   EKG Interpretation   Date/Time:  Wednesday November 03 2015 13:35:32 EST Ventricular Rate:  67 PR Interval:  172 QRS Duration: 121 QT Interval:  458 QTC Calculation: 483 R Axis:   20 Text Interpretation:  Sinus rhythm Right bundle branch block No  significant change since last tracing Confirmed by [facility] MD, ERIN  ([facility]) on 11/03/2015 2:20:58 PM      MDM   Final diagnoses:  Near syncope  Wheezing    Anita Gonzalez presents complaining of an episode of near syncope.  Findings and plan of care discussed with 11/05/2015, MD.  Patient is nontoxic appearing, in no apparent distress, not tachycardic, afebrile, no neurologic deficits, maintaining adequate oxygen saturation on her home  oxygen level of 2 L a minute. Patient's labs show no significant abnormalities. No orthostatic changes. CBG was 113 with EMS. 3:29 PM patient was reassessed and states that she feels somewhat better. Shortness of breath has improved as has her lightheadedness. Patient is alert and oriented and has been the entire time she has been in  the ED. Due to the above findings patient currently has no indication for head CT.  Dr. Dalene Seltzer to see the patient, perform her evaluation, and discharge. No abnormalities were found that would explain patient's near-syncopal event. Patient will be advised to follow-up with her PCP for chronic management of her chronic shortness of breath. Patient given home care instructions as well as return precautions.  Filed Vitals:   11/03/15 1330 11/03/15 1334 11/03/15 1358 11/03/15 1402  BP: 154/71 154/71 157/83 165/67  Pulse: 61 77 86 78  Temp:  98.4 F (36.9 C)    TempSrc:  Oral    Resp: 14 20 27 24   Height:  5\' 1"  (1.549 m)    Weight:  100.699 kg    SpO2: 93% 93% 91% 93%    Orthostatic VS for the past 24 hrs:  BP- Lying Pulse- Lying BP- Sitting Pulse- Sitting BP- Standing at 0 minutes Pulse- Standing at 0 minutes  11/03/15 1400 159/71 mmHg 77 164/63 mmHg 82 157/83 mmHg 88       Anselm Pancoast, PA-C 11/03/15 1644  Alvira Monday, MD 11/04/15 1506

## 2015-11-03 NOTE — ED Notes (Signed)
Per EMS pt from home, c/o lightlessness & dizziness and had near syncopal episode and fell forward and slid to kneesbut did not hit head or have LOC. Denies pain, n/v

## 2015-11-03 NOTE — ED Notes (Signed)
Doctor at beside. 

## 2015-11-03 NOTE — Discharge Instructions (Signed)
You have been seen today for a near syncopal event. Your imaging and lab tests showed no abnormalities. Follow up with PCP as soon as possible for chronic management of your shortness of breath. Return to ED should symptoms recur.

## 2015-11-03 NOTE — ED Notes (Signed)
Pt reports to the ED from home via [facility] for eval of near syncopal episode and fall. Pt stood up from bed, became lightheaded, and fell to her knees. Denies hitting her head or full LOC. CBG 113 mg/dl on EMS arrival. Pt denies any CP, N/V, or blurred vision. No neuro deficits noted. HR RRR on monitor. Pt A&Ox4, resp e/u, and skin warm and dry.

## 2015-11-12 ENCOUNTER — Other Ambulatory Visit: Payer: Medicare HMO

## 2015-11-19 ENCOUNTER — Other Ambulatory Visit (HOSPITAL_BASED_OUTPATIENT_CLINIC_OR_DEPARTMENT_OTHER): Payer: Medicare HMO

## 2015-11-19 ENCOUNTER — Ambulatory Visit: Payer: Medicare HMO | Admitting: Hematology

## 2015-11-19 DIAGNOSIS — D509 Iron deficiency anemia, unspecified: Secondary | ICD-10-CM

## 2015-11-19 DIAGNOSIS — D500 Iron deficiency anemia secondary to blood loss (chronic): Secondary | ICD-10-CM

## 2015-11-19 LAB — CBC & DIFF AND RETIC
BASO%: 0.1 % (ref 0.0–2.0)
Basophils Absolute: 0 10*3/uL (ref 0.0–0.1)
EOS%: 2.8 % (ref 0.0–7.0)
Eosinophils Absolute: 0.2 10*3/uL (ref 0.0–0.5)
HCT: 39.5 % (ref 34.8–46.6)
HGB: 12.4 g/dL (ref 11.6–15.9)
Immature Retic Fract: 6.4 % (ref 1.60–10.00)
LYMPH%: 19.8 % (ref 14.0–49.7)
MCH: 30.2 pg (ref 25.1–34.0)
MCHC: 31.4 g/dL — AB (ref 31.5–36.0)
MCV: 96.3 fL (ref 79.5–101.0)
MONO#: 0.4 10*3/uL (ref 0.1–0.9)
MONO%: 5.9 % (ref 0.0–14.0)
NEUT%: 71.4 % (ref 38.4–76.8)
NEUTROS ABS: 4.8 10*3/uL (ref 1.5–6.5)
Platelets: 268 10*3/uL (ref 145–400)
RBC: 4.1 10*6/uL (ref 3.70–5.45)
RDW: 14.4 % (ref 11.2–14.5)
RETIC %: 2.24 % — AB (ref 0.70–2.10)
Retic Ct Abs: 91.84 10*3/uL — ABNORMAL HIGH (ref 33.70–90.70)
WBC: 6.8 10*3/uL (ref 3.9–10.3)
lymph#: 1.3 10*3/uL (ref 0.9–3.3)

## 2015-11-19 LAB — IRON AND TIBC
%SAT: 10 % — ABNORMAL LOW (ref 21–57)
IRON: 31 ug/dL — AB (ref 41–142)
TIBC: 325 ug/dL (ref 236–444)
UIBC: 294 ug/dL (ref 120–384)

## 2015-11-19 LAB — FERRITIN: Ferritin: 137 ng/ml (ref 9–269)

## 2015-11-21 ENCOUNTER — Telehealth: Payer: Self-pay | Admitting: Hematology

## 2015-11-21 ENCOUNTER — Other Ambulatory Visit: Payer: Self-pay | Admitting: Hematology

## 2015-11-21 NOTE — Telephone Encounter (Signed)
DUE TO CALL MOVED 1/13 LAB/FU TO AN EARLIER TIME. SPOKE WITH PATIENT SHE IS AWARE. °

## 2015-11-22 ENCOUNTER — Telehealth (HOSPITAL_COMMUNITY): Payer: Self-pay | Admitting: *Deleted

## 2015-11-24 ENCOUNTER — Telehealth: Payer: Self-pay | Admitting: Hematology

## 2015-11-24 NOTE — Telephone Encounter (Signed)
Per response from inf scheduler fera cannot be added 1/13 move to 1/16. Per YF move 1/13 f/u also to 1/16. Left message for on both home/cell re changes and new appointments for 1/16 and 1/23.

## 2015-11-26 ENCOUNTER — Ambulatory Visit: Payer: Medicare HMO | Admitting: Hematology

## 2015-11-29 ENCOUNTER — Ambulatory Visit (HOSPITAL_BASED_OUTPATIENT_CLINIC_OR_DEPARTMENT_OTHER): Payer: Medicare HMO | Admitting: Hematology

## 2015-11-29 ENCOUNTER — Ambulatory Visit (HOSPITAL_BASED_OUTPATIENT_CLINIC_OR_DEPARTMENT_OTHER): Payer: Medicare HMO

## 2015-11-29 ENCOUNTER — Encounter: Payer: Self-pay | Admitting: Hematology

## 2015-11-29 VITALS — BP 147/90 | HR 89 | Temp 98.4°F | Resp 17 | Ht 61.0 in | Wt 223.2 lb

## 2015-11-29 VITALS — BP 131/58 | HR 89 | Temp 98.7°F | Resp 20

## 2015-11-29 DIAGNOSIS — M069 Rheumatoid arthritis, unspecified: Secondary | ICD-10-CM | POA: Diagnosis not present

## 2015-11-29 DIAGNOSIS — N179 Acute kidney failure, unspecified: Secondary | ICD-10-CM

## 2015-11-29 DIAGNOSIS — R0689 Other abnormalities of breathing: Secondary | ICD-10-CM

## 2015-11-29 DIAGNOSIS — R0600 Dyspnea, unspecified: Secondary | ICD-10-CM

## 2015-11-29 DIAGNOSIS — E119 Type 2 diabetes mellitus without complications: Secondary | ICD-10-CM

## 2015-11-29 DIAGNOSIS — D509 Iron deficiency anemia, unspecified: Secondary | ICD-10-CM

## 2015-11-29 DIAGNOSIS — I10 Essential (primary) hypertension: Secondary | ICD-10-CM

## 2015-11-29 DIAGNOSIS — G4733 Obstructive sleep apnea (adult) (pediatric): Secondary | ICD-10-CM

## 2015-11-29 MED ORDER — SODIUM CHLORIDE 0.9 % IV SOLN
510.0000 mg | Freq: Once | INTRAVENOUS | Status: AC
  Administered 2015-11-29: 510 mg via INTRAVENOUS
  Filled 2015-11-29: qty 17

## 2015-11-29 NOTE — Progress Notes (Signed)
[facility]  [phone] Fax:([phone]) [phone]  Clinic Follow up Note   Patient Care Team: [name], MD as PCP - General (Internal Medicine) Jearld Lesch, MD as Referring Physician (Specialist) Jearld Lesch, MD as Referring Physician (Specialist) Jeani Hawking, MD as Consulting Physician (Gastroenterology) Clyde Lundborg., MD as Referring Physician (Anesthesiology) 11/29/2015  CHIEF COMPLAINT:  Follow-up iron deficient anemia  HISTORY OF PRESENTING ILLNESS (01/2015):  Anita Gonzalez 70 y.o. female with history of DM 2, HTN, HLD, asthma, rheumatoid arthritis, former smoker, recently moved to this area from [place], is here because of anemia.   She presented to the ED on 01/06/2015 with fever, chills, productive cough, dyspnea, wheezing, chest pain and poor appetite of ~ 1 week duration. In the ED, febrile to 102.70F, tachycardic, WBC 12.5 and chest x-ray suggestive of right lower lobe pneumonia. Treating for CAP, then developed acute kidney injury-improving and narrow complex tachycardia 2/27. Her clinical condition improved and she was discharged home with oxygen on 01/11/2015. During her hospital stay, she was found to have moderate anemia with hemoglobin 8-9, low serum iron and ferritin level. She was referred to our clinic for further evaluation.  She was found to have abnormal CBC from 2011, and has been on iron pill two pill daily, and B12 injection and pill for the past two years. She had EGD, capsule endoscopy and colonoscopy in [place] a few years ago by Dr. Francella Solian, which was normal per pt.   She denies recent chest pain on exertion, (+) shortness of breath on exertion at night, no pre-syncopal episodes, or palpitations. She had not noticed any recent bleeding such as epistaxis, hematuria or hematochezia The patient denies over the counter NSAID ingestion. She is not on antiplatelets agents. She had no prior history or diagnosis of  cancer. Her age appropriate screening programs are up-to-date. She denies any pica and eats a variety of diet. She never donated blood or received blood transfusion  She is still on oxygen. She has some dyspnea at night. No orthopnea. She is able to do all her ADLs and light housework. She was not very physically active even before the hospitalization last month.  CURRENT THERAPY: IV Feraheme as need, received in 01/2015, 03/2015 and 09/2015   INTERIM HISTORY: Anita Gonzalez returns for follow-up. She was hospitalized in [place] for pneumonia around Thanksgiving of last year. After she returns to [facility] in early December, she still had cough and shortness breasts. She was seen at the emergency room on December 7 and December 23, and was giving another course of antibiotics on her second visit. Her cough has finally improved, she is less short of breast, she still uses oxygen as needed at home. No fever or chills. She complains of moderate fatigue, no chest pain or other new complaints. She denies any episodes of melena or hematochezia lately.   MEDICAL HISTORY:  Past Medical History  Diagnosis Date  . Diabetes mellitus without complication (HCC)   . Asthma   . Arthritis   . Hypertension   . Rheumatoid arthritis (HCC) 1996  . GERD (gastroesophageal reflux disease)   . Anemia   . Hay fever   . Bronchitis     SURGICAL HISTORY: Past Surgical History  Procedure Laterality Date  . Cesarean section    . Hernia repair    . Hemorrhoid surgery    . Cesarean section    . Hernia repair    . Abdominal hysterectomy    .  Esophagogastroduodenoscopy (egd) with propofol N/A 04/02/2015    Procedure: ESOPHAGOGASTRODUODENOSCOPY (EGD) WITH PROPOFOL;  Surgeon: Jeani Hawking, MD;  Location: [facility] END[facility];  Service: Endoscopy;  Laterality: N/A;  . Colonoscopy with propofol N/A 04/02/2015    Procedure: COLONOSCOPY WITH PROPOFOL;  Surgeon: Jeani Hawking, MD;  Location: [facility] ENDOS[facility];  Service:  Endoscopy;  Laterality: N/A;    SOCIAL HISTORY: Social History   Social History  . Marital Status: Single    Spouse Name: N/A  . Number of Children: N/A  . Years of Education: N/A   Occupational History  . retired    Social History Main Topics  . Smoking status: Former Smoker -- 0.75 packs/day for 25 years    Types: Cigarettes    Quit date: 01/12/2011  . Smokeless tobacco: Never Used  . Alcohol Use: 2.4 oz/week    4 Cans of beer per week     Comment: occ  . Drug Use: No  . Sexual Activity: Not on file   Other Topics Concern  . Not on file   Social History Narrative    FAMILY HISTORY: Family History  Problem Relation Age of Onset  . Diabetes Mother   . Heart failure Mother   . Throat cancer Brother     throat cancer     ALLERGIES:  is allergic to fish allergy; peanuts; hydrocodone; iodinated diagnostic agents; other; oxycodone; penicillins; and percocet.  MEDICATIONS:  Current Outpatient Prescriptions  Medication Sig Dispense Refill  . albuterol (PROVENTIL HFA;VENTOLIN HFA) 108 (90 BASE) MCG/ACT inhaler Inhale 2 puffs into the lungs every 6 (six) hours as needed for wheezing or shortness of breath (wheezing).     . bumetanide (BUMEX) 2 MG tablet Take 2 mg by mouth 2 (two) times a week. Monday and Thursday.    . clobetasol (TEMOVATE) 0.05 % GEL Apply 1 application topically 2 (two) times daily. 30 each 0  . cyanocobalamin 1000 MCG tablet Take 100 mcg by mouth once a week. Monday    . cyclobenzaprine (FLEXERIL) 10 MG tablet Take 10 mg by mouth daily.     . ergocalciferol (VITAMIN D2) 50000 UNITS capsule Take 50,000 Units by mouth once a week. Thursday.    . ferrous sulfate 325 (65 FE) MG tablet Take 325 mg by mouth 2 (two) times daily with a meal.     . fluticasone (FLONASE) 50 MCG/ACT nasal spray Place 1 spray into both nostrils daily as needed for allergies or rhinitis.    . Fluticasone-Salmeterol (ADVAIR) 500-50 MCG/DOSE AEPB Inhale 2 puffs into the lungs daily.  (Patient taking differently: Inhale 2 puffs into the lungs 2 (two) times daily. ) 3 each 0  . folic acid (FOLVITE) 1 MG tablet Take 1 mg by mouth daily.    Marland Kitchen ipratropium-albuterol (DUONEB) 0.5-2.5 (3) MG/3ML SOLN Inhale 3 mLs into the lungs every 6 (six) hours.    . lidocaine (XYLOCAINE) 5 % ointment Apply 1 application topically 3 (three) times daily as needed (joint pain).    Marland Kitchen loperamide (IMODIUM A-D) 2 MG tablet Take 2 mg by mouth 4 (four) times daily as needed for diarrhea or loose stools.    . meloxicam (MOBIC) 7.5 MG tablet Take 15 mg by mouth daily.     . metFORMIN (GLUCOPHAGE) 500 MG tablet Take 500 mg by mouth 2 (two) times daily.    . metoprolol tartrate (LOPRESSOR) 25 MG tablet Take 1 tablet (25 mg total) by mouth 2 (two) times daily. (Patient taking differently: Take 25 mg by mouth  3 (three) times daily. ) 60 tablet 0  . montelukast (SINGULAIR) 10 MG tablet Take 10 mg by mouth at bedtime.    . Multiple Vitamin (MULTIVITAMIN) tablet Take 1 tablet by mouth daily. With iron.    Marland Kitchen omeprazole (PRILOSEC) 20 MG capsule Take 20 mg by mouth daily.    . potassium chloride (K-DUR,KLOR-CON) 10 MEQ tablet Take 10 mEq by mouth every other day.     . senna (SENOKOT) 8.6 MG TABS tablet Take 8.6 mg by mouth 2 (two) times daily as needed for mild constipation.     . simvastatin (ZOCOR) 20 MG tablet Take 20 mg by mouth every evening.     . sodium chloride HYPERTONIC 3 % nebulizer solution Take 3 mLs by nebulization every 6 (six) hours.    . valsartan (DIOVAN) 80 MG tablet Take 1 tablet by mouth daily.    . verapamil (VERELAN PM) 360 MG 24 hr capsule Take 1 capsule (360 mg total) by mouth daily. 30 capsule 0   No current facility-administered medications for this visit.    REVIEW OF SYSTEMS:   Constitutional: Denies fevers, chills or abnormal night sweats, (+) fatigue Eyes: Denies blurriness of vision, double vision or watery eyes Ears, nose, mouth, throat, and face: Denies mucositis or sore  throat Respiratory: Denies cough, (+) dyspnea, no wheezes Cardiovascular: Denies palpitation, chest discomfort or lower extremity swelling Gastrointestinal:  Denies nausea, heartburn or change in bowel habits Skin: Denies abnormal skin rashes Lymphatics: Denies new lymphadenopathy or easy bruising Neurological:Denies numbness, tingling or new weaknesses Behavioral/Psych: Mood is stable, no new changes  All other systems were reviewed with the patient and are negative.  PHYSICAL EXAMINATION: ECOG PERFORMANCE STATUS: 2 - Symptomatic, <50% confined to bed  Filed Vitals:   11/29/15 1346 11/29/15 1348  BP: 161/71 147/90  Pulse: 89   Temp: 98.4 F (36.9 C)   Resp: 17    Filed Weights   11/29/15 1346  Weight: 223 lb 3.2 oz (101.243 kg)    GENERAL:alert, no distress and comfortable SKIN: skin color, texture, turgor are normal, no rashes or significant lesions EYES: normal, conjunctiva are pink and non-injected, sclera clear OROPHARYNX:no exudate, no erythema and lips, buccal mucosa, and tongue normal  NECK: supple, thyroid normal size, non-tender, without nodularity LYMPH:  no palpable lymphadenopathy in the cervical, axillary or inguinal LUNGS: clear to auscultation and percussion with normal breathing effort HEART: regular rate & rhythm and no murmurs and no lower extremity edema ABDOMEN:abdomen soft, non-tender and normal bowel sounds Musculoskeletal:no cyanosis of digits and no clubbing  PSYCH: alert & oriented x 3 with fluent speech NEURO: no focal motor/sensory deficits  LABORATORY DATA:  I have reviewed the data as listed CBC Latest Ref Rng 11/19/2015 11/03/2015 10/20/2015  WBC 3.9 - 10.3 10e3/uL 6.8 4.7 6.3  Hemoglobin 11.6 - 15.9 g/dL 19.1 47.8 11.2(L)  Hematocrit 34.8 - 46.6 % 39.5 38.1 36.0  Platelets 145 - 400 10e3/uL 268 204 209    CMP Latest Ref Rng 11/03/2015 10/20/2015 02/25/2015  Glucose 65 - 99 mg/dL 295(A) 213(Y) 89  BUN 6 - 20 mg/dL 9 18 86.5  Creatinine 7.84  - 1.00 mg/dL 6.96(E) 9.52(W) 1.0  Sodium 135 - 145 mmol/L 143 143 142  Potassium 3.5 - 5.1 mmol/L 4.7 3.8 4.0  Chloride 101 - 111 mmol/L 102 106 -  CO2 22 - 32 mmol/L 32 30 30(H)  Calcium 8.9 - 10.3 mg/dL 9.7 9.0 8.8  Total Protein 6.5 - 8.1 g/dL - 4.1(L)  6.8  Total Bilirubin 0.3 - 1.2 mg/dL - 0.5 1.51  Alkaline Phos 38 - 126 U/L - 76 90  AST 15 - 41 U/L - 22 24  ALT 14 - 54 U/L - 32 23   Results for Anita Gonzalez, Anita Gonzalez (MRN [id number]) as of 11/29/2015 14:12  Ref. Range 11/19/2015 14:10  Iron Latest Ref Range: 41-142 ug/dL 31 (L)  UIBC Latest Ref Range: 120-384 ug/dL 062  TIBC Latest Ref Range: 236-444 ug/dL 694  %SAT Latest Ref Range: 21-57 % 10 (L)  Ferritin Latest Ref Range: 9-269 ng/ml 137    RADIOGRAPHIC STUDIES: I have personally reviewed the radiological images as listed and agreed with the findings in the report. Dg Chest 2 View  11/03/2015  CLINICAL DATA:  Dizziness, shortness of breath, cough EXAM: CHEST  2 VIEW COMPARISON:  10/20/2015 FINDINGS: Low lung volumes with bibasilar atelectasis. Heart is normal size. No effusions. No acute bony abnormality. IMPRESSION: Low lung volumes with bibasilar atelectasis. Electronically Signed   By: Charlett Nose M.D.   On: 11/03/2015 14:31   Ct Head Wo Contrast  11/03/2015  CLINICAL DATA:  Weakness and dizziness for 1 day. EXAM: CT HEAD WITHOUT CONTRAST TECHNIQUE: Contiguous axial images were obtained from the base of the skull through the vertex without intravenous contrast. COMPARISON:  None. FINDINGS: The ventricles are in the midline without mass effect or shift. They are normal in size and configuration. The gray-white differentiation is maintained. Benign-appearing bilateral basal ganglia calcifications. No extra-axial fluid collections are identified. There is mild cerebral atrophy. No CT findings for acute hemispheric infarction or intracranial hemorrhage. No mass lesions. The brainstem and cerebellum are grossly normal. No significant bony  findings. Both halves of the sphenoid sinus demonstrate air-fluid levels suggesting acute sinusitis. Scattered ethmoid sinus mucoperiosteal thickening. The maxillary and frontal sinuses are clear. The mastoid air cells and middle ear cavities are clear. IMPRESSION: 1. No acute intracranial findings or mass lesions. 2. Sphenoid and ethmoid sinus disease. Electronically Signed   By: Rudie Meyer M.D.   On: 11/03/2015 19:36    ASSESSMENT & PLAN: 70 year old African-American female with past medical history of diabetes, hypertension, rheumatoid arthritis, who has chronic anemia for a few years.  1. Normocytic anemia secondary to iron deficiency, from GI AVM bleeding  -She has moderate anemia with hemoglobin in 8-9 range, MCV normal, ferritin 16, serum iron 13, URBC elevated at 430, iron saturation 3%, this is consistent with iron deficient anemia -Anemia has been a few years, likely secondary to slow GI bleeding. Her stool OB was positive. Colonoscopy showed AVM, last done in 03/2015. -Her SPEP and UPEP were negative.  -She also may have a component of anemia of chronic disease secondary to kidney disease and rheumatoid arthritis -Her hemoglobin has significantly improved after IV Feraheme, she received IV Feraheme 4 times in 2016, hemoglobin 12.4 today. -Lab results reviewed with her today. Giving her significant fatigue, and a low serum iron level, we'll give IV Feraheme today. -She will continue oral iron supplement -We'll continue monitoring her CBC and iron level, consider IV Feraheme as needed  2. Hypertension, diabetes, rheumatoid arthritis -She will continue follow-up with her primary care physician.  3. Recent episodes of pneumonia -Improving, she completed a course of antibiotics.  Plan -IV Feraheme today -Repeat a CBC, Feraheme and iron study in 2 months, she will call me the day after her lab test -I'll see him back in 4 months with repeated lab one week before  -Consider IV Feraheme  if ferritin less than 50 or very low serum iron level  All questions were answered. The patient knows to call the clinic with any problems, questions or concerns. I spent 20 minutes counseling the patient face to face. The total time spent in the appointment was 25 minutes and more than 50% was on counseling.     Malachy Mood, MD 11/29/2015 2:11 PM

## 2015-11-30 ENCOUNTER — Telehealth: Payer: Self-pay | Admitting: Hematology

## 2015-11-30 NOTE — Telephone Encounter (Signed)
per pof to sch pt appt-sent MW email to sch fera-will call pt after reply °

## 2015-12-01 ENCOUNTER — Telehealth: Payer: Self-pay | Admitting: Internal Medicine

## 2015-12-01 ENCOUNTER — Ambulatory Visit (INDEPENDENT_AMBULATORY_CARE_PROVIDER_SITE_OTHER): Payer: Medicare HMO | Admitting: Internal Medicine

## 2015-12-01 ENCOUNTER — Encounter: Payer: Self-pay | Admitting: Internal Medicine

## 2015-12-01 VITALS — BP 122/80 | HR 77 | Ht 61.0 in | Wt 220.0 lb

## 2015-12-01 DIAGNOSIS — G4762 Sleep related leg cramps: Secondary | ICD-10-CM

## 2015-12-01 DIAGNOSIS — G4733 Obstructive sleep apnea (adult) (pediatric): Secondary | ICD-10-CM

## 2015-12-01 DIAGNOSIS — J9601 Acute respiratory failure with hypoxia: Secondary | ICD-10-CM | POA: Diagnosis not present

## 2015-12-01 HISTORY — DX: Sleep related leg cramps: G47.62

## 2015-12-01 MED ORDER — TEMAZEPAM 15 MG PO CAPS: ORAL_CAPSULE | ORAL | Status: DC

## 2015-12-01 NOTE — Assessment & Plan Note (Signed)
Already on supplemental potassium. Plan-try tonic water for quinine, stretching

## 2015-12-01 NOTE — Assessment & Plan Note (Addendum)
Mild obstructive sleep apnea complicated by significant obesity and pre-existing nocturnal hypoxemia/sleep associated respiratory failure. Plan-trial of CPAP auto titration. Weight loss encouraged.

## 2015-12-01 NOTE — Progress Notes (Signed)
HPI  PCP OSEI-BONSU,GEORGE, MD  HPI  IOV 04/06/2015  Chief Complaint  Patient presents with  . Pulmonary Consult    Pt referred by Raelyn Number, PA for O2 dependence.    69 year old female moved from [place] to [place] in 2016. Has a history of type 2 diabetes, hypertension, hyperlipidemia, asthma not otherwise specified, rheumatoid arthritis, former smoker and significant obesity  She started good historian. The history is minimal elevated from review of the outside records from 02/03/2015 and review of [facility] medical records from February 2016. And in talking to the patient.  At baseline she is living in [place], [place]. She carries a long-standing diagnosis of asthma for which she takes possibly Advair on a non-compliant basis and albuterol as needed. Because of this she has chronic cough and shortness of breath with exertion and relieved by rest. She moved into December 2015 to [place], [place] to be close to her daughter. Then around 01/06/2015 she developed acute worsening of cough with mucus production and was hospitalized under the tried [employer] [employer] [facility] for 5 days. Chest x-ray to admission documented below showed right lower lobe consolidation. She was treated as community acquired pneumonia Since then she's been placed on oxygen at discharge. Prior to that she was never on oxygen. She is unaware if she was having exertional hypoxemia prior to this admission. Currently she feels that the pneumonia has resolved and she is back to her baseline health. In the office today when she exerted she did desaturate to 60%. However she does not feel this.   In terms of her past medical history - Smoking: 18.5 prior smoking history  - RA: REports RA NOS - not able to give me details. Not on immune modulator. Not seen rheumatlogist in [place] - ASthma: as above. LAst admit 2 years ago due to aeasthma - ILD - denies =-OSA: denies testing or diagnosis but appears  to be high risk for it  Chest x-ray from 01/06/2015 personally visualized image shows right lower lobe consolidation. Agree with the formal interpretation  Outside lab tests from primary care physician reviewed dated 02/03/2015 ESR 34, ANA negative, rheumatoid factor normal, normal TSH creatinine 0.9 mg percent. Abnormal values include hemoglobin of 9 g percent [in the hospital hemoglobin fluctuated between 7.5 g percent and 8.4 g percent. Most recent hemoglobin 03/26/2015 shows improvement of 10.6 g percent.  has a past medical history of Diabetes mellitus without complication; Asthma; Arthritis; Hypertension; Rheumatoid arthritis (1996); GERD (gastroesophageal reflux disease); Anemia; and Hay fever.  reports that she quit smoking about 4 years ago. Her smoking use included Cigarettes. She has a 18.75 pack-year smoking history. She has never used smokeless tobacco.   04/23/2015 Follow up  Patient returns for two-week follow-up She was seen for new pulmonary consult for dyspnea and hypoxia. Patient was admitted in February for pneumonia and treated with anabiotic's. She was placed on oxygen at discharge due to persistent desaturations. She is a former smoker. Patient was set up for a CT chest that showed mild diffuse bronchiectasis, very mild groundglass attenuation. Atherosclerosis. No evidence of interstitial lung disease. PFT shows restriction with a low diffusing capacity. FEV1 was 1.20 L, 72%, ratio 88, positive broncho-dilator response. Diffusing capacity 35% (PFTdid show patient had difficulty with DLCO component) 2-D echo showed an EF of 60-65%,, grade 1 diastolic dysfunction. Patient says she was seen in [place] by cardiologist but has not establish here in [place] . Patient is currently on oxygen 2 L with activity and at  bedtime. Has been told that she snores and does have some daytime sleepiness. Last visit. Patient had desaturations with ambulation.  REC Sleep  study VQ Cards referral   OV 07/20/2015  Chief Complaint         11/01/2015 acute OV 70 year old female former smoker-MR pt. Follows for: Dyspnea. Pt c/o recent Pna diagnosis 11/16. Pt states that she is still sick with cough with yellow/green mucus, SOB, wheeze and tightness. Pt states that her dyspnea is still limiting her. Pt is on 2L pulsed while ambulating and sleeping.  O2 2 L/Sleep/[facility] ER visit 10/20/2015-presenting with dyspnea and palpitations. Reference made to hospitalization in [place] 2 weeks previously for 6 days, treated with Levaquin and prednisone for pneumonia. Workup was negative for DVT and positive for right lower lobe pneumonia area Still coughing minimally productive. Night sweats. O2 2 L/ CXR 10/20/2015-my review-prominent markings consistent with bronchitis but marked hypoventilation consistent with abdominal obesity. Low lung volumes contribute to appearance of prominent markings. IMPRESSION: Chronic lung disease and hypoventilation. No acute finding or definite change since 04/30/2015. Electronically Signed  By: Monte Fantasia M.D.  On: 10/20/2015 11:39  --------------------------- 12/01/2015-70 year old female former smoker referred to me for new problem of sleep disorder: Referred by MR for mild sleep apnea and insomina. Last sleep study done 2016. Having trouble falling and staying asleep. Epworth score: 11.  O2 2 L sleep/[facility] Epworth score 29/56 Complicating medical problems include DM 2, HTN, HLD, asthma, rheumatoid arthritis, anemia NPSG 06/13/15- mild OSA, AHI 11.1 per hour, desaturation to 62%, loud snoring, body weight 218 pounds She is most aware of insomnia with difficulty falling asleep then repeated waking during the night. Return to sleep once awake. No sleep meds. Occasional caffeine. Daughter tells her she snores loudly. No history of ENT surgery. Occasional naps. Sometimes falls asleep with TV at night. Leg cramps at night are  frequent and contribute to insomnia. She denies sleepwalking or complex behaviors.  Prior to Admission medications   Medication Sig Start Date End Date Taking? Authorizing Provider  albuterol (PROVENTIL HFA;VENTOLIN HFA) 108 (90 BASE) MCG/ACT inhaler Inhale 2 puffs into the lungs every 6 (six) hours as needed for wheezing or shortness of breath (wheezing).    Yes Historical Provider, MD  bumetanide (BUMEX) 2 MG tablet Take 2 mg by mouth 2 (two) times a week. Monday and Thursday.   Yes Historical Provider, MD  clobetasol (TEMOVATE) 0.05 % GEL Apply 1 application topically 2 (two) times daily. 12/21/14  Yes Hannah Muthersbaugh, PA-C  cyanocobalamin 1000 MCG tablet Take 100 mcg by mouth once a week. Monday   Yes Historical Provider, MD  cyclobenzaprine (FLEXERIL) 10 MG tablet Take 10 mg by mouth daily.    Yes Historical Provider, MD  ergocalciferol (VITAMIN D2) 50000 UNITS capsule Take 50,000 Units by mouth once a week. Thursday.   Yes Historical Provider, MD  ferrous sulfate 325 (65 FE) MG tablet Take 325 mg by mouth 2 (two) times daily with a meal.    Yes Historical Provider, MD  fluticasone (FLONASE) 50 MCG/ACT nasal spray Place 1 spray into both nostrils daily as needed for allergies or rhinitis.   Yes Historical Provider, MD  Fluticasone-Salmeterol (ADVAIR) 500-50 MCG/DOSE AEPB Inhale 2 puffs into the lungs daily. Patient taking differently: Inhale 2 puffs into the lungs 2 (two) times daily.  01/11/15  Yes Nishant Dhungel, MD  folic acid (FOLVITE) 1 MG tablet Take 1 mg by mouth daily.   Yes Historical Provider, MD  ipratropium-albuterol (DUONEB) 0.5-2.5 (3)  MG/3ML SOLN Inhale 3 mLs into the lungs every 6 (six) hours. 10/08/15  Yes Historical Provider, MD  lidocaine (XYLOCAINE) 5 % ointment Apply 1 application topically 3 (three) times daily as needed (joint pain).   Yes Historical Provider, MD  loperamide (IMODIUM A-D) 2 MG tablet Take 2 mg by mouth 4 (four) times daily as needed for diarrhea or  loose stools.   Yes Historical Provider, MD  meloxicam (MOBIC) 7.5 MG tablet Take 15 mg by mouth daily.    Yes Historical Provider, MD  metFORMIN (GLUCOPHAGE) 500 MG tablet Take 500 mg by mouth 2 (two) times daily. 05/05/15  Yes Historical Provider, MD  metoprolol tartrate (LOPRESSOR) 25 MG tablet Take 1 tablet (25 mg total) by mouth 2 (two) times daily. Patient taking differently: Take 25 mg by mouth 3 (three) times daily.  01/11/15  Yes Nishant Dhungel, MD  montelukast (SINGULAIR) 10 MG tablet Take 10 mg by mouth at bedtime.   Yes Historical Provider, MD  Multiple Vitamin (MULTIVITAMIN) tablet Take 1 tablet by mouth daily. With iron.   Yes Historical Provider, MD  omeprazole (PRILOSEC) 20 MG capsule Take 20 mg by mouth daily.   Yes Historical Provider, MD  potassium chloride (K-DUR,KLOR-CON) 10 MEQ tablet Take 10 mEq by mouth every other day.    Yes Historical Provider, MD  senna (SENOKOT) 8.6 MG TABS tablet Take 8.6 mg by mouth 2 (two) times daily as needed for mild constipation.    Yes Historical Provider, MD  simvastatin (ZOCOR) 20 MG tablet Take 20 mg by mouth every evening.    Yes Historical Provider, MD  sodium chloride HYPERTONIC 3 % nebulizer solution Take 3 mLs by nebulization every 6 (six) hours. 10/11/15  Yes Historical Provider, MD  valsartan (DIOVAN) 80 MG tablet Take 1 tablet by mouth daily. 10/25/15  Yes Historical Provider, MD  verapamil (VERELAN PM) 360 MG 24 hr capsule Take 1 capsule (360 mg total) by mouth daily. 01/11/15  Yes Nishant Dhungel, MD  temazepam (RESTORIL) 15 MG capsule 1 or 2 AT BEDTIME FOR SLEEP IF NEEDED 12/01/15   Deneise Lever, MD   Past Medical History  Diagnosis Date  . Diabetes mellitus without complication ([place])   . Asthma   . Arthritis   . Hypertension   . Rheumatoid arthritis ([place]) 1996  . GERD (gastroesophageal reflux disease)   . Anemia   . Hay fever   . Bronchitis    Past Surgical History  Procedure Laterality Date  . Cesarean section    .  Hernia repair    . Hemorrhoid surgery    . Cesarean section    . Hernia repair    . Abdominal hysterectomy    . Esophagogastroduodenoscopy (egd) with propofol N/A 04/02/2015    Procedure: ESOPHAGOGASTRODUODENOSCOPY (EGD) WITH PROPOFOL;  Surgeon: Carol Ada, MD;  Location: [facility] END[facility];  Service: Endoscopy;  Laterality: N/A;  . Colonoscopy with propofol N/A 04/02/2015    Procedure: COLONOSCOPY WITH PROPOFOL;  Surgeon: Carol Ada, MD;  Location: [facility] ENDOSCOPY;  Service: Endoscopy;  Laterality: N/A;   Family History  Problem Relation Age of Onset  . Diabetes Mother   . Heart failure Mother   . Throat cancer Brother     throat cancer    Social History   Social History  . Marital Status: Single    Spouse Name: N/A  . Number of Children: N/A  . Years of Education: N/A   Occupational History  . retired    Social History Main  Topics  . Smoking status: Former Smoker -- 0.75 packs/day for 25 years    Types: Cigarettes    Quit date: 01/12/2011  . Smokeless tobacco: Never Used  . Alcohol Use: 2.4 oz/week    4 Cans of beer per week     Comment: occ  . Drug Use: No  . Sexual Activity: Not on file   Other Topics Concern  . Not on file   Social History Narrative   ROS-see HPI   Negative unless "+" Constitutional:    weight loss, + night sweats, fevers, chills, fatigue, lassitude. HEENT:    headaches, difficulty swallowing, tooth/dental problems, sore throat,       sneezing, itching, ear ache, nasal congestion, post nasal drip, snoring CV:    chest pain, orthopnea, PND, swelling in lower extremities, anasarca,                                                      dizziness, palpitations Resp:   + shortness of breath with exertion or at rest.                productive cough,   + non-productive cough, coughing up of blood.              change in color of mucus.  wheezing.   Skin:    rash or lesions. GI:  No-   heartburn, indigestion, abdominal pain, nausea, vomiting, GU:  MS:    joint pain, stiffness, decreased range of motion, back pain. Neuro-     nothing unusual Psych:  change in mood or affect.  depression or anxiety.   memory loss.  OBJ- Physical Exam General- Alert, Oriented, Affect-appropriate, Distress- none acute, + morbid obesity Skin- rash-none, lesions- none, excoriation- none Lymphadenopathy- none Head- atraumatic            Eyes- Gross vision intact, PERRLA, conjunctivae and secretions clear            Ears- Hearing, canals-normal            Nose- Clear, no-Septal dev, mucus, polyps, erosion, perforation             Throat- Mallampati IIi , mucosa clear , drainage- none, tonsils- atrophic Neck- flexible , trachea midline, no stridor , thyroid nl, carotid no bruit Chest - symmetrical excursion , unlabored           Heart/CV- RRR occasional extra beat , no murmur , no gallop  , no rub, nl s1 s2                           - JVD- none , edema- none, stasis changes- none, varices- none           Lung- + shallow consistent with body habitus, clear to P&A, wheeze + bilateral expiratory, cough- none , dullness-none, rub- none           Chest wall-  Abd-  Br/ Gen/ Rectal- Not done, not indicated Extrem- cyanosis- none, clubbing, none, atrophy- none, strength- nl Neuro- grossly intact to observation

## 2015-12-01 NOTE — Telephone Encounter (Signed)
Patient called to confirm appointments. Spoke with patient re appointments for January/March/May. Patient to get new schedule 1/23.

## 2015-12-01 NOTE — Assessment & Plan Note (Signed)
Continues oxygen for sleep. We will have it bled in through her CPAP machine, representing a separate problem

## 2015-12-01 NOTE — Telephone Encounter (Signed)
Spoke with Okey Regal. Pt currently uses oxygen at night through [place]. Okey Regal wanted to know if we wanted the pt's oxygen bled through her CPAP. Advised her yes, we would like to have this bled through. Nothing further was needed.

## 2015-12-01 NOTE — Patient Instructions (Signed)
Order- DME [facility]  Now on O2 2L for sleep. Add new CPAP auto 5-15, mask of choice, humidifier, supplies, AirView                         Dx OSA  Script for temazepam 15 mg, 1 at bedtime if needed for insomnia  Try Tonic water from grocery store for leg cramps-   1 juice glass, flavored with orange or tomato juice, at supper each night

## 2015-12-06 ENCOUNTER — Ambulatory Visit (HOSPITAL_BASED_OUTPATIENT_CLINIC_OR_DEPARTMENT_OTHER): Payer: Medicare HMO

## 2015-12-06 VITALS — BP 133/66 | HR 71 | Temp 98.4°F | Resp 18

## 2015-12-06 DIAGNOSIS — D509 Iron deficiency anemia, unspecified: Secondary | ICD-10-CM | POA: Diagnosis not present

## 2015-12-06 MED ORDER — SODIUM CHLORIDE 0.9 % IV SOLN
510.0000 mg | Freq: Once | INTRAVENOUS | Status: AC
  Administered 2015-12-06: 510 mg via INTRAVENOUS
  Filled 2015-12-06: qty 17

## 2015-12-06 MED ORDER — SODIUM CHLORIDE 0.9 % IJ SOLN
10.0000 mL | INTRAMUSCULAR | Status: DC | PRN
  Filled 2015-12-06: qty 10

## 2015-12-06 MED ORDER — SODIUM CHLORIDE 0.9 % IV SOLN
INTRAVENOUS | Status: DC
  Administered 2015-12-06: 16:00:00 via INTRAVENOUS

## 2015-12-06 NOTE — Patient Instructions (Signed)

## 2015-12-10 ENCOUNTER — Telehealth: Payer: Self-pay | Admitting: Internal Medicine

## 2015-12-10 NOTE — Telephone Encounter (Signed)
LMTCB x 1 

## 2015-12-13 NOTE — Telephone Encounter (Signed)
Patient wants to know if she was to continue wearing the Oxygen along with CPAP.   Advised patient that she is suppose to be wearing them together. Patient states that she has not received tubing that attaches to her CPAP to bleed her Oxygen, she says that she is just using the nasal cannula and needs to know how to bleed the oxygen. Per TE on 12/01/15, Okey Regal was advised that patient needed to have oxygen bled through CPAP. I called and spoke with Okey Regal, she said that she was not aware that oxygen needed to bleed through CPAP and that she would have her PT team go out today to set up patient with the adapter to bled oxygen through her CPAP and show her how to use oxygen and CPAP machine.   Called patient and left detailed message on patient's voicemail advising her that Christoper Allegra would be coming by to set up her oxygen and CPAP. Advised patient to call back if she has any questions. Nothing further needed.    Lorel Monaco, CMA at 12/01/2015 3:37 PM     Status: Signed       Expand All Collapse All   Spoke with Okey Regal. Pt currently uses oxygen at night through [place]. Okey Regal wanted to know if we wanted the pt's oxygen bled through her CPAP. Advised her yes, we would like to have this bled through. Nothing further was needed.           Patient Instructions     Order- DME Christ[facility] Allegra Now on O2 2L for sleep. Add new CPAP auto 5-15, mask of choice, humidifier, supplies, AirView   Dx OSA

## 2015-12-14 ENCOUNTER — Telehealth: Payer: Self-pay | Admitting: Internal Medicine

## 2015-12-14 DIAGNOSIS — G4733 Obstructive sleep apnea (adult) (pediatric): Secondary | ICD-10-CM

## 2015-12-14 NOTE — Telephone Encounter (Signed)
Order DME to reduce her auto CPAP to 5-10      Dx OSA

## 2015-12-14 NOTE — Telephone Encounter (Signed)
Called spoke with pt. She reports her CPAP settings were change to auto 5-15 cm on 12/01/15. She reports the pressure is too strong for her. She reports she is unable to wear CPAP all night d/t this. She wants to know if the CPAP pressure can be decreased? Please advise thanks

## 2015-12-14 NOTE — Telephone Encounter (Signed)
Called spoke with pt and is aware of change below. Order placed. Nothing further needed

## 2015-12-23 ENCOUNTER — Telehealth: Payer: Self-pay | Admitting: Internal Medicine

## 2015-12-23 NOTE — Telephone Encounter (Signed)
She is in computer to be call in may for this appt Tobe Sos

## 2015-12-23 NOTE — Telephone Encounter (Signed)
Per 07/30/15 OV w/ MR: #followup  - CT chest in 9 months - FU to see me in 9 months ---  Called spoke with pt. She is scheduled to see MR on 6/9 at 1:30. Pt is also due for CT in June (already ordered in epic). Please advise PCC's thanks

## 2015-12-23 NOTE — Telephone Encounter (Signed)
Called and spoke to pt. Informed her that PCCs will call her in May to schedule the CT chest. Pt verbalized understanding and denied any further questions or concerns at this time.

## 2015-12-23 NOTE — Telephone Encounter (Signed)
Spoke with the pt  She states that she feels that she feels CPAP pressure is too high She feels that the "air is coming out too fast"  She called [facility] and they advised her to turn the pressure down to 5  She did this and noticed no difference  She also c/o "notrils burning"  I have printed DL for CDY to review  Please advise thanks!

## 2015-12-24 NOTE — Telephone Encounter (Signed)
Pressure of 5 is the lowest it will go. Suggest she try this through the weekend. What kind of mask is she using? The nasal pillows masks are more apt to jet the air into her nostrils. If that is what she is using, then a nasal mask or full-face mask might be better. She would talk to her DME about changing. If she can't use it more, then insurance will take it away from her. We can tell from the download report that it does work to control her sleep apnea. Suggest she try protecting the lining of her nostrils by using an otc nasal saline GEL, like from [employer] or [employer]

## 2015-12-24 NOTE — Telephone Encounter (Signed)
Called spoke with pt. Aware of recs below. She will talk with DME about changing masks. nothing further needed

## 2015-12-29 ENCOUNTER — Encounter: Payer: Self-pay | Admitting: Internal Medicine

## 2016-01-28 ENCOUNTER — Other Ambulatory Visit (HOSPITAL_BASED_OUTPATIENT_CLINIC_OR_DEPARTMENT_OTHER): Payer: Medicare HMO

## 2016-01-28 DIAGNOSIS — D509 Iron deficiency anemia, unspecified: Secondary | ICD-10-CM | POA: Diagnosis not present

## 2016-01-28 DIAGNOSIS — D500 Iron deficiency anemia secondary to blood loss (chronic): Secondary | ICD-10-CM

## 2016-01-28 LAB — CBC & DIFF AND RETIC
BASO%: 0 % (ref 0.0–2.0)
BASOS ABS: 0 10*3/uL (ref 0.0–0.1)
EOS%: 6.7 % (ref 0.0–7.0)
Eosinophils Absolute: 0.4 10*3/uL (ref 0.0–0.5)
HEMATOCRIT: 38.9 % (ref 34.8–46.6)
HGB: 12.4 g/dL (ref 11.6–15.9)
IMMATURE RETIC FRACT: 9.3 % (ref 1.60–10.00)
LYMPH#: 1.5 10*3/uL (ref 0.9–3.3)
LYMPH%: 27.9 % (ref 14.0–49.7)
MCH: 31.5 pg (ref 25.1–34.0)
MCHC: 31.9 g/dL (ref 31.5–36.0)
MCV: 98.7 fL (ref 79.5–101.0)
MONO#: 0.3 10*3/uL (ref 0.1–0.9)
MONO%: 4.6 % (ref 0.0–14.0)
NEUT#: 3.3 10*3/uL (ref 1.5–6.5)
NEUT%: 60.8 % (ref 38.4–76.8)
Platelets: 240 10*3/uL (ref 145–400)
RBC: 3.94 10*6/uL (ref 3.70–5.45)
RDW: 14.1 % (ref 11.2–14.5)
RETIC %: 2.71 % — AB (ref 0.70–2.10)
RETIC CT ABS: 106.77 10*3/uL — AB (ref 33.70–90.70)
WBC: 5.5 10*3/uL (ref 3.9–10.3)

## 2016-01-28 LAB — FERRITIN: Ferritin: 279 ng/ml — ABNORMAL HIGH (ref 9–269)

## 2016-01-28 LAB — IRON AND TIBC
%SAT: 17 % — ABNORMAL LOW (ref 21–57)
IRON: 52 ug/dL (ref 41–142)
TIBC: 298 ug/dL (ref 236–444)
UIBC: 246 ug/dL (ref 120–384)

## 2016-02-07 ENCOUNTER — Telehealth: Payer: Self-pay | Admitting: Internal Medicine

## 2016-02-07 DIAGNOSIS — G894 Chronic pain syndrome: Secondary | ICD-10-CM | POA: Insufficient documentation

## 2016-02-07 DIAGNOSIS — G4733 Obstructive sleep apnea (adult) (pediatric): Secondary | ICD-10-CM

## 2016-02-07 NOTE — Telephone Encounter (Signed)
Spoke with pt, agrees with below recs.  Referral placed to orthodontics.  Nothing further needed.

## 2016-02-07 NOTE — Telephone Encounter (Signed)
Suggest we refer to orthodontist Dr Althea Grimmer for consideration of oral appliance     For dx OSA She can meet Dr Myrtis Ser and learn if an oral appliance from him could be an alternative way to treat her OSA without CPAP.

## 2016-02-07 NOTE — Telephone Encounter (Signed)
Spoke with Amy at [facility]. States that the pt called her and told her that she is not able to use her CPAP. The pressure is to high per the pt.  Called and spoke with the pt. States that her current pressure is to high on her CPAP machine. "Feels like it's suffocating me." She would like to have this changed if possible. I reminded her that her setting of 5 is the lowest that we can place her pressure at.  CY - please advise. Thanks.

## 2016-02-08 ENCOUNTER — Telehealth: Payer: Self-pay | Admitting: *Deleted

## 2016-02-08 NOTE — Telephone Encounter (Signed)
Patient called stating that she would like to discuss the IV feraheme with MD Mosetta Putt. Patient would like to get feraheme sooner than later. Patient prefers to be contacted on home phone number. Message sent to MD Mosetta Putt.

## 2016-02-08 NOTE — Telephone Encounter (Signed)
I called pt back and discussed her lab results from 01/28/16, which showed no anemia and good iron level. She complains about fatigue, which I don't think it's related to her anemia. i encourage her to discussed with her PCP about her fatigue and other symptoms to see if it's related to her other medical comorbidities. She voiced good understanding and agrees with the plan.  Malachy Mood  02/08/2016

## 2016-02-18 DIAGNOSIS — B1920 Unspecified viral hepatitis C without hepatic coma: Secondary | ICD-10-CM | POA: Insufficient documentation

## 2016-02-24 ENCOUNTER — Telehealth: Payer: Self-pay | Admitting: Internal Medicine

## 2016-02-24 NOTE — Telephone Encounter (Signed)
Called and spoke to pt. Pt states her SOB has worsened and is requesting an appt after 4/21. Appt made with TP on 4/24 and advised pt to seek emergency help if s/s worsen. Pt verbalized understanding and denied any further questions or concerns at this time.

## 2016-02-28 ENCOUNTER — Ambulatory Visit: Payer: Medicare HMO | Admitting: Internal Medicine

## 2016-03-02 ENCOUNTER — Ambulatory Visit: Payer: Medicare HMO | Admitting: Internal Medicine

## 2016-03-06 ENCOUNTER — Encounter: Payer: Self-pay | Admitting: Adult Health

## 2016-03-06 ENCOUNTER — Ambulatory Visit (INDEPENDENT_AMBULATORY_CARE_PROVIDER_SITE_OTHER): Payer: Medicare HMO | Admitting: Adult Health

## 2016-03-06 VITALS — BP 140/88 | HR 83 | Temp 98.5°F | Ht 61.0 in | Wt 225.0 lb

## 2016-03-06 DIAGNOSIS — J479 Bronchiectasis, uncomplicated: Secondary | ICD-10-CM | POA: Diagnosis not present

## 2016-03-06 DIAGNOSIS — G4733 Obstructive sleep apnea (adult) (pediatric): Secondary | ICD-10-CM

## 2016-03-06 DIAGNOSIS — J4540 Moderate persistent asthma, uncomplicated: Secondary | ICD-10-CM | POA: Diagnosis not present

## 2016-03-06 DIAGNOSIS — R0600 Dyspnea, unspecified: Secondary | ICD-10-CM

## 2016-03-06 DIAGNOSIS — R0689 Other abnormalities of breathing: Secondary | ICD-10-CM

## 2016-03-06 DIAGNOSIS — J45909 Unspecified asthma, uncomplicated: Secondary | ICD-10-CM | POA: Insufficient documentation

## 2016-03-06 MED ORDER — BUDESONIDE-FORMOTEROL FUMARATE 160-4.5 MCG/ACT IN AERO: 2.0000 | INHALATION_SPRAY | Freq: Two times a day (BID) | RESPIRATORY_TRACT | Status: DC

## 2016-03-06 NOTE — Assessment & Plan Note (Signed)
Not optimally controlled    Plan  Change Advair to Symbicort 160/4.20mcg 2 puffs Twice daily  -rinse after use.  Set up CT chest in 3 months as planned  follow up Dr. Marchelle Gearing in 3 months with PFT and As needed

## 2016-03-06 NOTE — Patient Instructions (Addendum)
Change Advair to Symbicort 160/4.26mcg 2 puffs Twice daily  -rinse after use.  Call back if you change your mind on CPAP .  Set up CT chest in 3 months as planned  follow up Dr. Marchelle Gearing in 3 months with PFT and As needed

## 2016-03-06 NOTE — Assessment & Plan Note (Signed)
CPAP intolerant , pt education on OSA but pt cont to decline .  Will arrange pick up of CPAP from DME .  follow up Dr. Marchelle Gearing in 3 months with PFT and As needed

## 2016-03-06 NOTE — Progress Notes (Signed)
Subjective:    Patient ID: Anita Gonzalez, female    DOB: Oct 25, 1946, 70 y.o.   MRN: [id number]  HPI 70 yo female with mild OSA , morbid obesity and Asthma   03/06/2016 Follow up : OSA and Asthma  Patient returns for 3-month follow up .  Pt was started on CPAP for mild OSA but says she can only wear for 64m to 1 hr sometimes. She does not want to keep this machine and wants it picked up. We discussed changing mask, pressure setting but she says she is not going to wear this machine.   Says her asthma is not as good. Feels she gets some cough and wheezing on/off. Not sure if pollen and change in weather are causing this. Remains on Advair Twice daily   Pt had CT cehst in 04/2015 that showed mild diffuse bronchiectasis and few areas of GG attenuation. She has upcoming CT chest planned in 3 months.  We discussed repeat PFT at this time.   She denies chest pain, orthopnea, edema or hemoptysis    TEST   04/2015 CT chest that showed mild diffuse bronchiectasis, very mild groundglass attenuation. Atherosclerosis. No evidence of interstitial lung disease. 6/ 2016  PFT  shows restriction with a low diffusing capacity. FEV1 was 1.20 L, 72%, ratio 88, positive broncho-dilator response. Diffusing capacity 35% (PFTdid show patient had difficulty with DLCO component) 2-D echo showed an EF of 60-65%,, grade 1 diastolic dysfunction.      Past Medical History  Diagnosis Date  . Diabetes mellitus without complication (HCC)   . Asthma   . Arthritis   . Hypertension   . Rheumatoid arthritis (HCC) 1996  . GERD (gastroesophageal reflux disease)   . Anemia   . Hay fever   . Bronchitis    Current Outpatient Prescriptions on File Prior to Visit  Medication Sig Dispense Refill  . albuterol (PROVENTIL HFA;VENTOLIN HFA) 108 (90 BASE) MCG/ACT inhaler Inhale 2 puffs into the lungs every 6 (six) hours as needed for wheezing or shortness of breath (wheezing).     . bumetanide (BUMEX) 2 MG tablet Take 2 mg by  mouth 2 (two) times a week. Monday and Thursday.    . clobetasol (TEMOVATE) 0.05 % GEL Apply 1 application topically 2 (two) times daily. 30 each 0  . cyanocobalamin 1000 MCG tablet Take 100 mcg by mouth once a week. Monday    . cyclobenzaprine (FLEXERIL) 10 MG tablet Take 10 mg by mouth daily.     . ergocalciferol (VITAMIN D2) 50000 UNITS capsule Take 50,000 Units by mouth once a week. Thursday.    . ferrous sulfate 325 (65 FE) MG tablet Take 325 mg by mouth 2 (two) times daily with a meal.     . fluticasone (FLONASE) 50 MCG/ACT nasal spray Place 1 spray into both nostrils daily as needed for allergies or rhinitis.    . Fluticasone-Salmeterol (ADVAIR) 500-50 MCG/DOSE AEPB Inhale 2 puffs into the lungs daily. (Patient taking differently: Inhale 2 puffs into the lungs 2 (two) times daily. ) 3 each 0  . folic acid (FOLVITE) 1 MG tablet Take 1 mg by mouth daily.    Marland Kitchen ipratropium-albuterol (DUONEB) 0.5-2.5 (3) MG/3ML SOLN Inhale 3 mLs into the lungs every 6 (six) hours.    Marland Kitchen loperamide (IMODIUM A-D) 2 MG tablet Take 2 mg by mouth 4 (four) times daily as needed for diarrhea or loose stools.    . meloxicam (MOBIC) 7.5 MG tablet Take 15 mg by mouth  daily.     . metFORMIN (GLUCOPHAGE) 500 MG tablet Take 500 mg by mouth 2 (two) times daily.    . metoprolol tartrate (LOPRESSOR) 25 MG tablet Take 1 tablet (25 mg total) by mouth 2 (two) times daily. (Patient taking differently: Take 25 mg by mouth 3 (three) times daily. ) 60 tablet 0  . montelukast (SINGULAIR) 10 MG tablet Take 10 mg by mouth at bedtime.    . Multiple Vitamin (MULTIVITAMIN) tablet Take 1 tablet by mouth daily. With iron.    Marland Kitchen omeprazole (PRILOSEC) 20 MG capsule Take 20 mg by mouth daily.    . potassium chloride (K-DUR,KLOR-CON) 10 MEQ tablet Take 10 mEq by mouth every other day.     . simvastatin (ZOCOR) 20 MG tablet Take 20 mg by mouth every evening.     . sodium chloride HYPERTONIC 3 % nebulizer solution Take 3 mLs by nebulization every 6  (six) hours.    . valsartan (DIOVAN) 80 MG tablet Take 1 tablet by mouth daily.    . verapamil (VERELAN PM) 360 MG 24 hr capsule Take 1 capsule (360 mg total) by mouth daily. 30 capsule 0  . lidocaine (XYLOCAINE) 5 % ointment Apply 1 application topically 3 (three) times daily as needed (joint pain). Reported on 03/06/2016    . senna (SENOKOT) 8.6 MG TABS tablet Take 8.6 mg by mouth 2 (two) times daily as needed for mild constipation. Reported on 03/06/2016    . temazepam (RESTORIL) 15 MG capsule 1 or 2 AT BEDTIME FOR SLEEP IF NEEDED (Patient not taking: Reported on 03/06/2016) 30 capsule 0   No current facility-administered medications on file prior to visit.     Review of Systems Constitutional:   No  weight loss, night sweats,  Fevers, chills,  +fatigue, or  lassitude.  HEENT:   No headaches,  Difficulty swallowing,  Tooth/dental problems, or  Sore throat,                No sneezing, itching, ear ache,  +nasal congestion, post nasal drip,   CV:  No chest pain,  Orthopnea, PND, swelling in lower extremities, anasarca, dizziness, palpitations, syncope.   GI  No heartburn, indigestion, abdominal pain, nausea, vomiting, diarrhea, change in bowel habits, loss of appetite, bloody stools.   Resp:   No chest wall deformity  Skin: no rash or lesions.  GU: no dysuria, change in color of urine, no urgency or frequency.  No flank pain, no hematuria   MS:  No joint pain or swelling.  No decreased range of motion.  No back pain.  Psych:  No change in mood or affect. No depression or anxiety.  No memory loss.         Objective:   Physical Exam  Filed Vitals:   03/06/16 1611  BP: 140/88  Pulse: 83  Temp: 98.5 F (36.9 C)  TempSrc: Oral  Height: 5\' 1"  (1.549 m)  Weight: 225 lb (102.059 kg)  SpO2: 98%  Body mass index is 42.54 kg/(m^2).   GEN: A/Ox3; pleasant , NAD, morbidly obese   HEENT:  [place]/AT,  EACs-clear, TMs-wnl, NOSE-clear, THROAT-clear, no lesions, no postnasal drip or  exudate noted. Class 2-3 MP airway   NECK:  Supple w/ fair ROM; no JVD; normal carotid impulses w/o bruits; no thyromegaly or nodules palpated; no lymphadenopathy.  RESP  Decreased BS in bases , faint exp wheeze on forced expiration vs psuedowheeze no accessory muscle use, no dullness to percussion  CARD:  RRR,  no m/r/g  , tr  peripheral edema, pulses intact, no cyanosis or clubbing.  GI:   Soft & nt; nml bowel sounds; no organomegaly or masses detected.  Musco: Warm bil, no deformities or joint swelling noted.   Neuro: alert, no focal deficits noted.    Skin: Warm, no lesions or rashes  Mitsuo Budnick NP-C  [facility]  03/06/2016       Assessment & Plan:

## 2016-03-07 ENCOUNTER — Telehealth: Payer: Self-pay | Admitting: Adult Health

## 2016-03-07 ENCOUNTER — Other Ambulatory Visit: Payer: Self-pay | Admitting: Adult Health

## 2016-03-07 ENCOUNTER — Ambulatory Visit: Payer: Medicare HMO | Admitting: Internal Medicine

## 2016-03-07 DIAGNOSIS — J479 Bronchiectasis, uncomplicated: Secondary | ICD-10-CM

## 2016-03-07 DIAGNOSIS — G4733 Obstructive sleep apnea (adult) (pediatric): Secondary | ICD-10-CM

## 2016-03-07 HISTORY — DX: Bronchiectasis, uncomplicated: J47.9

## 2016-03-07 NOTE — Assessment & Plan Note (Signed)
Follow up CT chest in 3 months  Check PFT on return in 3 months

## 2016-03-07 NOTE — Telephone Encounter (Signed)
Per verbal order from TP after reviewing CPAP download from 12/19/15-01/17/16 D/c CPAP Send order to DME to pick up  Order has been placed.  Called spoke with pt. Informed her of TP's recs. She voiced understanding and had no further questions. Nothing further needed.

## 2016-03-21 ENCOUNTER — Telehealth: Payer: Self-pay | Admitting: Internal Medicine

## 2016-03-21 DIAGNOSIS — J4540 Moderate persistent asthma, uncomplicated: Secondary | ICD-10-CM

## 2016-03-21 NOTE — Telephone Encounter (Signed)
Patient states that she needs a portable nebulizer, she is leaving to go out of town on Friday, 03/24/16.   DME: [facility]  Dr. Marchelle Gearing, ok to order portable neb? Please advise.

## 2016-03-21 NOTE — Telephone Encounter (Signed)
Ok to order portable neb

## 2016-03-22 ENCOUNTER — Encounter: Payer: Self-pay | Admitting: Adult Health

## 2016-03-22 ENCOUNTER — Other Ambulatory Visit: Payer: Self-pay | Admitting: *Deleted

## 2016-03-22 DIAGNOSIS — D509 Iron deficiency anemia, unspecified: Secondary | ICD-10-CM

## 2016-03-22 NOTE — Telephone Encounter (Signed)
Spoke with pt and advised of MR's approval of order for portable neb machine. Order placed with DME. Nothing further needed.

## 2016-03-23 ENCOUNTER — Telehealth: Payer: Self-pay | Admitting: Internal Medicine

## 2016-03-23 ENCOUNTER — Other Ambulatory Visit: Payer: Medicare HMO

## 2016-03-23 ENCOUNTER — Telehealth: Payer: Self-pay | Admitting: Hematology

## 2016-03-23 NOTE — Telephone Encounter (Signed)
Called spoke with pt. She states that she needs that [facility] told her that she will need a rx for the neb meds in order for [facility] to give her the neb machine. I explained to her that the order for the machine was placed on 03/22/16. When I verified the duoneb as her neb medication she keep saying that she takes albuterol. I explained to her that albuterol neb was not on her medication list. She request that I call [facility] to verify what is exactly needed. I explained to her that I would have to call in the morning due to [facility]'s hours. She voiced understanding and had no further questions.  Will hold in triage

## 2016-03-23 NOTE — Telephone Encounter (Signed)
pt called to resched lab on 5/11... asthma problems and not traveling said she couldnt come in until 5/18... lab added to 5/18

## 2016-03-28 NOTE — Telephone Encounter (Signed)
ATC [facility], office is closed for lunch.  Wcb.

## 2016-03-29 ENCOUNTER — Telehealth: Payer: Self-pay | Admitting: *Deleted

## 2016-03-29 NOTE — Telephone Encounter (Signed)
atc X2, line rang to fast busy signal. Wcb.  

## 2016-03-29 NOTE — Telephone Encounter (Signed)
Patient called and states after she charged her oxygen tank last night - nothing is coming out. She can be reached at [phone]

## 2016-03-29 NOTE — Telephone Encounter (Signed)
Called [facility] to follow up on what nebs pt receives through them, was forwarded to pharmacy dept, states that they do not have pt in their pharmacy system. lmtcb X1 for pt.

## 2016-03-29 NOTE — Telephone Encounter (Signed)
"  I'm in [place].  My father is in the hospital and may not make it through the night.  Please cancel tomorrow's appointments.  Schedule me for next Monday or Tuesday.."  Asked about funeral arrangements.   Decided to call upon her return to rescheduled appointments.  Appointments cancelled at this time.

## 2016-03-29 NOTE — Telephone Encounter (Signed)
LMTCB

## 2016-03-30 ENCOUNTER — Ambulatory Visit: Payer: Medicare HMO

## 2016-03-30 ENCOUNTER — Other Ambulatory Visit: Payer: Medicare HMO

## 2016-03-30 ENCOUNTER — Ambulatory Visit: Payer: Medicare HMO | Admitting: Hematology

## 2016-03-30 NOTE — Telephone Encounter (Signed)
[phone], pt cb

## 2016-03-30 NOTE — Telephone Encounter (Signed)
Called home number and NA Called mobile and told to call pt at [phone]

## 2016-03-30 NOTE — Telephone Encounter (Signed)
Called [facility] at spoke with [place] who reported that they actually have a ticket out for the patient and someone went to her home today but they were told pt is out of town.  They tried calling patient at the two [phone] numbers in this message but had to LM's.  Offered [phone] to [facility] to have someone give patient a call to troubleshoot her oxygen.  Called spoke with patient who reported that she has been in [place] since 5.14.17 and will return 5.22.17 to attend her father's funeral.  Pt stated that something fell off her oxygen, but she was able to tape it back on and her O2 worked after this.  However, today after charging her oxygen it is not working AT ALL.  Pt denies any acute distress and has a pulse oximeter so that she may keep a check on her sats.  Advised pt to keep a close watch on her O2 sats and call the office if she notices that her levels are dropping.  Pt voiced her understanding.  Advised will call [facility] and ask them to call her back ASAP since her O2 tank is not working.  Pt will call back if she does not hear from them by lunch-time tomorrow.  Called [facility] and spoke with [place] again who reported she will have someone contact pt at [phone] ASAP to troubleshoot. Nothing further needed at this time; will sign off.

## 2016-03-30 NOTE — Telephone Encounter (Signed)
ATC pt, line busy, wcb 

## 2016-03-31 ENCOUNTER — Telehealth: Payer: Self-pay | Admitting: Internal Medicine

## 2016-03-31 NOTE — Telephone Encounter (Signed)
Pt's daughter called in on 3-way call with pt and was asking why pt had not received a new O2 tank yet. I advised her of what Tawanna Cooler @ [facility] had said and that someone from a local [facility] office should be contacting the pt today to switch out her tank. The daughter was upset that this had not already been done. I then asked it the pt was still going to call EMS or if someone would be taking her to the ED due to her low O2 sats, to which the daughter replied that if all she needs is a new tank then she shouldn't have to go to the ED. I explained to the daughter that the pt's O2 levels, if truly accurate at 53-56%, were CRITICALLY low and that she needs immediate medical attention. I asked the daughter if the pt was going to call EMS or if someone was there to take her to the ED and the daughter said she was about 30 minutes away so she would see when she got to the pt. I again advised that with O2 levels that low it was not a good idea to wait as there could be disastrous consequences.To which the daughter replied she doesn't have a helicopter to get there any faster so she will see about taking her once she gets there. I AGAIN advised that the pt needs to call EMS/911 and the daughter said that wasn't necessary.   Nothing further at this time. Closing message.

## 2016-03-31 NOTE — Telephone Encounter (Signed)
Spoke with pt and she states that her O2 has dropped to 53-56% without her O2. She does report that her fingers are cold to the touch. She denies any lightheadedness or other hypoxic symptoms. Spoke with JJ and agrees that pt needs to call EMS for evaluation/treatment/transport as her O2 levels are critically low. Pt agrees to call EMS. Advised pt that we will call her later today to check on her.   Spoke with Tawanna Cooler @ [facility] and he states that pt did not advise them she was going out of town so they could contact a local branch and let them know where she would be if she needed anything. [facility] has sent all of her information to the local branch in [place] and they should be contacting pt today to get her a new tank. Advised him that pt has reports low sats and we advised her to call EMS. He noted that in her account.   Holding in triage to contact pt later in the day and check on her.

## 2016-04-06 ENCOUNTER — Other Ambulatory Visit: Payer: Self-pay | Admitting: Hematology

## 2016-04-06 ENCOUNTER — Other Ambulatory Visit: Payer: Medicare HMO

## 2016-04-06 ENCOUNTER — Encounter: Payer: Medicare HMO | Admitting: Hematology

## 2016-04-06 ENCOUNTER — Ambulatory Visit: Payer: Medicare HMO

## 2016-04-06 DIAGNOSIS — D509 Iron deficiency anemia, unspecified: Secondary | ICD-10-CM

## 2016-04-07 ENCOUNTER — Telehealth: Payer: Self-pay | Admitting: Hematology

## 2016-04-07 NOTE — Progress Notes (Signed)
This encounter was created in error - please disregard.

## 2016-04-07 NOTE — Telephone Encounter (Signed)
cld pt to adv Dr Mosetta Putt wanted pt to r/s no show fera appt-adv pt to call and r/s appt

## 2016-04-13 ENCOUNTER — Other Ambulatory Visit: Payer: Medicare HMO

## 2016-04-21 ENCOUNTER — Ambulatory Visit: Payer: Medicare HMO | Admitting: Internal Medicine

## 2016-05-22 ENCOUNTER — Other Ambulatory Visit: Payer: Self-pay | Admitting: Internal Medicine

## 2016-05-22 DIAGNOSIS — Z1231 Encounter for screening mammogram for malignant neoplasm of breast: Secondary | ICD-10-CM

## 2016-05-24 ENCOUNTER — Other Ambulatory Visit: Payer: Self-pay | Admitting: *Deleted

## 2016-05-30 ENCOUNTER — Telehealth: Payer: Self-pay | Admitting: Hematology

## 2016-05-30 ENCOUNTER — Ambulatory Visit (INDEPENDENT_AMBULATORY_CARE_PROVIDER_SITE_OTHER): Payer: Medicare HMO | Admitting: Internal Medicine

## 2016-05-30 ENCOUNTER — Encounter: Payer: Self-pay | Admitting: Internal Medicine

## 2016-05-30 VITALS — BP 122/80 | HR 69 | Ht 61.0 in | Wt 223.0 lb

## 2016-05-30 DIAGNOSIS — J45901 Unspecified asthma with (acute) exacerbation: Secondary | ICD-10-CM

## 2016-05-30 DIAGNOSIS — R0689 Other abnormalities of breathing: Secondary | ICD-10-CM | POA: Diagnosis not present

## 2016-05-30 DIAGNOSIS — R0600 Dyspnea, unspecified: Secondary | ICD-10-CM

## 2016-05-30 LAB — PULMONARY FUNCTION TEST
DL/VA % PRED: 147 %
DL/VA: 6.47 ml/min/mmHg/L
DLCO COR: 14.85 ml/min/mmHg
DLCO UNC % PRED: 68 %
DLCO UNC: 13.8 ml/min/mmHg
DLCO cor % pred: 73 %
FEF 25-75 PRE: 1.87 L/s
FEF 25-75 Post: 1.88 L/sec
FEF2575-%CHANGE-POST: 0 %
FEF2575-%PRED-POST: 121 %
FEF2575-%PRED-PRE: 120 %
FEV1-%Change-Post: 0 %
FEV1-%PRED-POST: 63 %
FEV1-%PRED-PRE: 63 %
FEV1-PRE: 1.02 L
FEV1-Post: 1.03 L
FEV1FVC-%CHANGE-POST: 0 %
FEV1FVC-%Pred-Pre: 112 %
FEV6-%Change-Post: 0 %
FEV6-%PRED-PRE: 57 %
FEV6-%Pred-Post: 57 %
FEV6-Post: 1.16 L
FEV6-Pre: 1.15 L
FEV6FVC-%CHANGE-POST: 1 %
FEV6FVC-%Pred-Post: 104 %
FEV6FVC-%Pred-Pre: 102 %
FVC-%Change-Post: 0 %
FVC-%Pred-Post: 55 %
FVC-%Pred-Pre: 55 %
FVC-PRE: 1.16 L
FVC-Post: 1.16 L
PRE FEV1/FVC RATIO: 88 %
Post FEV1/FVC ratio: 89 %
Post FEV6/FVC ratio: 100 %
Pre FEV6/FVC Ratio: 99 %
RV % PRED: 74 %
RV: 1.51 L
TLC % pred: 61 %
TLC: 2.84 L

## 2016-05-30 MED ORDER — DOXYCYCLINE HYCLATE 100 MG PO TABS: 100.0000 mg | ORAL_TABLET | Freq: Two times a day (BID) | ORAL | Status: DC

## 2016-05-30 NOTE — Telephone Encounter (Signed)
Spoke with patient re appointments for 8/2 and 8/9.

## 2016-05-30 NOTE — Patient Instructions (Addendum)
ICD-9-CM ICD-10-CM   1. Dyspnea and respiratory abnormality 786.09 R06.00     R06.89   2. Asthma exacerbation 493.92 J45.901     Respect desire to avoid prednisone Take doxycycline 100mg  po twice daily x 5 days; take after meals and avoid sunlight Use your nebs and o2 with exertion/sleep as before Will discuss with Dr about getting right heart cath  folllowup 4-6 months or sooner if needed

## 2016-05-30 NOTE — Progress Notes (Signed)
Subjective:     Patient ID: Anita Gonzalez, female   DOB: [birth year], 70 y.o.   MRN: [id number]  HPI    PCP OSEI-BONSU,GEORGE, MD  HPI  IOV 04/06/2015  Chief Complaint  Patient presents with  . Pulmonary Consult    Pt referred by Raelyn Number, PA for O2 dependence.    70 year old female moved from [place] to [place] in 2016. Has a history of type 2 diabetes, hypertension, hyperlipidemia, asthma not otherwise specified, rheumatoid arthritis, former smoker and significant obesityn. The history if from review of the outside records from 02/03/2015 and review of [facility] medical records from February 2016. And in talking to the patient.  At baseline she is living in [place], [place]. She carries a long-standing diagnosis of asthma for which she takes possibly Advair on a non-compliant basis and albuterol as needed. Because of this she has chronic cough and shortness of breath with exertion and relieved by rest. She moved into December 2015 to [place], [place] to be close to her daughter. Then around 01/06/2015 she developed acute worsening of cough with mucus production and was hospitalized under the tried [employer] for 5 days. Chest x-ray to admission documented below showed right lower lobe consolidation. She was treated as community acquired pneumonia Since then she's been placed on oxygen at discharge. Prior to that she was never on oxygen. She is unaware if she was having exertional hypoxemia prior to this admission. Currently she feels that the pneumonia has resolved and she is back to her baseline health. In the office today when she exerted she did desaturate to 60%. However she does not feel this.   In terms of her past medical history - Smoking: 18.5 prior smoking history  - RA: REports RA NOS - not able to give me details. Not on immune modulator. Not seen rheumatlogist in [place] - ASthma: as above. LAst admit 2 years ago due to aeasthma - ILD -  denies =-OSA: denies testing or diagnosis but appears to be high risk for it  Chest x-ray from 01/06/2015 personally visualized image shows right lower lobe consolidation. Agree with the formal interpretation  Outside lab tests from primary care physician reviewed dated 02/03/2015 ESR 34, ANA negative, rheumatoid factor normal, normal TSH creatinine 0.9 mg percent. Abnormal values include hemoglobin of 9 g percent [in the hospital hemoglobin fluctuated between 7.5 g percent and 8.4 g percent. Most recent hemoglobin 03/26/2015 shows improvement of 10.6 g percent.   has a past medical history of Diabetes mellitus without complication; Asthma; Arthritis; Hypertension; Rheumatoid arthritis (1996); GERD (gastroesophageal reflux disease); Anemia; and Hay fever.   reports that she quit smoking about 4 years ago. Her smoking use included Cigarettes. She has a 18.75 pack-year smoking history. She has never used smokeless tobacco.   04/23/2015 Follow up  Patient returns for two-week follow-up She was seen for new pulmonary consult for dyspnea and hypoxia. Patient was admitted in February for pneumonia and treated with anabiotic's. She was placed on oxygen at discharge due to persistent desaturations. She is a former smoker. Patient was set up for a CT chest that showed mild diffuse bronchiectasis, very mild groundglass attenuation. Atherosclerosis. No evidence of interstitial lung disease. PFT shows restriction with a low diffusing capacity. FEV1 was 1.20 L, 72%, ratio 88, positive broncho-dilator response. Diffusing capacity 35% (PFTdid show patient had difficulty with DLCO component) 2-D echo showed an EF of 60-65%,, grade 1 diastolic dysfunction. Patient says she was seen in [place] by cardiologist but  has not establish here in [place] . Patient is currently on oxygen 2 L with activity and at bedtime. Has been told that she snores and does have some daytime sleepiness. Last visit. Patient had  desaturations with ambulation.  REC Sleep study VQ Cards referral   OV 07/20/2015  Chief Complaint  Patient presents with  . Follow-up    Pt here after OV with TP. Pt had sleep study, VQ scan, and f/u with cards. Pt denies change in breathing. Pt denies cough and CP/tightness.    Fu dyspnea, hypoxemia  - Here for follow-up to review test results. In talking to her I could not figure out if she is better or not. It appears that she might still have baseline shortness of breath. She uses oxygen at random. Unclear how much Advair she is using or how regular she is with it. Below the test results reviewed at this visit  - VQ scan 04/30/2015: Very low probability of pulmonary embolism  - Nuclear medicine myocardial perfusion study 05/07/2015: Normal study with normal stress EKG, ejection fraction 83% an extremely low risk study  - Split night sleep study 06/13/2015: t. Personally visualized image and reports. It appears she has had desaturations and an apnea hypopnea events. Final report by Dr. Kara Mead notes overall 11 apnea hypopnea events per hour. Significant nocturnal desaturations. Final diagnosis is mild obstructive sleep apnea without central sleep apnea and significant nocturnal hypoxemia. Recommendation is for weight loss/CPAP therapy and position therapy during sleep. Loud snoring reported during sleep  FEno - 30ppb and somehwat elevated today 07/20/2015   OV 03/06/16 70 yo female with mild OSA , morbid obesity and Asthma   03/06/2016 Follow up : OSA and Asthma  Patient returns for 26-monthfollow up .  Pt was started on CPAP for mild OSA but says she can only wear for 396mo 1 hr sometimes. She does not want to keep this machine and wants it picked up. We discussed changing mask, pressure setting but she says she is not going to wear this machine.   Says her asthma is not as good. Feels she gets some cough and wheezing on/off. Not sure if pollen and change in weather are  causing this. Remains on Advair Twice daily   Pt had CT cehst in 04/2015 that showed mild diffuse bronchiectasis and few areas of GG attenuation. She has upcoming CT chest planned in 3 months.  We discussed repeat PFT at this time.   She denies chest pain, orthopnea, edema or hemoptysis    TEST  04/2015 CT chest that showed mild diffuse bronchiectasis, very mild groundglass attenuation. Atherosclerosis. No evidence of interstitial lung disease. 6/ 2016 PFT shows restriction with a low diffusing capacity. FEV1 was 1.20 L, 72%, ratio 88, positive broncho-dilator response. Diffusing capacity 35% (PFTdid show patient had difficulty with DLCO component) 2-D echo showed an EF of 60-65%,, grade 1 diastolic dysfunction.     OV 05/30/2016  Chief Complaint  Patient presents with  . Follow-up    Review PFT. Pt reports that SOB is worse since last OV.     Here to review pulmonary function test. 05/30/2016 FEV1 1.02 L/62% FVC 1.16/55% ratio of 88 suggestive of restriction. No broncho-dilator response. Total lung capacity 2.8 L/61% consistent with restriction DLCO 14.85/73% and only mildly low. Off note last visit she was switched to Symbicort from her Advair. Hypoxemic continues. She is on iron infusions through hematology Department. Last echocardiogram 0526/20/35591 diastolic dysfunction. At this visit  she is feeling more short of breath than in the past. Unclear for how long. Review of the phone note shows she has significant desaturations into the 50s very easily without oxygen     has a past medical history of Diabetes mellitus without complication ([facility]); Asthma; Arthritis; Hypertension; Rheumatoid arthritis ([facility]) (1996); GERD (gastroesophageal reflux disease); Anemia; Hay fever; and Bronchitis.   reports that she quit smoking about 5 years ago. Her smoking use included Cigarettes. She has a 18.75 pack-year smoking history. She has never used smokeless tobacco.  Past Surgical History   Procedure Laterality Date  . Cesarean section    . Hernia repair    . Hemorrhoid surgery    . Cesarean section    . Hernia repair    . Abdominal hysterectomy    . Esophagogastroduodenoscopy (egd) with propofol N/A 04/02/2015    Procedure: ESOPHAGOGASTRODUODENOSCOPY (EGD) WITH PROPOFOL;  Surgeon: Carol Ada, MD;  Location: [facility];  Service: Endoscopy;  Laterality: N/A;  . Colonoscopy with propofol N/A 04/02/2015    Procedure: COLONOSCOPY WITH PROPOFOL;  Surgeon: Carol Ada, MD;  Location: [facility];  Service: Endoscopy;  Laterality: N/A;    Allergies  Allergen Reactions  . Fish Allergy Anaphylaxis, Hives and Swelling  . Peanuts [Peanut Oil] Anaphylaxis, Hives, Itching and Swelling  . Hydrocodone Nausea Only  . Iodinated Diagnostic Agents Hives, Itching and Swelling  . Other     Hypersensitive to perfumes, colognes, powders, lotions, room sprays  . Oxycodone Nausea Only and Other (See Comments)    Sweats and dizziness  . Penicillins Hives, Itching and Swelling    Has patient had a PCN reaction causing immediate rash, facial/tongue/throat swelling, SOB or lightheadedness with hypotension: Yes Has patient had a PCN reaction causing severe rash involving mucus membranes or skin necrosis: Yes Has patient had a PCN reaction that required hospitalization Yes Has patient had a PCN reaction occurring within the last 10 years: No If all of the above answers are "NO", then may proceed with Cephalosporin use.   Marland Kitchen Percocet [Oxycodone-Acetaminophen] Nausea Only and Other (See Comments)    Raised blood pressure, Sweats, nervous,dizziness    Immunization History  Administered Date(s) Administered  . Influenza Split 09/15/2015    Family History  Problem Relation Age of Onset  . Diabetes Mother   . Heart failure Mother   . Throat cancer Brother     throat cancer      Current outpatient prescriptions:  .  albuterol (PROVENTIL HFA;VENTOLIN HFA) 108 (90 BASE) MCG/ACT inhaler,  Inhale 2 puffs into the lungs every 6 (six) hours as needed for wheezing or shortness of breath (wheezing). , Disp: , Rfl:  .  budesonide-formoterol (SYMBICORT) 160-4.5 MCG/ACT inhaler, Inhale 2 puffs into the lungs 2 (two) times daily., Disp: 1 Inhaler, Rfl: 6 .  bumetanide (BUMEX) 2 MG tablet, Take 2 mg by mouth 2 (two) times a week. Monday and Thursday., Disp: , Rfl:  .  clobetasol (TEMOVATE) 0.05 % GEL, Apply 1 application topically 2 (two) times daily., Disp: 30 each, Rfl: 0 .  cyanocobalamin 1000 MCG tablet, Take 100 mcg by mouth once a week. Monday, Disp: , Rfl:  .  cyclobenzaprine (FLEXERIL) 10 MG tablet, Take 10 mg by mouth daily. , Disp: , Rfl:  .  ergocalciferol (VITAMIN D2) 50000 UNITS capsule, Take 50,000 Units by mouth once a week. Thursday., Disp: , Rfl:  .  ferrous sulfate 325 (65 FE) MG tablet, Take 325 mg by mouth 2 (two) times daily with  a meal. , Disp: , Rfl:  .  fluticasone (FLONASE) 50 MCG/ACT nasal spray, Place 1 spray into both nostrils daily as needed for allergies or rhinitis., Disp: , Rfl:  .  folic acid (FOLVITE) 1 MG tablet, Take 1 mg by mouth daily., Disp: , Rfl:  .  ipratropium-albuterol (DUONEB) 0.5-2.5 (3) MG/3ML SOLN, Inhale 3 mLs into the lungs every 6 (six) hours., Disp: , Rfl:  .  lidocaine (XYLOCAINE) 5 % ointment, Apply 1 application topically 3 (three) times daily as needed (joint pain). Reported on 03/06/2016, Disp: , Rfl:  .  loperamide (IMODIUM A-D) 2 MG tablet, Take 2 mg by mouth 4 (four) times daily as needed for diarrhea or loose stools., Disp: , Rfl:  .  meloxicam (MOBIC) 7.5 MG tablet, Take 15 mg by mouth daily. , Disp: , Rfl:  .  metFORMIN (GLUCOPHAGE) 500 MG tablet, Take 500 mg by mouth 2 (two) times daily., Disp: , Rfl:  .  Metoprolol Tartrate 75 MG TABS, Take 1 tablet by mouth daily., Disp: , Rfl:  .  montelukast (SINGULAIR) 10 MG tablet, Take 10 mg by mouth at bedtime., Disp: , Rfl:  .  Multiple Vitamin (MULTIVITAMIN) tablet, Take 1 tablet by mouth  daily. With iron., Disp: , Rfl:  .  omeprazole (PRILOSEC) 20 MG capsule, Take 20 mg by mouth daily., Disp: , Rfl:  .  potassium chloride (K-DUR,KLOR-CON) 10 MEQ tablet, Take 10 mEq by mouth every other day. , Disp: , Rfl:  .  senna (SENOKOT) 8.6 MG TABS tablet, Take 8.6 mg by mouth 2 (two) times daily as needed for mild constipation. Reported on 03/06/2016, Disp: , Rfl:  .  simvastatin (ZOCOR) 20 MG tablet, Take 20 mg by mouth every evening. , Disp: , Rfl:  .  sodium chloride HYPERTONIC 3 % nebulizer solution, Take 3 mLs by nebulization every 6 (six) hours., Disp: , Rfl:  .  temazepam (RESTORIL) 15 MG capsule, 1 or 2 AT BEDTIME FOR SLEEP IF NEEDED, Disp: 30 capsule, Rfl: 0 .  valsartan (DIOVAN) 80 MG tablet, Take 1 tablet by mouth daily., Disp: , Rfl:  .  verapamil (VERELAN PM) 360 MG 24 hr capsule, Take 1 capsule (360 mg total) by mouth daily., Disp: 30 capsule, Rfl: 0     Review of Systems     Objective:   Physical Exam  Constitutional: She is oriented to person, place, and time. She appears well-developed and well-nourished. No distress.  HENT:  Head: Normocephalic and atraumatic.  Right Ear: External ear normal.  Left Ear: External ear normal.  Mouth/Throat: Oropharynx is clear and moist. No oropharyngeal exudate.  Eyes: Conjunctivae and EOM are normal. Pupils are equal, round, and reactive to light. Right eye exhibits no discharge. Left eye exhibits no discharge. No scleral icterus.  Neck: Normal range of motion. Neck supple. No JVD present. No tracheal deviation present. No thyromegaly present.  Cardiovascular: Normal rate, regular rhythm, normal heart sounds and intact distal pulses.  Exam reveals no gallop and no friction rub.   No murmur heard. Pulmonary/Chest: Effort normal. No respiratory distress. She has wheezes. She has no rales. She exhibits no tenderness.  Abdominal: Soft. Bowel sounds are normal. She exhibits no distension and no mass. There is no tenderness. There is no  rebound and no guarding.  Musculoskeletal: Normal range of motion. She exhibits no edema or tenderness.  Lymphadenopathy:    She has no cervical adenopathy.  Neurological: She is alert and oriented to person, place, and time.  She has normal reflexes. No cranial nerve deficit. She exhibits normal muscle tone. Coordination normal.  Skin: Skin is warm and dry. No rash noted. She is not diaphoretic. No erythema. No pallor.  Psychiatric: She has a normal mood and affect. Her behavior is normal. Judgment and thought content normal.  Vitals reviewed.   Filed Vitals:   05/30/16 1243  BP: 122/80  Pulse: 69  Height: '5\' 1"'  (1.549 m)  Weight: 223 lb (101.152 kg)  SpO2: 90%    Estimated body mass index is 42.16 kg/(m^2) as calculated from the following:   Height as of this encounter: '5\' 1"'  (1.549 m).   Weight as of this encounter: 223 lb (101.152 kg).      Assessment:       ICD-9-CM ICD-10-CM   1. Dyspnea and respiratory abnormality 786.09 R06.00     R06.89   2. Asthma exacerbation 493.92 J45.901    Given the wheeze which could well be pseudo-wheeze will treat her for asthma exacerbation. She is refusing prednisone so this do with antibiotics. However given the significant desaturations despite the normal echo I think a right heart catheterization is warranted. Will reach out to Dr. Dorris Carnes    Plan:      Respect desire to avoid prednisone Take doxycycline 159m po twice daily x 5 days; take after meals and avoid sunlight Use your nebs and o2 with exertion/sleep as before Will discuss with Dr RHarrington Challengerabout getting right heart cath  folllowup 4-6 months or sooner if needed   Dr. MBrand Males M.D., FVirginia Hospital CenterC.P Pulmonary and Critical Care Medicine Staff Physician [facility] Pager: [phone] If no answer or between  15:00h - 7:00h: call [phone]  06/08/2016 3:55 PM

## 2016-05-30 NOTE — Progress Notes (Signed)
PFT done today. 

## 2016-06-08 ENCOUNTER — Telehealth: Payer: Self-pay | Admitting: Internal Medicine

## 2016-06-08 ENCOUNTER — Ambulatory Visit (INDEPENDENT_AMBULATORY_CARE_PROVIDER_SITE_OTHER)
Admission: RE | Admit: 2016-06-08 | Discharge: 2016-06-08 | Disposition: A | Discharge status: Home / Self Care | Payer: Medicare HMO | Source: Ambulatory Visit | Attending: Internal Medicine | Admitting: Internal Medicine

## 2016-06-08 ENCOUNTER — Encounter: Payer: Self-pay | Admitting: Internal Medicine

## 2016-06-08 ENCOUNTER — Ambulatory Visit (INDEPENDENT_AMBULATORY_CARE_PROVIDER_SITE_OTHER): Payer: Medicare HMO | Admitting: Internal Medicine

## 2016-06-08 VITALS — BP 176/77 | HR 61 | Ht 61.0 in | Wt 222.4 lb

## 2016-06-08 DIAGNOSIS — R0689 Other abnormalities of breathing: Secondary | ICD-10-CM

## 2016-06-08 DIAGNOSIS — R0600 Dyspnea, unspecified: Secondary | ICD-10-CM

## 2016-06-08 DIAGNOSIS — R002 Palpitations: Secondary | ICD-10-CM | POA: Diagnosis not present

## 2016-06-08 DIAGNOSIS — R2689 Other abnormalities of gait and mobility: Secondary | ICD-10-CM

## 2016-06-08 DIAGNOSIS — R918 Other nonspecific abnormal finding of lung field: Secondary | ICD-10-CM

## 2016-06-08 DIAGNOSIS — G4733 Obstructive sleep apnea (adult) (pediatric): Secondary | ICD-10-CM

## 2016-06-08 DIAGNOSIS — R29818 Other symptoms and signs involving the nervous system: Secondary | ICD-10-CM

## 2016-06-08 NOTE — Progress Notes (Signed)
Cardiology Office Note   Date:  06/08/2016   ID:  Anita Gonzalez, DOB 1946-09-24, MRN [id number]  PCP:  [name], MD  Cardiologist:   Dietrich Pates, MD   F/U of CAD    History of Present Illness: Anita Gonzalez is a 70 y.o. female with a history of CAD on CT scan, HTN, HL, DM   I sawsher in June 2016  Since seen she has been seen by [facility]  CT done today of lungs  She continues to be SOB with activity  Using oxygen Does note some palpitations    Denies CP She is dizzy at times while in bed when moves head.   No syncope       Outpatient Medications Prior to Visit  Medication Sig Dispense Refill  . albuterol (PROVENTIL HFA;VENTOLIN HFA) 108 (90 BASE) MCG/ACT inhaler Inhale 2 puffs into the lungs every 6 (six) hours as needed for wheezing or shortness of breath (wheezing).     . budesonide-formoterol (SYMBICORT) 160-4.5 MCG/ACT inhaler Inhale 2 puffs into the lungs 2 (two) times daily. 1 Inhaler 6  . bumetanide (BUMEX) 2 MG tablet Take 2 mg by mouth 2 (two) times a week. Monday and Thursday.    . clobetasol (TEMOVATE) 0.05 % GEL Apply 1 application topically 2 (two) times daily. 30 each 0  . cyanocobalamin 1000 MCG tablet Take 100 mcg by mouth once a week. Monday    . cyclobenzaprine (FLEXERIL) 10 MG tablet Take 10 mg by mouth daily.     . ergocalciferol (VITAMIN D2) 50000 UNITS capsule Take 50,000 Units by mouth once a week. Thursday.    . ferrous sulfate 325 (65 FE) MG tablet Take 325 mg by mouth 2 (two) times daily with a meal.     . fluticasone (FLONASE) 50 MCG/ACT nasal spray Place 1 spray into both nostrils daily as needed for allergies or rhinitis.    . folic acid (FOLVITE) 1 MG tablet Take 1 mg by mouth daily.    Marland Kitchen ipratropium-albuterol (DUONEB) 0.5-2.5 (3) MG/3ML SOLN Inhale 3 mLs into the lungs every 6 (six) hours.    Marland Kitchen loperamide (IMODIUM A-D) 2 MG tablet Take 2 mg by mouth 4 (four) times daily as needed for diarrhea or loose stools.    . meloxicam (MOBIC) 7.5 MG  tablet Take 15 mg by mouth daily.     . metFORMIN (GLUCOPHAGE) 500 MG tablet Take 500 mg by mouth 2 (two) times daily.    . Metoprolol Tartrate 75 MG TABS Take 1 tablet by mouth daily.    . montelukast (SINGULAIR) 10 MG tablet Take 10 mg by mouth at bedtime.    . Multiple Vitamin (MULTIVITAMIN) tablet Take 1 tablet by mouth daily. With iron.    Marland Kitchen omeprazole (PRILOSEC) 20 MG capsule Take 20 mg by mouth daily.    . potassium chloride (K-DUR,KLOR-CON) 10 MEQ tablet Take 10 mEq by mouth every other day.     . senna (SENOKOT) 8.6 MG TABS tablet Take 8.6 mg by mouth 2 (two) times daily as needed for mild constipation. Reported on 03/06/2016    . simvastatin (ZOCOR) 20 MG tablet Take 20 mg by mouth every evening.     . temazepam (RESTORIL) 15 MG capsule 1 or 2 AT BEDTIME FOR SLEEP IF NEEDED 30 capsule 0  . valsartan (DIOVAN) 80 MG tablet Take 1 tablet by mouth daily.    . verapamil (VERELAN PM) 360 MG 24 hr capsule Take 1 capsule (360 mg total) by mouth  daily. 30 capsule 0  . doxycycline (VIBRA-TABS) 100 MG tablet Take 1 tablet (100 mg total) by mouth 2 (two) times daily. Take after meals and avoid sunlight (Patient not taking: Reported on 06/08/2016) 10 tablet 0  . lidocaine (XYLOCAINE) 5 % ointment Apply 1 application topically 3 (three) times daily as needed (joint pain). Reported on 03/06/2016    . sodium chloride HYPERTONIC 3 % nebulizer solution Take 3 mLs by nebulization every 6 (six) hours.     No facility-administered medications prior to visit.      Allergies:   Fish allergy; Peanuts [peanut oil]; Hydrocodone; Iodinated diagnostic agents; Other; Oxycodone; Oxycodone-acetaminophen; Penicillins; Percocet [oxycodone-acetaminophen]; and Sulfa antibiotics   Past Medical History:  Diagnosis Date  . Anemia   . Arthritis   . Asthma   . Bronchitis   . Diabetes mellitus without complication ([facility])   . GERD (gastroesophageal reflux disease)   . Hay fever   . Hypertension   . Rheumatoid arthritis  ([facility]) 1996    Past Surgical History:  Procedure Laterality Date  . ABDOMINAL HYSTERECTOMY    . CESAREAN SECTION    . CESAREAN SECTION    . COLONOSCOPY WITH PROPOFOL N/A 04/02/2015   Procedure: COLONOSCOPY WITH PROPOFOL;  Surgeon: Jeani Hawking, MD;  Location: [facility];  Service: Endoscopy;  Laterality: N/A;  . ESOPHAGOGASTRODUODENOSCOPY (EGD) WITH PROPOFOL N/A 04/02/2015   Procedure: ESOPHAGOGASTRODUODENOSCOPY (EGD) WITH PROPOFOL;  Surgeon: Jeani Hawking, MD;  Location: [facility];  Service: Endoscopy;  Laterality: N/A;  . HEMORRHOID SURGERY    . HERNIA REPAIR    . HERNIA REPAIR       Social History:  The patient  reports that she quit smoking about 5 years ago. Her smoking use included Cigarettes. She has a 18.75 pack-year smoking history. She has never used smokeless tobacco. She reports that she drinks about 2.4 oz of alcohol per week . She reports that she does not use drugs.   Family History:  The patient's family history includes Diabetes in her mother; Heart failure in her mother; Throat cancer in her brother.    ROS:  Please see the history of present illness. All other systems are reviewed and  Negative to the above problem except as noted.    PHYSICAL EXAM: VS:  BP (!) 176/77   Pulse 61   Ht 5\' 1"  (1.549 m)   Wt 222 lb 6.4 oz (100.9 kg)   BMI 42.02 kg/m   GEN: Morbidly obese 70 yo in NAD HEENT: normal  Neck: no JVD, carotid bruits, or masses Cardiac: RRR; no murmurs, rubs, or gallops,no edema  Respiratory:  clear to auscultation bilaterally, normal work of breathing GI: soft, nontender, nondistended, + BS  No hepatomegaly  MS: no deformity Moving all extremities   Skin: warm and dry, no rash Neuro:  Strength and sensation are intact Psych: euthymic mood, full affect   EKG:  EKG is not ordered today.   Lipid Panel    Component Value Date/Time   CHOL 131 05/07/2015 1219   TRIG 79.0 05/07/2015 1219   HDL 41.30 05/07/2015 1219   CHOLHDL 3 05/07/2015 1219     VLDL 15.8 05/07/2015 1219   LDLCALC 74 05/07/2015 1219      Wt Readings from Last 3 Encounters:  06/08/16 222 lb 6.4 oz (100.9 kg)  05/30/16 223 lb (101.2 kg)  03/06/16 225 lb (102.1 kg)      ASSESSMENT AND PLAN:  1 CAD  I am not convinced of active angina  Need to review CT scan results  2.  HTN  High BP today  She has not taken her meds  Would continue  Has been good at other clinic visits  3.  HL  Continue meds   4  Dizziness Will refer to vestibular rehab  F/U in Feb.       Signed, Dietrich Pates, MD  06/08/2016 3:30 PM    [facility] [address] [place], [place], [place]  [address] Phone: [phone]; Fax: [phone]

## 2016-06-08 NOTE — Patient Instructions (Signed)
Your physician recommends that you continue on your current medications as directed. Please refer to the Current Medication list given to you today.  Your physician has recommended that you wear a holter monitor. Holter monitors are medical devices that record the heart's electrical activity. Doctors most often use these monitors to diagnose arrhythmias. Arrhythmias are problems with the speed or rhythm of the heartbeat. The monitor is a small, portable device. You can wear one while you do your normal daily activities. This is usually used to diagnose what is causing palpitations/syncope (passing out).  You have been referred to [facility]-neuro rehab  Your physician wants you to follow-up in: March 2018 with Dr. Tenny Craw.  You will receive a reminder letter in the mail two months in advance. If you don't receive a letter, please call our office to schedule the follow-up appointment.

## 2016-06-08 NOTE — Telephone Encounter (Addendum)
Hello Anita Gonzalez  has significant deaturations. ECHO last year was normal. Wondering if she needs right heart cath though is possible can call be from CT findings of lung but it seemed a bit out of proprotion. Thoughts?  Thanks  Dr. Kalman Shan, M.D., [facility].C.P Pulmonary and Critical Care Medicine Staff Physician [facility] Pulmonary and Critical Care Pager: [phone], If no answer or between  15:00h - 7:00h: call [phone]  06/08/2016 4:04 PM

## 2016-06-08 NOTE — Telephone Encounter (Signed)
NEw Message  Pt was seen today- stated that she was planning on picking up transportation paperwork- stated that she can not make it back to the office and would like the information faxed to the number on the form. Please advise.

## 2016-06-08 NOTE — Telephone Encounter (Signed)
Called patient back.  She states that she needs paper signed by this facility to verify that she was here today.  I advised I will check for the paper, sign off that she was here and fax to the number on the form.  Will look for the form and fax.

## 2016-06-10 ENCOUNTER — Telehealth: Payer: Self-pay | Admitting: Internal Medicine

## 2016-06-10 DIAGNOSIS — J479 Bronchiectasis, uncomplicated: Secondary | ICD-10-CM

## 2016-06-10 NOTE — Telephone Encounter (Signed)
Anita Gonzalez  CT still shows some bronchiectasis and possible ILD. Her next fu is only in Nov 2017. In interim can you have her do  Serum: ESR, ACE, ANA, DS-DNA, RF, anti-CCP, ssA, ssB, scl-70, IgG, igM, IgE and igA  Thanks  Dr. Brand Males, M.D., [facility] Pulmonary and Critical Care Medicine Staff Physician [facility] Pager: [phone], If no answer or between  15:00h - 7:00h: call [phone]  06/10/2016 6:42 AM

## 2016-06-11 NOTE — Telephone Encounter (Signed)
With CT do you still want to pursure R heart cath. Can arrange if you want

## 2016-06-12 NOTE — Telephone Encounter (Signed)
lmtcb X1 for pt  

## 2016-06-12 NOTE — Telephone Encounter (Signed)
Patient returned call, please call [phone].

## 2016-06-13 NOTE — Telephone Encounter (Signed)
Called spoke with pt regarding results. Labs order placed. She will have this done this week. nothing further needed

## 2016-06-14 ENCOUNTER — Other Ambulatory Visit (HOSPITAL_BASED_OUTPATIENT_CLINIC_OR_DEPARTMENT_OTHER): Payer: Medicare HMO

## 2016-06-14 ENCOUNTER — Encounter: Payer: Self-pay | Admitting: Internal Medicine

## 2016-06-14 DIAGNOSIS — D509 Iron deficiency anemia, unspecified: Secondary | ICD-10-CM | POA: Diagnosis not present

## 2016-06-14 DIAGNOSIS — J479 Bronchiectasis, uncomplicated: Secondary | ICD-10-CM

## 2016-06-14 LAB — CBC WITH DIFFERENTIAL/PLATELET
BASO%: 0 % (ref 0.0–2.0)
Basophils Absolute: 0 10*3/uL (ref 0.0–0.1)
EOS ABS: 0.2 10*3/uL (ref 0.0–0.5)
EOS%: 3.4 % (ref 0.0–7.0)
HCT: 38.4 % (ref 34.8–46.6)
HEMOGLOBIN: 11.9 g/dL (ref 11.6–15.9)
LYMPH#: 1.6 10*3/uL (ref 0.9–3.3)
LYMPH%: 32.7 % (ref 14.0–49.7)
MCH: 30.4 pg (ref 25.1–34.0)
MCHC: 31 g/dL — ABNORMAL LOW (ref 31.5–36.0)
MCV: 98 fL (ref 79.5–101.0)
MONO#: 0.3 10*3/uL (ref 0.1–0.9)
MONO%: 5.6 % (ref 0.0–14.0)
NEUT%: 58.3 % (ref 38.4–76.8)
NEUTROS ABS: 2.9 10*3/uL (ref 1.5–6.5)
Platelets: 174 10*3/uL (ref 145–400)
RBC: 3.92 10*6/uL (ref 3.70–5.45)
RDW: 14 % (ref 11.2–14.5)
WBC: 5 10*3/uL (ref 3.9–10.3)
nRBC: 0 % (ref 0–0)

## 2016-06-14 LAB — FERRITIN: Ferritin: 149 ng/ml (ref 9–269)

## 2016-06-14 LAB — IRON AND TIBC
%SAT: 16 % — ABNORMAL LOW (ref 21–57)
Iron: 49 ug/dL (ref 41–142)
TIBC: 303 ug/dL (ref 236–444)
UIBC: 254 ug/dL (ref 120–384)

## 2016-06-16 ENCOUNTER — Ambulatory Visit: Payer: Medicare HMO

## 2016-06-19 ENCOUNTER — Ambulatory Visit: Payer: Medicare HMO

## 2016-06-21 ENCOUNTER — Telehealth: Payer: Self-pay | Admitting: Hematology

## 2016-06-21 ENCOUNTER — Encounter: Payer: Self-pay | Admitting: Hematology

## 2016-06-21 ENCOUNTER — Encounter: Payer: Self-pay | Admitting: *Deleted

## 2016-06-21 ENCOUNTER — Ambulatory Visit (HOSPITAL_BASED_OUTPATIENT_CLINIC_OR_DEPARTMENT_OTHER): Payer: Medicare HMO | Admitting: Hematology

## 2016-06-21 VITALS — BP 127/54 | HR 60 | Temp 98.0°F | Resp 18 | Ht 61.0 in | Wt 222.2 lb

## 2016-06-21 DIAGNOSIS — D509 Iron deficiency anemia, unspecified: Secondary | ICD-10-CM | POA: Diagnosis not present

## 2016-06-21 DIAGNOSIS — M069 Rheumatoid arthritis, unspecified: Secondary | ICD-10-CM | POA: Diagnosis not present

## 2016-06-21 DIAGNOSIS — E119 Type 2 diabetes mellitus without complications: Secondary | ICD-10-CM

## 2016-06-21 DIAGNOSIS — I10 Essential (primary) hypertension: Secondary | ICD-10-CM | POA: Diagnosis not present

## 2016-06-21 NOTE — Progress Notes (Signed)
[facility]  [phone] [phone]  Clinic Follow up Note   Patient Care Team: [name], MD as PCP - General (Internal Medicine) Jearld Lesch, MD as Referring Physician (Specialist) Jearld Lesch, MD as Referring Physician (Specialist) Jeani Hawking, MD as Consulting Physician (Gastroenterology) Clyde Lundborg., MD as Referring Physician (Anesthesiology) 06/21/2016  CHIEF COMPLAINT:  Follow-up iron deficient anemia  HISTORY OF PRESENTING ILLNESS (01/2015):  Anita Gonzalez 70 y.o. female with history of DM 2, HTN, HLD, asthma, rheumatoid arthritis, former smoker, recently moved to this area from [place], is here because of anemia.   She presented to the ED on 01/06/2015 with fever, chills, productive cough, dyspnea, wheezing, chest pain and poor appetite of ~ 1 week duration. In the ED, febrile to 102.70F, tachycardic, WBC 12.5 and chest x-ray suggestive of right lower lobe pneumonia. Treating for CAP, then developed acute kidney injury-improving and narrow complex tachycardia 2/27. Her clinical condition improved and she was discharged home with oxygen on 01/11/2015. During her hospital stay, she was found to have moderate anemia with hemoglobin 8-9, low serum iron and ferritin level. She was referred to our clinic for further evaluation.  She was found to have abnormal CBC from 2011, and has been on iron pill two pill daily, and B12 injection and pill for the past two years. She had EGD, capsule endoscopy and colonoscopy in [place] a few years ago by Dr. Francella Solian, which was normal per pt.   She denies recent chest pain on exertion, (+) shortness of breath on exertion at night, no pre-syncopal episodes, or palpitations. She had not noticed any recent bleeding such as epistaxis, hematuria or hematochezia The patient denies over the counter NSAID ingestion. She is not on antiplatelets agents. She had no prior history or diagnosis of  cancer. Her age appropriate screening programs are up-to-date. She denies any pica and eats a variety of diet. She never donated blood or received blood transfusion  She is still on oxygen. She has some dyspnea at night. No orthopnea. She is able to do all her ADLs and light housework. She was not very physically active even before the hospitalization last month.  CURRENT THERAPY: 1.  IV Feraheme as need, received in 01/2015, 03/2015 and 09/2015  2. Ferrous sulfate 2 tablets a day  INTERIM HISTORY: [name] returns for follow-up. She is doing moderately well overall. She complains of being sluggish, fatigue, and shortness breath on exertion. She uses oxygen at night and as needed during day. She came in a wheelchair. She has been following with her pulmonologist and cardiologist. She denies any signs of bleeding. She takes ferrous sulfate 2 tablets today, tolerating well.  MEDICAL HISTORY:  Past Medical History:  Diagnosis Date  . Anemia   . Arthritis   . Asthma   . Bronchitis   . Diabetes mellitus without complication (HCC)   . GERD (gastroesophageal reflux disease)   . Hay fever   . Hypertension   . Rheumatoid arthritis (HCC) 1996    SURGICAL HISTORY: Past Surgical History:  Procedure Laterality Date  . ABDOMINAL HYSTERECTOMY    . CESAREAN SECTION    . CESAREAN SECTION    . COLONOSCOPY WITH PROPOFOL N/A 04/02/2015   Procedure: COLONOSCOPY WITH PROPOFOL;  Surgeon: Jeani Hawking, MD;  Location: [facility];  Service: Endoscopy;  Laterality: N/A;  . ESOPHAGOGASTRODUODENOSCOPY (EGD) WITH PROPOFOL N/A 04/02/2015   Procedure: ESOPHAGOGASTRODUODENOSCOPY (EGD) WITH PROPOFOL;  Surgeon: Jeani Hawking, MD;  Location: [facility];  Service: Endoscopy;  Laterality: N/A;  . HEMORRHOID SURGERY    . HERNIA REPAIR    . HERNIA REPAIR      SOCIAL HISTORY: Social History   Social History  . Marital status: Single    Spouse name: N/A  . Number of children: N/A  . Years of education: N/A     Occupational History  . retired    Social History Main Topics  . Smoking status: Former Smoker    Packs/day: 0.75    Years: 25.00    Types: Cigarettes    Quit date: 01/12/2011  . Smokeless tobacco: Never Used  . Alcohol use 2.4 oz/week    4 Cans of beer per week     Comment: occ  . Drug use: No  . Sexual activity: Not on file   Other Topics Concern  . Not on file   Social History Narrative  . No narrative on file    FAMILY HISTORY: Family History  Problem Relation Age of Onset  . Diabetes Mother   . Heart failure Mother   . Throat cancer Brother     throat cancer     ALLERGIES:  is allergic to fish allergy; peanuts [peanut oil]; hydrocodone; iodinated diagnostic agents; other; oxycodone; oxycodone-acetaminophen; penicillins; percocet [oxycodone-acetaminophen]; and sulfa antibiotics.  MEDICATIONS:  Current Outpatient Prescriptions  Medication Sig Dispense Refill  . albuterol (PROVENTIL HFA;VENTOLIN HFA) 108 (90 BASE) MCG/ACT inhaler Inhale 2 puffs into the lungs every 6 (six) hours as needed for wheezing or shortness of breath (wheezing).     . budesonide-formoterol (SYMBICORT) 160-4.5 MCG/ACT inhaler Inhale 2 puffs into the lungs 2 (two) times daily. 1 Inhaler 6  . bumetanide (BUMEX) 2 MG tablet Take 2 mg by mouth 2 (two) times a week. Monday and Thursday.    . clobetasol (TEMOVATE) 0.05 % GEL Apply 1 application topically 2 (two) times daily. 30 each 0  . cyanocobalamin 1000 MCG tablet Take 100 mcg by mouth once a week. Monday    . cyclobenzaprine (FLEXERIL) 10 MG tablet Take 10 mg by mouth daily.     . ergocalciferol (VITAMIN D2) 50000 UNITS capsule Take 50,000 Units by mouth once a week. Thursday.    . ferrous sulfate 325 (65 FE) MG tablet Take 325 mg by mouth 2 (two) times daily with a meal.     . fluticasone (FLONASE) 50 MCG/ACT nasal spray Place 1 spray into both nostrils daily as needed for allergies or rhinitis.    . folic acid (FOLVITE) 1 MG tablet Take 1 mg  by mouth daily.    Marland Kitchen ipratropium-albuterol (DUONEB) 0.5-2.5 (3) MG/3ML SOLN Inhale 3 mLs into the lungs every 6 (six) hours.    Marland Kitchen loperamide (IMODIUM A-D) 2 MG tablet Take 2 mg by mouth 4 (four) times daily as needed for diarrhea or loose stools.    . meloxicam (MOBIC) 7.5 MG tablet Take 15 mg by mouth daily.     . metFORMIN (GLUCOPHAGE) 500 MG tablet Take 500 mg by mouth 2 (two) times daily.    . Metoprolol Tartrate 75 MG TABS Take 1 tablet by mouth daily.    . montelukast (SINGULAIR) 10 MG tablet Take 10 mg by mouth at bedtime.    . Multiple Vitamin (MULTIVITAMIN) tablet Take 1 tablet by mouth daily. With iron.    Marland Kitchen omeprazole (PRILOSEC) 20 MG capsule Take 20 mg by mouth daily.    . potassium chloride (K-DUR,KLOR-CON) 10 MEQ tablet Take 10 mEq by mouth every  other day.     . senna (SENOKOT) 8.6 MG TABS tablet Take 8.6 mg by mouth 2 (two) times daily as needed for mild constipation. Reported on 03/06/2016    . simvastatin (ZOCOR) 20 MG tablet Take 20 mg by mouth every evening.     . temazepam (RESTORIL) 15 MG capsule 1 or 2 AT BEDTIME FOR SLEEP IF NEEDED 30 capsule 0  . valsartan (DIOVAN) 80 MG tablet Take 1 tablet by mouth daily.    . verapamil (VERELAN PM) 360 MG 24 hr capsule Take 1 capsule (360 mg total) by mouth daily. 30 capsule 0   No current facility-administered medications for this visit.     REVIEW OF SYSTEMS:   Constitutional: Denies fevers, chills or abnormal night sweats, (+) fatigue Eyes: Denies blurriness of vision, double vision or watery eyes Ears, nose, mouth, throat, and face: Denies mucositis or sore throat Respiratory: Denies cough, (+) dyspnea, no wheezes Cardiovascular: Denies palpitation, chest discomfort or lower extremity swelling Gastrointestinal:  Denies nausea, heartburn or change in bowel habits Skin: Denies abnormal skin rashes Lymphatics: Denies new lymphadenopathy or easy bruising Neurological:Denies numbness, tingling or new weaknesses Behavioral/Psych:  Mood is stable, no new changes  All other systems were reviewed with the patient and are negative.  PHYSICAL EXAMINATION: ECOG PERFORMANCE STATUS: 2 - Symptomatic, <50% confined to bed  Vitals:   06/21/16 0951  BP: (!) 127/54  Pulse: 60  Resp: 18  Temp: 98 F (36.7 C)   Filed Weights   06/21/16 0951  Weight: 222 lb 3.2 oz (100.8 kg)    GENERAL:alert, no distress and comfortable SKIN: skin color, texture, turgor are normal, no rashes or significant lesions EYES: normal, conjunctiva are pink and non-injected, sclera clear OROPHARYNX:no exudate, no erythema and lips, buccal mucosa, and tongue normal  NECK: supple, thyroid normal size, non-tender, without nodularity LYMPH:  no palpable lymphadenopathy in the cervical, axillary or inguinal LUNGS: clear to auscultation and percussion with normal breathing effort HEART: regular rate & rhythm and no murmurs and no lower extremity edema ABDOMEN:abdomen soft, non-tender and normal bowel sounds Musculoskeletal:no cyanosis of digits and no clubbing  PSYCH: alert & oriented x 3 with fluent speech NEURO: no focal motor/sensory deficits  LABORATORY DATA:  I have reviewed the data as listed CBC Latest Ref Rng & Units 06/14/2016 01/28/2016 11/19/2015  WBC 3.9 - 10.3 10e3/uL 5.0 5.5 6.8  Hemoglobin 11.6 - 15.9 g/dL 40.9 81.1 91.4  Hematocrit 34.8 - 46.6 % 38.4 38.9 39.5  Platelets 145 - 400 10e3/uL 174 240 268    CMP Latest Ref Rng & Units 11/03/2015 10/20/2015 02/25/2015  Glucose 65 - 99 mg/dL 782(N) 562(Z) 89  BUN 6 - 20 mg/dL 9 18 30.8  Creatinine 6.57 - 1.00 mg/dL 8.46(N) 6.29(B) 1.0  Sodium 135 - 145 mmol/L 143 143 142  Potassium 3.5 - 5.1 mmol/L 4.7 3.8 4.0  Chloride 101 - 111 mmol/L 102 106 -  CO2 22 - 32 mmol/L 32 30 30(H)  Calcium 8.9 - 10.3 mg/dL 9.7 9.0 8.8  Total Protein 6.5 - 8.1 g/dL - 6.2(L) 6.8  Total Bilirubin 0.3 - 1.2 mg/dL - 0.5 2.84  Alkaline Phos 38 - 126 U/L - 76 90  AST 15 - 41 U/L - 22 24  ALT 14 - 54 U/L - 32 23    Results for PHEBE, DETTMER (MRN [id number]) as of 06/21/2016 07:04  Ref. Range 09/17/2015 12:59 11/19/2015 14:10 01/28/2016 13:18 06/14/2016 12:29  Iron Latest Ref Range: 41 -  142 ug/dL 48 31 (L) 52 49  UIBC Latest Ref Range: 120 - 384 ug/dL 347 425 956 387  TIBC Latest Ref Range: 236 - 444 ug/dL 564 332 951 884  %SAT Latest Ref Range: 21 - 57 % 15 (L) 10 (L) 17 (L) 16 (L)  Ferritin Latest Ref Range: 9 - 269 ng/ml 54 137 279 (H) 149    RADIOGRAPHIC STUDIES: I have personally reviewed the radiological images as listed and agreed with the findings in the report. Ct Chest High Resolution  Result Date: 06/09/2016 CLINICAL DATA:  70 year old female with chronic shortness of breath. Pulmonary infiltrate. Followup study. EXAM: CT CHEST WITHOUT CONTRAST TECHNIQUE: Multidetector CT imaging of the chest was performed following the standard protocol without intravenous contrast. High resolution imaging of the lungs, as well as inspiratory and expiratory imaging, was performed. COMPARISON:  Chest CT 04/13/2015. FINDINGS: Cardiovascular: Heart size is mildly enlarged with left atrial dilatation. There is no significant pericardial fluid, thickening or pericardial calcification. There is aortic atherosclerosis, as well as atherosclerosis of the great vessels of the mediastinum and the coronary arteries, including calcified atherosclerotic plaque in the left anterior descending, left circumflex and right coronary arteries. Calcifications of the mitral annulus. Mediastinum/Nodes: Multiple borderline enlarged mediastinal and hilar lymph nodes are noted, but are nonspecific. No pathologically enlarged mediastinal or hilar lymph nodes. Please note that accurate exclusion of hilar adenopathy is limited on noncontrast CT scans. Esophagus is unremarkable in appearance. No axillary lymphadenopathy. Lungs/Pleura: Similar to the prior examination from 04/13/2015 there are scattered areas of mild cylindrical bronchiectasis and  associated thickening of the peribronchovascular interstitium. A few scattered areas of very mild ground-glass attenuation and septal thickening are again noted, unchanged compared to the prior examination, again most evident in the anterior aspects of the left upper lobe. No honeycombing. Inspiratory and expiratory imaging now demonstrates some mild air trapping, indicative of mild small airways disease. In addition, there is severe collapse of the trachea and mild collapse of the mainstem bronchi on expiratory phase imaging, indicative of tracheobronchomalacia. No acute consolidative airspace disease. No pleural effusions. No suspicious appearing pulmonary nodules or masses. Upper Abdomen: Visualized portions of the upper abdomen are unremarkable. Musculoskeletal: There are no aggressive appearing lytic or blastic lesions noted in the visualized portions of the skeleton. IMPRESSION: 1. Again noted is mild diffuse cylindrical bronchiectasis and thickening of the peribronchovascular interstitium. The subtle areas of ground-glass attenuation and septal thickening in the lungs persist compared to the prior study, but appear unchanged, and may indicate areas of nonspecific interstitial pneumonia (NSIP). 2. Today's study also demonstrates mild air trapping, indicative of small airways disease, and demonstrates evidence of tracheobronchomalacia. In retrospect, the prior expiratory phase images were unlikely to be truly expiratory on study 04/13/2015. 3. Aortic atherosclerosis, in addition to three-vessel coronary artery disease. Please note that although the presence of coronary artery calcium documents the presence of coronary artery disease, the severity of this disease and any potential stenosis cannot be assessed on this non-gated CT examination. Assessment for potential risk factor modification, dietary therapy or pharmacologic therapy may be warranted, if clinically indicated. 4. Cardiomegaly with mild left atrial  dilatation. Electronically Signed   By: Trudie Reed M.D.   On: 06/09/2016 08:11   ASSESSMENT & PLAN: 70 year old African-American female with past medical history of diabetes, hypertension, rheumatoid arthritis, who has chronic anemia for a few years.  1. Normocytic anemia secondary to iron deficiency, from GI AVM bleeding  -She has moderate anemia with hemoglobin  in 8-9 range, MCV normal, ferritin 16, serum iron 13, URBC elevated at 430, iron saturation 3%, this is consistent with iron deficient anemia -Anemia has been a few years, likely secondary to slow GI bleeding. Her stool OB was positive. Colonoscopy showed AVM, last done in 03/2015. -Her SPEP and UPEP were negative.  -She also may have a component of anemia of chronic disease secondary to kidney disease and rheumatoid arthritis -Her hemoglobin has significantly improved after IV Feraheme, she received IV Feraheme 4 times in 2016, hemoglobin 11.9 today. -Lab results reviewed with her today. Her iron study are within normal limits, ferritin 149. No need IV Feraheme. I encouraged her to continue oral ferrous sulfate 2 tablets a day -We'll continue monitoring her CBC and iron level, consider IV Feraheme as needed  2. Hypertension, diabetes, rheumatoid arthritis, asthma -She will continue follow-up with her primary care physician and other specialists   Plan -Repeat a CBC, Feraheme and iron study in 3 months, she will call me the day after her lab test -I'll see him back in 6 months with repeated lab one week before  -Consider IV Feraheme if ferritin less than 100 or very low serum iron level  All questions were answered. The patient knows to call the clinic with any problems, questions or concerns. I spent 10 minutes counseling the patient face to face. The total time spent in the appointment was 15 minutes and more than 50% was on counseling.     Malachy Mood, MD 06/21/16 9:53 AM

## 2016-06-21 NOTE — Telephone Encounter (Signed)
per pof to sch pt appt-gave pt ocpy of avs °

## 2016-06-27 ENCOUNTER — Ambulatory Visit: Payer: Medicare HMO

## 2016-06-30 ENCOUNTER — Ambulatory Visit: Payer: Medicare HMO | Attending: Internal Medicine | Admitting: Rehabilitation

## 2016-06-30 DIAGNOSIS — R2681 Unsteadiness on feet: Secondary | ICD-10-CM | POA: Diagnosis present

## 2016-06-30 DIAGNOSIS — M25562 Pain in left knee: Secondary | ICD-10-CM

## 2016-06-30 DIAGNOSIS — M545 Low back pain, unspecified: Secondary | ICD-10-CM

## 2016-06-30 DIAGNOSIS — M25561 Pain in right knee: Secondary | ICD-10-CM | POA: Diagnosis present

## 2016-06-30 DIAGNOSIS — M6281 Muscle weakness (generalized): Secondary | ICD-10-CM | POA: Diagnosis present

## 2016-06-30 DIAGNOSIS — R2689 Other abnormalities of gait and mobility: Secondary | ICD-10-CM | POA: Insufficient documentation

## 2016-07-01 ENCOUNTER — Telehealth: Payer: Self-pay | Admitting: Rehabilitation

## 2016-07-01 NOTE — Telephone Encounter (Signed)
Dr. Tenny Craw,    I evaluated Ms. Anita Gonzalez yesterday at [facility] neuro for PT.  Evaluation was limited due to SOB and oxygen saturation dropping to mid 80's with very short bouts of gait.  Note that they dropped to 84% at lowest and required approx 2-3 mins with pursed lip breathing before they would rise to 91-94%.  Pt states she uses oxygen at home "when needed."  Would like clarification or recommendations on oxygen use during therapy.  She did state that she is seeing Pulmonologist next week, therefore also recommended she ask them and notify them of oxygen saturation during therapy.  I just wanted you to be aware as well.    Thanks,  Harriet Butte, PT, MPT [facility] [address] [place], [place], [address] Phone: [phone]   Fax:  [phone] 07/01/16, 8:56 AM

## 2016-07-01 NOTE — Therapy (Signed)
[facility] [address] [place], [place], [address] Phone: [phone]   Fax:  [phone]  Physical Therapy Evaluation  Patient Details  Name: Anita Gonzalez MRN: [id number] Date of Birth: 26-Oct-1946 Referring Provider: Dietrich Pates, MD  Encounter Date: 06/30/2016      PT End of Session - 07/01/16 0808    Visit Number 1   Number of Visits 17   Date for PT Re-Evaluation 08/29/16   Authorization Type [employer] Codes every 10th visit   PT Start Time 1102   PT Stop Time 1148   PT Time Calculation (min) 46 min   Activity Tolerance Treatment limited secondary to medical complications (Comment);Other (comment)  decreased O2 saturation   Behavior During Therapy WFL for tasks assessed/performed      Past Medical History:  Diagnosis Date  . Anemia   . Arthritis   . Asthma   . Bronchitis   . Diabetes mellitus without complication (HCC)   . GERD (gastroesophageal reflux disease)   . Hay fever   . Hypertension   . Rheumatoid arthritis (HCC) 1996    Past Surgical History:  Procedure Laterality Date  . ABDOMINAL HYSTERECTOMY    . CESAREAN SECTION    . CESAREAN SECTION    . COLONOSCOPY WITH PROPOFOL N/A 04/02/2015   Procedure: COLONOSCOPY WITH PROPOFOL;  Surgeon: Jeani Hawking, MD;  Location: [facility];  Service: Endoscopy;  Laterality: N/A;  . ESOPHAGOGASTRODUODENOSCOPY (EGD) WITH PROPOFOL N/A 04/02/2015   Procedure: ESOPHAGOGASTRODUODENOSCOPY (EGD) WITH PROPOFOL;  Surgeon: Jeani Hawking, MD;  Location: [facility];  Service: Endoscopy;  Laterality: N/A;  . HEMORRHOID SURGERY    . HERNIA REPAIR    . HERNIA REPAIR      There were no vitals filed for this visit.       Subjective Assessment - 06/30/16 1109    Subjective "The doctor said that I'm off balance, and I am quite a bit."    Limitations House hold activities;Walking   Patient Stated Goals "I want to prevent falls"     Currently in Pain? Yes   Pain  Score 6    Pain Location Knee   Pain Orientation Right;Left   Pain Descriptors / Indicators Aching;Burning  burning at night   Pain Type Chronic pain   Pain Onset More than a month ago   Pain Frequency Intermittent   Aggravating Factors  walking long distances   Pain Relieving Factors medication                                PT Education - 07/01/16 0805    Education provided Yes   Education Details Education on evaluation findings, POC, speaking with MD next week regarding sats during therapy and the possible need to wear O2 at all times.     Person(s) Educated Patient   Methods Explanation   Comprehension Verbalized understanding          PT Short Term Goals - 07/01/16 0827      PT SHORT TERM GOAL #1   Title Pt will initiate HEP in order to indicate improved functional mobility and decreased fall risk.  (Target Date: 07/28/16)   Time 4   Period Weeks   Status New     PT SHORT TERM GOAL #2   Title Will assess gait speed and write goal as appropriate to indicate decreased fall risk and improved gait efficiency.  Time 4   Period Weeks   Status New     PT SHORT TERM GOAL #3   Title Pt will improve 5TSS time to <16 seconds with use of UEs on lap in order to indicate improved functional strength.     Time 4   Period Weeks   Status New     PT SHORT TERM GOAL #4   Title Will assess 2 MWT as able and improve distance by 5354' in order to indicate improved functional endurance.     Time 4   Period Weeks   Status New     PT SHORT TERM GOAL #5   Title Pt will ambulate over varying indoor surfaces with LRAD 150' at mod I level in order to indicate safe home negotiation.     Time 4   Period Weeks   Status New     Additional Short Term Goals   Additional Short Term Goals Yes     PT SHORT TERM GOAL #6   Title Pt will report pain in low back and knees no more than 4/10 in order to indicate that pain decreased limiting factor in mobility.     Time 4    Period Weeks   Status New     PT SHORT TERM GOAL #7   Title Will assess BERG balance and write LTG as appropriate to indicate decreased fall risk.     Time 4   Period Weeks   Status New           PT Long Term Goals - 07/01/16 0836      PT LONG TERM GOAL #1   Title Pt will be independent with HEP in order to indicate improved functional mobility and decreased fall risk.  (Target Date: 08/25/16)   Time 8   Period Weeks   Status New     PT LONG TERM GOAL #2   Title Pt will improve gait speed by .60 ft/sec with LRAD in order to indicate improved efficiency of gait.     Time 8   Period Weeks   Status New     PT LONG TERM GOAL #3   Title Pt will perform 5TSS test in <14 seconds with single UE support on lap in order to indicate improved functional strength.     Time 8   Period Weeks   Status New     PT LONG TERM GOAL #4   Title Pt will improve 2 MWT by 108' from baseline in order to indicate improved functional endurance.     Time 8   Period Weeks   Status New     PT LONG TERM GOAL #5   Title Pt will report pain in low back and knees no more than 3/10 in order to indicate pain is decreased limiting factor in mobility.     Time 8   Period Weeks   Status New     Additional Long Term Goals   Additional Long Term Goals Yes     PT LONG TERM GOAL #6   Title Pt will ambulate 300' with LRAD at mod I level over paved outdoor surfaces (curb and ramp) in order to indicate safe community re-entry.     Time 8   Period Weeks   Status New     PT LONG TERM GOAL #7   Title Pt will verbalize initiation of community fitness in order to maintain progress made in therapy.     Time 8  Period Weeks   Status New               Plan - 2016/07/17 7591    Clinical Impression Statement Pt presents with decreased balance, declining over the past two years per pt report.  Note that she uses a SPC, but has increased pain in low back pain from previous falls and arthritis in B  knees, limiting mobility.  Note history of RA, asthma, and DM with peripheral neuropathy that could affect pts progress in therapy.  Upon PT evaluation, note that 5TSS time is 19.84 seconds, indicative of decreased balance and functional strength.  Performed small bouts of gait during session, however evaluation limited due to pts SOB and decreased O2 sats.  Note that following very short bouts of gait, SaO2 would drop to mid 80's and require 2-3 mins to increase back to low 90s on RA.  Pt states she has MD appt next week, therefore recommend that she discuss this with MD and let them know what her sats were during therapy so that we can proceed safely during therapy and know if she needs to wear oxygen at all times.  Pt verbalized understanding.  Pt is of evolving presentation due to medical issues and moderate complexity.  Pt will benefit from skilled OP neuro PT in order to address strength, balance, and endurance deficits.     Rehab Potential Good   Clinical Impairments Affecting Rehab Potential co-morbidities   PT Frequency 2x / week   PT Duration 8 weeks   PT Treatment/Interventions ADLs/Self Care Home Management;Moist Heat;Ultrasound;DME Instruction;Gait training;Stair training;Functional mobility training;Therapeutic activities;Therapeutic exercise;Balance training;Neuromuscular re-education;Patient/family education;Manual techniques;Energy conservation   PT Next Visit Plan Primary PT sent note to MD regarding O2 sats-see response if there, also ask her what Pulmonologist said regarding O2 wear.  Gait speed, Perform 2 MWT if able, initiate HEP for BLE strengthening in pain free range, core stabilization, balance   Consulted and Agree with Plan of Care Patient      Patient will benefit from skilled therapeutic intervention in order to improve the following deficits and impairments:  Abnormal gait, Cardiopulmonary status limiting activity, Decreased activity tolerance, Decreased balance, Decreased  endurance, Decreased knowledge of use of DME, Decreased mobility, Decreased strength, Difficulty walking, Impaired perceived functional ability, Impaired flexibility, Improper body mechanics, Postural dysfunction, Pain, Impaired sensation  Visit Diagnosis: Unsteadiness on feet - Plan: PT plan of care cert/re-cert  Other abnormalities of gait and mobility - Plan: PT plan of care cert/re-cert  Muscle weakness (generalized) - Plan: PT plan of care cert/re-cert  Midline low back pain without sciatica - Plan: PT plan of care cert/re-cert  Pain in left knee - Plan: PT plan of care cert/re-cert  Pain in right knee - Plan: PT plan of care cert/re-cert      G-Codes - July 17, 2016 0848    Functional Assessment Tool Used 5TSS: 19.84 seconds, up to 65' of gait with SPC at S level-SaO2 dropped to 84%   Functional Limitation Mobility: Walking and moving around   Mobility: Walking and Moving Around Current Status [phone]) At least 60 percent but less than 80 percent impaired, limited or restricted   Mobility: Walking and Moving Around Goal Status [phone]) At least 20 percent but less than 40 percent impaired, limited or restricted       Problem List Patient Active Problem List   Diagnosis Date Noted  . Bronchiectasis (HCC) 03/07/2016  . Asthma 03/06/2016  . Leg cramps, sleep related 12/01/2015  . OSA (  obstructive sleep apnea) 07/20/2015  . Dyspnea and respiratory abnormality 04/06/2015  . Hypoxemia 04/06/2015  . History of rheumatoid arthritis 04/06/2015  . History of smoking 04/06/2015  . Anemia, iron deficiency 01/28/2015  . Acute respiratory failure with hypoxia (HCC) 01/11/2015  . Acute kidney injury (HCC) 01/11/2015  . E-coli UTI 01/11/2015  . Sinus tachycardia (HCC) 01/11/2015  . Community acquired pneumonia-RLL 01/06/2015  . Sepsis due to pneumonia (HCC) 01/06/2015  . Asthma exacerbation 01/06/2015  . Anemia 01/06/2015  . Essential hypertension 01/06/2015  . DM II (diabetes mellitus,  type II), controlled (HCC) 01/06/2015  . HLD (hyperlipidemia) 01/06/2015  . Rheumatoid arthritis (HCC) 01/06/2015   Harriet Butte, PT, MPT [facility] [address] [place], [place], [address] Phone: [phone]   Fax:  [phone] 07/01/16, 8:52 AM  Name: Anita Gonzalez MRN: [id number] Date of Birth: Oct 27, 1946

## 2016-07-04 ENCOUNTER — Ambulatory Visit: Payer: Medicare HMO

## 2016-07-04 ENCOUNTER — Encounter (INDEPENDENT_AMBULATORY_CARE_PROVIDER_SITE_OTHER): Payer: Self-pay

## 2016-07-04 DIAGNOSIS — R2689 Other abnormalities of gait and mobility: Secondary | ICD-10-CM

## 2016-07-04 DIAGNOSIS — R002 Palpitations: Secondary | ICD-10-CM

## 2016-07-05 ENCOUNTER — Ambulatory Visit: Payer: Medicare HMO | Admitting: Physical Therapy

## 2016-07-10 ENCOUNTER — Ambulatory Visit: Payer: Medicare HMO | Admitting: Physical Therapy

## 2016-07-13 ENCOUNTER — Ambulatory Visit: Payer: Medicare HMO | Admitting: Physical Therapy

## 2016-07-19 ENCOUNTER — Ambulatory Visit: Payer: Medicare HMO | Admitting: Physical Therapy

## 2016-07-20 ENCOUNTER — Ambulatory Visit: Payer: Medicare HMO | Attending: Internal Medicine | Admitting: Physical Therapy

## 2016-07-21 ENCOUNTER — Ambulatory Visit
Admission: RE | Admit: 2016-07-21 | Discharge: 2016-07-21 | Disposition: A | Discharge status: Home / Self Care | Payer: Medicare HMO | Source: Ambulatory Visit | Attending: Internal Medicine | Admitting: Internal Medicine

## 2016-07-21 DIAGNOSIS — Z1231 Encounter for screening mammogram for malignant neoplasm of breast: Secondary | ICD-10-CM

## 2016-07-25 ENCOUNTER — Ambulatory Visit: Payer: Medicare HMO | Admitting: Physical Therapy

## 2016-07-27 ENCOUNTER — Other Ambulatory Visit (HOSPITAL_COMMUNITY): Payer: Self-pay | Admitting: Gastroenterology

## 2016-07-27 DIAGNOSIS — B182 Chronic viral hepatitis C: Secondary | ICD-10-CM

## 2016-07-28 ENCOUNTER — Ambulatory Visit: Payer: Medicare HMO | Admitting: Rehabilitation

## 2016-08-01 ENCOUNTER — Ambulatory Visit (INDEPENDENT_AMBULATORY_CARE_PROVIDER_SITE_OTHER): Payer: Medicare HMO

## 2016-08-01 ENCOUNTER — Ambulatory Visit: Payer: Medicare HMO | Admitting: Physical Therapy

## 2016-08-01 DIAGNOSIS — R002 Palpitations: Secondary | ICD-10-CM | POA: Diagnosis not present

## 2016-08-01 DIAGNOSIS — R2689 Other abnormalities of gait and mobility: Secondary | ICD-10-CM | POA: Diagnosis not present

## 2016-08-03 ENCOUNTER — Ambulatory Visit: Payer: Medicare HMO | Admitting: Physical Therapy

## 2016-08-07 ENCOUNTER — Ambulatory Visit: Payer: Medicare HMO | Admitting: Rehabilitation

## 2016-08-09 ENCOUNTER — Telehealth: Payer: Self-pay | Admitting: *Deleted

## 2016-08-09 NOTE — Telephone Encounter (Signed)
Patient had 24 hour holter monitor applied on 08/02/16, which, to date has not been returned to our office. Patient stated she has been sick and has transportation issues.  She has transportation set up for Monday, 08/14/2016, and will return the monitor at that time.

## 2016-08-10 ENCOUNTER — Ambulatory Visit: Payer: Medicare HMO | Admitting: Physical Therapy

## 2016-08-15 ENCOUNTER — Ambulatory Visit: Payer: Medicare HMO | Admitting: Physical Therapy

## 2016-08-17 ENCOUNTER — Ambulatory Visit: Payer: Medicare HMO | Admitting: Rehabilitation

## 2016-08-22 ENCOUNTER — Ambulatory Visit: Payer: Medicare HMO | Admitting: Physical Therapy

## 2016-08-24 ENCOUNTER — Ambulatory Visit: Payer: Medicare HMO | Admitting: Rehabilitation

## 2016-08-29 ENCOUNTER — Ambulatory Visit: Payer: Medicare HMO | Admitting: Physical Therapy

## 2016-08-31 ENCOUNTER — Ambulatory Visit: Payer: Medicare HMO | Admitting: Rehabilitation

## 2016-09-13 ENCOUNTER — Ambulatory Visit (HOSPITAL_COMMUNITY): Payer: Medicare HMO

## 2016-09-22 ENCOUNTER — Other Ambulatory Visit (HOSPITAL_BASED_OUTPATIENT_CLINIC_OR_DEPARTMENT_OTHER): Payer: Medicare HMO

## 2016-09-22 DIAGNOSIS — D509 Iron deficiency anemia, unspecified: Secondary | ICD-10-CM

## 2016-09-22 LAB — CBC WITH DIFFERENTIAL/PLATELET
BASO%: 0.6 % (ref 0.0–2.0)
BASOS ABS: 0 10*3/uL (ref 0.0–0.1)
EOS ABS: 0.2 10*3/uL (ref 0.0–0.5)
EOS%: 2.7 % (ref 0.0–7.0)
HCT: 37.9 % (ref 34.8–46.6)
HEMOGLOBIN: 12 g/dL (ref 11.6–15.9)
LYMPH%: 32.1 % (ref 14.0–49.7)
MCH: 30.4 pg (ref 25.1–34.0)
MCHC: 31.6 g/dL (ref 31.5–36.0)
MCV: 96.1 fL (ref 79.5–101.0)
MONO#: 0.6 10*3/uL (ref 0.1–0.9)
MONO%: 8.5 % (ref 0.0–14.0)
NEUT%: 56.1 % (ref 38.4–76.8)
NEUTROS ABS: 3.7 10*3/uL (ref 1.5–6.5)
PLATELETS: 278 10*3/uL (ref 145–400)
RBC: 3.95 10*6/uL (ref 3.70–5.45)
RDW: 14.8 % — AB (ref 11.2–14.5)
WBC: 6.7 10*3/uL (ref 3.9–10.3)
lymph#: 2.1 10*3/uL (ref 0.9–3.3)

## 2016-09-22 LAB — FERRITIN: FERRITIN: 117 ng/mL (ref 9–269)

## 2016-09-22 LAB — IRON AND TIBC
%SAT: 17 % — AB (ref 21–57)
Iron: 55 ug/dL (ref 41–142)
TIBC: 329 ug/dL (ref 236–444)
UIBC: 273 ug/dL (ref 120–384)

## 2016-09-26 ENCOUNTER — Ambulatory Visit (HOSPITAL_COMMUNITY)
Admission: RE | Admit: 2016-09-26 | Discharge: 2016-09-26 | Disposition: A | Discharge status: Home / Self Care | Payer: Medicare HMO | Source: Ambulatory Visit | Attending: Gastroenterology | Admitting: Gastroenterology

## 2016-09-26 DIAGNOSIS — B182 Chronic viral hepatitis C: Secondary | ICD-10-CM | POA: Diagnosis present

## 2016-09-26 DIAGNOSIS — K8020 Calculus of gallbladder without cholecystitis without obstruction: Secondary | ICD-10-CM | POA: Diagnosis not present

## 2016-10-02 ENCOUNTER — Ambulatory Visit: Payer: Medicare HMO | Admitting: Internal Medicine

## 2016-10-09 ENCOUNTER — Encounter: Payer: Self-pay | Admitting: Rehabilitation

## 2016-10-09 NOTE — Therapy (Signed)
[facility] [address] [place], [place], [address] Phone: [phone]   Fax:  [phone]  Patient Details  Name: Anita Gonzalez MRN: [id number] Date of Birth: 09/26/1946 Referring Provider:  No ref. provider found  Encounter Date: 10/09/2016    PHYSICAL THERAPY DISCHARGE SUMMARY  Visits from Start of Care: 1  Current functional level related to goals / functional outcomes: Pt did not return to therapy   Remaining deficits:     PT Long Term Goals - 07/01/16 0836      PT LONG TERM GOAL #1   Title Pt will be independent with HEP in order to indicate improved functional mobility and decreased fall risk.  (Target Date: 08/25/16)   Time 8   Period Weeks   Status New     PT LONG TERM GOAL #2   Title Pt will improve gait speed by .60 ft/sec with LRAD in order to indicate improved efficiency of gait.     Time 8   Period Weeks   Status New     PT LONG TERM GOAL #3   Title Pt will perform 5TSS test in <14 seconds with single UE support on lap in order to indicate improved functional strength.     Time 8   Period Weeks   Status New     PT LONG TERM GOAL #4   Title Pt will improve 2 MWT by 108' from baseline in order to indicate improved functional endurance.     Time 8   Period Weeks   Status New     PT LONG TERM GOAL #5   Title Pt will report pain in low back and knees no more than 3/10 in order to indicate pain is decreased limiting factor in mobility.     Time 8   Period Weeks   Status New     Additional Long Term Goals   Additional Long Term Goals Yes     PT LONG TERM GOAL #6   Title Pt will ambulate 300' with LRAD at mod I level over paved outdoor surfaces (curb and ramp) in order to indicate safe community re-entry.     Time 8   Period Weeks   Status New     PT LONG TERM GOAL #7   Title Pt will verbalize initiation of community fitness in order to maintain progress made in therapy.     Time 8   Period Weeks   Status New        Education / Equipment: n/a  Plan: Patient agrees to discharge.  Patient goals were not met. Patient is being discharged due to not returning since the last visit.  ?????        Emily Parcell, PT, MPT [facility] [address] [place], [place], [address] Phone: [phone]   Fax:  [phone] 10/09/16, 1:28 PM  

## 2016-10-18 ENCOUNTER — Ambulatory Visit: Payer: Medicare HMO | Admitting: Internal Medicine

## 2016-10-19 ENCOUNTER — Ambulatory Visit: Payer: Medicare HMO | Admitting: Internal Medicine

## 2016-11-09 ENCOUNTER — Telehealth: Payer: Self-pay | Admitting: Internal Medicine

## 2016-11-09 ENCOUNTER — Telehealth: Payer: Self-pay | Admitting: Physician Assistant

## 2016-11-09 NOTE — Telephone Encounter (Signed)
Ms. Fonseca is calling in reference to [facility] which is a new medication that she is about to start. She would like to have Dr. Tenny Craw' opinion on whether it is okay to start Harvoni or not. Please contact. Thanks.

## 2016-11-09 NOTE — Telephone Encounter (Signed)
Pt called after-hours answering service to ask what appears the same question earlier called to our office. Her liver doctor wants her to start Harvoni and she was asked to inform all of her doctors in case of potential interactions. Dr. Tenny Craw left the following note: "    I would recomm that the pt take harvoni  Very good success rate"      I told the patient to assure her that I would send this message to our pharmacist to review her medication to make sure Harvoni does not interact with any of her cardiac meds, and I will request the pharmacist have Dr. Charlott Rakes nurse call her back with this information. The patient was thankful for the message.  Dayna Dunn PA-C

## 2016-11-09 NOTE — Telephone Encounter (Signed)
Patient would like Dr. Tenny Craw' opinion on starting Harvoni (ledipasvir 90mg  / sofosbuvir 400mg ) for Hep C. She has the medication but will not start the medication until she hears from all her doctors.  She understands Dr. is out of the office and it may be next week before she is called. She was grateful for assistance.

## 2016-11-09 NOTE — Telephone Encounter (Signed)
LMTC x `1 for pt 

## 2016-11-09 NOTE — Telephone Encounter (Signed)
I would recomm that the pt take harvoni  Very good success rate

## 2016-11-10 NOTE — Telephone Encounter (Signed)
Ok for pt to take Harvoni - no major drug interactions with her other cardiac meds.

## 2016-11-14 ENCOUNTER — Telehealth: Payer: Self-pay | Admitting: *Deleted

## 2016-11-14 NOTE — Telephone Encounter (Signed)
Informed patient that Dr. Tenny Craw recommends starting Harvoni. She was grateful for call. She still is not sure she will take it but will let Cardiology know.

## 2016-11-14 NOTE — Telephone Encounter (Signed)
Informed patient that Dr. Tenny Craw recommends starting Harvoni. Reiterated to her that [facility] checked Harvoni with all her meds and it is OK to take.  She was grateful for call. She still is not sure she will take it but will let Cardiology know.

## 2016-11-14 NOTE — Telephone Encounter (Signed)
Pt wants Dr. Latanya Maudlin opinion about taking Harvoni for Hepatitis C.   Pt says her GI MD has prescribed it for her but she is worried about side effects and would like Dr. Latanya Maudlin opinion before she starts taking it.

## 2016-11-14 NOTE — Telephone Encounter (Signed)
Called and spoke with pt and she stated that her other doctor wants to start her on Harvani to treat her hepatitis.  She is wanting to know if MR thinks that this would be good for her to try and take.  MR please advise thanks  Allergies  Allergen Reactions  . Fish Allergy Anaphylaxis, Hives and Swelling  . Peanuts [Peanut Oil] Anaphylaxis, Hives, Itching and Swelling  . Hydrocodone Nausea Only  . Iodinated Diagnostic Agents Hives, Itching and Swelling  . Other     Hypersensitive to perfumes, colognes, powders, lotions, room sprays  . Oxycodone Nausea Only and Other (See Comments)    Sweats and dizziness  . Oxycodone-Acetaminophen Other (See Comments)    unknown  . Penicillins Hives, Itching and Swelling    Has patient had a PCN reaction causing immediate rash, facial/tongue/throat swelling, SOB or lightheadedness with hypotension: Yes Has patient had a PCN reaction causing severe rash involving mucus membranes or skin necrosis: Yes Has patient had a PCN reaction that required hospitalization Yes Has patient had a PCN reaction occurring within the last 10 years: No If all of the above answers are "NO", then may proceed with Cephalosporin use.   Marland Kitchen Percocet [Oxycodone-Acetaminophen] Nausea Only and Other (See Comments)    Raised blood pressure, Sweats, nervous,dizziness  . Sulfa Antibiotics

## 2016-11-15 NOTE — Telephone Encounter (Signed)
All I know about Harvoni is that it can cure Hep C and so from that standpoint - ogood to take. I looked up any lung side effects and , < 5% have cough but I do not think this is real side effect and overall safe   Dr. Kalman Shan, M.D., [facility].C.P Pulmonary and Critical Care Medicine Staff Physician [facility]  Pulmonary and Critical Care Pager: [phone], If no answer or between  15:00h - 7:00h: call [phone]  11/15/2016 8:41 AM

## 2016-11-15 NOTE — Telephone Encounter (Signed)
Spoke with pt, aware of MR's recs.  Nothing further needed.

## 2016-12-11 ENCOUNTER — Other Ambulatory Visit (INDEPENDENT_AMBULATORY_CARE_PROVIDER_SITE_OTHER): Payer: Medicare HMO

## 2016-12-11 ENCOUNTER — Encounter: Payer: Self-pay | Admitting: Internal Medicine

## 2016-12-11 ENCOUNTER — Ambulatory Visit (INDEPENDENT_AMBULATORY_CARE_PROVIDER_SITE_OTHER): Payer: Medicare HMO | Admitting: Internal Medicine

## 2016-12-11 ENCOUNTER — Telehealth: Payer: Self-pay | Admitting: Internal Medicine

## 2016-12-11 VITALS — BP 144/84 | HR 72 | Ht 61.0 in | Wt 232.0 lb

## 2016-12-11 DIAGNOSIS — J849 Interstitial pulmonary disease, unspecified: Secondary | ICD-10-CM

## 2016-12-11 DIAGNOSIS — R0600 Dyspnea, unspecified: Secondary | ICD-10-CM | POA: Diagnosis not present

## 2016-12-11 DIAGNOSIS — R0689 Other abnormalities of breathing: Secondary | ICD-10-CM | POA: Diagnosis not present

## 2016-12-11 DIAGNOSIS — J479 Bronchiectasis, uncomplicated: Secondary | ICD-10-CM

## 2016-12-11 HISTORY — DX: Interstitial pulmonary disease, unspecified: J84.9

## 2016-12-11 LAB — SEDIMENTATION RATE: Sed Rate: 43 mm/hr — ABNORMAL HIGH (ref 0–30)

## 2016-12-11 NOTE — Telephone Encounter (Signed)
Spoke with pt. She stated she had labs put in for another provider that she went ahead an had drawn at the same time as the labs MR wanted. She wanted to make sure that the labs from the other provider be sent to them. I informed her that whatever labs each provider ordered will go directly to them since they were the original person to order them. She understood and will contact us if she has any further questions. Nothing further is needed

## 2016-12-11 NOTE — Progress Notes (Signed)
Subjective:     Patient ID: Anita Gonzalez, female   DOB: [birth year], 71 y.o.   MRN: [id number]  HPI    PCP OSEI-BONSU,GEORGE, MD  HPI  IOV 04/06/2015  Chief Complaint  Patient presents with  . Pulmonary Consult    Pt referred by Raelyn Number, PA for O2 dependence.    71 year old female moved from [place] to [place] in 2016. Has a history of type 2 diabetes, hypertension, hyperlipidemia, asthma not otherwise specified, rheumatoid arthritis, former smoker and significant obesityn. The history if from review of the outside records from 02/03/2015 and review of [facility] medical records from February 2016. And in talking to the patient.  At baseline she is living in [place], [place]. She carries a long-standing diagnosis of asthma for which she takes possibly Advair on a non-compliant basis and albuterol as needed. Because of this she has chronic cough and shortness of breath with exertion and relieved by rest. She moved into December 2015 to [place], [place] to be close to her daughter. Then around 01/06/2015 she developed acute worsening of cough with mucus production and was hospitalized under the tried [employer] for 5 days. Chest x-ray to admission documented below showed right lower lobe consolidation. She was treated as community acquired pneumonia Since then she's been placed on oxygen at discharge. Prior to that she was never on oxygen. She is unaware if she was having exertional hypoxemia prior to this admission. Currently she feels that the pneumonia has resolved and she is back to her baseline health. In the office today when she exerted she did desaturate to 60%. However she does not feel this.   In terms of her past medical history - Smoking: 18.5 prior smoking history  - RA: REports RA NOS - not able to give me details. Not on immune modulator. Not seen rheumatlogist in [place] - ASthma: as above. LAst admit 2 years ago due to aeasthma - ILD -  denies =-OSA: denies testing or diagnosis but appears to be high risk for it  Chest x-ray from 01/06/2015 personally visualized image shows right lower lobe consolidation. Agree with the formal interpretation  Outside lab tests from primary care physician reviewed dated 02/03/2015 ESR 34, ANA negative, rheumatoid factor normal, normal TSH creatinine 0.9 mg percent. Abnormal values include hemoglobin of 9 g percent [in the hospital hemoglobin fluctuated between 7.5 g percent and 8.4 g percent. Most recent hemoglobin 03/26/2015 shows improvement of 10.6 g percent.   has a past medical history of Diabetes mellitus without complication; Asthma; Arthritis; Hypertension; Rheumatoid arthritis (1996); GERD (gastroesophageal reflux disease); Anemia; and Hay fever.   reports that she quit smoking about 4 years ago. Her smoking use included Cigarettes. She has a 18.75 pack-year smoking history. She has never used smokeless tobacco.   04/23/2015 Follow up  Patient returns for two-week follow-up She was seen for new pulmonary consult for dyspnea and hypoxia. Patient was admitted in February for pneumonia and treated with anabiotic's. She was placed on oxygen at discharge due to persistent desaturations. She is a former smoker. Patient was set up for a CT chest that showed mild diffuse bronchiectasis, very mild groundglass attenuation. Atherosclerosis. No evidence of interstitial lung disease. PFT shows restriction with a low diffusing capacity. FEV1 was 1.20 L, 72%, ratio 88, positive broncho-dilator response. Diffusing capacity 35% (PFTdid show patient had difficulty with DLCO component) 2-D echo showed an EF of 60-65%,, grade 1 diastolic dysfunction. Patient says she was seen in [place] by cardiologist but  has not establish here in [place] . Patient is currently on oxygen 2 L with activity and at bedtime. Has been told that she snores and does have some daytime sleepiness. Last visit. Patient had  desaturations with ambulation.  REC Sleep study VQ Cards referral   OV 07/20/2015  Chief Complaint  Patient presents with  . Follow-up    Pt here after OV with TP. Pt had sleep study, VQ scan, and f/u with cards. Pt denies change in breathing. Pt denies cough and CP/tightness.    Fu dyspnea, hypoxemia  - Here for follow-up to review test results. In talking to her I could not figure out if she is better or not. It appears that she might still have baseline shortness of breath. She uses oxygen at random. Unclear how much Advair she is using or how regular she is with it. Below the test results reviewed at this visit  - VQ scan 04/30/2015: Very low probability of pulmonary embolism  - Nuclear medicine myocardial perfusion study 05/07/2015: Normal study with normal stress EKG, ejection fraction 83% an extremely low risk study  - Split night sleep study 06/13/2015: t. Personally visualized image and reports. It appears she has had desaturations and an apnea hypopnea events. Final report by Dr. Kara Mead notes overall 11 apnea hypopnea events per hour. Significant nocturnal desaturations. Final diagnosis is mild obstructive sleep apnea without central sleep apnea and significant nocturnal hypoxemia. Recommendation is for weight loss/CPAP therapy and position therapy during sleep. Loud snoring reported during sleep  FEno - 30ppb and somehwat elevated today 07/20/2015   OV 03/06/16 71 yo female with mild OSA , morbid obesity and Asthma   03/06/2016 Follow up : OSA and Asthma  Patient returns for 62-monthfollow up .  Pt was started on CPAP for mild OSA but says she can only wear for 345mo 1 hr sometimes. She does not want to keep this machine and wants it picked up. We discussed changing mask, pressure setting but she says she is not going to wear this machine.   Says her asthma is not as good. Feels she gets some cough and wheezing on/off. Not sure if pollen and change in weather are  causing this. Remains on Advair Twice daily   Pt had CT cehst in 04/2015 that showed mild diffuse bronchiectasis and few areas of GG attenuation. She has upcoming CT chest planned in 3 months.  We discussed repeat PFT at this time.   She denies chest pain, orthopnea, edema or hemoptysis    TEST  04/2015 CT chest that showed mild diffuse bronchiectasis, very mild groundglass attenuation. Atherosclerosis. No evidence of interstitial lung disease. 6/ 2016 PFT shows restriction with a low diffusing capacity. FEV1 was 1.20 L, 72%, ratio 88, positive broncho-dilator response. Diffusing capacity 35% (PFTdid show patient had difficulty with DLCO component) 2-D echo showed an EF of 60-65%,, grade 1 diastolic dysfunction.     OV 05/30/2016  Chief Complaint  Patient presents with  . Follow-up    Review PFT. Pt reports that SOB is worse since last OV.     Here to review pulmonary function test. 05/30/2016 FEV1 1.02 L/62% FVC 1.16/55% ratio of 88 suggestive of restriction. No broncho-dilator response. Total lung capacity 2.8 L/61% consistent with restriction DLCO 14.85/73% and only mildly low. Off note last visit she was switched to Symbicort from her Advair. Hypoxemic continues. She is on iron infusions through hematology Department. Last echocardiogram 0527/74/12871 diastolic dysfunction. At this visit  she is feeling more short of breath than in the past. Unclear for how long. Review of the phone note shows she has significant desaturations into the 50s very easily without oxygen   OV 12/11/2016  Chief Complaint  Patient presents with  . Follow-up    Pt states her SOB has worsened with activity since last OV. Pt c/o prod cough with white to yellow mucus, chest discomfort with activity- resolved with rest. Pt denies f/c/s.    Follow dyspnea, cough, wheeze in the setting of obesity, asthma history and sleep apnea (uses nocturnal oxygen but does not use CPAP]  Last seen July 2017. After that  she had a high-resolution CT chest that shows air trapping along with some mild ILD. She tells me that since her last visit she's had progressive worsening of shortness of breath. She is dyspneic for changing clothes or climbing flight of stairs or doing ADLs in the house such as changing the bed. It is relieved by rest. Does note shortness of breath when she is at rest. She does have associated cough and wheezing which are helped by the Symbicort and she tells me that dyspnea is not related with Symbicort. Of note she's been diagnosed with hepatitis C in the interim and she is on Harvoni which she is tolerating well. She does have arthralgia and in review and summarization of the 10/12/2016 primary care notes clinically this been diagnosis osteoarthritis she is placed on Tylenol No. 3. Chart review also shows last visit with cardiology was July 2017 with Dr. Harrington Challenger because of a history of coronary artery calcificatin on CT scan and metabolic syndrome. Review of the chart also shows in 2015 or 2016 she did have nuclear medicine cardiac stress test with good ejection fraction and a low risk study  Walking desaturation test 185 feet 3 laps  She had to walk with a cane. She only walked half lap to three fourths lap. Resting pulse ox was 96% with a heart rate of 73. Final pulse ox was 90% with a peak heart rate of 101/m. Possible desaturation if 90% is significant She stopped because of hip pain.     IMPRESSION: 1. Again noted is mild diffuse cylindrical bronchiectasis and thickening of the peribronchovascular interstitium. The subtle areas of ground-glass attenuation and septal thickening in the lungs persist compared to the prior study, but appear unchanged, and may indicate areas of nonspecific interstitial pneumonia (NSIP). 2. Today's study also demonstrates mild air trapping, indicative of small airways disease, and demonstrates evidence of tracheobronchomalacia. In retrospect, the prior expiratory  phase images were unlikely to be truly expiratory on study 04/13/2015. 3. Aortic atherosclerosis, in addition to three-vessel coronary artery disease. Please note that although the presence of coronary artery calcium documents the presence of coronary artery disease, the severity of this disease and any potential stenosis cannot be assessed on this non-gated CT examination. Assessment for potential risk factor modification, dietary therapy or pharmacologic therapy may be warranted, if clinically indicated. 4. Cardiomegaly with mild left atrial dilatation. Electronically Signed   By: Vinnie Langton M.D.   On: 06/09/2016 08:11     has a past medical history of Anemia; Arthritis; Asthma; Bronchitis; Diabetes mellitus without complication ([facility]); GERD (gastroesophageal reflux disease); Hay fever; Hypertension; and Rheumatoid arthritis ([place]) (1996).   reports that she quit smoking about 5 years ago. Her smoking use included Cigarettes. She has a 18.75 pack-year smoking history. She has never used smokeless tobacco.  Past Surgical History:  Procedure Laterality  Date  . ABDOMINAL HYSTERECTOMY    . CESAREAN SECTION    . CESAREAN SECTION    . COLONOSCOPY WITH PROPOFOL N/A 04/02/2015   Procedure: COLONOSCOPY WITH PROPOFOL;  Surgeon: Carol Ada, MD;  Location: [facility];  Service: Endoscopy;  Laterality: N/A;  . ESOPHAGOGASTRODUODENOSCOPY (EGD) WITH PROPOFOL N/A 04/02/2015   Procedure: ESOPHAGOGASTRODUODENOSCOPY (EGD) WITH PROPOFOL;  Surgeon: Carol Ada, MD;  Location: [facility];  Service: Endoscopy;  Laterality: N/A;  . HEMORRHOID SURGERY    . HERNIA REPAIR    . HERNIA REPAIR      Allergies  Allergen Reactions  . Fish Allergy Anaphylaxis, Hives and Swelling  . Peanuts [Peanut Oil] Anaphylaxis, Hives, Itching and Swelling  . Hydrocodone Nausea Only  . Iodinated Diagnostic Agents Hives, Itching and Swelling  . Other     Hypersensitive to perfumes, colognes, powders, lotions,  room sprays  . Oxycodone Nausea Only and Other (See Comments)    Sweats and dizziness  . Oxycodone-Acetaminophen Other (See Comments)    unknown  . Penicillins Hives, Itching and Swelling    Has patient had a PCN reaction causing immediate rash, facial/tongue/throat swelling, SOB or lightheadedness with hypotension: Yes Has patient had a PCN reaction causing severe rash involving mucus membranes or skin necrosis: Yes Has patient had a PCN reaction that required hospitalization Yes Has patient had a PCN reaction occurring within the last 10 years: No If all of the above answers are "NO", then may proceed with Cephalosporin use.   Marland Kitchen Percocet [Oxycodone-Acetaminophen] Nausea Only and Other (See Comments)    Raised blood pressure, Sweats, nervous,dizziness  . Sulfa Antibiotics     Immunization History  Administered Date(s) Administered  . Influenza Split 09/15/2015    Family History  Problem Relation Age of Onset  . Diabetes Mother   . Heart failure Mother   . Throat cancer Brother     throat cancer      Current Outpatient Prescriptions:  .  albuterol (PROVENTIL HFA;VENTOLIN HFA) 108 (90 BASE) MCG/ACT inhaler, Inhale 2 puffs into the lungs every 6 (six) hours as needed for wheezing or shortness of breath (wheezing). , Disp: , Rfl:  .  budesonide-formoterol (SYMBICORT) 160-4.5 MCG/ACT inhaler, Inhale 2 puffs into the lungs 2 (two) times daily., Disp: 1 Inhaler, Rfl: 6 .  bumetanide (BUMEX) 2 MG tablet, Take 2 mg by mouth 2 (two) times a week. Monday and Thursday., Disp: , Rfl:  .  clobetasol (TEMOVATE) 0.05 % GEL, Apply 1 application topically 2 (two) times daily. (Patient taking differently: Apply 1 application topically as needed. ), Disp: 30 each, Rfl: 0 .  cyanocobalamin 1000 MCG tablet, Take 100 mcg by mouth once a week. Monday, Disp: , Rfl:  .  cyclobenzaprine (FLEXERIL) 10 MG tablet, Take 10 mg by mouth daily. , Disp: , Rfl:  .  diclofenac sodium (VOLTAREN) 1 % GEL, Apply  topically 4 (four) times daily., Disp: , Rfl:  .  diphenoxylate-atropine (LOMOTIL) 2.5-0.025 MG tablet, Take by mouth 4 (four) times daily as needed for diarrhea or loose stools., Disp: , Rfl:  .  ergocalciferol (VITAMIN D2) 50000 UNITS capsule, Take 50,000 Units by mouth once a week. Thursday., Disp: , Rfl:  .  ferrous sulfate 325 (65 FE) MG tablet, Take 325 mg by mouth 2 (two) times daily with a meal. , Disp: , Rfl:  .  fluticasone (FLONASE) 50 MCG/ACT nasal spray, Place 1 spray into both nostrils daily as needed for allergies or rhinitis., Disp: , Rfl:  .  folic acid (FOLVITE) 1 MG tablet, Take 1 mg by mouth daily., Disp: , Rfl:  .  ipratropium-albuterol (DUONEB) 0.5-2.5 (3) MG/3ML SOLN, Inhale 3 mLs into the lungs every 6 (six) hours., Disp: , Rfl:  .  meloxicam (MOBIC) 7.5 MG tablet, Take 15 mg by mouth daily. , Disp: , Rfl:  .  metFORMIN (GLUCOPHAGE) 500 MG tablet, Take 500 mg by mouth 2 (two) times daily., Disp: , Rfl:  .  Metoprolol Tartrate 75 MG TABS, Take 1 tablet by mouth daily., Disp: , Rfl:  .  montelukast (SINGULAIR) 10 MG tablet, Take 10 mg by mouth at bedtime., Disp: , Rfl:  .  omeprazole (PRILOSEC) 20 MG capsule, Take 20 mg by mouth daily., Disp: , Rfl:  .  potassium chloride (K-DUR,KLOR-CON) 10 MEQ tablet, Take 10 mEq by mouth every other day. , Disp: , Rfl:  .  senna (SENOKOT) 8.6 MG TABS tablet, Take 8.6 mg by mouth 2 (two) times daily as needed for mild constipation. Reported on 03/06/2016, Disp: , Rfl:  .  simvastatin (ZOCOR) 20 MG tablet, Take 20 mg by mouth every evening. , Disp: , Rfl:  .  valsartan (DIOVAN) 80 MG tablet, Take 1 tablet by mouth daily., Disp: , Rfl:  .  verapamil (VERELAN PM) 360 MG 24 hr capsule, Take 1 capsule (360 mg total) by mouth daily., Disp: 30 capsule, Rfl: 0 .  Multiple Vitamin (MULTIVITAMIN) tablet, Take 1 tablet by mouth daily. With iron., Disp: , Rfl:    Review of Systems     Objective:   Physical Exam  Constitutional: She is oriented to  person, place, and time. She appears well-developed and well-nourished. No distress.  Obese Antalgic gait  HENT:  Head: Normocephalic and atraumatic.  Right Ear: External ear normal.  Left Ear: External ear normal.  Mouth/Throat: Oropharynx is clear and moist. No oropharyngeal exudate.  Eyes: Conjunctivae and EOM are normal. Pupils are equal, round, and reactive to light. Right eye exhibits no discharge. Left eye exhibits no discharge. No scleral icterus.  Neck: Normal range of motion. Neck supple. No JVD present. No tracheal deviation present. No thyromegaly present.  Cardiovascular: Normal rate, regular rhythm, normal heart sounds and intact distal pulses.  Exam reveals no gallop and no friction rub.   No murmur heard. Pulmonary/Chest: Effort normal. No respiratory distress. She has no wheezes. She has rales. She exhibits no tenderness.  Mild crackles at bse  Abdominal: Soft. Bowel sounds are normal. She exhibits no distension and no mass. There is no tenderness. There is no rebound and no guarding.  Musculoskeletal: Normal range of motion. She exhibits no edema, tenderness or deformity.  Uses cane  Lymphadenopathy:    She has no cervical adenopathy.  Neurological: She is alert and oriented to person, place, and time. She has normal reflexes. No cranial nerve deficit. She exhibits normal muscle tone. Coordination normal.  Skin: Skin is warm and dry. No rash noted. She is not diaphoretic. No erythema. No pallor.  Psychiatric: Judgment and thought content normal.  Vitals reviewed.  Vitals:   12/11/16 1233  BP: (!) 144/84  Pulse: 72  SpO2: 95%  Weight: 232 lb (105.2 kg)  Height: '5\' 1"'  (1.549 m)    Estimated body mass index is 43.84 kg/m as calculated from the following:   Height as of this encounter: '5\' 1"'  (1.549 m).   Weight as of this encounter: 232 lb (105.2 kg).     Assessment:       ICD-9-CM ICD-10-CM  1. Dyspnea and respiratory abnormality 786.09 R06.00     R06.89    2. ILD (interstitial lung disease) (HCC) 515 J84.9 ANA     Angiotensin converting enzyme     Sedimentation rate     Anti-DNA antibody, double-stranded     Rheumatoid factor     Cyclic citrul peptide antibody, IgG     Sjogrens syndrome-A extractable nuclear antibody     Sjogrens syndrome-B extractable nuclear antibody     Anti-scleroderma antibody     RNP Antibodies     Aldolase       Plan:      Do Serum: ESR, ACE, ANA, DS-DNA, RF, anti-CCP, ssA, ssB, scl-70, \ RNP, Aldolase,    Based on these results, will advise next step and followup but if these are negative I might ask Dr Harrington Challenger to consider right heart cath  (> 50% of this 15 min visit spent in face to face counseling or/and coordination of care)   Dr. Brand Males, M.D., [facility].C.P Pulmonary and Critical Care Medicine Staff Physician [place] [facility] Pager: [phone], If no answer or between  15:00h - 7:00h: call [phone]  12/29/2016 2:02 AM

## 2016-12-11 NOTE — Telephone Encounter (Signed)
Patient wants to wait to speak to someone -pr

## 2016-12-11 NOTE — Patient Instructions (Signed)
ICD-9-CM ICD-10-CM   1. Dyspnea and respiratory abnormality 786.09 R06.00     R06.89   2. ILD (interstitial lung disease) (HCC) 515 J84.9     Do Serum: ESR, ACE, ANA, DS-DNA, RF, anti-CCP, ssA, ssB, scl-70, _0 RNP, Aldolase,    Based on these results, will advise next step and followup but if these are negative I might ask Dr Harrington Challenger to consider right heart cath

## 2016-12-11 NOTE — Telephone Encounter (Signed)
Patient couldn't wait any longer due to transportation - she would like to have someone call her regarding the labs -pr

## 2016-12-12 LAB — ANA: Anti Nuclear Antibody(ANA): NEGATIVE

## 2016-12-12 LAB — RNP ANTIBODIES: ENA RNP Ab: <0.2 AI (ref 0.0–0.9)

## 2016-12-12 LAB — ANGIOTENSIN CONVERTING ENZYME: ANGIOTENSIN-CONVERTING ENZYME: 32 U/L (ref 9–67)

## 2016-12-12 LAB — RHEUMATOID FACTOR: Rhuematoid fact SerPl-aCnc: <14 IU/mL (ref <14)

## 2016-12-12 LAB — CYCLIC CITRUL PEPTIDE ANTIBODY, IGG: Cyclic Citrullin Peptide Ab: <16 Units

## 2016-12-13 LAB — SJOGRENS SYNDROME-A EXTRACTABLE NUCLEAR ANTIBODY: SSA (Ro) (ENA) Antibody, IgG: <1

## 2016-12-13 LAB — ANTI-DNA ANTIBODY, DOUBLE-STRANDED

## 2016-12-13 LAB — ANTI-SCLERODERMA ANTIBODY: Scleroderma (Scl-70) (ENA) Antibody, IgG: <1

## 2016-12-13 LAB — ALDOLASE: ALDOLASE: 4.2 U/L (ref <=8.1)

## 2016-12-13 LAB — SJOGRENS SYNDROME-B EXTRACTABLE NUCLEAR ANTIBODY: SSB (LA) (ENA) ANTIBODY, IGG: NEGATIVE

## 2016-12-14 ENCOUNTER — Telehealth: Payer: Self-pay | Admitting: Internal Medicine

## 2016-12-14 NOTE — Telephone Encounter (Signed)
Pt aware of results and voiced her understanding. Nothing further needed.  

## 2016-12-14 NOTE — Telephone Encounter (Signed)
lmtcb X1 

## 2016-12-14 NOTE — Telephone Encounter (Signed)
Autoimmune panel is negative. I am copying Dr Tenny Craw her cardiologist in this to consider right heart cath  Thanks  Dr. Kalman Shan, M.D., [facility].C.P Pulmonary and Critical Care Medicine Staff Physician [facility] Pager: [phone], If no answer or between  15:00h - 7:00h: call [phone]  12/14/2016 3:01 PM

## 2016-12-14 NOTE — Telephone Encounter (Signed)
Patient returning call - She can be reached at [phone] -pr

## 2016-12-14 NOTE — Telephone Encounter (Signed)
Spoke with pt. She is requesting her lab results from 12/11/16.  MR - please advise. Thanks.

## 2016-12-20 ENCOUNTER — Telehealth: Payer: Self-pay | Admitting: *Deleted

## 2016-12-20 NOTE — Progress Notes (Signed)
[facility]  [phone]) [phone] [phone]  Clinic Follow up Note   Patient Care Team: [name], MD as PCP - General (Internal Medicine) Jearld Lesch, MD as Referring Physician (Specialist) Jearld Lesch, MD as Referring Physician (Specialist) Jeani Hawking, MD as Consulting Physician (Gastroenterology) Clyde Lundborg., MD as Referring Physician (Anesthesiology) 12/22/2016  CHIEF COMPLAINT:  Follow-up iron deficient anemia  HISTORY OF PRESENTING ILLNESS (01/2015):  Anita Gonzalez 71 y.o. female with history of DM 2, HTN, HLD, asthma, rheumatoid arthritis, former smoker, recently moved to this area from [place], is here because of anemia.   She presented to the ED on 01/06/2015 with fever, chills, productive cough, dyspnea, wheezing, chest pain and poor appetite of ~ 1 week duration. In the ED, febrile to 102.82F, tachycardic, WBC 12.5 and chest x-ray suggestive of right lower lobe pneumonia. Treating for CAP, then developed acute kidney injury-improving and narrow complex tachycardia 2/27. Her clinical condition improved and she was discharged home with oxygen on 01/11/2015. During her hospital stay, she was found to have moderate anemia with hemoglobin 8-9, low serum iron and ferritin level. She was referred to our clinic for further evaluation.  She was found to have abnormal CBC from 2011, and has been on iron pill two pill daily, and B12 injection and pill for the past two years. She had EGD, capsule endoscopy and colonoscopy in [place] a few years ago by Dr. Francella Solian, which was normal per pt.   She denies recent chest pain on exertion, (+) shortness of breath on exertion at night, no pre-syncopal episodes, or palpitations. She had not noticed any recent bleeding such as epistaxis, hematuria or hematochezia The patient denies over the counter NSAID ingestion. She is not on antiplatelets agents. She had no prior history or diagnosis of  cancer. Her age appropriate screening programs are up-to-date. She denies any pica and eats a variety of diet. She never donated blood or received blood transfusion  She is still on oxygen. She has some dyspnea at night. No orthopnea. She is able to do all her ADLs and light housework. She was not very physically active even before the hospitalization last month.  CURRENT THERAPY: 1.  IV Feraheme as need, received in 01/2015, 03/2015 and 09/2015  2. Ferrous sulfate 2 tablets a day  INTERIM HISTORY: [name] returns for follow-up. She is doing moderately well overall. She reports a poor energy level to the point she does not want to get up and go to the bathroom. She reports she is more fatigued than usual. She has shortness of breath. She reports pink colored urine the other day that resolved on its own. The patient is taking her iron supplement as directed.  MEDICAL HISTORY:  Past Medical History:  Diagnosis Date  . Anemia   . Arthritis   . Asthma   . Bronchitis   . Diabetes mellitus without complication (HCC)   . GERD (gastroesophageal reflux disease)   . Hay fever   . Hypertension   . Rheumatoid arthritis (HCC) 1996    SURGICAL HISTORY: Past Surgical History:  Procedure Laterality Date  . ABDOMINAL HYSTERECTOMY    . CESAREAN SECTION    . CESAREAN SECTION    . COLONOSCOPY WITH PROPOFOL N/A 04/02/2015   Procedure: COLONOSCOPY WITH PROPOFOL;  Surgeon: Jeani Hawking, MD;  Location: [facility];  Service: Endoscopy;  Laterality: N/A;  . ESOPHAGOGASTRODUODENOSCOPY (EGD) WITH PROPOFOL N/A 04/02/2015   Procedure: ESOPHAGOGASTRODUODENOSCOPY (EGD) WITH PROPOFOL;  Surgeon:  Jeani Hawking, MD;  Location: [facility];  Service: Endoscopy;  Laterality: N/A;  . HEMORRHOID SURGERY    . HERNIA REPAIR    . HERNIA REPAIR      SOCIAL HISTORY: Social History   Social History  . Marital status: Single    Spouse name: N/A  . Number of children: N/A  . Years of education: N/A    Occupational History  . retired    Social History Main Topics  . Smoking status: Former Smoker    Packs/day: 0.75    Years: 25.00    Types: Cigarettes    Quit date: 01/12/2011  . Smokeless tobacco: Never Used  . Alcohol use 2.4 oz/week    4 Cans of beer per week     Comment: occ  . Drug use: No  . Sexual activity: Not on file   Other Topics Concern  . Not on file   Social History Narrative  . No narrative on file    FAMILY HISTORY: Family History  Problem Relation Age of Onset  . Diabetes Mother   . Heart failure Mother   . Throat cancer Brother     throat cancer     ALLERGIES:  is allergic to fish allergy; peanuts [peanut oil]; hydrocodone; iodinated diagnostic agents; other; oxycodone; oxycodone-acetaminophen; penicillins; percocet [oxycodone-acetaminophen]; and sulfa antibiotics.  MEDICATIONS:  Current Outpatient Prescriptions  Medication Sig Dispense Refill  . albuterol (PROVENTIL HFA;VENTOLIN HFA) 108 (90 BASE) MCG/ACT inhaler Inhale 2 puffs into the lungs every 6 (six) hours as needed for wheezing or shortness of breath (wheezing).     . budesonide-formoterol (SYMBICORT) 160-4.5 MCG/ACT inhaler Inhale 2 puffs into the lungs 2 (two) times daily. 1 Inhaler 6  . bumetanide (BUMEX) 2 MG tablet Take 2 mg by mouth 2 (two) times a week. Monday and Thursday.    . clobetasol (TEMOVATE) 0.05 % GEL Apply 1 application topically 2 (two) times daily. (Patient taking differently: Apply 1 application topically as needed. ) 30 each 0  . cyanocobalamin 1000 MCG tablet Take 100 mcg by mouth once a week. Monday    . cyclobenzaprine (FLEXERIL) 10 MG tablet Take 10 mg by mouth daily.     . diclofenac sodium (VOLTAREN) 1 % GEL Apply topically 4 (four) times daily.    . diphenoxylate-atropine (LOMOTIL) 2.5-0.025 MG tablet Take by mouth 4 (four) times daily as needed for diarrhea or loose stools.    . ergocalciferol (VITAMIN D2) 50000 UNITS capsule Take 50,000 Units by mouth once a week.  Thursday.    . ferrous sulfate 325 (65 FE) MG tablet Take 325 mg by mouth 2 (two) times daily with a meal.     . fluticasone (FLONASE) 50 MCG/ACT nasal spray Place 1 spray into both nostrils daily as needed for allergies or rhinitis.    . folic acid (FOLVITE) 1 MG tablet Take 1 mg by mouth daily.    Marland Kitchen ipratropium-albuterol (DUONEB) 0.5-2.5 (3) MG/3ML SOLN Inhale 3 mLs into the lungs every 6 (six) hours.    . Ledipasvir-Sofosbuvir (HARVONI) 90-400 MG TABS Take by mouth daily.    . meloxicam (MOBIC) 7.5 MG tablet Take 15 mg by mouth daily.     . metFORMIN (GLUCOPHAGE) 500 MG tablet Take 500 mg by mouth 2 (two) times daily.    . Metoprolol Tartrate 75 MG TABS Take 1 tablet by mouth daily.    . montelukast (SINGULAIR) 10 MG tablet Take 10 mg by mouth at bedtime.    . Multiple Vitamin (  MULTIVITAMIN) tablet Take 1 tablet by mouth daily. With iron.    Marland Kitchen omeprazole (PRILOSEC) 20 MG capsule Take 20 mg by mouth daily.    . potassium chloride (K-DUR,KLOR-CON) 10 MEQ tablet Take 10 mEq by mouth every other day.     . senna (SENOKOT) 8.6 MG TABS tablet Take 8.6 mg by mouth 2 (two) times daily as needed for mild constipation. Reported on 03/06/2016    . simvastatin (ZOCOR) 20 MG tablet Take 20 mg by mouth every evening.     . valsartan (DIOVAN) 80 MG tablet Take 1 tablet by mouth daily.    . verapamil (VERELAN PM) 360 MG 24 hr capsule Take 1 capsule (360 mg total) by mouth daily. 30 capsule 0   No current facility-administered medications for this visit.    Facility-Administered Medications Ordered in Other Visits  Medication Dose Route Frequency Provider Last Rate Last Dose  . ferumoxytol (FERAHEME) 510 mg in sodium chloride 0.9 % 100 mL IVPB  510 mg Intravenous Once Malachy Mood, MD        REVIEW OF SYSTEMS:   Constitutional: Denies fevers, chills or abnormal night sweats, (+) fatigue Eyes: Denies blurriness of vision, double vision or watery eyes Ears, nose, mouth, throat, and face: Denies mucositis or  sore throat Respiratory: Denies cough, (+) dyspnea, no wheezes Cardiovascular: Denies palpitation, chest discomfort or lower extremity swelling Gastrointestinal:  Denies nausea, heartburn or change in bowel habits Skin: Denies abnormal skin rashes Lymphatics: Denies new lymphadenopathy or easy bruising Neurological:Denies numbness, tingling or new weaknesses Behavioral/Psych: Mood is stable, no new changes  All other systems were reviewed with the patient and are negative.  PHYSICAL EXAMINATION: ECOG PERFORMANCE STATUS: 3  Vitals:   12/22/16 1307  BP: (!) 179/75  Pulse: 66  Resp: 18  Temp: 98.6 F (37 C)   Filed Weights   12/22/16 1307  Weight: 233 lb (105.7 kg)    GENERAL:alert, no distress and comfortable, in a wheelchair  SKIN: skin color, texture, turgor are normal, no rashes or significant lesions EYES: normal, conjunctiva are pink and non-injected, sclera clear OROPHARYNX:no exudate, no erythema and lips, buccal mucosa, and tongue normal  NECK: supple, thyroid normal size, non-tender, without nodularity LYMPH:  no palpable lymphadenopathy in the cervical, axillary or inguinal LUNGS: clear to auscultation and percussion with normal breathing effort HEART: regular rate & rhythm and no murmurs and no lower extremity edema ABDOMEN:abdomen soft, non-tender and normal bowel sounds Musculoskeletal:no cyanosis of digits and no clubbing (+) Presents in a wheelchair and is ambulatory with a cane. PSYCH: alert & oriented x 3 with fluent speech NEURO: no focal motor/sensory deficits  LABORATORY DATA:  I have reviewed the data as listed CBC Latest Ref Rng & Units 12/22/2016 09/22/2016 06/14/2016  WBC 3.9 - 10.3 10e3/uL 5.7 6.7 5.0  Hemoglobin 11.6 - 15.9 g/dL 16.1 09.6 04.5  Hematocrit 34.8 - 46.6 % 38.0 37.9 38.4  Platelets 145 - 400 10e3/uL 214 278 174    CMP Latest Ref Rng & Units 11/03/2015 10/20/2015 02/25/2015  Glucose 65 - 99 mg/dL 409(W) 119(J) 89  BUN 6 - 20 mg/dL 9 18  47.8  Creatinine 0.44 - 1.00 mg/dL 2.95(A) 2.13(Y) 1.0  Sodium 135 - 145 mmol/L 143 143 142  Potassium 3.5 - 5.1 mmol/L 4.7 3.8 4.0  Chloride 101 - 111 mmol/L 102 106 -  CO2 22 - 32 mmol/L 32 30 30(H)  Calcium 8.9 - 10.3 mg/dL 9.7 9.0 8.8  Total Protein 6.5 - 8.1  g/dL - 6.2(L) 6.8  Total Bilirubin 0.3 - 1.2 mg/dL - 0.5 1.70  Alkaline Phos 38 - 126 U/L - 76 90  AST 15 - 41 U/L - 22 24  ALT 14 - 54 U/L - 32 23   Results for TAWNNI, BESTER (MRN [id number]) as of 12/22/2016 13:05  Ref. Range 06/14/2016 12:29 09/22/2016 10:39 12/22/2016 11:13  Iron Latest Ref Range: 41 - 142 ug/dL 49 55 43  UIBC Latest Ref Range: 120 - 384 ug/dL 759 163 846  TIBC Latest Ref Range: 236 - 444 ug/dL 659 935 701  %SAT Latest Ref Range: 21 - 57 % 16 (L) 17 (L) 13 (L)  Ferritin Latest Ref Range: 9 - 269 ng/ml 149 117 58    RADIOGRAPHIC STUDIES: I have personally reviewed the radiological images as listed and agreed with the findings in the report. No results found.  ASSESSMENT & PLAN: 71 y.o. African-American female with past medical history of diabetes, hypertension, rheumatoid arthritis, who has chronic anemia for a few years.  1. Normocytic anemia secondary to iron deficiency, from GI AVM bleeding  -She has moderate anemia with hemoglobin in 8-9 range, MCV normal, ferritin 16, serum iron 13, URBC elevated at 430, iron saturation 3%, this is consistent with iron deficient anemia -Anemia has been a few years, likely secondary to slow GI bleeding. Her stool OB was positive. Colonoscopy showed AVM, last done in 03/2015. -Her SPEP and UPEP were negative.  -She also may have a component of anemia of chronic disease secondary to kidney disease and rheumatoid arthritis -Her hemoglobin has significantly improved after IV Feraheme, she received IV Feraheme 4 times in 2016, hemoglobin 11.7 TODAY. -Lab results reviewed with her TODAY. Her iron study are within normal limits, ferritin 58. Will give IV Feraheme today and next  week. I encouraged her to continue oral ferrous sulfate 2 tablets a day. -We'll continue monitoring her CBC and iron level, consider IV Feraheme as needed  2. Hypertension, diabetes, rheumatoid arthritis, asthma -She will continue follow-up with her primary care physician and other specialists   Plan -Repeat a CBC, Feraheme and iron study in 3 months with follow up in 6 months  -Consider IV Feraheme if ferritin less than 100 or very low serum iron level - Will give IV Feraheme today and next week.  All questions were answered. The patient knows to call the clinic with any problems, questions or concerns. I spent 10 minutes counseling the patient face to face. The total time spent in the appointment was 15 minutes and more than 50% was on counseling.     Malachy Mood, MD 12/22/16 1:53 PM   This document serves as a record of services personally performed by Malachy Mood, MD. It was created on her behalf by Eustace Moore, a trained medical scribe. The creation of this record is based on the scribe's personal observations and the provider's statements to them. This document has been checked and approved by the attending provider.

## 2016-12-20 NOTE — Telephone Encounter (Signed)
"  Returning a call.  Someone from the office called me."   Called leaving message identified as this patient with appointment information for 12-22-2016 beginning at 12:30 pm.

## 2016-12-20 NOTE — Telephone Encounter (Signed)
Patient returned call.  With this call patient asked for lab draw for a Dr. Timothy Lasso.  Added comment to Lab appointments

## 2016-12-22 ENCOUNTER — Ambulatory Visit (HOSPITAL_BASED_OUTPATIENT_CLINIC_OR_DEPARTMENT_OTHER): Payer: Medicare HMO | Admitting: Hematology

## 2016-12-22 ENCOUNTER — Encounter: Payer: Self-pay | Admitting: Hematology

## 2016-12-22 ENCOUNTER — Other Ambulatory Visit (HOSPITAL_BASED_OUTPATIENT_CLINIC_OR_DEPARTMENT_OTHER): Payer: Medicare HMO

## 2016-12-22 ENCOUNTER — Ambulatory Visit (HOSPITAL_BASED_OUTPATIENT_CLINIC_OR_DEPARTMENT_OTHER): Payer: Medicare HMO

## 2016-12-22 VITALS — BP 159/74 | HR 58 | Temp 98.7°F | Resp 18

## 2016-12-22 VITALS — BP 179/75 | HR 66 | Temp 98.6°F | Resp 18 | Ht 61.0 in | Wt 233.0 lb

## 2016-12-22 DIAGNOSIS — Q2733 Arteriovenous malformation of digestive system vessel: Secondary | ICD-10-CM

## 2016-12-22 DIAGNOSIS — D500 Iron deficiency anemia secondary to blood loss (chronic): Secondary | ICD-10-CM

## 2016-12-22 DIAGNOSIS — I10 Essential (primary) hypertension: Secondary | ICD-10-CM | POA: Diagnosis not present

## 2016-12-22 DIAGNOSIS — D509 Iron deficiency anemia, unspecified: Secondary | ICD-10-CM

## 2016-12-22 DIAGNOSIS — E119 Type 2 diabetes mellitus without complications: Secondary | ICD-10-CM | POA: Diagnosis not present

## 2016-12-22 LAB — IRON AND TIBC
%SAT: 13 % — AB (ref 21–57)
Iron: 43 ug/dL (ref 41–142)
TIBC: 325 ug/dL (ref 236–444)
UIBC: 282 ug/dL (ref 120–384)

## 2016-12-22 LAB — FERRITIN: FERRITIN: 58 ng/mL (ref 9–269)

## 2016-12-22 LAB — CBC WITH DIFFERENTIAL/PLATELET
BASO%: 0.2 % (ref 0.0–2.0)
Basophils Absolute: 0 10*3/uL (ref 0.0–0.1)
EOS%: 3.9 % (ref 0.0–7.0)
Eosinophils Absolute: 0.2 10*3/uL (ref 0.0–0.5)
HCT: 38 % (ref 34.8–46.6)
HEMOGLOBIN: 11.7 g/dL (ref 11.6–15.9)
LYMPH%: 24.5 % (ref 14.0–49.7)
MCH: 30.7 pg (ref 25.1–34.0)
MCHC: 30.8 g/dL — ABNORMAL LOW (ref 31.5–36.0)
MCV: 99.7 fL (ref 79.5–101.0)
MONO#: 0.4 10*3/uL (ref 0.1–0.9)
MONO%: 6.1 % (ref 0.0–14.0)
NEUT%: 65.3 % (ref 38.4–76.8)
NEUTROS ABS: 3.7 10*3/uL (ref 1.5–6.5)
Platelets: 214 10*3/uL (ref 145–400)
RBC: 3.81 10*6/uL (ref 3.70–5.45)
RDW: 13.6 % (ref 11.2–14.5)
WBC: 5.7 10*3/uL (ref 3.9–10.3)
lymph#: 1.4 10*3/uL (ref 0.9–3.3)

## 2016-12-22 MED ORDER — SODIUM CHLORIDE 0.9 % IV SOLN
510.0000 mg | Freq: Once | INTRAVENOUS | Status: AC
  Administered 2016-12-22: 510 mg via INTRAVENOUS
  Filled 2016-12-22: qty 17

## 2016-12-22 NOTE — Patient Instructions (Signed)

## 2016-12-28 ENCOUNTER — Telehealth: Payer: Self-pay | Admitting: Hematology

## 2016-12-28 NOTE — Telephone Encounter (Signed)
S/w pt to give apt for 2/16 ferehame per 2/9 los. Pt states that she cannot come in tomorrow because she comes via transportation and has to give a few days notice. Gv pt appt for 2/21 @ 11am and asked her to collect an appt calendar then. Pt verbalized understand. Pt asks how longit takes to get back lab results. Says her results were to go to Dr. Jacqualine Mau at [facility] (liver doctor) but he has not received them. I advised pt, that I am unsure of labs were drawn and will ask RN to call. Msg for desk nurse.

## 2016-12-29 ENCOUNTER — Telehealth: Payer: Self-pay | Admitting: Internal Medicine

## 2016-12-29 ENCOUNTER — Ambulatory Visit: Payer: Medicare HMO

## 2016-12-29 ENCOUNTER — Telehealth: Payer: Self-pay

## 2016-12-29 NOTE — Telephone Encounter (Signed)
Robynn Pane: please give followupin may 2018  Thanks  Dr. Kalman Shan, M.D., [facility].C.P Pulmonary and Critical Care Medicine Staff Physician [facility] [place] [facility] Pager: [phone], If no answer or between  15:00h - 7:00h: call [phone]  12/29/2016 2:07 AM

## 2016-12-29 NOTE — Telephone Encounter (Signed)
Pt called to find out when her next appt with Dr Mosetta Putt is. Clarified appts with pt.

## 2017-01-01 NOTE — Telephone Encounter (Signed)
[phone] calling someone back

## 2017-01-01 NOTE — Telephone Encounter (Signed)
Spoke with pt. She has been scheduled to see MR on 03/15/17 at 12pm. Nothing further was needed.

## 2017-01-03 ENCOUNTER — Telehealth: Payer: Self-pay | Admitting: Hematology

## 2017-01-03 ENCOUNTER — Ambulatory Visit: Payer: Medicare HMO

## 2017-01-03 ENCOUNTER — Telehealth: Payer: Self-pay

## 2017-01-03 ENCOUNTER — Encounter: Payer: Self-pay | Admitting: Internal Medicine

## 2017-01-03 NOTE — Telephone Encounter (Signed)
Returned call to pt to inform of r/s infusion appt to 3/5 at 1030 am per LOS

## 2017-01-03 NOTE — Telephone Encounter (Signed)
Pt called to r/s appt for infusion today d/t leg cramps since 0500 this morning. Explained iron infusion may help. Her breathing is "same as usual".  She will need 3 day advance notice to arrange a ride. She is free from 3/5 on. inbasket sent.

## 2017-01-05 ENCOUNTER — Telehealth: Payer: Self-pay

## 2017-01-05 NOTE — Telephone Encounter (Signed)
Pt asked for a call back Called back: lvm Called back: pt asking for recommendation for rheumatologist. She doesn't like the one she has now.

## 2017-01-11 NOTE — Progress Notes (Signed)
Cardiology Office Note   Date:  01/12/2017   ID:  Anita Gonzalez, DOB 1946-02-23, MRN [id number]  PCP:  [name], MD  Cardiologist:   Dietrich Pates, MD    F/U of CAD     History of Present Illness: Anita Gonzalez is a 71 y.o. female with a history of CAD on CT scan, HTN,  HL , DM  I saw her in July 2017   PT denies CP  Does have SOB with activity   Current Meds  Medication Sig  . Acetaminophen 500 MG coapsule   . albuterol (PROVENTIL HFA;VENTOLIN HFA) 108 (90 BASE) MCG/ACT inhaler Inhale 2 puffs into the lungs every 6 (six) hours as needed for wheezing or shortness of breath (wheezing).   . budesonide-formoterol (SYMBICORT) 160-4.5 MCG/ACT inhaler Inhale 2 puffs into the lungs 2 (two) times daily.  . bumetanide (BUMEX) 2 MG tablet Take 2 mg by mouth 2 (two) times a week. Monday and Thursday.  . clobetasol (TEMOVATE) 0.05 % GEL Apply 1 application topically 2 (two) times daily. (Patient taking differently: Apply 1 application topically as needed. )  . cyanocobalamin 1000 MCG tablet Take 100 mcg by mouth once a week. Monday  . cyclobenzaprine (FLEXERIL) 10 MG tablet Take 10 mg by mouth daily.   . diclofenac sodium (VOLTAREN) 1 % GEL Apply topically 4 (four) times daily.  . diphenoxylate-atropine (LOMOTIL) 2.5-0.025 MG tablet Take by mouth 4 (four) times daily as needed for diarrhea or loose stools.  . ergocalciferol (VITAMIN D2) 50000 UNITS capsule Take 50,000 Units by mouth once a week. Thursday.  . ferrous sulfate 325 (65 FE) MG tablet Take 325 mg by mouth 2 (two) times daily with a meal.   . fluticasone (FLONASE) 50 MCG/ACT nasal spray Place 1 spray into both nostrils daily as needed for allergies or rhinitis.  . folic acid (FOLVITE) 1 MG tablet Take 1 mg by mouth daily.  Marland Kitchen ipratropium-albuterol (DUONEB) 0.5-2.5 (3) MG/3ML SOLN Inhale 3 mLs into the lungs every 6 (six) hours.  . Ledipasvir-Sofosbuvir (HARVONI) 90-400 MG TABS Take by mouth daily.  . meloxicam (MOBIC) 7.5 MG tablet  Take 15 mg by mouth daily.   . metFORMIN (GLUCOPHAGE) 500 MG tablet Take 500 mg by mouth 2 (two) times daily.  . Metoprolol Tartrate 75 MG TABS Take 1 tablet by mouth daily.  . montelukast (SINGULAIR) 10 MG tablet Take 10 mg by mouth at bedtime.  . Multiple Vitamin (MULTIVITAMIN) tablet Take 1 tablet by mouth daily. With iron.  Marland Kitchen omeprazole (PRILOSEC) 20 MG capsule Take 20 mg by mouth daily.  . potassium chloride (K-DUR,KLOR-CON) 10 MEQ tablet Take 10 mEq by mouth every other day.   . senna (SENOKOT) 8.6 MG TABS tablet Take 8.6 mg by mouth 2 (two) times daily as needed for mild constipation. Reported on 03/06/2016  . simvastatin (ZOCOR) 20 MG tablet Take 20 mg by mouth every evening.   . valsartan (DIOVAN) 80 MG tablet Take 1 tablet by mouth daily.  . verapamil (VERELAN PM) 360 MG 24 hr capsule Take 1 capsule (360 mg total) by mouth daily.     Allergies:   Fish allergy; Peanuts [peanut oil]; Hydrocodone; Iodinated diagnostic agents; Other; Oxycodone; Oxycodone-acetaminophen; Penicillins; Percocet [oxycodone-acetaminophen]; and Sulfa antibiotics   Past Medical History:  Diagnosis Date  . Anemia   . Arthritis   . Asthma   . Bronchitis   . Diabetes mellitus without complication (HCC)   . GERD (gastroesophageal reflux disease)   . Hay fever   .  Hypertension   . Rheumatoid arthritis (HCC) 1996    Past Surgical History:  Procedure Laterality Date  . ABDOMINAL HYSTERECTOMY    . CESAREAN SECTION    . CESAREAN SECTION    . COLONOSCOPY WITH PROPOFOL N/A 04/02/2015   Procedure: COLONOSCOPY WITH PROPOFOL;  Surgeon: Jeani Hawking, MD;  Location: [facility];  Service: Endoscopy;  Laterality: N/A;  . ESOPHAGOGASTRODUODENOSCOPY (EGD) WITH PROPOFOL N/A 04/02/2015   Procedure: ESOPHAGOGASTRODUODENOSCOPY (EGD) WITH PROPOFOL;  Surgeon: Jeani Hawking, MD;  Location: [facility];  Service: Endoscopy;  Laterality: N/A;  . HEMORRHOID SURGERY    . HERNIA REPAIR    . HERNIA REPAIR       Social  History:  The patient  reports that she quit smoking about 6 years ago. Her smoking use included Cigarettes. She has a 18.75 pack-year smoking history. She has never used smokeless tobacco. She reports that she drinks about 2.4 oz of alcohol per week . She reports that she does not use drugs.   Family History:  The patient's family history includes Diabetes in her mother; Heart failure in her mother; Throat cancer in her brother.    ROS:  Please see the history of present illness. All other systems are reviewed and  Negative to the above problem except as noted.    PHYSICAL EXAM: VS:  BP (!) 160/70   Pulse 66   Ht 5\' 1"  (1.549 m)   Wt 234 lb 6.4 oz (106.3 kg)   BMI 44.29 kg/m   GEN: iMorbidly obese 71 yo no acute distress  HEENT: normal  Neck: no JVD, carotid bruits, or masses Cardiac: RRR; no murmurs, rubs, or gallops,Tr edema  Respiratory:  clear to auscultation bilaterally, normal work of breathing GI: soft, nontender, nondistended, + BS  No hepatomegaly  MS: no deformity Moving all extremities   Skin: warm and dry, no rash Neuro:  Strength and sensation are intact Psych: euthymic mood, full affect   EKG:  EKG is ordered today.  SR 66 bpm  RBBB   Lipid Panel    Component Value Date/Time   CHOL 131 05/07/2015 1219   TRIG 79.0 05/07/2015 1219   HDL 41.30 05/07/2015 1219   CHOLHDL 3 05/07/2015 1219   VLDL 15.8 05/07/2015 1219   LDLCALC 74 05/07/2015 1219      Wt Readings from Last 3 Encounters:  01/12/17 234 lb 6.4 oz (106.3 kg)  12/22/16 233 lb (105.7 kg)  12/11/16 232 lb (105.2 kg)      ASSESSMENT AND PLAN:  1  CAD  SOB  With activity  ? If angina   WIll set up for myovue    2  HTN  BP is up  Will increae Valsartan to 160 mg  F/U in 4 to 6 wks  3  HL  Continue medical Rx      Current medicines are reviewed at length with the patient today.  The patient does not have concerns regarding medicines.  Signed, 12/13/16, MD  01/12/2017 2:33 PM    [facility] [address] [place], [place], [place]  [place] Phone: [phone]; Fax: [phone]

## 2017-01-12 ENCOUNTER — Encounter: Payer: Self-pay | Admitting: Internal Medicine

## 2017-01-12 ENCOUNTER — Ambulatory Visit (INDEPENDENT_AMBULATORY_CARE_PROVIDER_SITE_OTHER): Payer: Medicare HMO | Admitting: Internal Medicine

## 2017-01-12 VITALS — BP 160/70 | HR 66 | Ht 61.0 in | Wt 234.4 lb

## 2017-01-12 DIAGNOSIS — R0602 Shortness of breath: Secondary | ICD-10-CM | POA: Diagnosis not present

## 2017-01-12 MED ORDER — VALSARTAN 160 MG PO TABS: 160.0000 mg | ORAL_TABLET | Freq: Every day | ORAL | 3 refills | Status: DC

## 2017-01-12 NOTE — Patient Instructions (Signed)
Your physician has recommended you make the following change in your medication:  1.) increase valsartan to 160 mg once a day  Your physician has requested that you have a lexiscan myoview. For further information please visit [email or web address] Please follow instruction sheet, as given.  Your physician wants you to follow-up in: 4-6 weeks with Dr. Tenny Craw.  You will receive a reminder letter in the mail two months in advance. If you don't receive a letter, please call our office to schedule the follow-up appointment.

## 2017-01-15 ENCOUNTER — Ambulatory Visit (HOSPITAL_BASED_OUTPATIENT_CLINIC_OR_DEPARTMENT_OTHER): Payer: Medicare HMO

## 2017-01-15 VITALS — BP 198/97 | HR 60 | Temp 98.1°F | Resp 20

## 2017-01-15 DIAGNOSIS — D500 Iron deficiency anemia secondary to blood loss (chronic): Secondary | ICD-10-CM

## 2017-01-15 DIAGNOSIS — Q2733 Arteriovenous malformation of digestive system vessel: Secondary | ICD-10-CM | POA: Diagnosis not present

## 2017-01-15 MED ORDER — SODIUM CHLORIDE 0.9 % IV SOLN
510.0000 mg | Freq: Once | INTRAVENOUS | Status: AC
  Administered 2017-01-15: 510 mg via INTRAVENOUS
  Filled 2017-01-15: qty 17

## 2017-01-15 NOTE — Patient Instructions (Signed)

## 2017-01-15 NOTE — Progress Notes (Signed)
MD Mosetta Putt notified of HTN reading post feraheme infusion. Pt states that she has not taken her HTN medication today. Per MD Mosetta Putt, ok to discharge home provided she take her HTN medication at home. Pt instructed to take HTN medication when she gets home. Pt verbalizes understanding and states that she will take her HTN medication at home.

## 2017-01-16 ENCOUNTER — Telehealth: Payer: Self-pay | Admitting: Internal Medicine

## 2017-01-16 NOTE — Telephone Encounter (Signed)
New Message     Please call she would like you to discuss her condition with her daughter Shon Baton , Wants Dr Tenny Craw to call

## 2017-01-19 ENCOUNTER — Telehealth: Payer: Self-pay | Admitting: Internal Medicine

## 2017-01-19 NOTE — Telephone Encounter (Signed)
Follow Up     Pt called wants to go over her condition with Dr Tenny Craw and her daughter Shon Baton, please call at earliest convenience   Ave': [phone] work number

## 2017-01-19 NOTE — Telephone Encounter (Signed)
Reviewed with daughter  She was worried test wasn't safe  I told her she had it done in the past  That test was a safe diagnositc test.  Daughter understands   Also she is interested in motorized chair for mother  I told her that her pulmonary and primary care physicians were better able to request that if needed

## 2017-01-25 ENCOUNTER — Telehealth (HOSPITAL_COMMUNITY): Payer: Self-pay | Admitting: *Deleted

## 2017-01-25 NOTE — Telephone Encounter (Signed)
Patient given detailed instructions per Myocardial Perfusion Study Information Sheet for the test on 01/30/17. Patient notified to arrive 15 minutes early and that it is imperative to arrive on time for appointment to keep from having the test rescheduled.  If you need to cancel or reschedule your appointment, please call the office within 24 hours of your appointment. Failure to do so may result in a cancellation of your appointment, and a $50 no show fee. Patient verbalized understanding.  Anita Gonzalez    

## 2017-01-30 ENCOUNTER — Ambulatory Visit (HOSPITAL_COMMUNITY): Payer: Medicare HMO | Attending: Cardiology

## 2017-01-30 DIAGNOSIS — R0602 Shortness of breath: Secondary | ICD-10-CM

## 2017-01-30 DIAGNOSIS — Z8249 Family history of ischemic heart disease and other diseases of the circulatory system: Secondary | ICD-10-CM | POA: Diagnosis not present

## 2017-01-30 DIAGNOSIS — I4510 Unspecified right bundle-branch block: Secondary | ICD-10-CM | POA: Insufficient documentation

## 2017-01-30 MED ORDER — TECHNETIUM TC 99M TETROFOSMIN IV KIT
33.0000 | PACK | Freq: Once | INTRAVENOUS | Status: AC | PRN
  Administered 2017-01-30: 33 via INTRAVENOUS
  Filled 2017-01-30: qty 33

## 2017-01-30 MED ORDER — REGADENOSON 0.4 MG/5ML IV SOLN
0.4000 mg | Freq: Once | INTRAVENOUS | Status: AC
  Administered 2017-01-30: 0.4 mg via INTRAVENOUS

## 2017-01-31 ENCOUNTER — Ambulatory Visit (HOSPITAL_COMMUNITY): Payer: Medicare HMO | Attending: Internal Medicine

## 2017-01-31 LAB — MYOCARDIAL PERFUSION IMAGING
CHL CUP NUCLEAR SDS: 2
CHL CUP NUCLEAR SRS: 4
LHR: 0.27
LV dias vol: 77 mL (ref 46–106)
LVSYSVOL: 27 mL
NUC STRESS TID: 1.21
Peak HR: 87 {beats}/min
Rest HR: 53 {beats}/min
SSS: 6

## 2017-01-31 MED ORDER — TECHNETIUM TC 99M TETROFOSMIN IV KIT
30.4000 | PACK | Freq: Once | INTRAVENOUS | Status: AC | PRN
  Administered 2017-01-31: 30.4 via INTRAVENOUS
  Filled 2017-01-31: qty 31

## 2017-02-02 ENCOUNTER — Ambulatory Visit (INDEPENDENT_AMBULATORY_CARE_PROVIDER_SITE_OTHER): Payer: Medicare HMO | Admitting: Internal Medicine

## 2017-02-02 ENCOUNTER — Encounter: Payer: Self-pay | Admitting: Internal Medicine

## 2017-02-02 DIAGNOSIS — J4540 Moderate persistent asthma, uncomplicated: Secondary | ICD-10-CM

## 2017-02-02 DIAGNOSIS — R0600 Dyspnea, unspecified: Secondary | ICD-10-CM | POA: Diagnosis not present

## 2017-02-02 DIAGNOSIS — R0689 Other abnormalities of breathing: Secondary | ICD-10-CM

## 2017-02-02 MED ORDER — ACLIDINIUM BROMIDE 400 MCG/ACT IN AEPB: 1.0000 | INHALATION_SPRAY | Freq: Two times a day (BID) | RESPIRATORY_TRACT | 11 refills | Status: DC

## 2017-02-02 NOTE — Patient Instructions (Signed)
Asthma contnue symbicort 2 puff twice daily Add tudorza  1 puff twice daily to see if this will help shortness of breath  Followup 2 months or sooner if needed  Dyspnea and respiratory abnormality This is due to multiple reasons Glad cardiac strese test is normal  Plan Let us see  If adding tudorza helps If not, then we will have to re-discuss Right heart cath) v pulmonry stress test on bike (less preferred; do not think you can do this because of your arthtiris)  Followup 8 weeks with Dr Marchelle Gearing

## 2017-02-02 NOTE — Assessment & Plan Note (Addendum)
This is due to multiple reasons Glad cardiac strese test is normal  Plan Let us see  If adding tudorza helps If not, then we will have to re-discuss Right heart cath) v pulmonry stress test on bike (less preferred; do not think you can do this because of your arthtiris)  Followup 8 weeks with Dr Marchelle Gearing

## 2017-02-02 NOTE — Progress Notes (Signed)
Subjective:     Patient ID: Anita Gonzalez, female   DOB: 1946-10-04, 71 y.o.   MRN: [id number]  HPI      PCP OSEI-BONSU,GEORGE, MD  HPI  IOV 04/06/2015  Chief Complaint  Patient presents with  . Pulmonary Consult    Pt referred by Raelyn Number, PA for O2 dependence.    71 year old female moved from [place] to [place] in 2016. Has a history of type 2 diabetes, hypertension, hyperlipidemia, asthma not otherwise specified, rheumatoid arthritis, former smoker and significant obesityn. The history if from review of the outside records from 02/03/2015 and review of [facility] medical records from February 2016. And in talking to the patient.  At baseline she is living in [place], [place]. She carries a long-standing diagnosis of asthma for which she takes possibly Advair on a non-compliant basis and albuterol as needed. Because of this she has chronic cough and shortness of breath with exertion and relieved by rest. She moved into December 2015 to [place], [place] to be close to her daughter. Then around 01/06/2015 she developed acute worsening of cough with mucus production and was hospitalized under the tried [employer] for 5 days. Chest x-ray to admission documented below showed right lower lobe consolidation. She was treated as community acquired pneumonia Since then she's been placed on oxygen at discharge. Prior to that she was never on oxygen. She is unaware if she was having exertional hypoxemia prior to this admission. Currently she feels that the pneumonia has resolved and she is back to her baseline health. In the office today when she exerted she did desaturate to 60%. However she does not feel this.   In terms of her past medical history - Smoking: 18.5 prior smoking history  - RA: REports RA NOS - not able to give me details. Not on immune modulator. Not seen rheumatlogist in [place] - ASthma: as above. LAst admit 2 years ago due to aeasthma - ILD -  denies =-OSA: denies testing or diagnosis but appears to be high risk for it  Chest x-ray from 01/06/2015 personally visualized image shows right lower lobe consolidation. Agree with the formal interpretation  Outside lab tests from primary care physician reviewed dated 02/03/2015 ESR 34, ANA negative, rheumatoid factor normal, normal TSH creatinine 0.9 mg percent. Abnormal values include hemoglobin of 9 g percent [in the hospital hemoglobin fluctuated between 7.5 g percent and 8.4 g percent. Most recent hemoglobin 03/26/2015 shows improvement of 10.6 g percent.   has a past medical history of Diabetes mellitus without complication; Asthma; Arthritis; Hypertension; Rheumatoid arthritis (1996); GERD (gastroesophageal reflux disease); Anemia; and Hay fever.   reports that she quit smoking about 4 years ago. Her smoking use included Cigarettes. She has a 18.75 pack-year smoking history. She has never used smokeless tobacco.   04/23/2015 Follow up  Patient returns for two-week follow-up She was seen for new pulmonary consult for dyspnea and hypoxia. Patient was admitted in February for pneumonia and treated with anabiotic's. She was placed on oxygen at discharge due to persistent desaturations. She is a former smoker. Patient was set up for a CT chest that showed mild diffuse bronchiectasis, very mild groundglass attenuation. Atherosclerosis. No evidence of interstitial lung disease. PFT shows restriction with a low diffusing capacity. FEV1 was 1.20 L, 72%, ratio 88, positive broncho-dilator response. Diffusing capacity 35% (PFTdid show patient had difficulty with DLCO component) 2-D echo showed an EF of 60-65%,, grade 1 diastolic dysfunction. Patient says she was seen in [place] by  cardiologist but has not establish here in [place] . Patient is currently on oxygen 2 L with activity and at bedtime. Has been told that she snores and does have some daytime sleepiness. Last visit. Patient had  desaturations with ambulation.  REC Sleep study VQ Cards referral   OV 07/20/2015  Chief Complaint  Patient presents with  . Follow-up    Pt here after OV with TP. Pt had sleep study, VQ scan, and f/u with cards. Pt denies change in breathing. Pt denies cough and CP/tightness.    Fu dyspnea, hypoxemia  - Here for follow-up to review test results. In talking to her I could not figure out if she is better or not. It appears that she might still have baseline shortness of breath. She uses oxygen at random. Unclear how much Advair she is using or how regular she is with it. Below the test results reviewed at this visit  - VQ scan 04/30/2015: Very low probability of pulmonary embolism  - Nuclear medicine myocardial perfusion study 05/07/2015: Normal study with normal stress EKG, ejection fraction 83% an extremely low risk study  - Split night sleep study 06/13/2015: t. Personally visualized image and reports. It appears she has had desaturations and an apnea hypopnea events. Final report by Dr. Kara Mead notes overall 11 apnea hypopnea events per hour. Significant nocturnal desaturations. Final diagnosis is mild obstructive sleep apnea without central sleep apnea and significant nocturnal hypoxemia. Recommendation is for weight loss/CPAP therapy and position therapy during sleep. Loud snoring reported during sleep  FEno - 30ppb and somehwat elevated today 07/20/2015   OV 03/06/16 71 yo female with mild OSA , morbid obesity and Asthma   03/06/2016 Follow up : OSA and Asthma  Patient returns for 49-monthfollow up .  Pt was started on CPAP for mild OSA but says she can only wear for 354mo 1 hr sometimes. She does not want to keep this machine and wants it picked up. We discussed changing mask, pressure setting but she says she is not going to wear this machine.   Says her asthma is not as good. Feels she gets some cough and wheezing on/off. Not sure if pollen and change in weather are  causing this. Remains on Advair Twice daily   Pt had CT cehst in 04/2015 that showed mild diffuse bronchiectasis and few areas of GG attenuation. She has upcoming CT chest planned in 3 months.  We discussed repeat PFT at this time.   She denies chest pain, orthopnea, edema or hemoptysis    TEST  04/2015 CT chest that showed mild diffuse bronchiectasis, very mild groundglass attenuation. Atherosclerosis. No evidence of interstitial lung disease. 6/ 2016 PFT shows restriction with a low diffusing capacity. FEV1 was 1.20 L, 72%, ratio 88, positive broncho-dilator response. Diffusing capacity 35% (PFTdid show patient had difficulty with DLCO component) 2-D echo showed an EF of 60-65%,, grade 1 diastolic dysfunction.     OV 05/30/2016  Chief Complaint  Patient presents with  . Follow-up    Review PFT. Pt reports that SOB is worse since last OV.     Here to review pulmonary function test. 05/30/2016 FEV1 1.02 L/62% FVC 1.16/55% ratio of 88 suggestive of restriction. No broncho-dilator response. Total lung capacity 2.8 L/61% consistent with restriction DLCO 14.85/73% and only mildly low. Off note last visit she was switched to Symbicort from her Advair. Hypoxemic continues. She is on iron infusions through hematology Department. Last echocardiogram 0593/90/30091 diastolic dysfunction. At  this visit she is feeling more short of breath than in the past. Unclear for how long. Review of the phone note shows she has significant desaturations into the 50s very easily without oxygen   OV 12/11/2016  Chief Complaint  Patient presents with  . Follow-up    Pt states her SOB has worsened with activity since last OV. Pt c/o prod cough with white to yellow mucus, chest discomfort with activity- resolved with rest. Pt denies f/c/s.    Follow dyspnea, cough, wheeze in the setting of obesity, asthma history and sleep apnea (uses nocturnal oxygen but does not use CPAP]  Last seen July 2017. After that  she had a high-resolution CT chest that shows air trapping along with some POSSIBLE mild ILD. She tells me that since her last visit she's had progressive worsening of shortness of breath. She is dyspneic for changing clothes or climbing flight of stairs or doing ADLs in the house such as changing the bed. It is relieved by rest. Does note shortness of breath when she is at rest. She does have associated cough and wheezing which are helped by the Symbicort and she tells me that dyspnea is not related with Symbicort. Of note she's been diagnosed with hepatitis C in the interim and she is on Harvoni which she is tolerating well. She does have arthralgia and in review and summarization of the 10/12/2016 primary care notes clinically this been diagnosis osteoarthritis she is placed on Tylenol No. 3. Chart review also shows last visit with cardiology was July 2017 with Dr. Harrington Challenger because of a history of coronary artery calcificatin on CT scan and metabolic syndrome. Review of the chart also shows in 2015 or 2016 she did have nuclear medicine cardiac stress test with good ejection fraction and a low risk study  Walking desaturation test 185 feet 3 laps  She had to walk with a cane. She only walked half lap to three fourths lap. Resting pulse ox was 96% with a heart rate of 73. Final pulse ox was 90% with a peak heart rate of 101/m. Possible desaturation if 90% is significant She stopped because of hip pain.     IMPRESSION: 1. Again noted is mild diffuse cylindrical bronchiectasis and thickening of the peribronchovascular interstitium. The subtle areas of ground-glass attenuation and septal thickening in the lungs persist compared to the prior study, but appear unchanged, and may indicate areas of nonspecific interstitial pneumonia (NSIP). 2. Today's study also demonstrates mild air trapping, indicative of small airways disease, and demonstrates evidence of tracheobronchomalacia. In retrospect, the prior  expiratory phase images were unlikely to be truly expiratory on study 04/13/2015. 3. Aortic atherosclerosis, in addition to three-vessel coronary artery disease. Please note that although the presence of coronary artery calcium documents the presence of coronary artery disease, the severity of this disease and any potential stenosis cannot be assessed on this non-gated CT examination. Assessment for potential risk factor modification, dietary therapy or pharmacologic therapy may be warranted, if clinically indicated. 4. Cardiomegaly with mild left atrial dilatation. Electronically Signed   By: Vinnie Langton M.D.   On: 06/09/2016 08:11  OV 02/02/2017  Chief Complaint  Patient presents with  . Follow-up    pt c/o worsening sob with any exertion, prod cough with yellow mucus.    Follow-up dyspnea that is associated with obesity possible diastolic dysfunction, asthma and possible presence of interstitial lung disease  After last visit I ordered autoimmune panel but this was negative. She has a  history of rheumatoid arthritis and a do not know the details why but her RA factor and CCP are normal. She did follow-up with cardiology. Apparently right heart catheterization was discussed but she is nervous about the procedure. She underwent nuclear medicine stress test 2 days ago and this was normal as documented below. She continues to have significant dyspnea. I just walking 8-10 feet in the room and she became dyspneic. She has significant antalgic gait because of obesity and arthritis.  Results for Anita Gonzalez, Anita Gonzalez (MRN [id number]) as of 02/02/2017 11:47  Ref. Range 12/11/2016 13:19  Anit Nuclear Antibody(ANA) Latest Ref Range: NEGATIVE  NEG  Angiotensin-Converting Enzyme Latest Ref Range: 9 - 67 U/L 32  Cyclic Citrullin Peptide Ab Latest Units: Units <16  ds DNA Ab Latest Units: IU/mL <1  ENA RNP Ab Latest Ref Range: 0.0 - 0.9 AI <0.2  RA Latex Turbid. Latest Ref Range: <14 IU/mL <14  SSA (Ro)  (ENA) Antibody, IgG Latest Ref Range: <1.0 NEG AI  <1.0 NEG  SSB (La) (ENA) Antibody, IgG Latest Ref Range: <1.0 NEG AI  <1.0 NEG  Scleroderma (Scl-70) (ENA) Antibody, IgG Latest Ref Range: <1.0 NEG AI  <1.0 NEG    Myocardial perfuslion study 01/12/17   Nuclear stress EF: 66%.  There was no ST segment deviation noted during stress.  This is a low risk study.  The left ventricular ejection fraction is hyperdynamic (>65%).   1. EF 66%, normal wall motion.  2. Fixed, small, mild mid anterior perfusion defect.  Given normal wall motion, suspect soft tissue attenuation rather than infarction.  No ischemia.  3. Low risk study.     has a past medical history of Anemia; Arthritis; Asthma; Bronchitis; Diabetes mellitus without complication ([place]); GERD (gastroesophageal reflux disease); Hay fever; Hypertension; and Rheumatoid arthritis ([place]) (1996).   reports that she quit smoking about 6 years ago. Her smoking use included Cigarettes. She has a 18.75 pack-year smoking history. She has never used smokeless tobacco.  Past Surgical History:  Procedure Laterality Date  . ABDOMINAL HYSTERECTOMY    . CESAREAN SECTION    . CESAREAN SECTION    . COLONOSCOPY WITH PROPOFOL N/A 04/02/2015   Procedure: COLONOSCOPY WITH PROPOFOL;  Surgeon: Carol Ada, MD;  Location: [facility];  Service: Endoscopy;  Laterality: N/A;  . ESOPHAGOGASTRODUODENOSCOPY (EGD) WITH PROPOFOL N/A 04/02/2015   Procedure: ESOPHAGOGASTRODUODENOSCOPY (EGD) WITH PROPOFOL;  Surgeon: Carol Ada, MD;  Location: [facility];  Service: Endoscopy;  Laterality: N/A;  . HEMORRHOID SURGERY    . HERNIA REPAIR    . HERNIA REPAIR      Allergies  Allergen Reactions  . Fish Allergy Anaphylaxis, Hives and Swelling  . Peanuts [Peanut Oil] Anaphylaxis, Hives, Itching and Swelling  . Hydrocodone Nausea Only  . Iodinated Diagnostic Agents Hives, Itching and Swelling  . Other     Hypersensitive to perfumes, colognes, powders, lotions, room  sprays  . Oxycodone Nausea Only and Other (See Comments)    Sweats and dizziness  . Oxycodone-Acetaminophen Other (See Comments)    unknown  . Penicillins Hives, Itching and Swelling    Has patient had a PCN reaction causing immediate rash, facial/tongue/throat swelling, SOB or lightheadedness with hypotension: Yes Has patient had a PCN reaction causing severe rash involving mucus membranes or skin necrosis: Yes Has patient had a PCN reaction that required hospitalization Yes Has patient had a PCN reaction occurring within the last 10 years: No If all of the above answers are "NO", then may proceed with  Cephalosporin use.   Marland Kitchen Percocet [Oxycodone-Acetaminophen] Nausea Only and Other (See Comments)    Raised blood pressure, Sweats, nervous,dizziness  . Sulfa Antibiotics     Immunization History  Administered Date(s) Administered  . Influenza Split 09/15/2015    Family History  Problem Relation Age of Onset  . Diabetes Mother   . Heart failure Mother   . Throat cancer Brother     throat cancer      Current Outpatient Prescriptions:  .  Acetaminophen 500 MG coapsule, , Disp: , Rfl:  .  albuterol (PROVENTIL HFA;VENTOLIN HFA) 108 (90 BASE) MCG/ACT inhaler, Inhale 2 puffs into the lungs every 6 (six) hours as needed for wheezing or shortness of breath (wheezing). , Disp: , Rfl:  .  budesonide-formoterol (SYMBICORT) 160-4.5 MCG/ACT inhaler, Inhale 2 puffs into the lungs 2 (two) times daily., Disp: 1 Inhaler, Rfl: 6 .  bumetanide (BUMEX) 2 MG tablet, Take 2 mg by mouth 2 (two) times a week. Monday and Thursday., Disp: , Rfl:  .  clobetasol (TEMOVATE) 0.05 % GEL, Apply 1 application topically 2 (two) times daily. (Patient taking differently: Apply 1 application topically as needed. ), Disp: 30 each, Rfl: 0 .  cyanocobalamin 1000 MCG tablet, Take 100 mcg by mouth once a week. Monday, Disp: , Rfl:  .  cyclobenzaprine (FLEXERIL) 10 MG tablet, Take 10 mg by mouth daily. , Disp: , Rfl:  .   diclofenac sodium (VOLTAREN) 1 % GEL, Apply topically 4 (four) times daily., Disp: , Rfl:  .  diphenoxylate-atropine (LOMOTIL) 2.5-0.025 MG tablet, Take by mouth 4 (four) times daily as needed for diarrhea or loose stools., Disp: , Rfl:  .  ergocalciferol (VITAMIN D2) 50000 UNITS capsule, Take 50,000 Units by mouth once a week. Thursday., Disp: , Rfl:  .  ferrous sulfate 325 (65 FE) MG tablet, Take 325 mg by mouth 2 (two) times daily with a meal. , Disp: , Rfl:  .  fluticasone (FLONASE) 50 MCG/ACT nasal spray, Place 1 spray into both nostrils daily as needed for allergies or rhinitis., Disp: , Rfl:  .  folic acid (FOLVITE) 1 MG tablet, Take 1 mg by mouth daily., Disp: , Rfl:  .  ipratropium-albuterol (DUONEB) 0.5-2.5 (3) MG/3ML SOLN, Inhale 3 mLs into the lungs every 6 (six) hours., Disp: , Rfl:  .  Ledipasvir-Sofosbuvir (HARVONI) 90-400 MG TABS, Take by mouth daily., Disp: , Rfl:  .  meloxicam (MOBIC) 7.5 MG tablet, Take 15 mg by mouth daily. , Disp: , Rfl:  .  metFORMIN (GLUCOPHAGE) 500 MG tablet, Take 500 mg by mouth 2 (two) times daily., Disp: , Rfl:  .  Metoprolol Tartrate 75 MG TABS, Take 1 tablet by mouth daily., Disp: , Rfl:  .  montelukast (SINGULAIR) 10 MG tablet, Take 10 mg by mouth at bedtime., Disp: , Rfl:  .  Multiple Vitamin (MULTIVITAMIN) tablet, Take 1 tablet by mouth daily. With iron., Disp: , Rfl:  .  omeprazole (PRILOSEC) 20 MG capsule, Take 20 mg by mouth daily., Disp: , Rfl:  .  potassium chloride (K-DUR,KLOR-CON) 10 MEQ tablet, Take 10 mEq by mouth every other day. , Disp: , Rfl:  .  senna (SENOKOT) 8.6 MG TABS tablet, Take 8.6 mg by mouth 2 (two) times daily as needed for mild constipation. Reported on 03/06/2016, Disp: , Rfl:  .  simvastatin (ZOCOR) 20 MG tablet, Take 20 mg by mouth every evening. , Disp: , Rfl:  .  valsartan (DIOVAN) 160 MG tablet, Take 1 tablet (160  mg total) by mouth daily., Disp: 90 tablet, Rfl: 3 .  verapamil (VERELAN PM) 360 MG 24 hr capsule, Take 1  capsule (360 mg total) by mouth daily., Disp: 30 capsule, Rfl: 0    Review of Systems     Objective:   Physical Exam Vitals:   02/02/17 1126  BP: 138/66  Pulse: 77  SpO2: 95%  Weight: 229 lb (103.9 kg)  Height: 5' 1" (1.549 m)    Estimated body mass index is 43.27 kg/m as calculated from the following:   Height as of this encounter: 5' 1" (1.549 m).   Weight as of this encounter: 229 lb (103.9 kg).   Morbidly obese uses a cane. I had to walk less than 10 feet in the room and she had a significantly antalgic gait became short of breath.    Assessment:       ICD-9-CM ICD-10-CM   1. Moderate persistent asthma without complication 115.72 I20.35   2. Dyspnea and respiratory abnormality 786.09 R06.00     R06.89        Plan:     Asthma contnue symbicort 2 puff twice daily Add tudorza  1 puff twice daily to see if this will help shortness of breath  Followup 2 months or sooner if needed  Dyspnea and respiratory abnormality This is due to multiple reasons Glad cardiac strese test is normal  Plan Let us see  If adding tudorza helps If not, then we will have to re-discuss Right heart cath) v pulmonry stress test on bike (less preferred; do not think you can do this because of your arthtiris)  Followup 8 weeks with Dr Chase Caller   (> 50% of this 15 min visit spent in face to face counseling or/and coordination of care)   Dr. Brand Males, M.D., [facility].C.P Pulmonary and Critical Care Medicine Staff Physician [facility] Pager: [phone], If no answer or between  15:00h - 7:00h: call [phone]  02/02/2017 12:00 PM

## 2017-02-02 NOTE — Addendum Note (Signed)
Addended by: Sheran Luz on: 02/02/2017 12:13 PM   Modules accepted: Orders

## 2017-02-02 NOTE — Assessment & Plan Note (Signed)
contnue symbicort 2 puff twice daily Add tudorza  1 puff twice daily to see if this will help shortness of breath  Followup 2 months or sooner if needed

## 2017-02-14 ENCOUNTER — Telehealth: Payer: Self-pay | Admitting: *Deleted

## 2017-02-14 NOTE — Telephone Encounter (Signed)
Pt called and left message requesting a call back from nurse for iron infusion questions.   Called pt back without answer.  Left message on voice mail for pt to call collaborative nurse back. Pt's     Phone      [phone].

## 2017-02-15 ENCOUNTER — Telehealth: Payer: Self-pay | Admitting: Hematology

## 2017-02-15 ENCOUNTER — Telehealth: Payer: Self-pay | Admitting: *Deleted

## 2017-02-15 NOTE — Telephone Encounter (Signed)
Spoke with pt and was informed that pt " feel like my iron is low again. "  Denied shortness of breath - wearing continuous oxygen, eating and drinking fine.  Pt would like to be seen sooner than scheduled appt in August.  Stated just feeling tired, not able to do much due to lack of energy. Instructed pt to come in for labs next week, and Dr. Mosetta Putt will give further instructions.  Pt voiced understanding.

## 2017-02-15 NOTE — Telephone Encounter (Signed)
Spoke with patient re 4/11 appointment

## 2017-02-15 NOTE — Telephone Encounter (Signed)
Patient called stating that she would like to be seen earlier than August. Patient states that she is having continuous SOB, dizziness, weakness, and tiredness.

## 2017-02-21 ENCOUNTER — Other Ambulatory Visit (HOSPITAL_BASED_OUTPATIENT_CLINIC_OR_DEPARTMENT_OTHER): Payer: Medicare HMO

## 2017-02-21 ENCOUNTER — Telehealth: Payer: Self-pay | Admitting: *Deleted

## 2017-02-21 DIAGNOSIS — Q2733 Arteriovenous malformation of digestive system vessel: Secondary | ICD-10-CM | POA: Diagnosis not present

## 2017-02-21 DIAGNOSIS — D500 Iron deficiency anemia secondary to blood loss (chronic): Secondary | ICD-10-CM

## 2017-02-21 DIAGNOSIS — D509 Iron deficiency anemia, unspecified: Secondary | ICD-10-CM

## 2017-02-21 LAB — CBC WITH DIFFERENTIAL/PLATELET
BASO%: 0.9 % (ref 0.0–2.0)
BASOS ABS: 0 10*3/uL (ref 0.0–0.1)
EOS ABS: 0.2 10*3/uL (ref 0.0–0.5)
EOS%: 4.7 % (ref 0.0–7.0)
HCT: 39.6 % (ref 34.8–46.6)
HEMOGLOBIN: 13 g/dL (ref 11.6–15.9)
LYMPH%: 25.8 % (ref 14.0–49.7)
MCH: 32.3 pg (ref 25.1–34.0)
MCHC: 32.9 g/dL (ref 31.5–36.0)
MCV: 98.1 fL (ref 79.5–101.0)
MONO#: 0.3 10*3/uL (ref 0.1–0.9)
MONO%: 7.4 % (ref 0.0–14.0)
NEUT#: 2.9 10*3/uL (ref 1.5–6.5)
NEUT%: 61.2 % (ref 38.4–76.8)
PLATELETS: 241 10*3/uL (ref 145–400)
RBC: 4.04 10*6/uL (ref 3.70–5.45)
RDW: 14.5 % (ref 11.2–14.5)
WBC: 4.7 10*3/uL (ref 3.9–10.3)
lymph#: 1.2 10*3/uL (ref 0.9–3.3)

## 2017-02-21 LAB — IRON AND TIBC
%SAT: 25 % (ref 21–57)
Iron: 71 ug/dL (ref 41–142)
TIBC: 285 ug/dL (ref 236–444)
UIBC: 214 ug/dL (ref 120–384)

## 2017-02-21 LAB — FERRITIN: FERRITIN: 194 ng/mL (ref 9–269)

## 2017-02-21 NOTE — Telephone Encounter (Signed)
Pt here for lab work.  CBC normal.  Informed pt we would call her with results of ferritin & iron.  She reports that she needs at least 3 days to arrange transportation if iron infusion needed.

## 2017-02-21 NOTE — Progress Notes (Signed)
Cardiology Office Note   Date:  02/23/2017   ID:  Anita Gonzalez, DOB [birth year], MRN [id number]  PCP:  [name], MD  Cardiologist:   Dietrich Pates, MD     F/U of CAD and SOB and HTN     History of Present Illness: Anita Gonzalez is a 71 y.o. female with a history of CAD on CT scan, HTN, HL, DM  I saw her in early March 2018 At that visit  I recomm increasing Valsartan to 160 mg    I aslo had her undergo myovue  Myovue was Normal Since seen the pt says her breathing is unhanged  Still gets SOB with exertion   At home BP 148/70      Current Meds  Medication Sig  . Acetaminophen 500 MG coapsule   . albuterol (PROVENTIL HFA;VENTOLIN HFA) 108 (90 BASE) MCG/ACT inhaler Inhale 2 puffs into the lungs every 6 (six) hours as needed for wheezing or shortness of breath (wheezing).   . budesonide-formoterol (SYMBICORT) 160-4.5 MCG/ACT inhaler Inhale 2 puffs into the lungs 2 (two) times daily.  . bumetanide (BUMEX) 2 MG tablet Take 2 mg by mouth 2 (two) times a week. Monday and Thursday.  . clobetasol (TEMOVATE) 0.05 % GEL Apply 1 application topically 2 (two) times daily. (Patient taking differently: Apply 1 application topically as needed. )  . cyanocobalamin 1000 MCG tablet Take 100 mcg by mouth once a week. Monday  . cyclobenzaprine (FLEXERIL) 10 MG tablet Take 10 mg by mouth daily.   . diclofenac sodium (VOLTAREN) 1 % GEL Apply topically 4 (four) times daily.  . diphenoxylate-atropine (LOMOTIL) 2.5-0.025 MG tablet Take by mouth 4 (four) times daily as needed for diarrhea or loose stools.  . ergocalciferol (VITAMIN D2) 50000 UNITS capsule Take 50,000 Units by mouth once a week. Thursday.  . ferrous sulfate 325 (65 FE) MG tablet Take 325 mg by mouth 2 (two) times daily with a meal.   . fluticasone (FLONASE) 50 MCG/ACT nasal spray Place 1 spray into both nostrils daily as needed for allergies or rhinitis.  . folic acid (FOLVITE) 1 MG tablet Take 1 mg by mouth daily.  Marland Kitchen ipratropium-albuterol  (DUONEB) 0.5-2.5 (3) MG/3ML SOLN Inhale 3 mLs into the lungs every 6 (six) hours.  . meloxicam (MOBIC) 7.5 MG tablet Take 15 mg by mouth daily.   . metFORMIN (GLUCOPHAGE) 500 MG tablet Take 500 mg by mouth 2 (two) times daily.  . Metoprolol Tartrate 75 MG TABS Take 1 tablet by mouth daily.  . montelukast (SINGULAIR) 10 MG tablet Take 10 mg by mouth at bedtime.  . Multiple Vitamin (MULTIVITAMIN) tablet Take 1 tablet by mouth daily. With iron.  Marland Kitchen omeprazole (PRILOSEC) 20 MG capsule Take 20 mg by mouth daily.  . potassium chloride (K-DUR,KLOR-CON) 10 MEQ tablet Take 10 mEq by mouth every other day.   . senna (SENOKOT) 8.6 MG TABS tablet Take 8.6 mg by mouth 2 (two) times daily as needed for mild constipation. Reported on 03/06/2016  . simvastatin (ZOCOR) 20 MG tablet Take 20 mg by mouth every evening.   . valsartan (DIOVAN) 160 MG tablet Take 1 tablet (160 mg total) by mouth daily.  . verapamil (VERELAN PM) 360 MG 24 hr capsule Take 1 capsule (360 mg total) by mouth daily.     Allergies:   Fish allergy; Peanuts [peanut oil]; Hydrocodone; Iodinated diagnostic agents; Other; Oxycodone; Oxycodone-acetaminophen; Penicillins; Percocet [oxycodone-acetaminophen]; and Sulfa antibiotics   Past Medical History:  Diagnosis Date  . Anemia   .  Arthritis   . Asthma   . Bronchitis   . Diabetes mellitus without complication (HCC)   . GERD (gastroesophageal reflux disease)   . Hay fever   . Hypertension   . Rheumatoid arthritis (HCC) 1996    Past Surgical History:  Procedure Laterality Date  . ABDOMINAL HYSTERECTOMY    . CESAREAN SECTION    . CESAREAN SECTION    . COLONOSCOPY WITH PROPOFOL N/A 04/02/2015   Procedure: COLONOSCOPY WITH PROPOFOL;  Surgeon: Jeani Hawking, MD;  Location: [facility];  Service: Endoscopy;  Laterality: N/A;  . ESOPHAGOGASTRODUODENOSCOPY (EGD) WITH PROPOFOL N/A 04/02/2015   Procedure: ESOPHAGOGASTRODUODENOSCOPY (EGD) WITH PROPOFOL;  Surgeon: Jeani Hawking, MD;  Location: [facility];  Service: Endoscopy;  Laterality: N/A;  . HEMORRHOID SURGERY    . HERNIA REPAIR    . HERNIA REPAIR       Social History:  The patient  reports that she quit smoking about 6 years ago. Her smoking use included Cigarettes. She has a 18.75 pack-year smoking history. She has never used smokeless tobacco. She reports that she drinks about 2.4 oz of alcohol per week . She reports that she does not use drugs.   Family History:  The patient's family history includes Diabetes in her mother; Heart failure in her mother; Throat cancer in her brother.    ROS:  Please see the history of present illness. All other systems are reviewed and  Negative to the above problem except as noted.    PHYSICAL EXAM: VS:  BP (!) 150/66   Pulse 62   Ht 5\' 1"  (1.549 m)   Wt 230 lb 12.8 oz (104.7 kg)   SpO2 90%   BMI 43.61 kg/m   GEN: Morbidly obese 71 yo in no acute distress  HEENT: normal  Neck: no JVD, carotid bruits, or masses Cardiac: RRR; no murmurs, rubs, or gallops,no edema  Respiratory:  clear to auscultation bilaterally, normal work of breathing GI: soft, nontender, nondistended, + BS  No hepatomegaly  MS: no deformity Moving all extremities   Skin: warm and dry, no rash Neuro:  Strength and sensation are intact Psych: euthymic mood, full affect   EKG:  EKG is not ordered today.   Lipid Panel    Component Value Date/Time   CHOL 131 05/07/2015 1219   TRIG 79.0 05/07/2015 1219   HDL 41.30 05/07/2015 1219   CHOLHDL 3 05/07/2015 1219   VLDL 15.8 05/07/2015 1219   LDLCALC 74 05/07/2015 1219      Wt Readings from Last 3 Encounters:  02/23/17 230 lb 12.8 oz (104.7 kg)  02/02/17 229 lb (103.9 kg)  01/30/17 234 lb (106.1 kg)      ASSESSMENT AND PLAN:  1  HTN  BP is still up  Would increase Valsartin t o320  F/U in 11/2 months  Set up for BMET  2  Dyspnea  I think this most likely due to baseline lung problems plus probable diastolic dysfunction in setting of HTN  Would  increase valsartan  Encouraged her to try to increase her activity    3  HL  Continue statin    Will check lipids    4  Morbid obesity  Disculssed diet/wt loss    I will set to see pt in September 2019    Current medicines are reviewed at length with the patient today.  The patient does not have concerns regarding medicines.  Signed, October 2019, MD  02/23/2017 4:09 PM      [facility] [place], [place], [place]  [address] Phone: [phone]; Fax: [phone]

## 2017-02-23 ENCOUNTER — Ambulatory Visit (INDEPENDENT_AMBULATORY_CARE_PROVIDER_SITE_OTHER): Payer: Medicare HMO | Admitting: Internal Medicine

## 2017-02-23 ENCOUNTER — Encounter: Payer: Self-pay | Admitting: Internal Medicine

## 2017-02-23 VITALS — BP 150/66 | HR 62 | Ht 61.0 in | Wt 230.8 lb

## 2017-02-23 DIAGNOSIS — E782 Mixed hyperlipidemia: Secondary | ICD-10-CM

## 2017-02-23 DIAGNOSIS — I10 Essential (primary) hypertension: Secondary | ICD-10-CM

## 2017-02-23 DIAGNOSIS — R0602 Shortness of breath: Secondary | ICD-10-CM | POA: Diagnosis not present

## 2017-02-23 MED ORDER — VALSARTAN 320 MG PO TABS: 320.0000 mg | ORAL_TABLET | Freq: Every day | ORAL | 3 refills | Status: DC

## 2017-02-23 NOTE — Patient Instructions (Addendum)
Your physician has recommended you make the following change in your medication:  1.) valsartan 320 mg once a day  Your physician recommends that you return for lab work today (BMET, LIPIDS)  Your physician wants you to follow-up in: September, 2019 with Dr. Tenny Craw.  You will receive a reminder letter in the mail two months in advance. If you don't receive a letter, please call our office to schedule the follow-up appointment.

## 2017-02-24 LAB — LIPID PANEL
CHOL/HDL RATIO: 3.4 ratio (ref 0.0–4.4)
Cholesterol, Total: 161 mg/dL (ref 100–199)
HDL: 48 mg/dL (ref >39)
LDL CALC: 92 mg/dL (ref 0–99)
TRIGLYCERIDES: 106 mg/dL (ref 0–149)
VLDL CHOLESTEROL CAL: 21 mg/dL (ref 5–40)

## 2017-02-24 LAB — BASIC METABOLIC PANEL
BUN/Creatinine Ratio: 14 (ref 12–28)
BUN: 15 mg/dL (ref 8–27)
CALCIUM: 9.8 mg/dL (ref 8.7–10.3)
CO2: 32 mmol/L — AB (ref 18–29)
CREATININE: 1.04 mg/dL — AB (ref 0.57–1.00)
Chloride: 98 mmol/L (ref 96–106)
GFR, EST AFRICAN AMERICAN: 63 mL/min/{1.73_m2} (ref >59)
GFR, EST NON AFRICAN AMERICAN: 55 mL/min/{1.73_m2} — AB (ref >59)
Glucose: 97 mg/dL (ref 65–99)
Potassium: 4.5 mmol/L (ref 3.5–5.2)
Sodium: 144 mmol/L (ref 134–144)

## 2017-03-02 ENCOUNTER — Telehealth: Payer: Self-pay | Admitting: *Deleted

## 2017-03-02 ENCOUNTER — Telehealth: Payer: Self-pay | Admitting: Nurse Practitioner

## 2017-03-02 DIAGNOSIS — E782 Mixed hyperlipidemia: Secondary | ICD-10-CM

## 2017-03-02 MED ORDER — ATORVASTATIN CALCIUM 40 MG PO TABS: 40.0000 mg | ORAL_TABLET | Freq: Every day | ORAL | 11 refills | Status: DC

## 2017-03-02 NOTE — Telephone Encounter (Signed)
Reviewed results and plan of care with patient who verbalized understanding and agreement. She is scheduled for repeat lipid panel on 6/21. I advised her to call back with questions or concerns prior to appointment. She thanked me for the call.

## 2017-03-02 NOTE — Telephone Encounter (Signed)
-----   Message from Dietrich Pates V, MD sent at 02/24/2017 11:55 AM EDT ----- Wilfred Curtis function OK  Electrolytes normal Lipids should be better given that she has evid of atherosclerosis  Goal LDL 70 or less Would switch to lipitor 40  F/U lipid panel in 8 weeks after starting

## 2017-03-02 NOTE — Telephone Encounter (Signed)
Received call from pt asking about results of labs.  Reviewed & per Dr Latanya Maudlin note, labs adequate & no need for iron infusion at this time.  Informed pt.  She states she still feels tired/fatigued.  Instructed to call her PCP to see if there are other reasons for her fatigue.

## 2017-03-05 ENCOUNTER — Telehealth: Payer: Self-pay | Admitting: Cardiovascular Disease

## 2017-03-05 NOTE — Telephone Encounter (Signed)
Spoke with patient who called to ask me to review the lab results and plan of care that I called her with on Friday. I reviewed the information and she recited it back to me. She thanked me for the call.

## 2017-03-05 NOTE — Telephone Encounter (Signed)
New Message ° °Pt voiced she is returning nurses call. ° °Please f/u °

## 2017-03-06 ENCOUNTER — Telehealth: Payer: Self-pay | Admitting: Internal Medicine

## 2017-03-06 DIAGNOSIS — J849 Interstitial pulmonary disease, unspecified: Secondary | ICD-10-CM

## 2017-03-06 NOTE — Telephone Encounter (Signed)
Called and spoke with pt and she stated that she would like to get an air purifier to use in her bedroom.  She stated that she uses [employer].  Pt stated that she has a lot of allergies and she needs this to use in the bedroom.  MR please advise. Thanks  Allergies  Allergen Reactions  . Fish Allergy Anaphylaxis, Hives and Swelling  . Peanuts [Peanut Oil] Anaphylaxis, Hives, Itching and Swelling  . Hydrocodone Nausea Only  . Iodinated Diagnostic Agents Hives, Itching and Swelling  . Other     Hypersensitive to perfumes, colognes, powders, lotions, room sprays  . Oxycodone Nausea Only and Other (See Comments)    Sweats and dizziness  . Oxycodone-Acetaminophen Other (See Comments)    unknown  . Penicillins Hives, Itching and Swelling    Has patient had a PCN reaction causing immediate rash, facial/tongue/throat swelling, SOB or lightheadedness with hypotension: Yes Has patient had a PCN reaction causing severe rash involving mucus membranes or skin necrosis: Yes Has patient had a PCN reaction that required hospitalization Yes Has patient had a PCN reaction occurring within the last 10 years: No If all of the above answers are "NO", then may proceed with Cephalosporin use.   Marland Kitchen Percocet [Oxycodone-Acetaminophen] Nausea Only and Other (See Comments)    Raised blood pressure, Sweats, nervous,dizziness  . Sulfa Antibiotics

## 2017-03-07 NOTE — Telephone Encounter (Signed)
Order sent to [facility]  Pt aware and nothing further needed 

## 2017-03-07 NOTE — Telephone Encounter (Signed)
Yes that is fine for air purifier  Dr. Kalman Shan, M.D., [facility].C.P Pulmonary and Critical Care Medicine Staff Physician [facility] [place] [facility] Pager: [phone], If no answer or between  15:00h - 7:00h: call [phone]  03/07/2017 11:35 AM

## 2017-03-14 ENCOUNTER — Telehealth: Payer: Self-pay | Admitting: Internal Medicine

## 2017-03-14 NOTE — Telephone Encounter (Signed)
Patient returned call, CB is [phone].

## 2017-03-14 NOTE — Telephone Encounter (Signed)
lmom tcb x1  Spoke with [facility] and they stated they have tried to call pt to inform her that they do not offer this and that [facility] will not cover this.

## 2017-03-14 NOTE — Telephone Encounter (Signed)
Message:    She wants to know if anybody have contacted you about her Air Purifier,she said she talked to somebody here last week about this.

## 2017-03-14 NOTE — Telephone Encounter (Signed)
Computer pulled this back up for documentation

## 2017-03-14 NOTE — Telephone Encounter (Signed)
Spoke with pt, aware of below recs.  Nothing further needed.  

## 2017-03-15 ENCOUNTER — Ambulatory Visit: Payer: Medicare HMO | Admitting: Internal Medicine

## 2017-03-18 ENCOUNTER — Other Ambulatory Visit: Payer: Self-pay | Admitting: Adult Health

## 2017-03-21 ENCOUNTER — Telehealth: Payer: Self-pay | Admitting: *Deleted

## 2017-03-21 NOTE — Telephone Encounter (Signed)
Spoke with pt and was informed that pt is currently out of town due to family illness.  Pt does not know when she will be back in town. Has lab appt only for 03/22/17.  Instructed pt to call office to reschedule lab appt when pt is back in town.  Pt voiced understanding. Pt's    Phone     [phone].

## 2017-03-22 ENCOUNTER — Other Ambulatory Visit: Payer: Medicare HMO

## 2017-03-30 ENCOUNTER — Ambulatory Visit: Payer: Medicare HMO | Admitting: Internal Medicine

## 2017-04-05 ENCOUNTER — Telehealth: Payer: Self-pay | Admitting: Internal Medicine

## 2017-04-05 ENCOUNTER — Ambulatory Visit: Payer: Medicare HMO | Admitting: Registered"

## 2017-04-05 DIAGNOSIS — J9611 Chronic respiratory failure with hypoxia: Secondary | ICD-10-CM

## 2017-04-05 DIAGNOSIS — E669 Obesity, unspecified: Secondary | ICD-10-CM

## 2017-04-05 NOTE — Telephone Encounter (Signed)
Order placed for motorized wheelchair. Pt aware. Nothing further needed.

## 2017-04-05 NOTE — Telephone Encounter (Signed)
Ok that is fine to order Art gallery manager  Dr. Kalman Shan, M.D., [facility].C.P Pulmonary and Critical Care Medicine Staff Physician [facility] [place] [facility] Pager: [phone], If no answer or between  15:00h - 7:00h: call [phone]  04/05/2017 4:08 PM

## 2017-04-05 NOTE — Telephone Encounter (Signed)
Called and spoke with pt and she stated that she twisted her ankle yesterday and spoke with someone within [facility] advised them about her ankle and that she cannot control the manual wheelchair due to her breathing.  They advised her to have MR write a letter for her stating that the electric wheelchair would benefit her.  MR please advise. Thanks  Allergies  Allergen Reactions  . Fish Allergy Anaphylaxis, Hives and Swelling  . Peanuts [Peanut Oil] Anaphylaxis, Hives, Itching and Swelling  . Hydrocodone Nausea Only  . Iodinated Diagnostic Agents Hives, Itching and Swelling  . Other     Hypersensitive to perfumes, colognes, powders, lotions, room sprays  . Oxycodone Nausea Only and Other (See Comments)    Sweats and dizziness  . Oxycodone-Acetaminophen Other (See Comments)    unknown  . Penicillins Hives, Itching and Swelling    Has patient had a PCN reaction causing immediate rash, facial/tongue/throat swelling, SOB or lightheadedness with hypotension: Yes Has patient had a PCN reaction causing severe rash involving mucus membranes or skin necrosis: Yes Has patient had a PCN reaction that required hospitalization Yes Has patient had a PCN reaction occurring within the last 10 years: No If all of the above answers are "NO", then may proceed with Cephalosporin use.   Marland Kitchen Percocet [Oxycodone-Acetaminophen] Nausea Only and Other (See Comments)    Raised blood pressure, Sweats, nervous,dizziness  . Sulfa Antibiotics

## 2017-04-05 NOTE — Telephone Encounter (Signed)
Obesity Chronic hypoxemic respiratory failure  .Dr. Kalman Shan, M.D., [facility].C.P Pulmonary and Critical Care Medicine Staff Physician [facility] [place] [facility] Critical [facility] Pager: [phone], If no answer or between  15:00h - 7:00h: call [phone]  04/05/2017 4:27 PM

## 2017-04-05 NOTE — Telephone Encounter (Signed)
Just to clarify before placing order. What would you like order associated with?

## 2017-04-10 ENCOUNTER — Other Ambulatory Visit: Payer: Self-pay | Admitting: Internal Medicine

## 2017-04-11 ENCOUNTER — Ambulatory Visit: Payer: Medicare HMO | Admitting: Internal Medicine

## 2017-04-11 NOTE — Telephone Encounter (Signed)
MR please advise if ok to refill this medication.  thanks

## 2017-04-13 ENCOUNTER — Telehealth: Payer: Self-pay

## 2017-04-13 NOTE — Telephone Encounter (Signed)
Pt called b/c she missed labs 5/10. Asking to r/s. Any day next week will be OK. Pt prefers afternoon. In basket sent.

## 2017-04-17 ENCOUNTER — Ambulatory Visit: Payer: Medicare HMO | Admitting: Internal Medicine

## 2017-04-23 ENCOUNTER — Ambulatory Visit: Payer: Medicare HMO | Admitting: Registered"

## 2017-04-23 ENCOUNTER — Telehealth: Payer: Self-pay | Admitting: *Deleted

## 2017-04-23 NOTE — Telephone Encounter (Signed)
"  I need to reschedule my lab appointment to Friday.  I am waiting for Medicaid card access.  I cannot arrange transportation until I talk to someone about this."  Scheduler notified to reschedule lab to Friday ad requested by patient.

## 2017-04-25 ENCOUNTER — Other Ambulatory Visit: Payer: Medicare HMO

## 2017-04-27 ENCOUNTER — Other Ambulatory Visit (HOSPITAL_BASED_OUTPATIENT_CLINIC_OR_DEPARTMENT_OTHER): Payer: Medicare HMO

## 2017-04-27 DIAGNOSIS — D509 Iron deficiency anemia, unspecified: Secondary | ICD-10-CM

## 2017-04-27 LAB — CBC WITH DIFFERENTIAL/PLATELET
BASO%: 0.5 % (ref 0.0–2.0)
Basophils Absolute: 0 10*3/uL (ref 0.0–0.1)
EOS ABS: 0.2 10*3/uL (ref 0.0–0.5)
EOS%: 2.5 % (ref 0.0–7.0)
HCT: 39.8 % (ref 34.8–46.6)
HGB: 13 g/dL (ref 11.6–15.9)
LYMPH%: 27.3 % (ref 14.0–49.7)
MCH: 32 pg (ref 25.1–34.0)
MCHC: 32.7 g/dL (ref 31.5–36.0)
MCV: 97.9 fL (ref 79.5–101.0)
MONO#: 0.7 10*3/uL (ref 0.1–0.9)
MONO%: 10.1 % (ref 0.0–14.0)
NEUT#: 3.8 10*3/uL (ref 1.5–6.5)
NEUT%: 59.6 % (ref 38.4–76.8)
PLATELETS: 231 10*3/uL (ref 145–400)
RBC: 4.06 10*6/uL (ref 3.70–5.45)
RDW: 13.6 % (ref 11.2–14.5)
WBC: 6.4 10*3/uL (ref 3.9–10.3)
lymph#: 1.8 10*3/uL (ref 0.9–3.3)

## 2017-04-27 LAB — FERRITIN: FERRITIN: 141 ng/mL (ref 9–269)

## 2017-04-27 LAB — IRON AND TIBC
%SAT: 18 % — ABNORMAL LOW (ref 21–57)
Iron: 53 ug/dL (ref 41–142)
TIBC: 293 ug/dL (ref 236–444)
UIBC: 240 ug/dL (ref 120–384)

## 2017-05-03 ENCOUNTER — Other Ambulatory Visit: Payer: Medicare HMO

## 2017-05-04 ENCOUNTER — Telehealth: Payer: Self-pay | Admitting: *Deleted

## 2017-05-04 NOTE — Telephone Encounter (Signed)
Called & left message for pt that iron level sufficient & no need for IV  iron now per Dr Mosetta Putt.

## 2017-05-04 NOTE — Telephone Encounter (Signed)
-----   Message from Malachy Mood, MD sent at 04/30/2017 11:57 PM EDT ----- Please call pt and let her know the lab result, her iron level is adequate, no need iv iron for now.   Malachy Mood  04/30/2017

## 2017-05-07 ENCOUNTER — Telehealth: Payer: Self-pay | Admitting: Internal Medicine

## 2017-05-07 NOTE — Telephone Encounter (Signed)
New Message     Pt is returning your call , please call her back

## 2017-05-07 NOTE — Telephone Encounter (Signed)
Left detailed message (DPR) that I do not see results for her recently.  Advised she was due for lipids but it looks like she didn't come for that lab appointment. (NO SHOW).  However she had some labs by Dr. Mosetta Putt.  I advised that she contact their office for those results and to call back to cardiology if needed anything further.    Pt called me back.  She went to [facility] (downstairs) for bloodwork for Dr. Tenny Craw but was told her labs were going to be for Dr. Binnie Kand per patient.  She does not know who this is.   Advised I will contact [facility] to try to get more information and I will call her back.

## 2017-05-07 NOTE — Telephone Encounter (Signed)
New message    Pt is calling about lab resutls. Please call.

## 2017-05-07 NOTE — Telephone Encounter (Signed)
Called [facility] they do not carry motorized wheelchairs so I have sent the order to [facility] called pt and she is aware.

## 2017-05-08 ENCOUNTER — Other Ambulatory Visit: Payer: Self-pay | Admitting: Nurse Practitioner

## 2017-05-08 DIAGNOSIS — K7469 Other cirrhosis of liver: Secondary | ICD-10-CM

## 2017-05-08 NOTE — Telephone Encounter (Signed)
Spoke with [facility].  Pt had labs unrelated to cholesterol drawn on 6/21.  The order is still in EPIC for lipids.  Left detailed message to call back and schedule lipids.

## 2017-05-09 ENCOUNTER — Ambulatory Visit: Payer: Medicare HMO | Admitting: Registered"

## 2017-05-09 ENCOUNTER — Encounter: Payer: Medicare HMO | Attending: Internal Medicine | Admitting: Registered"

## 2017-05-09 ENCOUNTER — Encounter: Payer: Self-pay | Admitting: Registered"

## 2017-05-09 DIAGNOSIS — Z713 Dietary counseling and surveillance: Secondary | ICD-10-CM | POA: Insufficient documentation

## 2017-05-09 DIAGNOSIS — I10 Essential (primary) hypertension: Secondary | ICD-10-CM | POA: Insufficient documentation

## 2017-05-09 DIAGNOSIS — E119 Type 2 diabetes mellitus without complications: Secondary | ICD-10-CM

## 2017-05-09 NOTE — Progress Notes (Signed)
  Medical Nutrition Therapy:  Appt start time: 2:10 end time:  3:15.   Assessment:  Primary concerns today: "To learn how to eat right as related to hypertension." Pt states she needs another blood glucose testing kit and unable to get PCP to send in prescription.   Pt has hearing aid in right ear. Pt is unable to walk long distances and needs wheelchair assistance. Pt states she lives with daughter and son-in-law. Pt reports she still does her own shopping and wants to stay independent. Pt states she checks BS every other day. Pt states she has not received diabetes education and has had diabetes for about 9 years. Pt states she likes kale, spinach, lima beans, cabbage, brussel sprouts, broccoli, and collard greens. Pt states she seasons vegetables with smoked pork neck bones, ham hock and adds bacon grease and/or salt to season food daily. Pt states she also likes some fruit: grapes, watermelon, canned pears, fruit cocktail. Pt reports bananas and raw apples cause throat itching. Pt states daughter and son-in-law also shop and prepare meals for her as well.   Preferred Learning Style:   No preference indicated   Learning Readiness:   Ready  Change in progress   MEDICATIONS: See list   DIETARY INTAKE:  Usual eating pattern includes 2 meals and 1-2 snacks per day.  Avoided foods include bananas and raw apples.    24-hr recall:  B ( AM): skips  Snk ( AM): none L ( PM): cereal Snk ( PM): graham crackers, cheese crackers, ice cream D ( PM): fried pork chops, french fries, bread Snk ( PM): none Beverages: sweet tea, coffee, soda, water, lactaid 2% cheese  Usual physical activity: limited   Estimated energy needs: 1600 calories 180 g carbohydrates 120 g protein 44 g fat  Progress Towards Goal(s):  In progress.   Nutritional Diagnosis:  NB-1.1 Food and nutrition-related knowledge deficit As related to hypertension.  As evidenced by pt report of salt used to season food. .     Intervention:  Nutrition educating and couseling. RD educated pt on hypoglycemia signs and symptoms. RD educated pt on hypertension and ways to reduce sodium intake.   Goals: - Signs and symptoms of low blood sugar/hypoglycemia: sweating, dizziness, shakiness, irritability, fast heartbeat, confusion. Eat/drink fast-acting sugar such as: a few pieces of hard candy, juice, regular soda and check blood sugar 15 minutes later.   - Aim to keep sodium less than 215m per serving on nutrition facts label.  - Snack options include low-fat cheese, salad, fruit with protein source.  - Measure amounts of bacon grease when adding to season vegetables.  - Aim to eat 3 meals per day.   Teaching Method Utilized:  Visual Auditory Hands on  Handouts given during visit include:  Planning Healthy Meals   Barriers to learning/adherence to lifestyle change: limited independence for shopping and cooking   Demonstrated degree of understanding via:  Teach Back   Monitoring/Evaluation:  Dietary intake, exercise, and body weight in 3 week(s).

## 2017-05-09 NOTE — Patient Instructions (Addendum)
-   Signs and symptoms of low blood sugar/hypoglycemia: sweating, dizziness, shakiness, irritability, fast heartbeat, confusion. Eat/drink fast-acting sugar such as: a few pieces of hard candy, juice, regular soda and check blood sugar 15 minutes later.    - Aim to keep sodium less than 200mg  per serving on nutrition facts label.   - Snack options include low-fat cheese, salad, fruit with protein source.   - Measure amounts of bacon grease when adding to season vegetables.   - Aim to eat 3 meals per day.

## 2017-05-21 ENCOUNTER — Ambulatory Visit
Admission: RE | Admit: 2017-05-21 | Discharge: 2017-05-21 | Disposition: A | Discharge status: Home / Self Care | Payer: Medicare HMO | Source: Ambulatory Visit | Attending: Nurse Practitioner | Admitting: Nurse Practitioner

## 2017-05-21 DIAGNOSIS — K7460 Unspecified cirrhosis of liver: Secondary | ICD-10-CM | POA: Diagnosis not present

## 2017-05-21 DIAGNOSIS — K8020 Calculus of gallbladder without cholecystitis without obstruction: Secondary | ICD-10-CM | POA: Diagnosis not present

## 2017-05-21 DIAGNOSIS — K7469 Other cirrhosis of liver: Secondary | ICD-10-CM

## 2017-05-21 DIAGNOSIS — K760 Fatty (change of) liver, not elsewhere classified: Secondary | ICD-10-CM | POA: Diagnosis not present

## 2017-05-22 DIAGNOSIS — E782 Mixed hyperlipidemia: Secondary | ICD-10-CM | POA: Diagnosis not present

## 2017-05-22 DIAGNOSIS — B182 Chronic viral hepatitis C: Secondary | ICD-10-CM | POA: Diagnosis not present

## 2017-05-22 DIAGNOSIS — M10072 Idiopathic gout, left ankle and foot: Secondary | ICD-10-CM | POA: Diagnosis not present

## 2017-05-23 DIAGNOSIS — M659 Synovitis and tenosynovitis, unspecified: Secondary | ICD-10-CM | POA: Diagnosis not present

## 2017-05-23 DIAGNOSIS — M79672 Pain in left foot: Secondary | ICD-10-CM | POA: Diagnosis not present

## 2017-05-29 ENCOUNTER — Telehealth: Payer: Self-pay | Admitting: Registered"

## 2017-05-29 DIAGNOSIS — E785 Hyperlipidemia, unspecified: Secondary | ICD-10-CM | POA: Diagnosis not present

## 2017-05-29 DIAGNOSIS — E119 Type 2 diabetes mellitus without complications: Secondary | ICD-10-CM | POA: Diagnosis not present

## 2017-05-29 DIAGNOSIS — M069 Rheumatoid arthritis, unspecified: Secondary | ICD-10-CM | POA: Diagnosis not present

## 2017-05-29 DIAGNOSIS — B1920 Unspecified viral hepatitis C without hepatic coma: Secondary | ICD-10-CM | POA: Diagnosis not present

## 2017-05-29 DIAGNOSIS — J45909 Unspecified asthma, uncomplicated: Secondary | ICD-10-CM | POA: Diagnosis not present

## 2017-05-29 DIAGNOSIS — Z0000 Encounter for general adult medical examination without abnormal findings: Secondary | ICD-10-CM | POA: Diagnosis not present

## 2017-05-29 DIAGNOSIS — I10 Essential (primary) hypertension: Secondary | ICD-10-CM | POA: Diagnosis not present

## 2017-05-29 DIAGNOSIS — G8929 Other chronic pain: Secondary | ICD-10-CM | POA: Diagnosis not present

## 2017-05-29 DIAGNOSIS — G4733 Obstructive sleep apnea (adult) (pediatric): Secondary | ICD-10-CM | POA: Diagnosis not present

## 2017-05-30 ENCOUNTER — Ambulatory Visit: Payer: Medicare HMO | Admitting: Registered"

## 2017-06-04 DIAGNOSIS — Z8709 Personal history of other diseases of the respiratory system: Secondary | ICD-10-CM | POA: Diagnosis not present

## 2017-06-04 DIAGNOSIS — Z8739 Personal history of other diseases of the musculoskeletal system and connective tissue: Secondary | ICD-10-CM | POA: Diagnosis not present

## 2017-06-04 DIAGNOSIS — R0902 Hypoxemia: Secondary | ICD-10-CM | POA: Diagnosis not present

## 2017-06-04 DIAGNOSIS — J449 Chronic obstructive pulmonary disease, unspecified: Secondary | ICD-10-CM | POA: Diagnosis not present

## 2017-06-07 ENCOUNTER — Telehealth: Payer: Self-pay | Admitting: *Deleted

## 2017-06-07 DIAGNOSIS — E782 Mixed hyperlipidemia: Secondary | ICD-10-CM

## 2017-06-07 MED ORDER — EZETIMIBE 10 MG PO TABS: 10.0000 mg | ORAL_TABLET | Freq: Every day | ORAL | 3 refills | Status: DC

## 2017-06-07 NOTE — Telephone Encounter (Signed)
-----   Message from Pricilla Riffle, MD sent at 05/25/2017  5:15 PM EDT ----- Lipids are worse now on more statin agent  ? Is she taking    If she is I would add Zetia 10  F/U lipids and AST in 8 wks   Confirm taking

## 2017-06-08 NOTE — Telephone Encounter (Signed)
Pt called wanting to reschedule upcoming appt. RD informed receptionist to call pt to reschedule.

## 2017-06-10 DIAGNOSIS — J45909 Unspecified asthma, uncomplicated: Secondary | ICD-10-CM | POA: Diagnosis not present

## 2017-06-10 DIAGNOSIS — J9600 Acute respiratory failure, unspecified whether with hypoxia or hypercapnia: Secondary | ICD-10-CM | POA: Diagnosis not present

## 2017-06-10 DIAGNOSIS — J189 Pneumonia, unspecified organism: Secondary | ICD-10-CM | POA: Diagnosis not present

## 2017-06-10 DIAGNOSIS — D509 Iron deficiency anemia, unspecified: Secondary | ICD-10-CM | POA: Diagnosis not present

## 2017-06-11 ENCOUNTER — Telehealth: Payer: Self-pay | Admitting: Internal Medicine

## 2017-06-11 NOTE — Telephone Encounter (Signed)
Lm to call back ./cy 

## 2017-06-11 NOTE — Telephone Encounter (Signed)
Patient calling, states that she has some questions about ezetimibe medication. Patient was reading over the information and states that she does not know if she is allergic to medication or not.c

## 2017-06-11 NOTE — Telephone Encounter (Signed)
Left message to call back  

## 2017-06-11 NOTE — Telephone Encounter (Signed)
Follow up      Pt c/o medication issue:  1. Name of Medication:  ezetimibe 2. How are you currently taking this medication (dosage and times per day)? 10mg  daily 3. Are you having a reaction (difficulty breathing--STAT)? no 4. What is your medication issue? Pt was recently prescribed this medication.  Calling to talk to the nurse to see if she can take this medication along with her other meds and what side effects she should look for

## 2017-06-11 NOTE — Telephone Encounter (Signed)
New Message    Pt would like to know how is she to know if she is allergic to ezetimibe ? She was to start medication Saturday but the paperwork says if you are allergic to it do not take it.

## 2017-06-12 ENCOUNTER — Ambulatory Visit: Payer: Medicare HMO | Admitting: Registered"

## 2017-06-12 NOTE — Telephone Encounter (Signed)
Pt concerned because she read package insert that came with Zetia that said do not take if you are allergic to this medication. Pt advised we do not have Zetia listed as a medication allergy, pt states she does not remember taking in the past. Pt advised to go ahead and start Zetia, watch for rash, itching, allergy symptoms, call if she does feel she is tolerating once she starts medication. Pt verbalized understanding.

## 2017-06-12 NOTE — Telephone Encounter (Signed)
F/u message  Pt returning RN call about med issue. Please call back to discuss

## 2017-06-13 ENCOUNTER — Telehealth: Payer: Self-pay

## 2017-06-13 NOTE — Telephone Encounter (Signed)
Pt called requesting to move appt to later in day if possible. She needs to know 4 days in advance to scheduler her ride. inbasket sent urgent so scheduler can look at this. Did not guarantee later time.

## 2017-06-14 ENCOUNTER — Telehealth: Payer: Self-pay | Admitting: Hematology

## 2017-06-14 NOTE — Telephone Encounter (Signed)
lvm to inform pt that 8/9 appts could not be moved to afternoon per sch msg. Instructed pt to return call if afternoon appt is needed. Next available ib 8/20 at 2 pm

## 2017-06-18 ENCOUNTER — Telehealth: Payer: Self-pay | Admitting: Internal Medicine

## 2017-06-18 MED ORDER — IRBESARTAN 300 MG PO TABS: 300.0000 mg | ORAL_TABLET | Freq: Every day | ORAL | 3 refills | Status: DC

## 2017-06-18 NOTE — Telephone Encounter (Signed)
Will change valsartan 320mg  daily to irbesartan 300mg  daily. Pt aware to monitor BP over the next few weeks.

## 2017-06-18 NOTE — Telephone Encounter (Signed)
New message      Pt c/o medication issue:  1. Name of Medication: valsartan  2. How are you currently taking this medication (dosage and times per day)? 320mg   3. Are you having a reaction (difficulty breathing--STAT)?  no  4. What is your medication issue?  Pt states medication has been recalled.  Please advise

## 2017-06-21 ENCOUNTER — Ambulatory Visit: Payer: Medicare HMO | Admitting: Hematology

## 2017-06-21 ENCOUNTER — Other Ambulatory Visit: Payer: Medicare HMO

## 2017-06-25 ENCOUNTER — Encounter: Payer: Medicare HMO | Attending: Internal Medicine | Admitting: Registered"

## 2017-06-25 DIAGNOSIS — I10 Essential (primary) hypertension: Secondary | ICD-10-CM | POA: Insufficient documentation

## 2017-06-25 DIAGNOSIS — Z713 Dietary counseling and surveillance: Secondary | ICD-10-CM | POA: Insufficient documentation

## 2017-06-25 DIAGNOSIS — E119 Type 2 diabetes mellitus without complications: Secondary | ICD-10-CM | POA: Diagnosis not present

## 2017-06-25 NOTE — Progress Notes (Signed)
Medical Nutrition Therapy:  Appt start time:  1545 end time:  1630.  Assessment:  Primary concerns today: Pt is here for follow-up to help with eating specific for managing diabetes. Pt states she does not know her A1c number.  Pt states she sporadically checks her BG before she eats and will take again 3-4 hours later if she remembers to see what her sugar does. Pt reports that she check both her right and left hand and writes down the higher number. Pt states that before dinner yesterday it was R 119, L 109 and after it was up to 120. Pt states she takes metformin as prescribed 500 mg 2x day.   Pt reports she does not get hungry during the day and will usually wait until lunch sometimes late dinner before eating. Pt states at time of visit (4 pm) all she has had was a piece of toast which she made herself eat earlier in the day. Pt had a snack with her and RD encouraged her to go ahead and eat.   Pt states she lives with daughter and son-in-law and they do the cooking for her.   Pt states she is not sleeping well and has had insomnia for a long time.   Preferred Learning Style:   No preference indicated   Learning Readiness:   Ready  Change in progress  MEDICATIONS: See list   DIETARY INTAKE:  Usual eating pattern includes 2 meals and 1-2 snacks per day.  Avoided foods include bananas and raw apples.    24-hr recall:  B ( AM): skips  Snk ( AM): none L (1-5 PM): just when she gets hungry. cereal Snk ( PM): graham crackers, cheese crackers, ice cream D ( PM): mashed potatoes, greens, chicken (baked, fried, boiled) OR ground beef. Snk ( PM): none Beverages: 14 oz sweet tea at night, coffee or hot cocoa, soda, water, lactaid 2% cheese  Usual physical activity: limited: uses cane and when walking gets SOB, starts wheezing, and her hips hurt. Pt states she put arm chair exercise handout on her wall to remind to do arm chair exercises  Estimated energy needs: 1600 calories 180 g  carbohydrates 120 g protein 44 g fat  Progress Towards Goal(s):  In progress.   Nutritional Diagnosis:  NB-1.1 Food and nutrition-related knowledge deficit As related to purpose of structured meals for energy and BG control.  As evidenced by pt reported not wanting to eat earlier in the day, and eating large dinners to prevent being hungry later.    Intervention:  Nutrition education. Discussed purpose and timing of checking blood sugar. Discussed purpose of structured meals. Discussed importance of including movement in her daily routine.   Goals:  Check fasting blood sugar 2-3 week in the morning before you eat instead of before each meal.  Eat something earlier in the day, pears and a hard boiled egg would make a light meal/snack  Continue eating greens on a regular basis.  Consider doing arm chair exercises while watching TV  Think about cutting back on the sweet tea.  Teaching Method Utilized:  Visual Auditory Hands on  Handouts given during visit include:  none  Barriers to learning/adherence to lifestyle change: limited independence for shopping and cooking. Pt states she is used to her timing of meals and would be hard to change   Demonstrated degree of understanding via:  Teach Back   Monitoring/Evaluation:  Dietary intake, exercise, and body weight prn.

## 2017-06-25 NOTE — Patient Instructions (Addendum)
   Check fasting blood sugar 2-3 week in the morning before you eat instead of before each meal.  Eat something earlier in the day, pears and a hard boiled egg would make a light meal/snack  Continue eating greens on a regular basis.  Consider doing arm chair exercises while watching TV  Think about cutting back on the sweet tea.

## 2017-07-02 ENCOUNTER — Ambulatory Visit: Payer: Medicare HMO | Admitting: Hematology

## 2017-07-02 ENCOUNTER — Other Ambulatory Visit: Payer: Medicare HMO

## 2017-07-04 ENCOUNTER — Telehealth: Payer: Self-pay | Admitting: Internal Medicine

## 2017-07-04 ENCOUNTER — Ambulatory Visit: Payer: Medicare HMO | Admitting: Internal Medicine

## 2017-07-04 NOTE — Telephone Encounter (Signed)
Left message for patient to call back  

## 2017-07-04 NOTE — Telephone Encounter (Signed)
Pt returning call to Cherina.

## 2017-07-04 NOTE — Telephone Encounter (Signed)
Spoke with pt, states that she feels too sick to come in to office today.  I asked pt why she was feeling ill- pt states it is d/t her asthma.  I advised pt that if she is having an asthma exacerbation that this is where she needs to be seen.  Pt states she does not feel well enough to drive herself here, and has no other mode of transportation.  Pt wishes to reschedule to next week where she can arrange for transportation.  Pt rescheduled to see MW next Thursday, and advised pt to seek UC/ED if s/s worsen between now and then.  Pt expressed understanding.  Nothing further needed at this time .

## 2017-07-09 ENCOUNTER — Other Ambulatory Visit: Payer: Medicare HMO

## 2017-07-09 ENCOUNTER — Encounter: Payer: Medicare HMO | Admitting: Hematology

## 2017-07-09 ENCOUNTER — Telehealth: Payer: Self-pay | Admitting: *Deleted

## 2017-07-09 NOTE — Telephone Encounter (Signed)
Pt called requesting to talk to nurse.  Pt was NO SHOW for appt today.  Spoke with pt, and was informed that pt could not come in today due to fell off the bed.  Pt was limping and did not feel like coming in.  Pt wished to reschedule.  Advised pt to call the scheduler in the morning to rescheduling appts.  Pt voiced understanding. Pt's    Phone     [phone].

## 2017-07-10 NOTE — Progress Notes (Signed)
This encounter was created in error - please disregard.

## 2017-07-11 ENCOUNTER — Telehealth: Payer: Self-pay | Admitting: Hematology

## 2017-07-11 NOTE — Telephone Encounter (Signed)
SW PT TO CONFIRM 9/11 APPT AT 130 PM PER SCH MSG

## 2017-07-12 ENCOUNTER — Ambulatory Visit: Payer: Medicare HMO | Admitting: Internal Medicine

## 2017-07-13 ENCOUNTER — Emergency Department (HOSPITAL_COMMUNITY)
Admission: EM | Admit: 2017-07-13 | Discharge: 2017-07-14 | Disposition: A | Discharge status: Home / Self Care | Payer: Medicare HMO | Attending: Emergency Medicine | Admitting: Emergency Medicine

## 2017-07-13 ENCOUNTER — Encounter (HOSPITAL_COMMUNITY): Payer: Self-pay | Admitting: *Deleted

## 2017-07-13 ENCOUNTER — Emergency Department (HOSPITAL_COMMUNITY): Payer: Medicare HMO

## 2017-07-13 DIAGNOSIS — R197 Diarrhea, unspecified: Secondary | ICD-10-CM | POA: Diagnosis not present

## 2017-07-13 DIAGNOSIS — E119 Type 2 diabetes mellitus without complications: Secondary | ICD-10-CM | POA: Insufficient documentation

## 2017-07-13 DIAGNOSIS — I10 Essential (primary) hypertension: Secondary | ICD-10-CM | POA: Diagnosis not present

## 2017-07-13 DIAGNOSIS — M25552 Pain in left hip: Secondary | ICD-10-CM | POA: Diagnosis not present

## 2017-07-13 DIAGNOSIS — W19XXXA Unspecified fall, initial encounter: Secondary | ICD-10-CM

## 2017-07-13 DIAGNOSIS — Z79899 Other long term (current) drug therapy: Secondary | ICD-10-CM | POA: Diagnosis not present

## 2017-07-13 DIAGNOSIS — J45909 Unspecified asthma, uncomplicated: Secondary | ICD-10-CM | POA: Insufficient documentation

## 2017-07-13 DIAGNOSIS — J449 Chronic obstructive pulmonary disease, unspecified: Secondary | ICD-10-CM | POA: Diagnosis not present

## 2017-07-13 DIAGNOSIS — R0781 Pleurodynia: Secondary | ICD-10-CM | POA: Insufficient documentation

## 2017-07-13 DIAGNOSIS — Z87891 Personal history of nicotine dependence: Secondary | ICD-10-CM | POA: Insufficient documentation

## 2017-07-13 DIAGNOSIS — M10072 Idiopathic gout, left ankle and foot: Secondary | ICD-10-CM | POA: Diagnosis not present

## 2017-07-13 DIAGNOSIS — M1000 Idiopathic gout, unspecified site: Secondary | ICD-10-CM

## 2017-07-13 DIAGNOSIS — Z7984 Long term (current) use of oral hypoglycemic drugs: Secondary | ICD-10-CM | POA: Diagnosis not present

## 2017-07-13 DIAGNOSIS — W06XXXA Fall from bed, initial encounter: Secondary | ICD-10-CM | POA: Insufficient documentation

## 2017-07-13 DIAGNOSIS — M545 Low back pain: Secondary | ICD-10-CM | POA: Insufficient documentation

## 2017-07-13 DIAGNOSIS — M79672 Pain in left foot: Secondary | ICD-10-CM | POA: Diagnosis present

## 2017-07-13 LAB — COMPREHENSIVE METABOLIC PANEL
ALK PHOS: 85 U/L (ref 38–126)
ALT: 13 U/L — ABNORMAL LOW (ref 14–54)
ANION GAP: 12 (ref 5–15)
AST: 15 U/L (ref 15–41)
Albumin: 3.5 g/dL (ref 3.5–5.0)
BUN: 12 mg/dL (ref 6–20)
CHLORIDE: 97 mmol/L — AB (ref 101–111)
CO2: 30 mmol/L (ref 22–32)
CREATININE: 1.09 mg/dL — AB (ref 0.44–1.00)
Calcium: 9.3 mg/dL (ref 8.9–10.3)
GFR calc non Af Amer: 51 mL/min — ABNORMAL LOW (ref >60)
GFR, EST AFRICAN AMERICAN: 59 mL/min — AB (ref >60)
GLUCOSE: 101 mg/dL — AB (ref 65–99)
Potassium: 3.3 mmol/L — ABNORMAL LOW (ref 3.5–5.1)
SODIUM: 139 mmol/L (ref 135–145)
Total Bilirubin: 0.5 mg/dL (ref 0.3–1.2)
Total Protein: 6.6 g/dL (ref 6.5–8.1)

## 2017-07-13 LAB — CBC
HCT: 36.6 % (ref 36.0–46.0)
Hemoglobin: 11.1 g/dL — ABNORMAL LOW (ref 12.0–15.0)
MCH: 29.8 pg (ref 26.0–34.0)
MCHC: 30.3 g/dL (ref 30.0–36.0)
MCV: 98.4 fL (ref 78.0–100.0)
PLATELETS: 248 10*3/uL (ref 150–400)
RBC: 3.72 MIL/uL — ABNORMAL LOW (ref 3.87–5.11)
RDW: 13.4 % (ref 11.5–15.5)
WBC: 5 10*3/uL (ref 4.0–10.5)

## 2017-07-13 LAB — URINALYSIS, ROUTINE W REFLEX MICROSCOPIC
Bilirubin Urine: NEGATIVE
GLUCOSE, UA: NEGATIVE mg/dL
Hgb urine dipstick: NEGATIVE
Ketones, ur: NEGATIVE mg/dL
Nitrite: NEGATIVE
PH: 6 (ref 5.0–8.0)
Protein, ur: NEGATIVE mg/dL
SPECIFIC GRAVITY, URINE: 1.008 (ref 1.005–1.030)
SQUAMOUS EPITHELIAL / LPF: NONE SEEN

## 2017-07-13 LAB — LIPASE, BLOOD: Lipase: 24 U/L (ref 11–51)

## 2017-07-13 MED ORDER — SODIUM CHLORIDE 0.9 % IV SOLN: INTRAVENOUS | Status: DC

## 2017-07-13 MED ORDER — SODIUM CHLORIDE 0.9 % IV BOLUS (SEPSIS)
1000.0000 mL | Freq: Once | INTRAVENOUS | Status: AC
  Administered 2017-07-13: 1000 mL via INTRAVENOUS

## 2017-07-13 MED ORDER — PREDNISONE 50 MG PO TABS: ORAL_TABLET | ORAL | 0 refills | Status: DC

## 2017-07-13 MED ORDER — LORAZEPAM 2 MG/ML IJ SOLN
0.5000 mg | Freq: Once | INTRAMUSCULAR | Status: AC
  Administered 2017-07-13: 0.5 mg via INTRAVENOUS
  Filled 2017-07-13: qty 1

## 2017-07-13 MED ORDER — PREDNISONE 20 MG PO TABS
60.0000 mg | ORAL_TABLET | Freq: Once | ORAL | Status: AC
  Administered 2017-07-13: 60 mg via ORAL
  Filled 2017-07-13: qty 3

## 2017-07-13 NOTE — ED Triage Notes (Signed)
Pt reports having a fall one week ago, injured left foot when she fell. Has redness and swelling to left big toe. Reports having pelvic pain since she fell, dark stools and diarrhea. Denies being on blood thinners.

## 2017-07-13 NOTE — ED Notes (Signed)
Pt transported to xray on stretcher with tech.

## 2017-07-13 NOTE — Discharge Instructions (Signed)
Start taking the prednisone tomorrow. Return here for any problems

## 2017-07-13 NOTE — ED Provider Notes (Signed)
[facility]-EMERGENCY DEPT Provider Note   CSN: [id number] Arrival date & time: 07/13/17  0815     History   Chief Complaint Chief Complaint  Patient presents with  . Fall  . Diarrhea    HPI Sharnae Winfree is a 71 y.o. female.  71 year old female presents complaining of lower back pain that is bilateral as well as left hip pain after following week ago. Also notes left foot discomfort. States that she rolled out of bed during her sleep and struck a piece of plastic. No loss of consciousness. No head or neck discomfort. Does note some left lateral rib pain without dyspnea. Also notes dark green diarrhea without fever or chills. No vomiting noted. No recent antibiotic use. States she's been somewhat weak when standing up and denies any urinary symptoms.      Past Medical History:  Diagnosis Date  . Anemia   . Arthritis   . Asthma   . Bronchitis   . COPD (chronic obstructive pulmonary disease) (HCC)   . Diabetes mellitus without complication (HCC)   . GERD (gastroesophageal reflux disease)   . Hay fever   . Hypertension   . Rheumatoid arthritis (HCC) 1996    Patient Active Problem List   Diagnosis Date Noted  . ILD (interstitial lung disease) (HCC) 12/11/2016  . Bronchiectasis (HCC) 03/07/2016  . Asthma 03/06/2016  . Leg cramps, sleep related 12/01/2015  . OSA (obstructive sleep apnea) 07/20/2015  . Dyspnea and respiratory abnormality 04/06/2015  . Hypoxemia 04/06/2015  . History of rheumatoid arthritis 04/06/2015  . History of smoking 04/06/2015  . Anemia, iron deficiency 01/28/2015  . Acute kidney injury (HCC) 01/11/2015  . E-coli UTI 01/11/2015  . Sinus tachycardia 01/11/2015  . Anemia 01/06/2015  . Essential hypertension 01/06/2015  . DM II (diabetes mellitus, type II), controlled (HCC) 01/06/2015  . HLD (hyperlipidemia) 01/06/2015  . Rheumatoid arthritis (HCC) 01/06/2015    Past Surgical History:  Procedure Laterality Date  . ABDOMINAL HYSTERECTOMY    .  CESAREAN SECTION    . CESAREAN SECTION    . COLONOSCOPY WITH PROPOFOL N/A 04/02/2015   Procedure: COLONOSCOPY WITH PROPOFOL;  Surgeon: Jeani Hawking, MD;  Location: [facility];  Service: Endoscopy;  Laterality: N/A;  . ESOPHAGOGASTRODUODENOSCOPY (EGD) WITH PROPOFOL N/A 04/02/2015   Procedure: ESOPHAGOGASTRODUODENOSCOPY (EGD) WITH PROPOFOL;  Surgeon: Jeani Hawking, MD;  Location: [facility];  Service: Endoscopy;  Laterality: N/A;  . HEMORRHOID SURGERY    . HERNIA REPAIR    . HERNIA REPAIR      OB History    No data available       Home Medications    Prior to Admission medications   Medication Sig Start Date End Date Taking? Authorizing Provider  Acetaminophen 500 MG coapsule  12/26/16   [provider]  albuterol (PROVENTIL HFA;VENTOLIN HFA) 108 (90 BASE) MCG/ACT inhaler Inhale 2 puffs into the lungs every 6 (six) hours as needed for wheezing or shortness of breath (wheezing).     [provider]  atorvastatin (LIPITOR) 40 MG tablet Take 1 tablet (40 mg total) by mouth daily. 03/02/17 05/31/17  Pricilla Riffle, MD  bumetanide (BUMEX) 2 MG tablet Take 2 mg by mouth 2 (two) times a week. Monday and Thursday.    [provider]  clobetasol (TEMOVATE) 0.05 % GEL Apply 1 application topically 2 (two) times daily. Patient taking differently: Apply 1 application topically as needed.  12/21/14   Muthersbaugh, Dahlia Client, PA-C  cyanocobalamin 1000 MCG tablet Take 100 mcg by  mouth once a week. Monday    [provider]  cyclobenzaprine (FLEXERIL) 10 MG tablet Take 10 mg by mouth daily.     [provider]  diclofenac sodium (VOLTAREN) 1 % GEL Apply topically 4 (four) times daily.    [provider]  diphenoxylate-atropine (LOMOTIL) 2.5-0.025 MG tablet Take by mouth 4 (four) times daily as needed for diarrhea or loose stools.    [provider]  ergocalciferol (VITAMIN D2) 50000 UNITS capsule Take 50,000 Units by mouth once a week. Thursday.     [provider]  ezetimibe (ZETIA) 10 MG tablet Take 1 tablet (10 mg total) by mouth daily. 06/07/17 09/05/17  Pricilla Riffle, MD  ferrous sulfate 325 (65 FE) MG tablet Take 325 mg by mouth 2 (two) times daily with a meal.     [provider]  fluticasone (FLONASE) 50 MCG/ACT nasal spray Place 1 spray into both nostrils daily as needed for allergies or rhinitis.    [provider]  folic acid (FOLVITE) 1 MG tablet Take 1 mg by mouth daily.    [provider]  ipratropium-albuterol (DUONEB) 0.5-2.5 (3) MG/3ML SOLN Inhale 3 mLs into the lungs every 6 (six) hours. 10/08/15   [provider]  irbesartan (AVAPRO) 300 MG tablet Take 1 tablet (300 mg total) by mouth daily. 06/18/17   Pricilla Riffle, MD  meloxicam (MOBIC) 7.5 MG tablet Take 15 mg by mouth daily.     [provider]  metFORMIN (GLUCOPHAGE) 500 MG tablet Take 500 mg by mouth 2 (two) times daily. 05/05/15   [provider]  Metoprolol Tartrate 75 MG TABS Take 1 tablet by mouth daily.    [provider]  montelukast (SINGULAIR) 10 MG tablet Take 10 mg by mouth at bedtime.    [provider]  Multiple Vitamin (MULTIVITAMIN) tablet Take 1 tablet by mouth daily. With iron.    [provider]  omeprazole (PRILOSEC) 20 MG capsule Take 20 mg by mouth daily.    [provider]  pantoprazole sodium (PROTONIX) 40 mg/20 mL PACK Place 40 mg into feeding tube daily.    [provider]  potassium chloride (K-DUR,KLOR-CON) 10 MEQ tablet Take 10 mEq by mouth every other day.     [provider]  senna (SENOKOT) 8.6 MG TABS tablet Take 8.6 mg by mouth 2 (two) times daily as needed for mild constipation. Reported on 03/06/2016    [provider]  SYMBICORT 160-4.5 MCG/ACT inhaler INHALE 2 PUFFS BY MOUTH INTO THE LUNG 2 TIMES A DAY 03/20/17   Kalman Shan, MD  verapamil (VERELAN PM) 360 MG 24 hr capsule Take 1 capsule (360 mg total) by mouth  daily. 01/11/15   Dhungel, Theda Belfast, MD    Family History Family History  Problem Relation Age of Onset  . Diabetes Mother   . Heart failure Mother   . Throat cancer Brother        throat cancer   . Asthma Other   . COPD Other   . Hypertension Other   . Stroke Other   . Heart disease Other     Social History Social History  Substance Use Topics  . Smoking status: Former Smoker    Packs/day: 0.75    Years: 25.00    Types: Cigarettes    Quit date: 01/12/2011  . Smokeless tobacco: Never Used  . Alcohol use 2.4 oz/week    4 Cans of beer per week  Comment: occ     Allergies   Fish allergy; Peanuts [peanut oil]; Hydrocodone; Iodinated diagnostic agents; Other; Oxycodone; Oxycodone-acetaminophen; Penicillins; Percocet [oxycodone-acetaminophen]; and Sulfa antibiotics   Review of Systems Review of Systems  All other systems reviewed and are negative.    Physical Exam Updated Vital Signs BP (!) 128/58 (BP Location: Left Arm)   Pulse 74   Temp 98.8 F (37.1 C) (Oral)   Resp 18   SpO2 91%   Physical Exam  Constitutional: She is oriented to person, place, and time. She appears well-developed and well-nourished.  Non-toxic appearance. No distress.  HENT:  Head: Normocephalic and atraumatic.  Eyes: Pupils are equal, round, and reactive to light. Conjunctivae, EOM and lids are normal.  Neck: Normal range of motion. Neck supple. No tracheal deviation present. No thyroid mass present.  Cardiovascular: Normal rate, regular rhythm and normal heart sounds.  Exam reveals no gallop.   No murmur heard. Pulmonary/Chest: Effort normal and breath sounds normal. No stridor. No respiratory distress. She has no decreased breath sounds. She has no wheezes. She has no rhonchi. She has no rales.  Abdominal: Soft. Normal appearance and bowel sounds are normal. She exhibits no distension. There is no tenderness. There is no rebound and no CVA tenderness.  Musculoskeletal: Normal range of  motion. She exhibits no edema or tenderness.       Back:       Feet:  Neurological: She is alert and oriented to person, place, and time. She has normal strength. No cranial nerve deficit or sensory deficit. GCS eye subscore is 4. GCS verbal subscore is 5. GCS motor subscore is 6.  Skin: Skin is warm and dry. No abrasion and no rash noted.  Psychiatric: She has a normal mood and affect. Her speech is normal and behavior is normal.  Nursing note and vitals reviewed.    ED Treatments / Results  Labs (all labs ordered are listed, but only abnormal results are displayed) Labs Reviewed  COMPREHENSIVE METABOLIC PANEL - Abnormal; Notable for the following:       Result Value   Potassium 3.3 (*)    Chloride 97 (*)    Glucose, Bld 101 (*)    Creatinine, Ser 1.09 (*)    ALT 13 (*)    GFR calc non Af Amer 51 (*)    GFR calc Af Amer 59 (*)    All other components within normal limits  CBC - Abnormal; Notable for the following:    RBC 3.72 (*)    Hemoglobin 11.1 (*)    All other components within normal limits  LIPASE, BLOOD  URINALYSIS, ROUTINE W REFLEX MICROSCOPIC  POC OCCULT BLOOD, ED    EKG  EKG Interpretation None       Radiology No results found.  Procedures Procedures (including critical care time)  Medications Ordered in ED Medications  sodium chloride 0.9 % bolus 1,000 mL (not administered)  0.9 %  sodium chloride infusion (not administered)  LORazepam (ATIVAN) injection 0.5 mg (not administered)     Initial Impression / Assessment and Plan / ED Course  I have reviewed the triage vital signs and the nursing notes.  Pertinent labs & imaging results that were available during my care of the patient were reviewed by me and considered in my medical decision making (see chart for details).     Patient's x-rays without acute findings. She has no signs of severe anemia. She is chronically on home O2. Urinalysis is negative for infection.  Left great toe suspicious  for gout. Will treat with short course of prednisone and return precautions given.  Final Clinical Impressions(s) / ED Diagnoses   Final diagnoses:  None    New Prescriptions New Prescriptions   No medications on file     Lorre Nick, MD 07/13/17 1442

## 2017-07-17 ENCOUNTER — Ambulatory Visit (INDEPENDENT_AMBULATORY_CARE_PROVIDER_SITE_OTHER): Payer: Medicare HMO | Admitting: Pulmonary Disease

## 2017-07-17 ENCOUNTER — Ambulatory Visit (INDEPENDENT_AMBULATORY_CARE_PROVIDER_SITE_OTHER)
Admission: RE | Admit: 2017-07-17 | Discharge: 2017-07-17 | Disposition: A | Discharge status: Home / Self Care | Payer: Medicare HMO | Source: Ambulatory Visit | Attending: Pulmonary Disease | Admitting: Pulmonary Disease

## 2017-07-17 ENCOUNTER — Encounter: Payer: Self-pay | Admitting: Pulmonary Disease

## 2017-07-17 VITALS — BP 152/82 | HR 74 | Ht 61.0 in | Wt 220.0 lb

## 2017-07-17 DIAGNOSIS — R0600 Dyspnea, unspecified: Secondary | ICD-10-CM

## 2017-07-17 DIAGNOSIS — R0689 Other abnormalities of breathing: Secondary | ICD-10-CM | POA: Diagnosis not present

## 2017-07-17 DIAGNOSIS — J4540 Moderate persistent asthma, uncomplicated: Secondary | ICD-10-CM

## 2017-07-17 MED ORDER — AZITHROMYCIN 250 MG PO TABS: ORAL_TABLET | ORAL | 0 refills | Status: AC

## 2017-07-17 NOTE — Progress Notes (Signed)
Anita Gonzalez    [id number]    [birth year]  Primary Care Physician:Osei-Bonsu, Greggory Stallion, MD  Referring Physician: [name], MD [id number] [place], [place] [address]  Chief complaint:  Acute visit for dyspnea  HPI: [age]-year-old with moderate persistent asthma, follows with Dr. Marchelle Gearing. She has complains of one week of cough with green sputum, dyspnea, wheezing. She denies any fevers, chills. She was seen in the ED last week for foot pain and given a prednisone taper for gout. She is still on the prednisone today but cannot tell if it changed her breathing.   Outpatient Encounter Prescriptions as of 07/17/2017  Medication Sig  . Acetaminophen 500 MG coapsule   . albuterol (PROVENTIL HFA;VENTOLIN HFA) 108 (90 BASE) MCG/ACT inhaler Inhale 2 puffs into the lungs every 6 (six) hours as needed for wheezing or shortness of breath (wheezing).   . bumetanide (BUMEX) 2 MG tablet Take 2 mg by mouth 2 (two) times a week. Monday and Thursday.  . clobetasol (TEMOVATE) 0.05 % GEL Apply 1 application topically 2 (two) times daily. (Patient taking differently: Apply 1 application topically as needed. )  . cyanocobalamin 1000 MCG tablet Take 100 mcg by mouth once a week. Monday  . cyclobenzaprine (FLEXERIL) 10 MG tablet Take 10 mg by mouth daily.   . diclofenac sodium (VOLTAREN) 1 % GEL Apply topically 4 (four) times daily.  . diphenoxylate-atropine (LOMOTIL) 2.5-0.025 MG tablet Take by mouth 4 (four) times daily as needed for diarrhea or loose stools.  . ergocalciferol (VITAMIN D2) 50000 UNITS capsule Take 50,000 Units by mouth once a week. Thursday.  . ezetimibe (ZETIA) 10 MG tablet Take 1 tablet (10 mg total) by mouth daily.  . ferrous sulfate 325 (65 FE) MG tablet Take 325 mg by mouth 2 (two) times daily with a meal.   . fluticasone (FLONASE) 50 MCG/ACT nasal spray Place 1 spray into both nostrils daily as needed for allergies or rhinitis.  . folic acid (FOLVITE) 1 MG  tablet Take 1 mg by mouth daily.  Marland Kitchen ipratropium-albuterol (DUONEB) 0.5-2.5 (3) MG/3ML SOLN Inhale 3 mLs into the lungs every 6 (six) hours.  . irbesartan (AVAPRO) 300 MG tablet Take 1 tablet (300 mg total) by mouth daily.  . meloxicam (MOBIC) 7.5 MG tablet Take 15 mg by mouth daily.   . metFORMIN (GLUCOPHAGE) 500 MG tablet Take 500 mg by mouth 2 (two) times daily.  . Metoprolol Tartrate 75 MG TABS Take 1 tablet by mouth daily.  . montelukast (SINGULAIR) 10 MG tablet Take 10 mg by mouth at bedtime.  . pantoprazole sodium (PROTONIX) 40 mg/20 mL PACK Place 40 mg into feeding tube daily.  . potassium chloride (K-DUR,KLOR-CON) 10 MEQ tablet Take 10 mEq by mouth every other day.   . predniSONE (DELTASONE) 50 MG tablet 1 by mouth daily 4  . senna (SENOKOT) 8.6 MG TABS tablet Take 8.6 mg by mouth 2 (two) times daily as needed for mild constipation. Reported on 03/06/2016  . SYMBICORT 160-4.5 MCG/ACT inhaler INHALE 2 PUFFS BY MOUTH INTO THE LUNG 2 TIMES A DAY  . verapamil (VERELAN PM) 360 MG 24 hr capsule Take 1 capsule (360 mg total) by mouth daily.  . [DISCONTINUED] Multiple Vitamin (MULTIVITAMIN) tablet Take 1 tablet by mouth daily. With iron.  . [DISCONTINUED] omeprazole (PRILOSEC) 20 MG capsule Take 20 mg by mouth daily.  Marland Kitchen atorvastatin (LIPITOR) 40 MG tablet Take 1 tablet (40 mg total) by mouth  daily.   No facility-administered encounter medications on file as of 07/17/2017.     Allergies as of 07/17/2017 - Review Complete 07/17/2017  Allergen Reaction Noted  . Fish allergy Anaphylaxis, Hives, and Swelling 01/06/2015  . Peanuts [peanut oil] Anaphylaxis, Hives, Itching, and Swelling 01/16/2015  . Hydrocodone Nausea Only 04/23/2015  . Iodinated diagnostic agents Hives, Itching, and Swelling 12/21/2014  . Other    . Oxycodone Nausea Only and Other (See Comments) 04/23/2015  . Oxycodone-acetaminophen Other (See Comments) 02/07/2016  . Penicillins Hives, Itching, and Swelling 12/21/2014  .  Percocet [oxycodone-acetaminophen] Nausea Only and Other (See Comments) 12/21/2014  . Sulfa antibiotics  10/03/2015    Past Medical History:  Diagnosis Date  . Anemia   . Arthritis   . Asthma   . Bronchitis   . COPD (chronic obstructive pulmonary disease) (HCC)   . Diabetes mellitus without complication (HCC)   . GERD (gastroesophageal reflux disease)   . Hay fever   . Hypertension   . Rheumatoid arthritis (HCC) 1996    Past Surgical History:  Procedure Laterality Date  . ABDOMINAL HYSTERECTOMY    . CESAREAN SECTION    . CESAREAN SECTION    . COLONOSCOPY WITH PROPOFOL N/A 04/02/2015   Procedure: COLONOSCOPY WITH PROPOFOL;  Surgeon: Jeani Hawking, MD;  Location: [facility];  Service: Endoscopy;  Laterality: N/A;  . ESOPHAGOGASTRODUODENOSCOPY (EGD) WITH PROPOFOL N/A 04/02/2015   Procedure: ESOPHAGOGASTRODUODENOSCOPY (EGD) WITH PROPOFOL;  Surgeon: Jeani Hawking, MD;  Location: [facility];  Service: Endoscopy;  Laterality: N/A;  . HEMORRHOID SURGERY    . HERNIA REPAIR    . HERNIA REPAIR      Family History  Problem Relation Age of Onset  . Diabetes Mother   . Heart failure Mother   . Throat cancer Brother        throat cancer   . Asthma Other   . COPD Other   . Hypertension Other   . Stroke Other   . Heart disease Other     Social History   Social History  . Marital status: Single    Spouse name: N/A  . Number of children: N/A  . Years of education: N/A   Occupational History  . retired    Social History Main Topics  . Smoking status: Former Smoker    Packs/day: 0.75    Years: 25.00    Types: Cigarettes    Quit date: 01/12/2011  . Smokeless tobacco: Never Used  . Alcohol use 2.4 oz/week    4 Cans of beer per week     Comment: occ  . Drug use: No  . Sexual activity: Not on file   Other Topics Concern  . Not on file   Social History Narrative  . No narrative on file    Review of systems: Review of Systems  Constitutional: Negative for fever and  chills.  HENT: Negative.   Eyes: Negative for blurred vision.  Respiratory: as per HPI  Cardiovascular: Negative for chest pain and palpitations.  Gastrointestinal: Negative for vomiting, diarrhea, blood per rectum. Genitourinary: Negative for dysuria, urgency, frequency and hematuria.  Musculoskeletal: Negative for myalgias, back pain and joint pain.  Skin: Negative for itching and rash.  Neurological: Negative for dizziness, tremors, focal weakness, seizures and loss of consciousness.  Endo/Heme/Allergies: Negative for environmental allergies.  Psychiatric/Behavioral: Negative for depression, suicidal ideas and hallucinations.  All other systems reviewed and are negative.  Physical Exam: Blood pressure (!) 152/82, pulse 74, height 5\' 1"  (1.549 m),  weight 220 lb (99.8 kg), SpO2 94 %. Gen:      No acute distress HEENT:  EOMI, sclera anicteric Neck:     No masses; no thyromegaly Lungs:    Clear to auscultation bilaterally; normal respiratory effort CV:         Regular rate and rhythm; no murmurs Abd:      + bowel sounds; soft, non-tender; no palpable masses, no distension Ext:    No edema; adequate peripheral perfusion Skin:      Warm and dry; no rash Neuro: alert and oriented x 3 Psych: normal mood and affect  Data Reviewed: PFTs 05/30/16 FVC 1.16 (55%) FEV1 1.03 (62%) F/F 89 TLC 61% DLCO 68% Severe restriction, increased diffusion defect.  Assessment:  Moderate persistent asthma with exacerbation Acute bronchitis She has an exacerbation associated with bronchitis. She is already on prednisone and is not wheezing in office today but has sputum with green mucus. We will add a course of azithromycin. She'll get xray for evaluation of any infiltrate.  Plan/Recommendations: - X ray - Z pack - Continue prednisone  Chilton Greathouse MD  Pulmonary and Critical Care Pager [phone] 07/17/2017, 3:50 PM  CC: [name], MD

## 2017-07-17 NOTE — Patient Instructions (Signed)
We'll get a chest x-ray  We will give a Z-Pak for treatment of  bronchitis Continue prednisone until it is finished Continue inhalers Follow-up With Dr. Marchelle Gearing

## 2017-07-19 ENCOUNTER — Other Ambulatory Visit: Payer: Medicare HMO

## 2017-07-19 ENCOUNTER — Ambulatory Visit: Payer: Medicare HMO | Admitting: Hematology

## 2017-07-20 NOTE — Progress Notes (Signed)
[facility]  [phone] Fax:([phone]) [phone]  Clinic Follow up Note   Patient Care Team: [name], MD as PCP - General (Internal Medicine) Jearld Lesch, MD as Referring Physician (Specialist) Jearld Lesch, MD as Referring Physician (Specialist) Jeani Hawking, MD as Consulting Physician (Gastroenterology) Clyde Lundborg., MD as Referring Physician (Anesthesiology) 07/24/2017  CHIEF COMPLAINT:  Follow-up iron deficient anemia  HISTORY OF PRESENTING ILLNESS (01/2015):  Anita Gonzalez 71 y.o. female with history of DM 2, HTN, HLD, asthma, rheumatoid arthritis, former smoker, recently moved to this area from [place], is here because of anemia.   She presented to the ED on 01/06/2015 with fever, chills, productive cough, dyspnea, wheezing, chest pain and poor appetite of ~ 1 week duration. In the ED, febrile to 102.61F, tachycardic, WBC 12.5 and chest x-ray suggestive of right lower lobe pneumonia. Treating for CAP, then developed acute kidney injury-improving and narrow complex tachycardia 2/27. Her clinical condition improved and she was discharged home with oxygen on 01/11/2015. During her hospital stay, she was found to have moderate anemia with hemoglobin 8-9, low serum iron and ferritin level. She was referred to our clinic for further evaluation.  She was found to have abnormal CBC from 2011, and has been on iron pill two pill daily, and B12 injection and pill for the past two years. She had EGD, capsule endoscopy and colonoscopy in [place] a few years ago by Dr. Francella Solian, which was normal per pt.   She denies recent chest pain on exertion, (+) shortness of breath on exertion at night, no pre-syncopal episodes, or palpitations. She had not noticed any recent bleeding such as epistaxis, hematuria or hematochezia The patient denies over the counter NSAID ingestion. She is not on antiplatelets agents. She had no prior history or diagnosis  of cancer. Her age appropriate screening programs are up-to-date. She denies any pica and eats a variety of diet. She never donated blood or received blood transfusion  She is still on oxygen. She has some dyspnea at night. No orthopnea. She is able to do all her ADLs and light housework. She was not very physically active even before the hospitalization last month.  CURRENT THERAPY: 1.  IV Feraheme as need, received in 01/2015, 03/2015 and 09/2015, 11/29/2015, 12/06/2015, 12/22/2016, 01/15/2017 2. Ferrous sulfate 2 tablets a day  INTERIM HISTORY:  Anita Gonzalez returns for follow-up. She has significant fatigue. She is having trouble with her knees and recently fell and hurt her foot and hip, went to ED. Imaging of foot, hip, lumbar spine revealed no fractures. She walks with a cane but is in a wheelchair today. She was not very active prior to her fall. Denies bleeding. She is experiencing constant diarrhea since her fall. 3-5 episodes per day. Dr. Raliegh Ip previously prescribed lomotil. Stool is dark, no blood, patient is on oral iron. CBC is stable, Hgb 11.9 today, Hgb 11.1 in ED on 07/13/17. She denies chest pain.    MEDICAL HISTORY:  Past Medical History:  Diagnosis Date  . Anemia   . Arthritis   . Asthma   . Bronchitis   . COPD (chronic obstructive pulmonary disease) (HCC)   . Diabetes mellitus without complication (HCC)   . GERD (gastroesophageal reflux disease)   . Hay fever   . Hypertension   . Rheumatoid arthritis (HCC) 1996   SURGICAL HISTORY: Past Surgical History:  Procedure Laterality Date  . ABDOMINAL HYSTERECTOMY    . CESAREAN SECTION    .  CESAREAN SECTION    . COLONOSCOPY WITH PROPOFOL N/A 04/02/2015   Procedure: COLONOSCOPY WITH PROPOFOL;  Surgeon: Jeani Hawking, MD;  Location: [facility];  Service: Endoscopy;  Laterality: N/A;  . ESOPHAGOGASTRODUODENOSCOPY (EGD) WITH PROPOFOL N/A 04/02/2015   Procedure: ESOPHAGOGASTRODUODENOSCOPY (EGD) WITH PROPOFOL;  Surgeon: Jeani Hawking,  MD;  Location: [facility];  Service: Endoscopy;  Laterality: N/A;  . HEMORRHOID SURGERY    . HERNIA REPAIR    . HERNIA REPAIR     SOCIAL HISTORY: Social History   Social History  . Marital status: Single    Spouse name: N/A  . Number of children: N/A  . Years of education: N/A   Occupational History  . retired    Social History Main Topics  . Smoking status: Former Smoker    Packs/day: 0.75    Years: 25.00    Types: Cigarettes    Quit date: 01/12/2011  . Smokeless tobacco: Never Used  . Alcohol use 2.4 oz/week    4 Cans of beer per week     Comment: occ  . Drug use: No  . Sexual activity: Not on file   Other Topics Concern  . Not on file   Social History Narrative  . No narrative on file   FAMILY HISTORY: Family History  Problem Relation Age of Onset  . Diabetes Mother   . Heart failure Mother   . Throat cancer Brother        throat cancer   . Asthma Other   . COPD Other   . Hypertension Other   . Stroke Other   . Heart disease Other    ALLERGIES:  is allergic to fish allergy; peanuts [peanut oil]; hydrocodone; iodinated diagnostic agents; other; oxycodone; oxycodone-acetaminophen; penicillins; percocet [oxycodone-acetaminophen]; and sulfa antibiotics.  MEDICATIONS:  Current Outpatient Prescriptions  Medication Sig Dispense Refill  . Acetaminophen 500 MG coapsule     . acetaminophen-codeine (TYLENOL #3) 300-30 MG tablet Take 1 tablet by mouth three times a day as needed for pain    . albuterol (PROVENTIL HFA;VENTOLIN HFA) 108 (90 BASE) MCG/ACT inhaler Inhale 2 puffs into the lungs every 6 (six) hours as needed for wheezing or shortness of breath (wheezing).     . bumetanide (BUMEX) 2 MG tablet Take 2 mg by mouth 2 (two) times a week. Monday and Thursday.    . clobetasol (TEMOVATE) 0.05 % GEL Apply 1 application topically 2 (two) times daily. (Patient taking differently: Apply 1 application topically as needed. ) 30 each 0  . cyanocobalamin 1000 MCG tablet  Take 100 mcg by mouth once a week. Monday    . cyclobenzaprine (FLEXERIL) 10 MG tablet Take 10 mg by mouth daily.     . diclofenac sodium (VOLTAREN) 1 % GEL Apply topically 4 (four) times daily.    . diphenoxylate-atropine (LOMOTIL) 2.5-0.025 MG tablet Take by mouth 4 (four) times daily as needed for diarrhea or loose stools.    . ergocalciferol (VITAMIN D2) 50000 UNITS capsule Take 50,000 Units by mouth once a week. Thursday.    . ezetimibe (ZETIA) 10 MG tablet Take 1 tablet (10 mg total) by mouth daily. 90 tablet 3  . ferrous sulfate 325 (65 FE) MG tablet Take 325 mg by mouth 2 (two) times daily with a meal.     . fluticasone (FLONASE) 50 MCG/ACT nasal spray Place 1 spray into both nostrils daily as needed for allergies or rhinitis.    . folic acid (FOLVITE) 1 MG tablet Take 1 mg by  mouth daily.    . irbesartan (AVAPRO) 300 MG tablet Take 1 tablet (300 mg total) by mouth daily. 90 tablet 3  . metFORMIN (GLUCOPHAGE) 500 MG tablet Take 500 mg by mouth 2 (two) times daily.    . Metoprolol Tartrate 75 MG TABS Take 1 tablet by mouth daily.    . montelukast (SINGULAIR) 10 MG tablet Take 10 mg by mouth at bedtime.    . potassium chloride (K-DUR) 10 MEQ tablet TAKE 1 TABLET BY MOUTH EVERY OTHER DAY    . potassium chloride (K-DUR,KLOR-CON) 10 MEQ tablet Take 10 mEq by mouth every other day.     . predniSONE (DELTASONE) 50 MG tablet 1 by mouth daily 4 4 tablet 0  . senna (SENOKOT) 8.6 MG TABS tablet Take 8.6 mg by mouth 2 (two) times daily as needed for mild constipation. Reported on 03/06/2016    . SYMBICORT 160-4.5 MCG/ACT inhaler INHALE 2 PUFFS BY MOUTH INTO THE LUNG 2 TIMES A DAY 30.6 g 3  . verapamil (VERELAN PM) 360 MG 24 hr capsule Take 1 capsule (360 mg total) by mouth daily. 30 capsule 0  . atorvastatin (LIPITOR) 40 MG tablet Take 1 tablet (40 mg total) by mouth daily. 30 tablet 11  . ipratropium-albuterol (DUONEB) 0.5-2.5 (3) MG/3ML SOLN Inhale 3 mLs into the lungs every 6 (six) hours.    .  meloxicam (MOBIC) 7.5 MG tablet Take 15 mg by mouth daily.     . pantoprazole (PROTONIX) 40 MG tablet     . pantoprazole sodium (PROTONIX) 40 mg/20 mL PACK Place 40 mg into feeding tube daily.     No current facility-administered medications for this visit.    REVIEW OF SYSTEMS:  Constitutional: Denies fevers, chills or abnormal night sweats, (+) fatigue Eyes: Denies blurriness of vision, double vision or watery eyes Ears, nose, mouth, throat, and face: Denies mucositis or sore throat Respiratory: Denies cough, dyspnea, no wheezes Cardiovascular: Denies palpitation, chest discomfort or lower extremity swelling Gastrointestinal:  Denies nausea, heartburn or change in bowel habits Skin: Denies abnormal skin rashes Lymphatics: Denies new lymphadenopathy or easy bruising Neurological:Denies numbness, tingling or new weaknesses Behavioral/Psych: Mood is stable, no new changes  All other systems were reviewed with the patient and are negative.  PHYSICAL EXAMINATION: ECOG PERFORMANCE STATUS: 3  Vitals:   07/24/17 1406  BP: 137/65  Pulse: (!) 45  Resp: 20  Temp: 98.1 F (36.7 C)  SpO2: 93%   Filed Weights   07/24/17 1406  Weight: 230 lb (104.3 kg)   GENERAL:alert, no distress and comfortable, in a wheelchair  SKIN: skin color, texture, turgor are normal, no rashes or significant lesions EYES: normal, conjunctiva are pink and non-injected, sclera clear OROPHARYNX:no exudate, no erythema and lips, buccal mucosa, and tongue normal  NECK: supple, thyroid normal size, non-tender, without nodularity LYMPH:  no palpable lymphadenopathy in the cervical, axillary or inguinal LUNGS: clear to auscultation and percussion with normal breathing effort HEART: regular rate & rhythm and no murmurs and no lower extremity edema ABDOMEN:abdomen soft, non-tender and normal bowel sounds Musculoskeletal:no cyanosis of digits and no clubbing (+) Presents in a wheelchair and is ambulatory with a  cane. PSYCH: alert & oriented x 3 with fluent speech NEURO: no focal motor/sensory deficits  LABORATORY DATA:  I have reviewed the data as listed CBC Latest Ref Rng & Units 07/24/2017 07/13/2017 04/27/2017  WBC 3.9 - 10.3 10e3/uL 5.5 5.0 6.4  Hemoglobin 11.6 - 15.9 g/dL 95.2 11.1(L) 13.0  Hematocrit 34.8 -  46.6 % 37.1 36.6 39.8  Platelets 145 - 400 10e3/uL 320 248 231   CMP Latest Ref Rng & Units 07/13/2017 02/23/2017 11/03/2015  Glucose 65 - 99 mg/dL 604(V) 97 409(W)  BUN 6 - 20 mg/dL Creatinine 0.44 - 1.00 mg/dL 1.19(J) 4.78(G) 9.56(O)  Sodium 135 - 145 mmol/L 139 144 143  Potassium 3.5 - 5.1 mmol/L 3.3(L) 4.5 4.7  Chloride 101 - 111 mmol/L 97(L) 98 102  CO2 22 - 32 mmol/L 30 32(H) 32  Calcium 8.9 - 10.3 mg/dL 9.3 9.8 9.7  Total Protein 6.5 - 8.1 g/dL 6.6 - -  Total Bilirubin 0.3 - 1.2 mg/dL 0.5 - -  Alkaline Phos 38 - 126 U/L 85 - -  AST 15 - 41 U/L 15 - -  ALT 14 - 54 U/L 13(L) - -   Results for Anita, Gonzalez (MRN [id number]) as of 07/24/2017 15:59  Ref. Range 12/22/2016 11:13 02/21/2017 11:01 04/27/2017 10:21 07/24/2017 13:34  Iron Latest Ref Range: 41 - 142 ug/dL 43 71 53 53  UIBC Latest Ref Range: 120 - 384 ug/dL 696 295 284 132  TIBC Latest Ref Range: 236 - 444 ug/dL 440 102 725 366  %SAT Latest Ref Range: 21 - 57 % 13 (L) 25 18 (L) 20 (L)  Ferritin Latest Ref Range: 9 - 269 ng/ml 58 194 141 94   RADIOGRAPHIC STUDIES: I have personally reviewed the radiological images as listed and agreed with the findings in the report. Dg Chest 2 View  Result Date: 07/17/2017 CLINICAL DATA:  Shortness of breath with productive cough EXAM: CHEST  2 VIEW COMPARISON:  07/13/2017 FINDINGS: Low lung volumes. Diffuse coarse interstitial opacity, likely chronic bronchitic change, similar compared to prior. Stable cardiomediastinal silhouette with atherosclerosis. No pneumothorax. Scoliosis and degenerative changes of the spine. IMPRESSION: Low lung volumes with mild diffuse chronic bronchitic  changes. No acute infiltrates are seen. Electronically Signed   By: Jasmine Pang M.D.   On: 07/17/2017 19:20   Dg Ribs Unilateral W/chest Left  Result Date: 07/13/2017 CLINICAL DATA:  Status post fall. EXAM: LEFT RIBS AND CHEST - 3+ VIEW COMPARISON:  None. FINDINGS: No fracture or other bone lesions are seen involving the ribs. There is no evidence of pneumothorax or pleural effusion. Both lungs are clear. Heart size and mediastinal contours are within normal limits. IMPRESSION: Negative. Electronically Signed   By: Elige Ko   On: 07/13/2017 13:15   Dg Lumbar Spine Complete  Result Date: 07/13/2017 CLINICAL DATA:  Fall. EXAM: LUMBAR SPINE - COMPLETE 4+ VIEW COMPARISON:  None. FINDINGS: No acute fracture. Grade 1 stepwise anterolisthesis of L3 on L4, L4 on L5, and L5 on S1, likely due to severe facet arthropathy. Mild disc height loss at L3-L4 and L4-L5. Moderate to severe disc height loss at L5-S1. Vertebral body heights are preserved. The bones are osteopenic. Atherosclerotic vascular calcifications of the abdominal aorta. IMPRESSION: 1. No definite lumbar spine fracture. 2. Stepwise grade 1 anterolisthesis at L3-L4, L4-L5, and L5-S1 due to severe facet arthropathy. Electronically Signed   By: Obie Dredge M.D.   On: 07/13/2017 13:18   Dg Foot Complete Left  Result Date: 07/13/2017 CLINICAL DATA:  Fall. EXAM: LEFT FOOT - COMPLETE 3+ VIEW COMPARISON:  None. FINDINGS: No acute fracture or malalignment. Mild degenerative changes of the first MTP joint and dorsal midfoot. Os navicular. Small plantar and Achilles enthesophytes. Osteopenia. Mild forefoot soft tissue swelling. IMPRESSION: Mild forefoot soft tissue swelling.  No acute osseous  abnormality. Electronically Signed   By: Obie Dredge M.D.   On: 07/13/2017 13:12   Dg Hip Unilat W Or Wo Pelvis 2-3 Views Left  Result Date: 07/13/2017 CLINICAL DATA:  Lower back and left hip pain after fall. EXAM: DG HIP (WITH OR WITHOUT PELVIS) 2-3V LEFT  COMPARISON:  None. FINDINGS: There is no evidence of hip fracture or dislocation. Mild degenerative changes of the bilateral hip and sacroiliac joints. Severe degenerative changes of the lower lumbar spine. Osteopenia. IMPRESSION: 1. No acute fracture or malalignment. If occult hip fracture is suspected or if the patient is unable to bear weight, MRI is the preferred modality for further evaluation. 2. Mild bilateral hip joint degenerative changes. Electronically Signed   By: Obie Dredge M.D.   On: 07/13/2017 13:13   ASSESSMENT & PLAN: 71 y.o. African-American female with past medical history of diabetes, hypertension, rheumatoid arthritis, who has chronic anemia for a few years.  1. Normocytic anemia secondary to iron deficiency, from GI AVM bleeding  -She has moderate anemia with hemoglobin in 8-9 range, MCV normal, ferritin 16, serum iron 13, URBC elevated at 430, iron saturation 3%, this is consistent with iron deficient anemia -Anemia has been a few years, likely secondary to slow GI bleeding. Her stool OB was positive. Colonoscopy showed AVM, last done in 03/2015. -Her SPEP and UPEP were negative.  -She also may have a component of anemia of chronic disease secondary to kidney disease and rheumatoid arthritis -Her hemoglobin has responded to IV feraheme in the past, she received IV Feraheme 4 times in 2016 and twice in 2018 (january and march) -his iron study showed ferritin 94, normal serum iron, slightly low transferrin saturation, giving her mild anemia, I'll set up IV Feraheme once in the next few weeks. -Call clinic for increased fatigue, diarrhea, blood in stool.  -stop oral iron for 3 days, if stool is remains very dark, this may require further work up with Dr. Elnoria Howard to rule out GI bleeding    2. Hypertension, diabetes, rheumatoid arthritis, asthma -She will continue follow-up with her primary care physician and other specialists  Plan -stop oral iron for 3 days and monitor stool.    -if stool remains dark black, call our clinic or make an apt with Dr. Elnoria Howard further work up.  -IV feraheme once in 1-2 weeks   -labs in 2 and 4 months -lab and f/u in 6 months  All questions were answered. The patient knows to call the clinic with any problems, questions or concerns. I spent 15 minutes counseling the patient face to face. The total time spent in the appointment was 20 minutes and more than 50% was on counseling.     Malachy Mood, MD 07/24/2017   This document serves as a record of services personally performed by Malachy Mood, MD. It was created on her behalf by Delphina Cahill, a trained medical scribe. The creation of this record is based on the scribe's personal observations and the provider's statements to them. This document has been checked and approved by the attending provider.

## 2017-07-23 ENCOUNTER — Other Ambulatory Visit: Payer: Self-pay | Admitting: Internal Medicine

## 2017-07-23 DIAGNOSIS — Z1231 Encounter for screening mammogram for malignant neoplasm of breast: Secondary | ICD-10-CM

## 2017-07-24 ENCOUNTER — Ambulatory Visit (HOSPITAL_BASED_OUTPATIENT_CLINIC_OR_DEPARTMENT_OTHER): Payer: Medicare HMO | Admitting: Hematology

## 2017-07-24 ENCOUNTER — Other Ambulatory Visit (HOSPITAL_BASED_OUTPATIENT_CLINIC_OR_DEPARTMENT_OTHER): Payer: Medicare HMO

## 2017-07-24 ENCOUNTER — Telehealth: Payer: Self-pay | Admitting: Hematology

## 2017-07-24 ENCOUNTER — Encounter: Payer: Self-pay | Admitting: Hematology

## 2017-07-24 VITALS — BP 137/65 | HR 45 | Temp 98.1°F | Resp 20 | Wt 230.0 lb

## 2017-07-24 DIAGNOSIS — I10 Essential (primary) hypertension: Secondary | ICD-10-CM | POA: Diagnosis not present

## 2017-07-24 DIAGNOSIS — D500 Iron deficiency anemia secondary to blood loss (chronic): Secondary | ICD-10-CM

## 2017-07-24 DIAGNOSIS — E119 Type 2 diabetes mellitus without complications: Secondary | ICD-10-CM

## 2017-07-24 DIAGNOSIS — D509 Iron deficiency anemia, unspecified: Secondary | ICD-10-CM

## 2017-07-24 DIAGNOSIS — Q2733 Arteriovenous malformation of digestive system vessel: Secondary | ICD-10-CM

## 2017-07-24 LAB — IRON AND TIBC
%SAT: 20 % — ABNORMAL LOW (ref 21–57)
IRON: 53 ug/dL (ref 41–142)
TIBC: 266 ug/dL (ref 236–444)
UIBC: 213 ug/dL (ref 120–384)

## 2017-07-24 LAB — CBC WITH DIFFERENTIAL/PLATELET
BASO%: 0.7 % (ref 0.0–2.0)
Basophils Absolute: 0 10*3/uL (ref 0.0–0.1)
EOS ABS: 0.2 10*3/uL (ref 0.0–0.5)
EOS%: 3.2 % (ref 0.0–7.0)
HCT: 37.1 % (ref 34.8–46.6)
HEMOGLOBIN: 11.9 g/dL (ref 11.6–15.9)
LYMPH%: 32 % (ref 14.0–49.7)
MCH: 31 pg (ref 25.1–34.0)
MCHC: 32.1 g/dL (ref 31.5–36.0)
MCV: 96.7 fL (ref 79.5–101.0)
MONO#: 0.4 10*3/uL (ref 0.1–0.9)
MONO%: 8 % (ref 0.0–14.0)
NEUT%: 56.1 % (ref 38.4–76.8)
NEUTROS ABS: 3.1 10*3/uL (ref 1.5–6.5)
Platelets: 320 10*3/uL (ref 145–400)
RBC: 3.84 10*6/uL (ref 3.70–5.45)
RDW: 14.6 % — AB (ref 11.2–14.5)
WBC: 5.5 10*3/uL (ref 3.9–10.3)
lymph#: 1.8 10*3/uL (ref 0.9–3.3)

## 2017-07-24 LAB — FERRITIN: FERRITIN: 94 ng/mL (ref 9–269)

## 2017-07-24 NOTE — Telephone Encounter (Signed)
Gave avs and calendar for November - march

## 2017-07-25 ENCOUNTER — Telehealth: Payer: Self-pay | Admitting: Hematology

## 2017-07-25 NOTE — Telephone Encounter (Signed)
Spoke with patient regarding the addition of her appts next week.

## 2017-07-30 ENCOUNTER — Ambulatory Visit: Payer: Medicare HMO

## 2017-08-02 ENCOUNTER — Ambulatory Visit (HOSPITAL_BASED_OUTPATIENT_CLINIC_OR_DEPARTMENT_OTHER): Payer: Medicare HMO

## 2017-08-02 VITALS — BP 124/45 | HR 61 | Temp 98.6°F | Resp 20

## 2017-08-02 DIAGNOSIS — D500 Iron deficiency anemia secondary to blood loss (chronic): Secondary | ICD-10-CM

## 2017-08-02 DIAGNOSIS — Q2733 Arteriovenous malformation of digestive system vessel: Secondary | ICD-10-CM

## 2017-08-02 MED ORDER — SODIUM CHLORIDE 0.9 % IV SOLN
510.0000 mg | Freq: Once | INTRAVENOUS | Status: AC
  Administered 2017-08-02: 510 mg via INTRAVENOUS
  Filled 2017-08-02: qty 17

## 2017-08-02 NOTE — Patient Instructions (Signed)

## 2017-08-03 ENCOUNTER — Other Ambulatory Visit: Payer: Medicare HMO

## 2017-08-03 ENCOUNTER — Ambulatory Visit: Payer: Medicare HMO | Admitting: Internal Medicine

## 2017-08-06 ENCOUNTER — Ambulatory Visit: Payer: Medicare HMO

## 2017-08-15 ENCOUNTER — Ambulatory Visit
Admission: RE | Admit: 2017-08-15 | Discharge: 2017-08-15 | Disposition: A | Discharge status: Home / Self Care | Payer: Medicare HMO | Source: Ambulatory Visit | Attending: Internal Medicine | Admitting: Internal Medicine

## 2017-08-15 DIAGNOSIS — Z1231 Encounter for screening mammogram for malignant neoplasm of breast: Secondary | ICD-10-CM

## 2017-09-20 DIAGNOSIS — M79672 Pain in left foot: Secondary | ICD-10-CM | POA: Diagnosis not present

## 2017-09-20 DIAGNOSIS — R609 Edema, unspecified: Secondary | ICD-10-CM | POA: Diagnosis not present

## 2017-09-20 DIAGNOSIS — M84375D Stress fracture, left foot, subsequent encounter for fracture with routine healing: Secondary | ICD-10-CM | POA: Diagnosis not present

## 2017-09-20 DIAGNOSIS — E1351 Other specified diabetes mellitus with diabetic peripheral angiopathy without gangrene: Secondary | ICD-10-CM | POA: Diagnosis not present

## 2017-09-24 ENCOUNTER — Telehealth: Payer: Self-pay | Admitting: *Deleted

## 2017-09-24 NOTE — Telephone Encounter (Signed)
From patient regarding appointment on 09/26/17.  Returned call to patient. Advised pt that she had a lab appt on 09/26/17. Pt states she cannot come that day to lab appt.  Due to transportation concerns.  Scheduling message sent to cancel appt on 09/26/17 and to have re-scheduled. Pt also asking about Vit D 50,000 units refill. Advised pt that this will come from her PCP. Pt voiced understanding.  No there questions or concerns.

## 2017-09-26 ENCOUNTER — Other Ambulatory Visit: Payer: Medicare HMO

## 2017-09-28 ENCOUNTER — Telehealth: Payer: Self-pay

## 2017-09-28 ENCOUNTER — Telehealth: Payer: Self-pay | Admitting: Hematology

## 2017-09-28 NOTE — Telephone Encounter (Signed)
Pt called to r/s her lab from 11/20 to 11/23, d/t transportation needing a 3 day lead time.   Noted that on 11/12 she moved it for the same reason.   placed scheduling inbasket. lvm with pt to be careful not to keep moving lab and get into difficulties.

## 2017-09-28 NOTE — Telephone Encounter (Signed)
Spoke with patient and scheduled them per 11/12 sch msg.

## 2017-10-02 ENCOUNTER — Other Ambulatory Visit: Payer: Medicare HMO

## 2017-10-03 DIAGNOSIS — E785 Hyperlipidemia, unspecified: Secondary | ICD-10-CM | POA: Diagnosis not present

## 2017-10-03 DIAGNOSIS — J45909 Unspecified asthma, uncomplicated: Secondary | ICD-10-CM | POA: Diagnosis not present

## 2017-10-03 DIAGNOSIS — I10 Essential (primary) hypertension: Secondary | ICD-10-CM | POA: Diagnosis not present

## 2017-10-03 DIAGNOSIS — M069 Rheumatoid arthritis, unspecified: Secondary | ICD-10-CM | POA: Diagnosis not present

## 2017-10-03 DIAGNOSIS — R202 Paresthesia of skin: Secondary | ICD-10-CM | POA: Diagnosis not present

## 2017-10-03 DIAGNOSIS — G8929 Other chronic pain: Secondary | ICD-10-CM | POA: Diagnosis not present

## 2017-10-03 DIAGNOSIS — E119 Type 2 diabetes mellitus without complications: Secondary | ICD-10-CM | POA: Diagnosis not present

## 2017-10-03 DIAGNOSIS — B1920 Unspecified viral hepatitis C without hepatic coma: Secondary | ICD-10-CM | POA: Diagnosis not present

## 2017-10-03 DIAGNOSIS — R1013 Epigastric pain: Secondary | ICD-10-CM | POA: Diagnosis not present

## 2017-10-05 ENCOUNTER — Other Ambulatory Visit: Payer: Medicare HMO

## 2017-10-05 DIAGNOSIS — R0902 Hypoxemia: Secondary | ICD-10-CM | POA: Diagnosis not present

## 2017-10-05 DIAGNOSIS — Z8739 Personal history of other diseases of the musculoskeletal system and connective tissue: Secondary | ICD-10-CM | POA: Diagnosis not present

## 2017-10-05 DIAGNOSIS — J449 Chronic obstructive pulmonary disease, unspecified: Secondary | ICD-10-CM | POA: Diagnosis not present

## 2017-10-05 DIAGNOSIS — Z8709 Personal history of other diseases of the respiratory system: Secondary | ICD-10-CM | POA: Diagnosis not present

## 2017-10-11 DIAGNOSIS — D509 Iron deficiency anemia, unspecified: Secondary | ICD-10-CM | POA: Diagnosis not present

## 2017-10-11 DIAGNOSIS — J189 Pneumonia, unspecified organism: Secondary | ICD-10-CM | POA: Diagnosis not present

## 2017-10-11 DIAGNOSIS — J45909 Unspecified asthma, uncomplicated: Secondary | ICD-10-CM | POA: Diagnosis not present

## 2017-10-11 DIAGNOSIS — J9600 Acute respiratory failure, unspecified whether with hypoxia or hypercapnia: Secondary | ICD-10-CM | POA: Diagnosis not present

## 2017-10-19 ENCOUNTER — Other Ambulatory Visit (HOSPITAL_COMMUNITY): Payer: Self-pay | Admitting: Internal Medicine

## 2017-10-19 DIAGNOSIS — R1319 Other dysphagia: Secondary | ICD-10-CM

## 2017-10-24 ENCOUNTER — Ambulatory Visit (HOSPITAL_COMMUNITY): Payer: Medicare HMO

## 2017-10-26 DIAGNOSIS — E119 Type 2 diabetes mellitus without complications: Secondary | ICD-10-CM | POA: Diagnosis not present

## 2017-10-27 DIAGNOSIS — E119 Type 2 diabetes mellitus without complications: Secondary | ICD-10-CM | POA: Diagnosis not present

## 2017-10-29 DIAGNOSIS — E119 Type 2 diabetes mellitus without complications: Secondary | ICD-10-CM | POA: Diagnosis not present

## 2017-10-30 DIAGNOSIS — Z6841 Body Mass Index (BMI) 40.0 and over, adult: Secondary | ICD-10-CM | POA: Diagnosis not present

## 2017-10-30 DIAGNOSIS — B182 Chronic viral hepatitis C: Secondary | ICD-10-CM | POA: Diagnosis not present

## 2017-10-30 DIAGNOSIS — K740 Hepatic fibrosis: Secondary | ICD-10-CM | POA: Diagnosis not present

## 2017-10-30 DIAGNOSIS — E119 Type 2 diabetes mellitus without complications: Secondary | ICD-10-CM | POA: Diagnosis not present

## 2017-10-31 ENCOUNTER — Other Ambulatory Visit: Payer: Self-pay | Admitting: Nurse Practitioner

## 2017-10-31 DIAGNOSIS — E785 Hyperlipidemia, unspecified: Secondary | ICD-10-CM | POA: Diagnosis not present

## 2017-10-31 DIAGNOSIS — B1920 Unspecified viral hepatitis C without hepatic coma: Secondary | ICD-10-CM | POA: Diagnosis not present

## 2017-10-31 DIAGNOSIS — I10 Essential (primary) hypertension: Secondary | ICD-10-CM | POA: Diagnosis not present

## 2017-10-31 DIAGNOSIS — R1013 Epigastric pain: Secondary | ICD-10-CM | POA: Diagnosis not present

## 2017-10-31 DIAGNOSIS — E119 Type 2 diabetes mellitus without complications: Secondary | ICD-10-CM | POA: Diagnosis not present

## 2017-10-31 DIAGNOSIS — R202 Paresthesia of skin: Secondary | ICD-10-CM | POA: Diagnosis not present

## 2017-10-31 DIAGNOSIS — K7469 Other cirrhosis of liver: Secondary | ICD-10-CM

## 2017-10-31 DIAGNOSIS — G8929 Other chronic pain: Secondary | ICD-10-CM | POA: Diagnosis not present

## 2017-10-31 DIAGNOSIS — J45909 Unspecified asthma, uncomplicated: Secondary | ICD-10-CM | POA: Diagnosis not present

## 2017-10-31 DIAGNOSIS — M069 Rheumatoid arthritis, unspecified: Secondary | ICD-10-CM | POA: Diagnosis not present

## 2017-11-01 DIAGNOSIS — E119 Type 2 diabetes mellitus without complications: Secondary | ICD-10-CM | POA: Diagnosis not present

## 2017-11-02 DIAGNOSIS — E119 Type 2 diabetes mellitus without complications: Secondary | ICD-10-CM | POA: Diagnosis not present

## 2017-11-03 DIAGNOSIS — E119 Type 2 diabetes mellitus without complications: Secondary | ICD-10-CM | POA: Diagnosis not present

## 2017-11-04 DIAGNOSIS — Z8709 Personal history of other diseases of the respiratory system: Secondary | ICD-10-CM | POA: Diagnosis not present

## 2017-11-04 DIAGNOSIS — R0902 Hypoxemia: Secondary | ICD-10-CM | POA: Diagnosis not present

## 2017-11-04 DIAGNOSIS — J449 Chronic obstructive pulmonary disease, unspecified: Secondary | ICD-10-CM | POA: Diagnosis not present

## 2017-11-04 DIAGNOSIS — Z8739 Personal history of other diseases of the musculoskeletal system and connective tissue: Secondary | ICD-10-CM | POA: Diagnosis not present

## 2017-11-05 ENCOUNTER — Other Ambulatory Visit: Payer: Self-pay

## 2017-11-05 DIAGNOSIS — E119 Type 2 diabetes mellitus without complications: Secondary | ICD-10-CM | POA: Diagnosis not present

## 2017-11-05 MED ORDER — ATORVASTATIN CALCIUM 40 MG PO TABS
40.0000 mg | ORAL_TABLET | Freq: Every day | ORAL | 1 refills | Status: DC
Start: 2017-11-05 — End: 2018-02-19

## 2017-11-06 DIAGNOSIS — E119 Type 2 diabetes mellitus without complications: Secondary | ICD-10-CM | POA: Diagnosis not present

## 2017-11-07 ENCOUNTER — Other Ambulatory Visit: Payer: Medicare HMO

## 2017-11-07 DIAGNOSIS — E119 Type 2 diabetes mellitus without complications: Secondary | ICD-10-CM | POA: Diagnosis not present

## 2017-11-08 DIAGNOSIS — E119 Type 2 diabetes mellitus without complications: Secondary | ICD-10-CM | POA: Diagnosis not present

## 2017-11-09 ENCOUNTER — Ambulatory Visit (HOSPITAL_COMMUNITY)
Admission: RE | Admit: 2017-11-09 | Discharge: 2017-11-09 | Disposition: A | Discharge status: Home / Self Care | Payer: Medicare HMO | Source: Ambulatory Visit | Attending: Internal Medicine | Admitting: Internal Medicine

## 2017-11-09 DIAGNOSIS — K228 Other specified diseases of esophagus: Secondary | ICD-10-CM | POA: Insufficient documentation

## 2017-11-09 DIAGNOSIS — R1310 Dysphagia, unspecified: Secondary | ICD-10-CM | POA: Diagnosis not present

## 2017-11-09 DIAGNOSIS — R1319 Other dysphagia: Secondary | ICD-10-CM

## 2017-11-09 DIAGNOSIS — E119 Type 2 diabetes mellitus without complications: Secondary | ICD-10-CM | POA: Diagnosis not present

## 2017-11-10 DIAGNOSIS — J189 Pneumonia, unspecified organism: Secondary | ICD-10-CM | POA: Diagnosis not present

## 2017-11-10 DIAGNOSIS — J9600 Acute respiratory failure, unspecified whether with hypoxia or hypercapnia: Secondary | ICD-10-CM | POA: Diagnosis not present

## 2017-11-10 DIAGNOSIS — J45909 Unspecified asthma, uncomplicated: Secondary | ICD-10-CM | POA: Diagnosis not present

## 2017-11-10 DIAGNOSIS — E119 Type 2 diabetes mellitus without complications: Secondary | ICD-10-CM | POA: Diagnosis not present

## 2017-11-10 DIAGNOSIS — D509 Iron deficiency anemia, unspecified: Secondary | ICD-10-CM | POA: Diagnosis not present

## 2017-11-12 DIAGNOSIS — E119 Type 2 diabetes mellitus without complications: Secondary | ICD-10-CM | POA: Diagnosis not present

## 2017-11-13 DIAGNOSIS — E119 Type 2 diabetes mellitus without complications: Secondary | ICD-10-CM | POA: Diagnosis not present

## 2017-11-14 ENCOUNTER — Other Ambulatory Visit: Payer: Medicare HMO

## 2017-11-14 DIAGNOSIS — E119 Type 2 diabetes mellitus without complications: Secondary | ICD-10-CM | POA: Diagnosis not present

## 2017-11-15 DIAGNOSIS — E119 Type 2 diabetes mellitus without complications: Secondary | ICD-10-CM | POA: Diagnosis not present

## 2017-11-19 ENCOUNTER — Ambulatory Visit
Admission: RE | Admit: 2017-11-19 | Discharge: 2017-11-19 | Disposition: A | Discharge status: Home / Self Care | Payer: Medicare HMO | Source: Ambulatory Visit | Attending: Nurse Practitioner | Admitting: Nurse Practitioner

## 2017-11-19 DIAGNOSIS — K7460 Unspecified cirrhosis of liver: Secondary | ICD-10-CM | POA: Diagnosis not present

## 2017-11-19 DIAGNOSIS — K7469 Other cirrhosis of liver: Secondary | ICD-10-CM

## 2017-11-20 ENCOUNTER — Ambulatory Visit (INDEPENDENT_AMBULATORY_CARE_PROVIDER_SITE_OTHER): Payer: Medicare HMO | Admitting: Diagnostic Neuroimaging

## 2017-11-20 ENCOUNTER — Encounter: Payer: Self-pay | Admitting: Diagnostic Neuroimaging

## 2017-11-20 VITALS — BP 118/67 | HR 78 | Ht 61.0 in | Wt 230.4 lb

## 2017-11-20 DIAGNOSIS — R200 Anesthesia of skin: Secondary | ICD-10-CM

## 2017-11-20 NOTE — Patient Instructions (Signed)
-   trial of carpal tunnel wrist splints (wear at bedtime)  - if not improving, then consider EMG/NCS

## 2017-11-20 NOTE — Progress Notes (Signed)
[facility]  PATIENT: Genessis Flanary Tolsma DOB: 02-26-1946  REFERRING CLINICIAN: Osei-Bonsu HISTORY FROM: patient  REASON FOR VISIT: new consult    HISTORICAL  CHIEF COMPLAINT:  Chief Complaint  Patient presents with  . NP Dr. Julio Sicks  . Parethesia's    Left hand. finger arm, thigh,    HISTORY OF PRESENT ILLNESS:   72 year old female with hypertension, diabetes, hypercholesterolemia, heart disease, obesity, arthritis, here for evaluation of numbness in the hands and feet.  For past 3 months patient has had intermittent, annoying, numbness and tingling in bilateral fingertips.  Symptoms fluctuate.  Symptoms more than left and right side.  Also has some intermittent numbness and tingling in her toes and feet.  She has history of lumbar degenerative disease and diffuse joint pains.  Patient denies any neck pain.  Denies any problems with her arms proximal to her wrist.  She reports good diabetes control with recent morning fasting sugars of 100-110 range.  The last hemoglobin A1c was 6.1.  Patient has tried gabapentin without relief.  She was prescribed Lyrica but she did not try this medication because of concern of potential side effects.   REVIEW OF SYSTEMS: Full 14 system review of systems performed and negative with exception of: Insomnia sleepiness numbness weakness not no sleep cramps feeling cold anemia blurred vision weight gain fatigue swelling in legs constipation incontinence.  ALLERGIES: Allergies  Allergen Reactions  . Fish Allergy Anaphylaxis, Hives and Swelling  . Peanuts [Peanut Oil] Anaphylaxis, Hives, Itching and Swelling  . Hydrocodone Nausea Only  . Iodinated Diagnostic Agents Hives, Itching and Swelling  . Other     Hypersensitive to perfumes, colognes, powders, lotions, room sprays  . Oxycodone Nausea Only and Other (See Comments)    Sweats and dizziness  . Oxycodone-Acetaminophen Other (See Comments)    unknown  . Penicillins Hives,  Itching and Swelling    Has patient had a PCN reaction causing immediate rash, facial/tongue/throat swelling, SOB or lightheadedness with hypotension: Yes Has patient had a PCN reaction causing severe rash involving mucus membranes or skin necrosis: Yes Has patient had a PCN reaction that required hospitalization Yes Has patient had a PCN reaction occurring within the last 10 years: No If all of the above answers are "NO", then may proceed with Cephalosporin use.   Marland Kitchen Percocet [Oxycodone-Acetaminophen] Nausea Only and Other (See Comments)    Raised blood pressure, Sweats, nervous,dizziness  . Sulfa Antibiotics     HOME MEDICATIONS: Outpatient Medications Prior to Visit  Medication Sig Dispense Refill  . Acetaminophen 500 MG coapsule     . acetaminophen-codeine (TYLENOL #3) 300-30 MG tablet Take 1 tablet by mouth three times a day as needed for pain    . albuterol (PROVENTIL HFA;VENTOLIN HFA) 108 (90 BASE) MCG/ACT inhaler Inhale 2 puffs into the lungs every 6 (six) hours as needed for wheezing or shortness of breath (wheezing).     Marland Kitchen atorvastatin (LIPITOR) 40 MG tablet Take 1 tablet (40 mg total) by mouth daily. 90 tablet 1  . bumetanide (BUMEX) 2 MG tablet Take 1 mg by mouth 2 (two) times a week. Monday and Thursday.    . clobetasol (TEMOVATE) 0.05 % GEL Apply 1 application topically 2 (two) times daily. (Patient taking differently: Apply 1 application topically as needed. ) 30 each 0  . cyanocobalamin 1000 MCG tablet Take 1,000 mcg by mouth once a week. Monday    . cyclobenzaprine (FLEXERIL) 10 MG tablet Take 10 mg by mouth daily.     Marland Kitchen  diclofenac sodium (VOLTAREN) 1 % GEL Apply topically 4 (four) times daily.    . diphenoxylate-atropine (LOMOTIL) 2.5-0.025 MG tablet Take by mouth 4 (four) times daily as needed for diarrhea or loose stools.    . ergocalciferol (VITAMIN D2) 50000 UNITS capsule Take 50,000 Units by mouth once a week. Thursday.    . ferrous sulfate 325 (65 FE) MG tablet Take  325 mg by mouth 2 (two) times daily with a meal.     . fluticasone (FLONASE) 50 MCG/ACT nasal spray Place 1 spray into both nostrils daily as needed for allergies or rhinitis.    . folic acid (FOLVITE) 1 MG tablet Take 1 mg by mouth daily.    Marland Kitchen ipratropium-albuterol (DUONEB) 0.5-2.5 (3) MG/3ML SOLN Inhale 3 mLs into the lungs every 6 (six) hours.    . irbesartan (AVAPRO) 300 MG tablet Take 1 tablet (300 mg total) by mouth daily. 90 tablet 3  . metFORMIN (GLUCOPHAGE) 500 MG tablet Take 500 mg by mouth 2 (two) times daily.    . Metoprolol Tartrate 75 MG TABS Take 1 tablet by mouth daily.    . montelukast (SINGULAIR) 10 MG tablet Take 10 mg by mouth at bedtime.    . pantoprazole (PROTONIX) 40 MG tablet     . pantoprazole sodium (PROTONIX) 40 mg/20 mL PACK Place 40 mg into feeding tube daily.    . potassium chloride (K-DUR) 10 MEQ tablet TAKE 1 TABLET BY MOUTH EVERY OTHER DAY    . potassium chloride (K-DUR,KLOR-CON) 10 MEQ tablet Take 10 mEq by mouth every other day.     . senna (SENOKOT) 8.6 MG TABS tablet Take 8.6 mg by mouth 2 (two) times daily as needed for mild constipation. Reported on 03/06/2016    . SYMBICORT 160-4.5 MCG/ACT inhaler INHALE 2 PUFFS BY MOUTH INTO THE LUNG 2 TIMES A DAY 30.6 g 3  . verapamil (VERELAN PM) 360 MG 24 hr capsule Take 1 capsule (360 mg total) by mouth daily. 30 capsule 0  . meloxicam (MOBIC) 7.5 MG tablet Take 15 mg by mouth daily.     . predniSONE (DELTASONE) 50 MG tablet 1 by mouth daily 4 4 tablet 0  . ezetimibe (ZETIA) 10 MG tablet Take 1 tablet (10 mg total) by mouth daily. 90 tablet 3   No facility-administered medications prior to visit.     PAST MEDICAL HISTORY: Past Medical History:  Diagnosis Date  . Anemia   . Arthritis   . Asthma   . Bronchitis   . COPD (chronic obstructive pulmonary disease) (HCC)   . Diabetes mellitus without complication (HCC)   . GERD (gastroesophageal reflux disease)   . Hay fever   . Hypertension   . Rheumatoid  arthritis (HCC) 1996    PAST SURGICAL HISTORY: Past Surgical History:  Procedure Laterality Date  . ABDOMINAL HYSTERECTOMY    . CESAREAN SECTION    . CESAREAN SECTION    . COLONOSCOPY WITH PROPOFOL N/A 04/02/2015   Procedure: COLONOSCOPY WITH PROPOFOL;  Surgeon: Jeani Hawking, MD;  Location: [facility];  Service: Endoscopy;  Laterality: N/A;  . ESOPHAGOGASTRODUODENOSCOPY (EGD) WITH PROPOFOL N/A 04/02/2015   Procedure: ESOPHAGOGASTRODUODENOSCOPY (EGD) WITH PROPOFOL;  Surgeon: Jeani Hawking, MD;  Location: [facility];  Service: Endoscopy;  Laterality: N/A;  . HEMORRHOID SURGERY    . HERNIA REPAIR    . HERNIA REPAIR      FAMILY HISTORY: Family History  Problem Relation Age of Onset  . Diabetes Mother   . Heart failure Mother   .  Throat cancer Brother        throat cancer   . Asthma Other   . COPD Other   . Hypertension Other   . Stroke Other   . Heart disease Other     SOCIAL HISTORY:  Social History   Socioeconomic History  . Marital status: Single    Spouse name: Not on file  . Number of children: Not on file  . Years of education: Not on file  . Highest education level: Not on file  Social Needs  . Financial resource strain: Not on file  . Food insecurity - worry: Not on file  . Food insecurity - inability: Not on file  . Transportation needs - medical: Not on file  . Transportation needs - non-medical: Not on file  Occupational History  . Occupation: retired  Tobacco Use  . Smoking status: Former Smoker    Packs/day: 0.75    Years: 25.00    Pack years: 18.75    Types: Cigarettes    Last attempt to quit: 01/12/2011    Years since quitting: 6.8  . Smokeless tobacco: Never Used  Substance and Sexual Activity  . Alcohol use: Yes    Alcohol/week: 2.4 oz    Types: 4 Cans of beer per week    Comment: occ  . Drug use: No  . Sexual activity: Not on file  Other Topics Concern  . Not on file  Social History Narrative  . Not on file     PHYSICAL  EXAM  GENERAL EXAM/CONSTITUTIONAL: Vitals:  Vitals:   11/20/17 1436  BP: 118/67  Pulse: 78  Weight: 230 lb 6.4 oz (104.5 kg)  Height: 5\' 1"  (1.549 m)     Body mass index is 43.53 kg/m.  No exam data present  Patient is in no distress; well developed, nourished and groomed; neck is supple  EDENTULOUS  CARDIOVASCULAR:  Examination of carotid arteries is normal; no carotid bruits  Regular rate and rhythm, no murmurs  Examination of peripheral vascular system by observation and palpation is normal  EYES:  Ophthalmoscopic exam of optic discs and posterior segments is normal; no papilledema or hemorrhages  MILD PROPTOSIS  MUSCULOSKELETAL:  Gait, strength, tone, movements noted in Neurologic exam below  NEUROLOGIC: MENTAL STATUS:  No flowsheet data found.  awake, alert, oriented to person, place and time  recent and remote memory intact  normal attention and concentration  language fluent, comprehension intact, naming intact,   fund of knowledge appropriate  CRANIAL NERVE:   2nd - no papilledema on fundoscopic exam  2nd, 3rd, 4th, 6th - pupils equal and reactive to light, visual fields full to confrontation, extraocular muscles intact, no nystagmus  5th - facial sensation symmetric  7th - facial strength symmetric  8th - hearing intact  9th - palate elevates symmetrically, uvula midline  11th - shoulder shrug symmetric  12th - tongue protrusion midline  MOTOR:   normal bulk and tone, full strength in the BUE, BLE  SENSORY:   normal and symmetric to light touch, pinprick, temperature, vibration; EXCEPT SLIGHTLY DECR PP IN BILATERAL FINGERTIPS (DIGITS 3 AND 4)  NEGATIVE PHALENS AND TINELS  COORDINATION:   finger-nose-finger, fine finger movements normal  REFLEXES:   deep tendon reflexes TRACE and symmetric; ABSENT AT ANKLES  GAIT/STATION:   narrow based gait; USES ROLLATOR WALKER    DIAGNOSTIC DATA (LABS, IMAGING, TESTING) - I  reviewed patient records, labs, notes, testing and imaging myself where available.  Lab Results  Component  Value Date   WBC 5.5 07/24/2017   HGB 11.9 07/24/2017   HCT 37.1 07/24/2017   MCV 96.7 07/24/2017   PLT 320 07/24/2017      Component Value Date/Time   NA 139 07/13/2017 0855   NA 144 02/23/2017 1642   NA 142 02/25/2015 1402   K 3.3 (L) 07/13/2017 0855   K 4.0 02/25/2015 1402   CL 97 (L) 07/13/2017 0855   CO2 30 07/13/2017 0855   CO2 30 (H) 02/25/2015 1402   GLUCOSE 101 (H) 07/13/2017 0855   GLUCOSE 89 02/25/2015 1402   BUN 12 07/13/2017 0855   BUN 15 02/23/2017 1642   BUN 11.2 02/25/2015 1402   CREATININE 1.09 (H) 07/13/2017 0855   CREATININE 1.0 02/25/2015 1402   CALCIUM 9.3 07/13/2017 0855   CALCIUM 8.8 02/25/2015 1402   PROT 6.6 07/13/2017 0855   PROT 6.8 02/25/2015 1402   ALBUMIN 3.5 07/13/2017 0855   ALBUMIN 3.7 02/25/2015 1402   AST 15 07/13/2017 0855   AST 24 02/25/2015 1402   ALT 13 (L) 07/13/2017 0855   ALT 23 02/25/2015 1402   ALKPHOS 85 07/13/2017 0855   ALKPHOS 90 02/25/2015 1402   BILITOT 0.5 07/13/2017 0855   BILITOT 0.32 02/25/2015 1402   GFRNONAA 51 (L) 07/13/2017 0855   GFRAA 59 (L) 07/13/2017 0855   Lab Results  Component Value Date   CHOL 161 02/23/2017   HDL 48 02/23/2017   LDLCALC 92 02/23/2017   TRIG 106 02/23/2017   CHOLHDL 3.4 02/23/2017   No results found for: HGBA1C Lab Results  Component Value Date   VITAMINB12 728 01/08/2015   Lab Results  Component Value Date   TSH 0.288 (L) 01/09/2015        ASSESSMENT AND PLAN  72 y.o. year old female here with new onset numbness and tingling in her bilateral hands, slightly in her feet, intermittently, left worse than right side, could represent peripheral neuropathy (metabolic versus compressive).  Cervical and lumbar polyradiculopathy also possible.  Discussed option for further testing including MRI cervical and lumbar spine, EMG nerve conduction study, lab testing versus  conservative management.  Patient would like to try bilateral carpal tunnel response to start and if symptoms do not improve then consider electrical EMG testing.   Dx:  1. Hand numbness     PLAN:  - trial of carpal tunnel wrist splints - if not improving, then consider EMG/NCS - follow up with PCP re: B12 and TSH testing  Orders Placed This Encounter  Procedures  . DME Other see comment   Return pending clinical symptoms; may possibly return for EMG/NCS.    Suanne Marker, MD 11/20/2017, 2:44 PM Certified in Neurology, Neurophysiology and Neuroimaging  [facility] [address], [address] [place], [place] [address] [phone]

## 2017-11-21 ENCOUNTER — Inpatient Hospital Stay: Payer: Medicare HMO | Attending: Hematology

## 2017-11-21 DIAGNOSIS — D509 Iron deficiency anemia, unspecified: Secondary | ICD-10-CM

## 2017-11-21 DIAGNOSIS — E119 Type 2 diabetes mellitus without complications: Secondary | ICD-10-CM | POA: Insufficient documentation

## 2017-11-21 DIAGNOSIS — K921 Melena: Secondary | ICD-10-CM | POA: Diagnosis not present

## 2017-11-21 DIAGNOSIS — D500 Iron deficiency anemia secondary to blood loss (chronic): Secondary | ICD-10-CM | POA: Insufficient documentation

## 2017-11-21 DIAGNOSIS — K7469 Other cirrhosis of liver: Secondary | ICD-10-CM | POA: Diagnosis not present

## 2017-11-21 DIAGNOSIS — Z79899 Other long term (current) drug therapy: Secondary | ICD-10-CM | POA: Diagnosis not present

## 2017-11-21 DIAGNOSIS — I10 Essential (primary) hypertension: Secondary | ICD-10-CM | POA: Insufficient documentation

## 2017-11-21 LAB — CBC WITH DIFFERENTIAL/PLATELET
Abs Granulocyte: 3 10*3/uL (ref 1.5–6.5)
Basophils Absolute: 0 10*3/uL (ref 0.0–0.1)
Basophils Relative: 0 %
EOS ABS: 0.2 10*3/uL (ref 0.0–0.5)
EOS PCT: 3 %
HCT: 39 % (ref 34.8–46.6)
Hemoglobin: 11.8 g/dL (ref 11.6–15.9)
Lymphocytes Relative: 27 %
Lymphs Abs: 1.3 10*3/uL (ref 0.9–3.3)
MCH: 31.2 pg (ref 25.1–34.0)
MCHC: 30.3 g/dL — ABNORMAL LOW (ref 31.5–36.0)
MCV: 103.2 fL — AB (ref 79.5–101.0)
MONO ABS: 0.3 10*3/uL (ref 0.1–0.9)
Monocytes Relative: 7 %
Neutro Abs: 3 10*3/uL (ref 1.5–6.5)
Neutrophils Relative %: 63 %
PLATELETS: 212 10*3/uL (ref 145–400)
RBC: 3.78 MIL/uL (ref 3.70–5.45)
RDW: 13.3 % (ref 11.2–16.1)
WBC: 4.8 10*3/uL (ref 3.9–10.3)

## 2017-11-22 LAB — IRON AND TIBC
Iron: 70 ug/dL (ref 41–142)
SATURATION RATIOS: 26 % (ref 21–57)
TIBC: 273 ug/dL (ref 236–444)
UIBC: 203 ug/dL

## 2017-11-22 LAB — FERRITIN: Ferritin: 173 ng/mL (ref 9–269)

## 2017-11-28 ENCOUNTER — Telehealth: Payer: Self-pay | Admitting: *Deleted

## 2017-11-28 DIAGNOSIS — R202 Paresthesia of skin: Secondary | ICD-10-CM | POA: Diagnosis not present

## 2017-11-28 DIAGNOSIS — R1013 Epigastric pain: Secondary | ICD-10-CM | POA: Diagnosis not present

## 2017-11-28 DIAGNOSIS — M069 Rheumatoid arthritis, unspecified: Secondary | ICD-10-CM | POA: Diagnosis not present

## 2017-11-28 DIAGNOSIS — I10 Essential (primary) hypertension: Secondary | ICD-10-CM | POA: Diagnosis not present

## 2017-11-28 DIAGNOSIS — E119 Type 2 diabetes mellitus without complications: Secondary | ICD-10-CM | POA: Diagnosis not present

## 2017-11-28 DIAGNOSIS — J45909 Unspecified asthma, uncomplicated: Secondary | ICD-10-CM | POA: Diagnosis not present

## 2017-11-28 DIAGNOSIS — E785 Hyperlipidemia, unspecified: Secondary | ICD-10-CM | POA: Diagnosis not present

## 2017-11-28 DIAGNOSIS — E559 Vitamin D deficiency, unspecified: Secondary | ICD-10-CM | POA: Diagnosis not present

## 2017-11-28 DIAGNOSIS — B1920 Unspecified viral hepatitis C without hepatic coma: Secondary | ICD-10-CM | POA: Diagnosis not present

## 2017-11-28 NOTE — Progress Notes (Signed)
Faxed to [facility] supply (who do deliver) prescription for bilateral wrist splints. (fax confirmation received.  [phone]. (placed on prescription to call pt/ attached demographics). 11-23-17 sy

## 2017-11-28 NOTE — Telephone Encounter (Signed)
TCT to patient regarding iron study results. Spoke with patient and informed her that her iron studies were normal and there was no need for IV iron at this time. Pt voiced understanding.

## 2017-12-05 ENCOUNTER — Other Ambulatory Visit: Payer: Self-pay | Admitting: Internal Medicine

## 2017-12-05 DIAGNOSIS — Z8709 Personal history of other diseases of the respiratory system: Secondary | ICD-10-CM | POA: Diagnosis not present

## 2017-12-05 DIAGNOSIS — J449 Chronic obstructive pulmonary disease, unspecified: Secondary | ICD-10-CM | POA: Diagnosis not present

## 2017-12-05 DIAGNOSIS — M069 Rheumatoid arthritis, unspecified: Secondary | ICD-10-CM | POA: Diagnosis not present

## 2017-12-05 DIAGNOSIS — Z8739 Personal history of other diseases of the musculoskeletal system and connective tissue: Secondary | ICD-10-CM | POA: Diagnosis not present

## 2017-12-05 DIAGNOSIS — R0902 Hypoxemia: Secondary | ICD-10-CM | POA: Diagnosis not present

## 2017-12-05 DIAGNOSIS — E119 Type 2 diabetes mellitus without complications: Secondary | ICD-10-CM | POA: Diagnosis not present

## 2017-12-10 DIAGNOSIS — R269 Unspecified abnormalities of gait and mobility: Secondary | ICD-10-CM | POA: Diagnosis not present

## 2017-12-10 DIAGNOSIS — R202 Paresthesia of skin: Secondary | ICD-10-CM | POA: Diagnosis not present

## 2017-12-10 DIAGNOSIS — R251 Tremor, unspecified: Secondary | ICD-10-CM | POA: Diagnosis not present

## 2017-12-11 DIAGNOSIS — J189 Pneumonia, unspecified organism: Secondary | ICD-10-CM | POA: Diagnosis not present

## 2017-12-11 DIAGNOSIS — D509 Iron deficiency anemia, unspecified: Secondary | ICD-10-CM | POA: Diagnosis not present

## 2017-12-11 DIAGNOSIS — J9600 Acute respiratory failure, unspecified whether with hypoxia or hypercapnia: Secondary | ICD-10-CM | POA: Diagnosis not present

## 2017-12-11 DIAGNOSIS — J45909 Unspecified asthma, uncomplicated: Secondary | ICD-10-CM | POA: Diagnosis not present

## 2017-12-14 ENCOUNTER — Other Ambulatory Visit: Payer: Self-pay | Admitting: Internal Medicine

## 2017-12-24 DIAGNOSIS — G5602 Carpal tunnel syndrome, left upper limb: Secondary | ICD-10-CM | POA: Diagnosis not present

## 2017-12-31 ENCOUNTER — Other Ambulatory Visit: Payer: Self-pay | Admitting: Internal Medicine

## 2018-01-02 DIAGNOSIS — G4733 Obstructive sleep apnea (adult) (pediatric): Secondary | ICD-10-CM | POA: Diagnosis not present

## 2018-01-02 DIAGNOSIS — G8929 Other chronic pain: Secondary | ICD-10-CM | POA: Diagnosis not present

## 2018-01-02 DIAGNOSIS — E559 Vitamin D deficiency, unspecified: Secondary | ICD-10-CM | POA: Diagnosis not present

## 2018-01-02 DIAGNOSIS — I10 Essential (primary) hypertension: Secondary | ICD-10-CM | POA: Diagnosis not present

## 2018-01-02 DIAGNOSIS — E119 Type 2 diabetes mellitus without complications: Secondary | ICD-10-CM | POA: Diagnosis not present

## 2018-01-02 DIAGNOSIS — J45909 Unspecified asthma, uncomplicated: Secondary | ICD-10-CM | POA: Diagnosis not present

## 2018-01-02 DIAGNOSIS — E785 Hyperlipidemia, unspecified: Secondary | ICD-10-CM | POA: Diagnosis not present

## 2018-01-02 DIAGNOSIS — M069 Rheumatoid arthritis, unspecified: Secondary | ICD-10-CM | POA: Diagnosis not present

## 2018-01-02 DIAGNOSIS — Z0000 Encounter for general adult medical examination without abnormal findings: Secondary | ICD-10-CM | POA: Diagnosis not present

## 2018-01-02 DIAGNOSIS — R1013 Epigastric pain: Secondary | ICD-10-CM | POA: Diagnosis not present

## 2018-01-02 DIAGNOSIS — B1920 Unspecified viral hepatitis C without hepatic coma: Secondary | ICD-10-CM | POA: Diagnosis not present

## 2018-01-02 DIAGNOSIS — R202 Paresthesia of skin: Secondary | ICD-10-CM | POA: Diagnosis not present

## 2018-01-05 DIAGNOSIS — Z8709 Personal history of other diseases of the respiratory system: Secondary | ICD-10-CM | POA: Diagnosis not present

## 2018-01-05 DIAGNOSIS — Z8739 Personal history of other diseases of the musculoskeletal system and connective tissue: Secondary | ICD-10-CM | POA: Diagnosis not present

## 2018-01-05 DIAGNOSIS — J449 Chronic obstructive pulmonary disease, unspecified: Secondary | ICD-10-CM | POA: Diagnosis not present

## 2018-01-05 DIAGNOSIS — R0902 Hypoxemia: Secondary | ICD-10-CM | POA: Diagnosis not present

## 2018-01-10 DIAGNOSIS — J45909 Unspecified asthma, uncomplicated: Secondary | ICD-10-CM | POA: Diagnosis not present

## 2018-01-10 DIAGNOSIS — J9600 Acute respiratory failure, unspecified whether with hypoxia or hypercapnia: Secondary | ICD-10-CM | POA: Diagnosis not present

## 2018-01-10 DIAGNOSIS — D509 Iron deficiency anemia, unspecified: Secondary | ICD-10-CM | POA: Diagnosis not present

## 2018-01-10 DIAGNOSIS — J189 Pneumonia, unspecified organism: Secondary | ICD-10-CM | POA: Diagnosis not present

## 2018-01-21 ENCOUNTER — Other Ambulatory Visit: Payer: Self-pay | Admitting: Internal Medicine

## 2018-01-23 ENCOUNTER — Ambulatory Visit: Payer: Medicare HMO | Admitting: Hematology

## 2018-01-23 ENCOUNTER — Other Ambulatory Visit: Payer: Medicare HMO

## 2018-01-29 NOTE — Progress Notes (Signed)
[facility]  [phone] Fax:(336) [phone]  Clinic Follow up Note   Patient Care Team: [name], MD as PCP - General (Internal Medicine) Jearld Lesch, MD as Referring Physician (Specialist) Jearld Lesch, MD as Referring Physician (Specialist) Jeani Hawking, MD as Consulting Physician (Gastroenterology) Clyde Lundborg., MD as Referring Physician (Anesthesiology)   Date of Service:  01/31/2018  CHIEF COMPLAINT:  Follow-up iron deficient anemia  HISTORY OF PRESENTING ILLNESS (01/2015):  Anita Gonzalez 72 y.o. female with history of DM 2, HTN, HLD, asthma, rheumatoid arthritis, former smoker, recently moved to this area from [place], is here because of anemia.   She presented to the ED on 01/06/2015 with fever, chills, productive cough, dyspnea, wheezing, chest pain and poor appetite of ~ 1 week duration. In the ED, febrile to 102.42F, tachycardic, WBC 12.5 and chest x-ray suggestive of right lower lobe pneumonia. Treating for CAP, then developed acute kidney injury-improving and narrow complex tachycardia 2/27. Her clinical condition improved and she was discharged home with oxygen on 01/11/2015. During her hospital stay, she was found to have moderate anemia with hemoglobin 8-9, low serum iron and ferritin level. She was referred to our clinic for further evaluation.  She was found to have abnormal CBC from 2011, and has been on iron pill two pill daily, and B12 injection and pill for the past two years. She had EGD, capsule endoscopy and colonoscopy in [place] a few years ago by Dr. Francella Solian, which was normal per pt.   She denies recent chest pain on exertion, (+) shortness of breath on exertion at night, no pre-syncopal episodes, or palpitations. She had not noticed any recent bleeding such as epistaxis, hematuria or hematochezia The patient denies over the counter NSAID ingestion. She is not on antiplatelets agents. She had no  prior history or diagnosis of cancer. Her age appropriate screening programs are up-to-date. She denies any pica and eats a variety of diet. She never donated blood or received blood transfusion  She is still on oxygen. She has some dyspnea at night. No orthopnea. She is able to do all her ADLs and light housework. She was not very physically active even before the hospitalization last month.  CURRENT THERAPY:  1.  IV Feraheme as need, received in 01/2015, 03/2015 and 09/2015, 11/29/2015, 12/06/2015, 12/22/2016, 01/15/2017, 08/02/17 2. Ferrous sulfate 2 tablets a day  INTERIM HISTORY:  Anita Gonzalez returns for follow-up. She was last seen by me 6 months ago. In interim she had not needed IV Feraheme.  She presents to the clinic today noting things or alright for her. She denies any new changes in the last 6 months. She sees her Rheumatologist. She still uses oxygen at home. She takes ferrous sulfate BID, she experiences constipation but it is manageable. She will use Senakot sometimes. She notes she plans to have upper endoscopy soon as she has been having difficulty swallowing. She notes previous black stool was due to oral iron, not GI bleeding.    On review of symptoms, pt notes her rheumatoid arthritis in her b/l knees, posterior > anterior. She ambulates with her walker. She denies any bleeding. Has some constipation from oral iron.       MEDICAL HISTORY:  Past Medical History:  Diagnosis Date  . Anemia   . Arthritis   . Asthma   . Bronchitis   . COPD (chronic obstructive pulmonary disease) (HCC)   . Diabetes mellitus without complication (HCC)   .  GERD (gastroesophageal reflux disease)   . Hay fever   . Hypertension   . Rheumatoid arthritis (HCC) 1996   SURGICAL HISTORY: Past Surgical History:  Procedure Laterality Date  . ABDOMINAL HYSTERECTOMY    . CESAREAN SECTION    . CESAREAN SECTION    . COLONOSCOPY WITH PROPOFOL N/A 04/02/2015   Procedure: COLONOSCOPY WITH PROPOFOL;   Surgeon: Jeani Hawking, MD;  Location: [facility];  Service: Endoscopy;  Laterality: N/A;  . ESOPHAGOGASTRODUODENOSCOPY (EGD) WITH PROPOFOL N/A 04/02/2015   Procedure: ESOPHAGOGASTRODUODENOSCOPY (EGD) WITH PROPOFOL;  Surgeon: Jeani Hawking, MD;  Location: [facility];  Service: Endoscopy;  Laterality: N/A;  . HEMORRHOID SURGERY    . HERNIA REPAIR    . HERNIA REPAIR     SOCIAL HISTORY: Social History   Socioeconomic History  . Marital status: Single    Spouse name: Not on file  . Number of children: Not on file  . Years of education: Not on file  . Highest education level: Not on file  Occupational History  . Occupation: retired  Engineer, production  . Financial resource strain: Not on file  . Food insecurity:    Worry: Not on file    Inability: Not on file  . Transportation needs:    Medical: Not on file    Non-medical: Not on file  Tobacco Use  . Smoking status: Former Smoker    Packs/day: 0.75    Years: 25.00    Pack years: 18.75    Types: Cigarettes    Last attempt to quit: 01/12/2011    Years since quitting: 7.0  . Smokeless tobacco: Never Used  Substance and Sexual Activity  . Alcohol use: Yes    Alcohol/week: 2.4 oz    Types: 4 Cans of beer per week    Comment: occ  . Drug use: No  . Sexual activity: Not on file  Lifestyle  . Physical activity:    Days per week: Not on file    Minutes per session: Not on file  . Stress: Not on file  Relationships  . Social connections:    Talks on phone: Not on file    Gets together: Not on file    Attends religious service: Not on file    Active member of club or organization: Not on file    Attends meetings of clubs or organizations: Not on file    Relationship status: Not on file  . Intimate partner violence:    Fear of current or ex partner: Not on file    Emotionally abused: Not on file    Physically abused: Not on file    Forced sexual activity: Not on file  Other Topics Concern  . Not on file  Social History Narrative   . Not on file   FAMILY HISTORY: Family History  Problem Relation Age of Onset  . Diabetes Mother   . Heart failure Mother   . Throat cancer Brother        throat cancer   . Asthma Other   . COPD Other   . Hypertension Other   . Stroke Other   . Heart disease Other    ALLERGIES:  is allergic to fish allergy; peanuts [peanut oil]; hydrocodone; iodinated diagnostic agents; other; oxycodone; oxycodone-acetaminophen; penicillins; percocet [oxycodone-acetaminophen]; and sulfa antibiotics.  MEDICATIONS:  Current Outpatient Medications  Medication Sig Dispense Refill  . Acetaminophen 500 MG coapsule     . acetaminophen-codeine (TYLENOL #3) 300-30 MG tablet Take 1 tablet by mouth three times  a day as needed for pain    . albuterol (PROVENTIL HFA;VENTOLIN HFA) 108 (90 BASE) MCG/ACT inhaler Inhale 2 puffs into the lungs every 6 (six) hours as needed for wheezing or shortness of breath (wheezing).     Marland Kitchen atorvastatin (LIPITOR) 40 MG tablet Take 1 tablet (40 mg total) by mouth daily. 90 tablet 1  . bumetanide (BUMEX) 2 MG tablet Take 1 mg by mouth 2 (two) times a week. Monday and Thursday.    . clobetasol (TEMOVATE) 0.05 % GEL Apply 1 application topically 2 (two) times daily. (Patient taking differently: Apply 1 application topically as needed. ) 30 each 0  . cyanocobalamin 1000 MCG tablet Take 1,000 mcg by mouth once a week. Monday    . cyclobenzaprine (FLEXERIL) 10 MG tablet Take 10 mg by mouth daily.     . diclofenac sodium (VOLTAREN) 1 % GEL Apply topically 4 (four) times daily.    . diphenoxylate-atropine (LOMOTIL) 2.5-0.025 MG tablet Take by mouth 4 (four) times daily as needed for diarrhea or loose stools.    . ergocalciferol (VITAMIN D2) 50000 UNITS capsule Take 50,000 Units by mouth once a week. Thursday.    . ferrous sulfate 325 (65 FE) MG tablet Take 325 mg by mouth 2 (two) times daily with a meal.     . fluticasone (FLONASE) 50 MCG/ACT nasal spray Place 1 spray into both nostrils  daily as needed for allergies or rhinitis.    . folic acid (FOLVITE) 1 MG tablet Take 1 mg by mouth daily.    Marland Kitchen ipratropium-albuterol (DUONEB) 0.5-2.5 (3) MG/3ML SOLN Inhale 3 mLs into the lungs every 6 (six) hours.    . irbesartan (AVAPRO) 300 MG tablet Take 1 tablet (300 mg total) by mouth daily. 90 tablet 3  . metFORMIN (GLUCOPHAGE) 500 MG tablet Take 500 mg by mouth 2 (two) times daily.    . Metoprolol Tartrate 75 MG TABS Take 1 tablet by mouth daily.    . montelukast (SINGULAIR) 10 MG tablet Take 10 mg by mouth at bedtime.    . pantoprazole (PROTONIX) 40 MG tablet     . pantoprazole sodium (PROTONIX) 40 mg/20 mL PACK Place 40 mg into feeding tube daily.    . potassium chloride (K-DUR,KLOR-CON) 10 MEQ tablet Take 10 mEq by mouth every other day.     . senna (SENOKOT) 8.6 MG TABS tablet Take 8.6 mg by mouth 2 (two) times daily as needed for mild constipation. Reported on 03/06/2016    . SYMBICORT 160-4.5 MCG/ACT inhaler INHALE 2 PUFFS BY MOUTH INTO THE LUNG 2 TIMES A DAY 30.6 g 1  . verapamil (VERELAN PM) 360 MG 24 hr capsule Take 1 capsule (360 mg total) by mouth daily. 30 capsule 0  . ezetimibe (ZETIA) 10 MG tablet Take 1 tablet (10 mg total) by mouth daily. 90 tablet 3   No current facility-administered medications for this visit.    REVIEW OF SYSTEMS:  Constitutional: Denies fevers, chills or abnormal night sweats Eyes: Denies blurriness of vision, double vision or watery eyes Ears, nose, mouth, throat, and face: Denies mucositis or sore throat Respiratory: Denies cough, dyspnea, no wheezes Cardiovascular: Denies palpitation, chest discomfort or lower extremity swelling Gastrointestinal:  Denies nausea, heartburn (+) constipation, manageable Skin: Denies abnormal skin rashes MSK: (+) rheumatoid arthritis in her b/l knees Lymphatics: Denies new lymphadenopathy or easy bruising Neurological:Denies numbness, tingling or new weaknesses Behavioral/Psych: Mood is stable, no new changes    All other systems were reviewed with the  patient and are negative.  PHYSICAL EXAMINATION: ECOG PERFORMANCE STATUS: 2  Vitals:   01/31/18 1304  BP: (!) 157/71  Pulse: 73  Resp: 16  Temp: 97.8 F (36.6 C)  SpO2: 94%   Filed Weights   01/31/18 1304  Weight: 229 lb 11.2 oz (104.2 kg)     GENERAL:alert, no distress and comfortable, in a wheelchair  SKIN: skin color, texture, turgor are normal, no rashes or significant lesions EYES: normal, conjunctiva are pink and non-injected, sclera clear OROPHARYNX:no exudate, no erythema and lips, buccal mucosa, and tongue normal  NECK: supple, thyroid normal size, non-tender, without nodularity LYMPH:  no palpable lymphadenopathy in the cervical, axillary or inguinal LUNGS: clear to auscultation and percussion with normal breathing effort HEART: regular rate & rhythm and no murmurs and no lower extremity edema ABDOMEN:abdomen soft, non-tender and normal bowel sounds Musculoskeletal:no cyanosis of digits and no clubbing (+) Presents in ambulatory with a walker. PSYCH: alert & oriented x 3 with fluent speech NEURO: no focal motor/sensory deficits  LABORATORY DATA:  I have reviewed the data as listed CBC Latest Ref Rng & Units 01/31/2018 11/21/2017 07/24/2017  WBC 3.9 - 10.3 K/uL 6.0 4.8 5.5  Hemoglobin 11.6 - 15.9 g/dL 54.6 56.8 12.7  Hematocrit 34.8 - 46.6 % 37.1 39.0 37.1  Platelets 145 - 400 K/uL 255 212 320   CMP Latest Ref Rng & Units 07/13/2017 02/23/2017 11/03/2015  Glucose 65 - 99 mg/dL 517(G) 97 017(C)  BUN 6 - 20 mg/dL 12 15 9   Creatinine 0.44 - 1.00 mg/dL ) 9.44(H) 6.75(F)  Sodium 135 - 145 mmol/L 139 144 143  Potassium 3.5 - 5.1 mmol/L 3.3(L) 4.5 4.7  Chloride 101 - 111 mmol/L 97(L) 98 102  CO2 22 - 32 mmol/L 30 32(H) 32  Calcium 8.9 - 10.3 mg/dL 9.3 9.8 9.7  Total Protein 6.5 - 8.1 g/dL 6.6 - -  Total Bilirubin 0.3 - 1.2 mg/dL 0.5 - -  Alkaline Phos 38 - 126 U/L 85 - -  AST 15 - 41 U/L 15 - -  ALT 14 - 54 U/L 13(L) -  -    Results for ALANNAH, AVERHART (MRN Darene Lamer) as of 01/31/2018 19:51  Ref. Range 07/24/2017 13:34 11/21/2017 14:44 01/31/2018 12:26  Iron Latest Ref Range: 41 - 142 ug/dL 53 70 48  UIBC Latest Units: ug/dL 02/02/2018 017 793  TIBC Latest Ref Range: 236 - 444 ug/dL 903 009 233  %SAT Latest Ref Range: 21 - 57 % 20 (L)    Saturation Ratios Latest Ref Range: 21 - 57 %  26 17 (L)  Ferritin Latest Ref Range: 9 - 269 ng/mL 94 173 125     RADIOGRAPHIC STUDIES: I have personally reviewed the radiological images as listed and agreed with the findings in the report. No results found. ASSESSMENT & PLAN: 72 y.o. African-American female with past medical history of diabetes, hypertension, rheumatoid arthritis, who has chronic anemia for a few years.  1. Normocytic anemia secondary to iron deficiency, from GI AVM bleeding  -She has moderate anemia with baseline hemoglobin in 8-9 range, MCV normal, ferritin 16, serum iron 13, URBC elevated at 430, iron saturation 3%, this is consistent with iron deficient anemia -Anemia has been a few years, likely secondary to slow GI bleeding. Her stool OB was positive. Colonoscopy showed AVM, last done in 03/2015. -Her baseline SPEP and UPEP were negative.  -She also may have a component of anemia of chronic disease secondary to kidney disease and rheumatoid  arthritis -Her hemoglobin has responded to IV feraheme in the past, she received IV Feraheme 4 times in 2016 and twice in 2018 (January and march) -Labs reviewed, today's CBC and 11/21/17 iron study WNL, today ferritin 125, normal serum iron, slightly low saturation, no need for IV iron. -Last IV Feraheme in 07/2017. Will continue to monitor with labs every 2 months.  -Continue Ferrous Sulfate.  -F/u in 6 months  -Call clinic for increased fatigue, diarrhea, blood in stool.  -Pt plans to have upper endoscopy soon as she has been having difficulty swallowing.  2. Hypertension, diabetes, rheumatoid arthritis, asthma -She  will continue follow-up with her primary care physician and other specialists  Plan -Continue oral iron  -lab every 2 monthsX3, will set up IV Feraheme if ferritin less than 100, or low iron level  -F/u in 6 months   All questions were answered. The patient knows to call the clinic with any problems, questions or concerns. I spent 15 minutes counseling the patient face to face. The total time spent in the appointment was 20 minutes and more than 50% was on counseling.     Malachy Mood, MD 01/31/2018   This document serves as a record of services personally performed by Malachy Mood, MD. It was created on her behalf by Delphina Cahill, a trained medical scribe. The creation of this record is based on the scribe's personal observations and the provider's statements to them.   I have reviewed the above documentation for accuracy and completeness, and I agree with the above.

## 2018-01-30 DIAGNOSIS — R1314 Dysphagia, pharyngoesophageal phase: Secondary | ICD-10-CM | POA: Diagnosis not present

## 2018-01-30 DIAGNOSIS — M069 Rheumatoid arthritis, unspecified: Secondary | ICD-10-CM | POA: Diagnosis not present

## 2018-01-30 DIAGNOSIS — I10 Essential (primary) hypertension: Secondary | ICD-10-CM | POA: Diagnosis not present

## 2018-01-31 ENCOUNTER — Telehealth: Payer: Self-pay

## 2018-01-31 ENCOUNTER — Inpatient Hospital Stay: Payer: Medicare HMO | Attending: Hematology

## 2018-01-31 ENCOUNTER — Inpatient Hospital Stay (HOSPITAL_BASED_OUTPATIENT_CLINIC_OR_DEPARTMENT_OTHER): Payer: Medicare HMO | Admitting: Hematology

## 2018-01-31 ENCOUNTER — Encounter: Payer: Self-pay | Admitting: Hematology

## 2018-01-31 VITALS — BP 157/71 | HR 73 | Temp 97.8°F | Resp 16 | Ht 61.0 in | Wt 229.7 lb

## 2018-01-31 DIAGNOSIS — E119 Type 2 diabetes mellitus without complications: Secondary | ICD-10-CM | POA: Diagnosis not present

## 2018-01-31 DIAGNOSIS — Z7984 Long term (current) use of oral hypoglycemic drugs: Secondary | ICD-10-CM | POA: Diagnosis not present

## 2018-01-31 DIAGNOSIS — E785 Hyperlipidemia, unspecified: Secondary | ICD-10-CM | POA: Insufficient documentation

## 2018-01-31 DIAGNOSIS — I10 Essential (primary) hypertension: Secondary | ICD-10-CM | POA: Insufficient documentation

## 2018-01-31 DIAGNOSIS — K922 Gastrointestinal hemorrhage, unspecified: Secondary | ICD-10-CM

## 2018-01-31 DIAGNOSIS — D500 Iron deficiency anemia secondary to blood loss (chronic): Secondary | ICD-10-CM | POA: Diagnosis not present

## 2018-01-31 DIAGNOSIS — Z87891 Personal history of nicotine dependence: Secondary | ICD-10-CM | POA: Insufficient documentation

## 2018-01-31 DIAGNOSIS — D509 Iron deficiency anemia, unspecified: Secondary | ICD-10-CM

## 2018-01-31 DIAGNOSIS — K219 Gastro-esophageal reflux disease without esophagitis: Secondary | ICD-10-CM | POA: Insufficient documentation

## 2018-01-31 DIAGNOSIS — J449 Chronic obstructive pulmonary disease, unspecified: Secondary | ICD-10-CM | POA: Diagnosis not present

## 2018-01-31 DIAGNOSIS — Z79899 Other long term (current) drug therapy: Secondary | ICD-10-CM | POA: Insufficient documentation

## 2018-01-31 DIAGNOSIS — M069 Rheumatoid arthritis, unspecified: Secondary | ICD-10-CM | POA: Insufficient documentation

## 2018-01-31 LAB — CBC WITH DIFFERENTIAL/PLATELET
BASOS ABS: 0 10*3/uL (ref 0.0–0.1)
BASOS PCT: 1 %
Eosinophils Absolute: 0.2 10*3/uL (ref 0.0–0.5)
Eosinophils Relative: 3 %
HEMATOCRIT: 37.1 % (ref 34.8–46.6)
HEMOGLOBIN: 11.8 g/dL (ref 11.6–15.9)
Lymphocytes Relative: 28 %
Lymphs Abs: 1.7 10*3/uL (ref 0.9–3.3)
MCH: 30.9 pg (ref 25.1–34.0)
MCHC: 31.8 g/dL (ref 31.5–36.0)
MCV: 97.2 fL (ref 79.5–101.0)
Monocytes Absolute: 0.5 10*3/uL (ref 0.1–0.9)
Monocytes Relative: 8 %
NEUTROS ABS: 3.6 10*3/uL (ref 1.5–6.5)
NEUTROS PCT: 60 %
Platelets: 255 10*3/uL (ref 145–400)
RBC: 3.82 MIL/uL (ref 3.70–5.45)
RDW: 13.9 % (ref 11.2–14.5)
WBC: 6 10*3/uL (ref 3.9–10.3)

## 2018-01-31 LAB — IRON AND TIBC
Iron: 48 ug/dL (ref 41–142)
SATURATION RATIOS: 17 % — AB (ref 21–57)
TIBC: 276 ug/dL (ref 236–444)
UIBC: 228 ug/dL

## 2018-01-31 LAB — FERRITIN: FERRITIN: 125 ng/mL (ref 9–269)

## 2018-01-31 NOTE — Telephone Encounter (Signed)
Printed avs and calender of upcoming appointment. Per 3/21 los 

## 2018-02-02 DIAGNOSIS — J449 Chronic obstructive pulmonary disease, unspecified: Secondary | ICD-10-CM | POA: Diagnosis not present

## 2018-02-02 DIAGNOSIS — Z8739 Personal history of other diseases of the musculoskeletal system and connective tissue: Secondary | ICD-10-CM | POA: Diagnosis not present

## 2018-02-02 DIAGNOSIS — Z8709 Personal history of other diseases of the respiratory system: Secondary | ICD-10-CM | POA: Diagnosis not present

## 2018-02-02 DIAGNOSIS — R0902 Hypoxemia: Secondary | ICD-10-CM | POA: Diagnosis not present

## 2018-02-04 ENCOUNTER — Other Ambulatory Visit: Payer: Self-pay | Admitting: Gastroenterology

## 2018-02-04 DIAGNOSIS — R1310 Dysphagia, unspecified: Secondary | ICD-10-CM | POA: Diagnosis not present

## 2018-02-04 DIAGNOSIS — K219 Gastro-esophageal reflux disease without esophagitis: Secondary | ICD-10-CM | POA: Diagnosis not present

## 2018-02-04 DIAGNOSIS — J449 Chronic obstructive pulmonary disease, unspecified: Secondary | ICD-10-CM | POA: Diagnosis not present

## 2018-02-04 DIAGNOSIS — R197 Diarrhea, unspecified: Secondary | ICD-10-CM | POA: Diagnosis not present

## 2018-02-04 DIAGNOSIS — K31819 Angiodysplasia of stomach and duodenum without bleeding: Secondary | ICD-10-CM | POA: Diagnosis not present

## 2018-02-06 DIAGNOSIS — G5602 Carpal tunnel syndrome, left upper limb: Secondary | ICD-10-CM | POA: Diagnosis not present

## 2018-02-08 DIAGNOSIS — J45909 Unspecified asthma, uncomplicated: Secondary | ICD-10-CM | POA: Diagnosis not present

## 2018-02-08 DIAGNOSIS — J9600 Acute respiratory failure, unspecified whether with hypoxia or hypercapnia: Secondary | ICD-10-CM | POA: Diagnosis not present

## 2018-02-08 DIAGNOSIS — D509 Iron deficiency anemia, unspecified: Secondary | ICD-10-CM | POA: Diagnosis not present

## 2018-02-08 DIAGNOSIS — J189 Pneumonia, unspecified organism: Secondary | ICD-10-CM | POA: Diagnosis not present

## 2018-02-19 ENCOUNTER — Other Ambulatory Visit: Payer: Self-pay | Admitting: Internal Medicine

## 2018-02-20 NOTE — Telephone Encounter (Signed)
Outpatient Medication Detail    Disp Refills Start End   ezetimibe (ZETIA) 10 MG tablet 90 tablet 3 06/07/2017 09/05/2017   Sig - Route: Take 1 tablet (10 mg total) by mouth daily. - Oral   Sent to pharmacy as: ezetimibe (ZETIA) 10 MG tablet   E-Prescribing Status: Receipt confirmed by pharmacy (06/07/2017 4:32 PM EDT)   Pharmacy   [id number] - [place] ([place]), [place] - [address]

## 2018-02-20 NOTE — Telephone Encounter (Signed)
Outpatient Medication Detail    Disp Refills Start End   atorvastatin (LIPITOR) 40 MG tablet 90 tablet 1 11/05/2017 02/03/2018   Sig - Route: Take 1 tablet (40 mg total) by mouth daily. - Oral   Sent to pharmacy as: atorvastatin (LIPITOR) 40 MG tablet   E-Prescribing Status: Receipt confirmed by pharmacy (11/05/2017 10:32 AM EST)   Pharmacy   [id number] - [place] ([place]), [place] - [address]

## 2018-02-27 DIAGNOSIS — E785 Hyperlipidemia, unspecified: Secondary | ICD-10-CM | POA: Diagnosis not present

## 2018-02-27 DIAGNOSIS — I10 Essential (primary) hypertension: Secondary | ICD-10-CM | POA: Diagnosis not present

## 2018-02-28 DIAGNOSIS — J45909 Unspecified asthma, uncomplicated: Secondary | ICD-10-CM | POA: Diagnosis not present

## 2018-02-28 DIAGNOSIS — E559 Vitamin D deficiency, unspecified: Secondary | ICD-10-CM | POA: Diagnosis not present

## 2018-02-28 DIAGNOSIS — I10 Essential (primary) hypertension: Secondary | ICD-10-CM | POA: Diagnosis not present

## 2018-02-28 DIAGNOSIS — G8929 Other chronic pain: Secondary | ICD-10-CM | POA: Diagnosis not present

## 2018-02-28 DIAGNOSIS — B1920 Unspecified viral hepatitis C without hepatic coma: Secondary | ICD-10-CM | POA: Diagnosis not present

## 2018-02-28 DIAGNOSIS — G4733 Obstructive sleep apnea (adult) (pediatric): Secondary | ICD-10-CM | POA: Diagnosis not present

## 2018-02-28 DIAGNOSIS — E119 Type 2 diabetes mellitus without complications: Secondary | ICD-10-CM | POA: Diagnosis not present

## 2018-02-28 DIAGNOSIS — R1013 Epigastric pain: Secondary | ICD-10-CM | POA: Diagnosis not present

## 2018-02-28 DIAGNOSIS — E785 Hyperlipidemia, unspecified: Secondary | ICD-10-CM | POA: Diagnosis not present

## 2018-03-05 DIAGNOSIS — G5603 Carpal tunnel syndrome, bilateral upper limbs: Secondary | ICD-10-CM | POA: Diagnosis not present

## 2018-03-05 DIAGNOSIS — J449 Chronic obstructive pulmonary disease, unspecified: Secondary | ICD-10-CM | POA: Diagnosis not present

## 2018-03-05 DIAGNOSIS — Z6841 Body Mass Index (BMI) 40.0 and over, adult: Secondary | ICD-10-CM | POA: Diagnosis not present

## 2018-03-05 DIAGNOSIS — Z8739 Personal history of other diseases of the musculoskeletal system and connective tissue: Secondary | ICD-10-CM | POA: Diagnosis not present

## 2018-03-05 DIAGNOSIS — G5602 Carpal tunnel syndrome, left upper limb: Secondary | ICD-10-CM | POA: Diagnosis not present

## 2018-03-05 DIAGNOSIS — R0902 Hypoxemia: Secondary | ICD-10-CM | POA: Diagnosis not present

## 2018-03-05 DIAGNOSIS — Z8709 Personal history of other diseases of the respiratory system: Secondary | ICD-10-CM | POA: Diagnosis not present

## 2018-03-05 DIAGNOSIS — I10 Essential (primary) hypertension: Secondary | ICD-10-CM | POA: Diagnosis not present

## 2018-03-05 DIAGNOSIS — G5601 Carpal tunnel syndrome, right upper limb: Secondary | ICD-10-CM | POA: Diagnosis not present

## 2018-03-10 ENCOUNTER — Other Ambulatory Visit: Payer: Self-pay | Admitting: Internal Medicine

## 2018-03-11 DIAGNOSIS — J189 Pneumonia, unspecified organism: Secondary | ICD-10-CM | POA: Diagnosis not present

## 2018-03-11 DIAGNOSIS — J45909 Unspecified asthma, uncomplicated: Secondary | ICD-10-CM | POA: Diagnosis not present

## 2018-03-11 DIAGNOSIS — D509 Iron deficiency anemia, unspecified: Secondary | ICD-10-CM | POA: Diagnosis not present

## 2018-03-11 DIAGNOSIS — J9600 Acute respiratory failure, unspecified whether with hypoxia or hypercapnia: Secondary | ICD-10-CM | POA: Diagnosis not present

## 2018-03-11 NOTE — Telephone Encounter (Signed)
Can PCP refill this?

## 2018-03-14 ENCOUNTER — Other Ambulatory Visit: Payer: Self-pay

## 2018-03-14 ENCOUNTER — Encounter (HOSPITAL_COMMUNITY): Payer: Self-pay

## 2018-03-15 ENCOUNTER — Ambulatory Visit (HOSPITAL_COMMUNITY): Payer: Medicare HMO | Admitting: Anesthesiology

## 2018-03-15 ENCOUNTER — Ambulatory Visit (HOSPITAL_COMMUNITY)
Admission: RE | Admit: 2018-03-15 | Discharge: 2018-03-15 | Disposition: A | Discharge status: Home / Self Care | Payer: Medicare HMO | Source: Ambulatory Visit | Attending: Gastroenterology | Admitting: Gastroenterology

## 2018-03-15 ENCOUNTER — Encounter (HOSPITAL_COMMUNITY)
Admission: RE | Disposition: A | Discharge status: Home / Self Care | Payer: Self-pay | Source: Ambulatory Visit | Attending: Gastroenterology

## 2018-03-15 ENCOUNTER — Encounter (HOSPITAL_COMMUNITY): Payer: Self-pay

## 2018-03-15 DIAGNOSIS — Z823 Family history of stroke: Secondary | ICD-10-CM | POA: Insufficient documentation

## 2018-03-15 DIAGNOSIS — Z882 Allergy status to sulfonamides status: Secondary | ICD-10-CM | POA: Insufficient documentation

## 2018-03-15 DIAGNOSIS — R1310 Dysphagia, unspecified: Secondary | ICD-10-CM | POA: Insufficient documentation

## 2018-03-15 DIAGNOSIS — Z9981 Dependence on supplemental oxygen: Secondary | ICD-10-CM | POA: Insufficient documentation

## 2018-03-15 DIAGNOSIS — Z91010 Allergy to peanuts: Secondary | ICD-10-CM | POA: Insufficient documentation

## 2018-03-15 DIAGNOSIS — Z888 Allergy status to other drugs, medicaments and biological substances status: Secondary | ICD-10-CM | POA: Diagnosis not present

## 2018-03-15 DIAGNOSIS — K222 Esophageal obstruction: Secondary | ICD-10-CM | POA: Diagnosis not present

## 2018-03-15 DIAGNOSIS — E119 Type 2 diabetes mellitus without complications: Secondary | ICD-10-CM | POA: Diagnosis not present

## 2018-03-15 DIAGNOSIS — Z90710 Acquired absence of both cervix and uterus: Secondary | ICD-10-CM | POA: Diagnosis not present

## 2018-03-15 DIAGNOSIS — Z6841 Body Mass Index (BMI) 40.0 and over, adult: Secondary | ICD-10-CM | POA: Insufficient documentation

## 2018-03-15 DIAGNOSIS — M069 Rheumatoid arthritis, unspecified: Secondary | ICD-10-CM | POA: Insufficient documentation

## 2018-03-15 DIAGNOSIS — Z833 Family history of diabetes mellitus: Secondary | ICD-10-CM | POA: Diagnosis not present

## 2018-03-15 DIAGNOSIS — G4733 Obstructive sleep apnea (adult) (pediatric): Secondary | ICD-10-CM | POA: Diagnosis not present

## 2018-03-15 DIAGNOSIS — M1990 Unspecified osteoarthritis, unspecified site: Secondary | ICD-10-CM | POA: Diagnosis not present

## 2018-03-15 DIAGNOSIS — J449 Chronic obstructive pulmonary disease, unspecified: Secondary | ICD-10-CM | POA: Insufficient documentation

## 2018-03-15 DIAGNOSIS — E669 Obesity, unspecified: Secondary | ICD-10-CM | POA: Insufficient documentation

## 2018-03-15 DIAGNOSIS — K219 Gastro-esophageal reflux disease without esophagitis: Secondary | ICD-10-CM | POA: Insufficient documentation

## 2018-03-15 DIAGNOSIS — Z8619 Personal history of other infectious and parasitic diseases: Secondary | ICD-10-CM | POA: Diagnosis not present

## 2018-03-15 DIAGNOSIS — Z800 Family history of malignant neoplasm of digestive organs: Secondary | ICD-10-CM | POA: Diagnosis not present

## 2018-03-15 DIAGNOSIS — D649 Anemia, unspecified: Secondary | ICD-10-CM | POA: Insufficient documentation

## 2018-03-15 DIAGNOSIS — R002 Palpitations: Secondary | ICD-10-CM | POA: Insufficient documentation

## 2018-03-15 DIAGNOSIS — Z885 Allergy status to narcotic agent status: Secondary | ICD-10-CM | POA: Diagnosis not present

## 2018-03-15 DIAGNOSIS — I10 Essential (primary) hypertension: Secondary | ICD-10-CM | POA: Diagnosis not present

## 2018-03-15 DIAGNOSIS — Z91041 Radiographic dye allergy status: Secondary | ICD-10-CM | POA: Insufficient documentation

## 2018-03-15 DIAGNOSIS — Z8249 Family history of ischemic heart disease and other diseases of the circulatory system: Secondary | ICD-10-CM | POA: Diagnosis not present

## 2018-03-15 DIAGNOSIS — J301 Allergic rhinitis due to pollen: Secondary | ICD-10-CM | POA: Insufficient documentation

## 2018-03-15 DIAGNOSIS — Z87891 Personal history of nicotine dependence: Secondary | ICD-10-CM | POA: Diagnosis not present

## 2018-03-15 DIAGNOSIS — Z825 Family history of asthma and other chronic lower respiratory diseases: Secondary | ICD-10-CM | POA: Diagnosis not present

## 2018-03-15 DIAGNOSIS — R000 Tachycardia, unspecified: Secondary | ICD-10-CM | POA: Insufficient documentation

## 2018-03-15 DIAGNOSIS — Z91013 Allergy to seafood: Secondary | ICD-10-CM | POA: Insufficient documentation

## 2018-03-15 HISTORY — DX: Cardiac arrhythmia, unspecified: I49.9

## 2018-03-15 HISTORY — PX: BALLOON DILATION: SHX5330

## 2018-03-15 HISTORY — DX: Inflammatory liver disease, unspecified: K75.9

## 2018-03-15 HISTORY — PX: ESOPHAGOGASTRODUODENOSCOPY (EGD) WITH PROPOFOL: SHX5813

## 2018-03-15 LAB — GLUCOSE, CAPILLARY: Glucose-Capillary: 94 mg/dL (ref 65–99)

## 2018-03-15 SURGERY — ESOPHAGOGASTRODUODENOSCOPY (EGD) WITH PROPOFOL
Anesthesia: Monitor Anesthesia Care

## 2018-03-15 MED ORDER — PROPOFOL 10 MG/ML IV BOLUS
INTRAVENOUS | Status: AC
  Filled 2018-03-15: qty 40

## 2018-03-15 MED ORDER — ONDANSETRON HCL 4 MG/2ML IJ SOLN
INTRAMUSCULAR | Status: DC | PRN
  Administered 2018-03-15: 4 mg via INTRAVENOUS

## 2018-03-15 MED ORDER — LACTATED RINGERS IV SOLN
INTRAVENOUS | Status: DC
  Administered 2018-03-15: 1000 mL via INTRAVENOUS

## 2018-03-15 MED ORDER — PROPOFOL 10 MG/ML IV BOLUS
INTRAVENOUS | Status: AC
  Filled 2018-03-15: qty 20

## 2018-03-15 MED ORDER — PHENYLEPHRINE 40 MCG/ML (10ML) SYRINGE FOR IV PUSH (FOR BLOOD PRESSURE SUPPORT)
PREFILLED_SYRINGE | INTRAVENOUS | Status: DC | PRN
  Administered 2018-03-15: 80 ug via INTRAVENOUS

## 2018-03-15 MED ORDER — PROPOFOL 10 MG/ML IV BOLUS
INTRAVENOUS | Status: DC | PRN
  Administered 2018-03-15: 30 mg via INTRAVENOUS
  Administered 2018-03-15 (×2): 20 mg via INTRAVENOUS

## 2018-03-15 MED ORDER — PROPOFOL 500 MG/50ML IV EMUL
INTRAVENOUS | Status: DC | PRN
  Administered 2018-03-15: 125 ug/kg/min via INTRAVENOUS

## 2018-03-15 MED ORDER — SODIUM CHLORIDE 0.9 % IV SOLN: INTRAVENOUS | Status: DC

## 2018-03-15 SURGICAL SUPPLY — 14 items

## 2018-03-15 NOTE — Op Note (Signed)
[facility] Patient Name: Anita Gonzalez Procedure Date: 03/15/2018 MRN: [id number] Attending MD: Jeani Hawking , MD Date of Birth: [birth year] CSN: [id number] Age: 72 Admit Type: Outpatient Procedure:                Upper GI endoscopy Indications:              Dysphagia Providers:                Jeani Hawking, MD, Jacquiline Doe, RN, Harrington Challenger,                            Technician Referring MD:              Medicines:                Propofol per Anesthesia Complications:            No immediate complications. Estimated Blood Loss:     Estimated blood loss: none. Procedure:                Pre-Anesthesia Assessment:                           - Prior to the procedure, a History and Physical                            was performed, and patient medications and                            allergies were reviewed. The patient's tolerance of                            previous anesthesia was also reviewed. The risks                            and benefits of the procedure and the sedation                            options and risks were discussed with the patient.                            All questions were answered, and informed consent                            was obtained. Prior Anticoagulants: The patient has                            taken no previous anticoagulant or antiplatelet                            agents. ASA Grade Assessment: III - A patient with                            severe systemic disease. After reviewing the risks                            and benefits,  the patient was deemed in                            satisfactory condition to undergo the procedure.                           - Sedation was administered by an anesthesia                            professional. Deep sedation was attained.                           After obtaining informed consent, the endoscope was                            passed under direct vision. Throughout the                procedure, the patient's blood pressure, pulse, and                            oxygen saturations were monitored continuously. The                            [id number] ([id number]) scope was introduced through the                            mouth, and advanced to the second part of duodenum.                            The upper GI endoscopy was accomplished without                            difficulty. The patient tolerated the procedure                            well. Scope In: Scope Out: Findings:      One benign-appearing, intrinsic mild stenosis was found in the lower       third of the esophagus. This stenosis measured 1.6 cm (inner diameter) x       less than one cm (in length). The stenosis was traversed. A TTS dilator       was passed through the scope. Dilation with a 15-16.5-18 mm balloon       dilator was performed to 18 mm. The dilation site was examined and       showed complete resolution of luminal narrowing. Estimated blood loss:       none.      The stomach was normal.      The examined duodenum was normal.      There was free movement of the balloon at the 15 mm and 16.5 mm       diameters. At 18 mm the balloon was held in place for one minute. The       desired mucosal breaks were achieved with the dilation. Impression:               - Benign-appearing esophageal stenosis. Dilated.                           -  Normal stomach.                           - Normal examined duodenum.                           - No specimens collected. Moderate Sedation:      N/A- Per Anesthesia Care Recommendation:           - Patient has a contact number available for                            emergencies. The signs and symptoms of potential                            delayed complications were discussed with the                            patient. Return to normal activities tomorrow.                            Written discharge instructions were provided to the                             patient.                           - Resume previous diet.                           - Continue present medications.                           - Return to GI clinic in 4 weeks. Procedure Code(s):        --- Professional ---                           [phone], Esophagogastroduodenoscopy, flexible,                            transoral; with transendoscopic balloon dilation of                            esophagus (less than 30 mm diameter) Diagnosis Code(s):        --- Professional ---                           K22.2, Esophageal obstruction                           R13.10, Dysphagia, unspecified CPT copyright 2017 American Medical Association. All rights reserved. The codes documented in this report are preliminary and upon coder review may  be revised to meet current compliance requirements. Jeani Hawking, MD Jeani Hawking, MD 03/15/2018 12:34:18 PM This report has been signed electronically. Number of Addenda: 0

## 2018-03-15 NOTE — Discharge Instructions (Signed)

## 2018-03-15 NOTE — H&P (Signed)
Anita Gonzalez HPI: The patient reports the onset of dysphagia for the past year. The dysphagia is to both solids and liquids, but she denies any problems with nausea, vomiting, or weight loss. Anything heavier than "soups or cereal" will cause her to have problems. There are no complaints of GERD as pantoprazole is very effective for her. Lately she does report using more inhalers. She had an EGD on 04/02/2015 with findings of 8-10 AVMs in the proximal small bowel that were ablated with APC. The procedure was performed along with a colonoscopy for findings of IDA.   Past Medical History:  Diagnosis Date  . Anemia   . Arthritis   . Asthma   . Bronchitis   . COPD (chronic obstructive pulmonary disease) (HCC)   . Diabetes mellitus without complication (HCC)    type 2  . Dysrhythmia    hx of palpitations  . GERD (gastroesophageal reflux disease)   . Hay fever   . Hepatitis    c treated with pills  . Hypertension   . Rheumatoid arthritis (HCC) 1996    Past Surgical History:  Procedure Laterality Date  . ABDOMINAL HYSTERECTOMY    . CESAREAN SECTION    . COLONOSCOPY WITH PROPOFOL N/A 04/02/2015   Procedure: COLONOSCOPY WITH PROPOFOL;  Surgeon: Jeani Hawking, MD;  Location: [facility];  Service: Endoscopy;  Laterality: N/A;  . ESOPHAGOGASTRODUODENOSCOPY (EGD) WITH PROPOFOL N/A 04/02/2015   Procedure: ESOPHAGOGASTRODUODENOSCOPY (EGD) WITH PROPOFOL;  Surgeon: Jeani Hawking, MD;  Location: [facility];  Service: Endoscopy;  Laterality: N/A;  . HEMORRHOID SURGERY    . HERNIA REPAIR    . HERNIA REPAIR      Family History  Problem Relation Age of Onset  . Diabetes Mother   . Heart failure Mother   . Throat cancer Brother        throat cancer   . Asthma Other   . COPD Other   . Hypertension Other   . Stroke Other   . Heart disease Other     Social History:  reports that she quit smoking about 7 years ago. Her smoking use included cigarettes. She has a 18.75 pack-year smoking  history. She has never used smokeless tobacco. She reports that she drinks about 2.4 oz of alcohol per week. She reports that she does not use drugs.  Allergies:  Allergies  Allergen Reactions  . Fish Allergy Anaphylaxis, Hives and Swelling  . Peanuts [Peanut Oil] Anaphylaxis, Hives, Itching and Swelling  . Hydrocodone Nausea Only  . Iodinated Diagnostic Agents Hives, Itching and Swelling  . Other     Hypersensitive to perfumes, colognes, powders, lotions, room sprays  . Oxycodone Nausea Only and Other (See Comments)    Sweats and dizziness  . Oxycodone-Acetaminophen Other (See Comments)    unknown  . Penicillins Hives, Itching and Swelling    Has patient had a PCN reaction causing immediate rash, facial/tongue/throat swelling, SOB or lightheadedness with hypotension: Yes Has patient had a PCN reaction causing severe rash involving mucus membranes or skin necrosis: Yes Has patient had a PCN reaction that required hospitalization Yes Has patient had a PCN reaction occurring within the last 10 years: No If all of the above answers are "NO", then may proceed with Cephalosporin use.   Marland Kitchen Percocet [Oxycodone-Acetaminophen] Nausea Only and Other (See Comments)    Raised blood pressure, Sweats, nervous,dizziness  . Sulfa Antibiotics Other (See Comments)    Unknown    Medications:  Scheduled:  Continuous: . sodium chloride    .  lactated ringers      No results found for this or any previous visit (from the past 24 hour(s)).   No results found.  ROS:  As stated above in the HPI otherwise negative.  Blood pressure 115/63, pulse (!) 58, temperature 98.2 F (36.8 C), temperature source Oral, resp. rate 18, height 5\' 1"  (1.549 m), weight 103.9 kg (229 lb), SpO2 94 %.    PE: Gen: NAD, Alert and Oriented HEENT:  [place]/AT, EOMI Neck: Supple, no LAD Lungs: CTA Bilaterally CV: RRR without M/G/R ABM: Soft, NTND, +BS Ext: No C/C/E  Assessment/Plan: 1) Dysphagia - EGD with  dilation.  Anita Gonzalez D 03/15/2018, 11:57 AM

## 2018-03-15 NOTE — Anesthesia Postprocedure Evaluation (Signed)
Anesthesia Post Note  Patient: Anita Gonzalez  Procedure(s) Performed: ESOPHAGOGASTRODUODENOSCOPY (EGD) WITH PROPOFOL (N/A ) BALLOON DILATION (N/A )     Patient location during evaluation: Endoscopy Anesthesia Type: MAC Level of consciousness: awake and alert Pain management: pain level controlled Vital Signs Assessment: post-procedure vital signs reviewed and stable Respiratory status: spontaneous breathing, nonlabored ventilation, respiratory function stable and patient connected to nasal cannula oxygen Cardiovascular status: stable and blood pressure returned to baseline Postop Assessment: no apparent nausea or vomiting Anesthetic complications: no    Last Vitals:  Vitals:   03/15/18 1228 03/15/18 1240  BP: 107/71 107/61  Pulse: 65 (!) 53  Resp: 20 (!) 21  Temp: 36.8 C   SpO2: 91% 100%    Last Pain:  Vitals:   03/15/18 1240  TempSrc:   PainSc: 0-No pain                 Montez Hageman

## 2018-03-15 NOTE — Transfer of Care (Signed)
Immediate Anesthesia Transfer of Care Note  Patient: Anita Gonzalez  Procedure(s) Performed: ESOPHAGOGASTRODUODENOSCOPY (EGD) WITH PROPOFOL (N/A ) BALLOON DILATION (N/A )  Patient Location: PACU  Anesthesia Type:MAC  Level of Consciousness: sedated  Airway & Oxygen Therapy: Patient Spontanous Breathing and Patient connected to nasal cannula oxygen  Post-op Assessment: Report given to RN and Post -op Vital signs reviewed and stable  Post vital signs: Reviewed and stable  Last Vitals:  Vitals Value Taken Time  BP    Temp    Pulse 59 03/15/2018 12:30 PM  Resp 25 03/15/2018 12:30 PM  SpO2 95 % 03/15/2018 12:30 PM  Vitals shown include unvalidated device data.  Last Pain:  Vitals:   03/15/18 1228  TempSrc:   PainSc: 0-No pain         Complications: No apparent anesthesia complications

## 2018-03-15 NOTE — Anesthesia Preprocedure Evaluation (Signed)
Anesthesia Evaluation  Patient identified by MRN, date of birth, ID band Patient awake    Reviewed: Allergy & Precautions, H&P , NPO status , Patient's Chart, lab work & pertinent test results, reviewed documented beta blocker date and time   Airway Mallampati: II  TM Distance: >3 FB Neck ROM: full    Dental  (+) Edentulous Upper, Edentulous Lower, Dental Advisory Given Only a few teeth left:   Pulmonary asthma , COPD,  oxygen dependent, former smoker,    Pulmonary exam normal breath sounds clear to auscultation       Cardiovascular Exercise Tolerance: Good hypertension, Pt. on home beta blockers Normal cardiovascular exam Rhythm:regular Rate:Normal  Sinus tachycardia   Neuro/Psych negative neurological ROS  negative psych ROS   GI/Hepatic negative GI ROS, Neg liver ROS,   Endo/Other  diabetes, Well Controlled, Type 2, Oral Hypoglycemic AgentsMorbid obesity  Renal/GU negative Renal ROS  negative genitourinary   Musculoskeletal  (+) Arthritis , Rheumatoid disorders,    Abdominal (+) + obese,   Peds  Hematology negative hematology ROS (+)   Anesthesia Other Findings   Reproductive/Obstetrics negative OB ROS                             Anesthesia Physical  Anesthesia Plan  ASA: III  Anesthesia Plan: MAC   Post-op Pain Management:    Induction:   PONV Risk Score and Plan: 2 and Ondansetron and Treatment may vary due to age or medical condition  Airway Management Planned: Nasal Cannula  Additional Equipment:   Intra-op Plan:   Post-operative Plan:   Informed Consent: I have reviewed the patients History and Physical, chart, labs and discussed the procedure including the risks, benefits and alternatives for the proposed anesthesia with the patient or authorized representative who has indicated his/her understanding and acceptance.   Dental Advisory Given  Plan Discussed with:  CRNA and Surgeon  Anesthesia Plan Comments:         Anesthesia Quick Evaluation

## 2018-03-18 ENCOUNTER — Encounter (HOSPITAL_COMMUNITY): Payer: Self-pay | Admitting: Gastroenterology

## 2018-03-19 DIAGNOSIS — M545 Low back pain: Secondary | ICD-10-CM | POA: Diagnosis not present

## 2018-03-19 DIAGNOSIS — M25562 Pain in left knee: Secondary | ICD-10-CM | POA: Diagnosis not present

## 2018-03-19 DIAGNOSIS — M1712 Unilateral primary osteoarthritis, left knee: Secondary | ICD-10-CM | POA: Diagnosis not present

## 2018-03-19 DIAGNOSIS — M1711 Unilateral primary osteoarthritis, right knee: Secondary | ICD-10-CM | POA: Diagnosis not present

## 2018-03-19 DIAGNOSIS — M25561 Pain in right knee: Secondary | ICD-10-CM | POA: Diagnosis not present

## 2018-03-28 ENCOUNTER — Inpatient Hospital Stay: Payer: Medicare HMO

## 2018-04-01 DIAGNOSIS — B351 Tinea unguium: Secondary | ICD-10-CM | POA: Diagnosis not present

## 2018-04-01 DIAGNOSIS — E1151 Type 2 diabetes mellitus with diabetic peripheral angiopathy without gangrene: Secondary | ICD-10-CM | POA: Diagnosis not present

## 2018-04-01 DIAGNOSIS — L03032 Cellulitis of left toe: Secondary | ICD-10-CM | POA: Diagnosis not present

## 2018-04-01 DIAGNOSIS — M79671 Pain in right foot: Secondary | ICD-10-CM | POA: Diagnosis not present

## 2018-04-01 DIAGNOSIS — M79672 Pain in left foot: Secondary | ICD-10-CM | POA: Diagnosis not present

## 2018-04-03 DIAGNOSIS — I10 Essential (primary) hypertension: Secondary | ICD-10-CM | POA: Diagnosis not present

## 2018-04-03 DIAGNOSIS — E785 Hyperlipidemia, unspecified: Secondary | ICD-10-CM | POA: Diagnosis not present

## 2018-04-04 DIAGNOSIS — Z8709 Personal history of other diseases of the respiratory system: Secondary | ICD-10-CM | POA: Diagnosis not present

## 2018-04-04 DIAGNOSIS — R0902 Hypoxemia: Secondary | ICD-10-CM | POA: Diagnosis not present

## 2018-04-04 DIAGNOSIS — Z8739 Personal history of other diseases of the musculoskeletal system and connective tissue: Secondary | ICD-10-CM | POA: Diagnosis not present

## 2018-04-04 DIAGNOSIS — J449 Chronic obstructive pulmonary disease, unspecified: Secondary | ICD-10-CM | POA: Diagnosis not present

## 2018-04-09 ENCOUNTER — Other Ambulatory Visit: Payer: Self-pay | Admitting: Neurosurgery

## 2018-04-10 ENCOUNTER — Telehealth: Payer: Self-pay | Admitting: *Deleted

## 2018-04-10 DIAGNOSIS — J189 Pneumonia, unspecified organism: Secondary | ICD-10-CM | POA: Diagnosis not present

## 2018-04-10 DIAGNOSIS — J45909 Unspecified asthma, uncomplicated: Secondary | ICD-10-CM | POA: Diagnosis not present

## 2018-04-10 DIAGNOSIS — D509 Iron deficiency anemia, unspecified: Secondary | ICD-10-CM | POA: Diagnosis not present

## 2018-04-10 DIAGNOSIS — J9600 Acute respiratory failure, unspecified whether with hypoxia or hypercapnia: Secondary | ICD-10-CM | POA: Diagnosis not present

## 2018-04-10 NOTE — Telephone Encounter (Signed)
"  Calling to update I recently moved.  No change in phone number.  I moved to [address] "[address] in cat", [place], [place]., [address]."  Demographics updated.  Call received from [phone] identified as Anita Gonzalez per [place] caller ID added to pt. Demographics.

## 2018-04-11 ENCOUNTER — Inpatient Hospital Stay: Payer: Medicare HMO | Attending: Hematology

## 2018-04-11 ENCOUNTER — Telehealth: Payer: Self-pay | Admitting: *Deleted

## 2018-04-11 NOTE — Telephone Encounter (Signed)
Received call from pt stating that she was unable to make her appt today due to no transportation.  She needs scheduling to call her back to r/s.  Message to scheduling & call routed to Dr Feng/Pod RN

## 2018-04-12 ENCOUNTER — Telehealth: Payer: Self-pay | Admitting: Hematology

## 2018-04-12 NOTE — Telephone Encounter (Signed)
Verified 04-18-2018 appointment with this call.  Asked to arrive at 1:00 pm for 1:30 pm lab appointment.  Denies further  questions or needs at this time.

## 2018-04-12 NOTE — Telephone Encounter (Signed)
Called patient regarding rescheduling her missed appointment per 5/30 sch msg

## 2018-04-15 NOTE — Pre-Procedure Instructions (Signed)
Anita Gonzalez  04/15/2018      [facility] - [place], [place] - [address] [place] [address] Phone: [phone] Fax: [phone]    Your procedure is scheduled on  Friday 04/19/18  Report to [facility] Admitting at 1000 A.M.  Call this number if you have problems the morning of surgery:  [phone]   Remember:  No food OR DRINK after midnight.     Take these medicines the morning of surgery with A SIP OF WATER - TYLENOL 3 IF NEEDED, ALBUTEROL, DUONEB IF NEEDED, METOPROLOL (LOPRESSOR), PANTOPRAZOLE (PROTONIX), SYMBICORT, VERAPAMIL    7 days prior to surgery STOP taking any Aspirin(unless otherwise instructed by your surgeon), Aleve, Naproxen, DICLOFENAC GEL,  Ibuprofen, Motrin, Advil, Goody's, BC's, all herbal medications, fish oil, and all vitamins    How to Manage Your Diabetes Before and After Surgery  Why is it important to control my blood sugar before and after surgery? . Improving blood sugar levels before and after surgery helps healing and can limit problems. . A way of improving blood sugar control is eating a healthy diet by: o  Eating less sugar and carbohydrates o  Increasing activity/exercise o  Talking with your doctor about reaching your blood sugar goals . High blood sugars (greater than 180 mg/dL) can raise your risk of infections and slow your recovery, so you will need to focus on controlling your diabetes during the weeks before surgery. . Make sure that the doctor who takes care of your diabetes knows about your planned surgery including the date and location.  How do I manage my blood sugar before surgery? . Check your blood sugar at least 4 times a day, starting 2 days before surgery, to make sure that the level is not too high or low. o Check your blood sugar the morning of your surgery when you wake up and every 2 hours until you get to the Short Stay unit. . If your blood sugar is less  than 70 mg/dL, you will need to treat for low blood sugar: o Do not take insulin. o Treat a low blood sugar (less than 70 mg/dL) with  cup of clear juice (cranberry or apple), 4 glucose tablets, OR glucose gel. Recheck blood sugar in 15 minutes after treatment (to make sure it is greater than 70 mg/dL). If your blood sugar is not greater than 70 mg/dL on recheck, call [phone] o  for further instructions. . Report your blood sugar to the short stay nurse when you get to Short Stay.  . If you are admitted to the hospital after surgery: o Your blood sugar will be checked by the staff and you will probably be given insulin after surgery (instead of oral diabetes medicines) to make sure you have good blood sugar levels. o The goal for blood sugar control after surgery is 80-180 mg/dL.              WHAT DO I DO ABOUT MY DIABETES MEDICATION?   Marland Kitchen Do not take oral diabetes medicines (pills) the morning of surgery.   Other Instructions:          Patient Signature:  Date:   Nurse Signature:  Date:   Reviewed and Endorsed by [facility] Patient Education Committee, August 2015  Do not wear jewelry, make-up or nail polish.  Do not wear lotions, powders, or perfumes, or deodorant.  Do not shave 48 hours prior to surgery.  Men may shave face and neck.  Do not bring valuables to the hospital.  [facility] is not responsible for any belongings or valuables.  Contacts, dentures or bridgework may not be worn into surgery.  Leave your suitcase in the car.  After surgery it may be brought to your room.  For patients admitted to the hospital, discharge time will be determined by your treatment team.  Patients discharged the day of surgery will not be allowed to drive home.   Name and phone number of your driver:    Special instructions:  [place] - Preparing for Surgery  Before surgery, you can play an important role.  Because skin is not sterile, your skin needs to be as  free of germs as possible.  You can reduce the number of germs on you skin by washing with CHG (chlorahexidine gluconate) soap before surgery.  CHG is an antiseptic cleaner which kills germs and bonds with the skin to continue killing germs even after washing.  Oral Hygiene is also important in reducing the risk of infection.  Remember to brush your teeth with your regular toothpaste the morning of surgery.  Please DO NOT use if you have an allergy to CHG or antibacterial soaps.  If your skin becomes reddened/irritated stop using the CHG and inform your nurse when you arrive at Short Stay.  Do not shave (including legs and underarms) for at least 48 hours prior to the first CHG shower.  You may shave your face.  Please follow these instructions carefully:   1.  Shower with CHG Soap the night before surgery and the morning of Surgery.  2.  If you choose to wash your hair, wash your hair first as usual with your normal shampoo.  3.  After you shampoo, rinse your hair and body thoroughly to remove the shampoo. 4.  Use CHG as you would any other liquid soap.  You can apply chg directly to the skin and wash gently with a      scrungie or washcloth.           5.  Apply the CHG Soap to your body ONLY FROM THE NECK DOWN.   Do not use on open wounds or open sores. Avoid contact with your eyes, ears, mouth and genitals (private parts).  Wash genitals (private parts) with your normal soap.  6.  Wash thoroughly, paying special attention to the area where your surgery will be performed.  7.  Thoroughly rinse your body with warm water from the neck down.  8.  DO NOT shower/wash with your normal soap after using and rinsing off the CHG Soap.  9.  Pat yourself dry with a clean towel.            10.  Wear clean pajamas.            11.  Place clean sheets on your bed the night of your first shower and do not sleep with pets.  Day of Surgery  Do not apply any lotions/deoderants the morning of surgery.   Please  wear clean clothes to the hospital/surgery center. Remember to brush your teeth with toothpaste.     Please read over the following fact sheets that you were given. Pain Booklet

## 2018-04-16 ENCOUNTER — Other Ambulatory Visit: Payer: Self-pay | Admitting: Internal Medicine

## 2018-04-16 ENCOUNTER — Inpatient Hospital Stay (HOSPITAL_COMMUNITY)
Admission: RE | Admit: 2018-04-16 | Discharge: 2018-04-16 | Disposition: A | Discharge status: Home / Self Care | Payer: Medicare HMO | Source: Ambulatory Visit

## 2018-04-18 ENCOUNTER — Other Ambulatory Visit: Payer: Self-pay

## 2018-04-18 ENCOUNTER — Encounter (HOSPITAL_COMMUNITY): Payer: Self-pay | Admitting: *Deleted

## 2018-04-18 ENCOUNTER — Other Ambulatory Visit: Payer: Self-pay | Admitting: Neurosurgery

## 2018-04-18 ENCOUNTER — Inpatient Hospital Stay: Payer: Medicare HMO | Attending: Hematology

## 2018-04-18 DIAGNOSIS — I10 Essential (primary) hypertension: Secondary | ICD-10-CM | POA: Insufficient documentation

## 2018-04-18 DIAGNOSIS — K922 Gastrointestinal hemorrhage, unspecified: Secondary | ICD-10-CM | POA: Insufficient documentation

## 2018-04-18 DIAGNOSIS — J449 Chronic obstructive pulmonary disease, unspecified: Secondary | ICD-10-CM | POA: Diagnosis not present

## 2018-04-18 DIAGNOSIS — Z7984 Long term (current) use of oral hypoglycemic drugs: Secondary | ICD-10-CM | POA: Diagnosis not present

## 2018-04-18 DIAGNOSIS — Z79899 Other long term (current) drug therapy: Secondary | ICD-10-CM | POA: Insufficient documentation

## 2018-04-18 DIAGNOSIS — K219 Gastro-esophageal reflux disease without esophagitis: Secondary | ICD-10-CM | POA: Insufficient documentation

## 2018-04-18 DIAGNOSIS — D509 Iron deficiency anemia, unspecified: Secondary | ICD-10-CM

## 2018-04-18 DIAGNOSIS — E119 Type 2 diabetes mellitus without complications: Secondary | ICD-10-CM | POA: Diagnosis not present

## 2018-04-18 DIAGNOSIS — M069 Rheumatoid arthritis, unspecified: Secondary | ICD-10-CM | POA: Diagnosis not present

## 2018-04-18 DIAGNOSIS — Z87891 Personal history of nicotine dependence: Secondary | ICD-10-CM | POA: Insufficient documentation

## 2018-04-18 DIAGNOSIS — D500 Iron deficiency anemia secondary to blood loss (chronic): Secondary | ICD-10-CM | POA: Diagnosis not present

## 2018-04-18 DIAGNOSIS — E785 Hyperlipidemia, unspecified: Secondary | ICD-10-CM | POA: Diagnosis not present

## 2018-04-18 LAB — CBC WITH DIFFERENTIAL/PLATELET
BASOS ABS: 0 10*3/uL (ref 0.0–0.1)
BASOS PCT: 0 %
EOS PCT: 4 %
Eosinophils Absolute: 0.2 10*3/uL (ref 0.0–0.5)
HCT: 38.7 % (ref 34.8–46.6)
Hemoglobin: 12 g/dL (ref 11.6–15.9)
LYMPHS PCT: 34 %
Lymphs Abs: 1.8 10*3/uL (ref 0.9–3.3)
MCH: 30.8 pg (ref 25.1–34.0)
MCHC: 31 g/dL — ABNORMAL LOW (ref 31.5–36.0)
MCV: 99.5 fL (ref 79.5–101.0)
Monocytes Absolute: 0.4 10*3/uL (ref 0.1–0.9)
Monocytes Relative: 7 %
Neutro Abs: 2.9 10*3/uL (ref 1.5–6.5)
Neutrophils Relative %: 55 %
Platelets: 286 10*3/uL (ref 145–400)
RBC: 3.89 MIL/uL (ref 3.70–5.45)
RDW: 13.5 % (ref 11.2–14.5)
WBC: 5.2 10*3/uL (ref 3.9–10.3)

## 2018-04-18 LAB — IRON AND TIBC
Iron: 48 ug/dL (ref 41–142)
SATURATION RATIOS: 17 % — AB (ref 21–57)
TIBC: 289 ug/dL (ref 236–444)
UIBC: 241 ug/dL

## 2018-04-18 LAB — FERRITIN: Ferritin: 117 ng/mL (ref 9–269)

## 2018-04-18 MED ORDER — VANCOMYCIN HCL 10 G IV SOLR
1500.0000 mg | INTRAVENOUS | Status: AC
  Administered 2018-04-19: 1500 mg via INTRAVENOUS
  Filled 2018-04-18 (×2): qty 1500

## 2018-04-18 NOTE — Progress Notes (Addendum)
Spoke with pt for pre-op call. Pt has hx of palpitations, denies any other cardiac history or chest pain. Pt is a type 2 diabetic. Pt doesn't remember what her last A1C was. She states her fasting blood sugar is usually between 100-110. Instructed pt not to take her Metformin in the AM. Instructed her to check her blood sugar when she gets up in the AM and every 2 hours until she leaves for the hospital. If blood sugar is 70 or below, treat with 1/2 cup of clear juice (apple or cranberry) and recheck blood sugar 15 minutes after drinking juice. Pt is arriving via [employer]. Have requested most recent A1C result from Dr. Rubye Oaks office

## 2018-04-19 ENCOUNTER — Ambulatory Visit (HOSPITAL_COMMUNITY): Payer: Medicare HMO | Admitting: Certified Registered Nurse Anesthetist

## 2018-04-19 ENCOUNTER — Encounter (HOSPITAL_COMMUNITY)
Admission: RE | Disposition: A | Discharge status: Home / Self Care | Payer: Self-pay | Source: Ambulatory Visit | Attending: Neurosurgery

## 2018-04-19 ENCOUNTER — Encounter (HOSPITAL_COMMUNITY): Payer: Self-pay | Admitting: Critical Care Medicine

## 2018-04-19 ENCOUNTER — Ambulatory Visit (HOSPITAL_COMMUNITY)
Admission: RE | Admit: 2018-04-19 | Discharge: 2018-04-19 | Disposition: A | Discharge status: Home / Self Care | Payer: Medicare HMO | Source: Ambulatory Visit | Attending: Neurosurgery | Admitting: Neurosurgery

## 2018-04-19 DIAGNOSIS — E785 Hyperlipidemia, unspecified: Secondary | ICD-10-CM | POA: Diagnosis not present

## 2018-04-19 DIAGNOSIS — E119 Type 2 diabetes mellitus without complications: Secondary | ICD-10-CM | POA: Insufficient documentation

## 2018-04-19 DIAGNOSIS — K219 Gastro-esophageal reflux disease without esophagitis: Secondary | ICD-10-CM | POA: Diagnosis not present

## 2018-04-19 DIAGNOSIS — Z7984 Long term (current) use of oral hypoglycemic drugs: Secondary | ICD-10-CM | POA: Insufficient documentation

## 2018-04-19 DIAGNOSIS — Z79899 Other long term (current) drug therapy: Secondary | ICD-10-CM | POA: Diagnosis not present

## 2018-04-19 DIAGNOSIS — M069 Rheumatoid arthritis, unspecified: Secondary | ICD-10-CM | POA: Insufficient documentation

## 2018-04-19 DIAGNOSIS — Z9981 Dependence on supplemental oxygen: Secondary | ICD-10-CM | POA: Diagnosis not present

## 2018-04-19 DIAGNOSIS — G5602 Carpal tunnel syndrome, left upper limb: Secondary | ICD-10-CM | POA: Diagnosis not present

## 2018-04-19 DIAGNOSIS — I10 Essential (primary) hypertension: Secondary | ICD-10-CM | POA: Diagnosis not present

## 2018-04-19 DIAGNOSIS — J449 Chronic obstructive pulmonary disease, unspecified: Secondary | ICD-10-CM | POA: Diagnosis not present

## 2018-04-19 DIAGNOSIS — Z87891 Personal history of nicotine dependence: Secondary | ICD-10-CM | POA: Diagnosis not present

## 2018-04-19 DIAGNOSIS — Z6841 Body Mass Index (BMI) 40.0 and over, adult: Secondary | ICD-10-CM | POA: Diagnosis not present

## 2018-04-19 HISTORY — PX: CARPAL TUNNEL RELEASE: SHX101

## 2018-04-19 HISTORY — DX: Alcohol abuse, in remission: F10.11

## 2018-04-19 HISTORY — DX: Irritable bowel syndrome, unspecified: K58.9

## 2018-04-19 HISTORY — DX: Pneumonia, unspecified organism: J18.9

## 2018-04-19 LAB — GLUCOSE, CAPILLARY
GLUCOSE-CAPILLARY: 102 mg/dL — AB (ref 65–99)
Glucose-Capillary: 109 mg/dL — ABNORMAL HIGH (ref 65–99)

## 2018-04-19 LAB — BASIC METABOLIC PANEL
Anion gap: 9 (ref 5–15)
BUN: 16 mg/dL (ref 6–20)
CALCIUM: 9 mg/dL (ref 8.9–10.3)
CO2: 30 mmol/L (ref 22–32)
Chloride: 102 mmol/L (ref 101–111)
Creatinine, Ser: 1.23 mg/dL — ABNORMAL HIGH (ref 0.44–1.00)
GFR calc non Af Amer: 43 mL/min — ABNORMAL LOW (ref >60)
GFR, EST AFRICAN AMERICAN: 50 mL/min — AB (ref >60)
Glucose, Bld: 120 mg/dL — ABNORMAL HIGH (ref 65–99)
Potassium: 4.3 mmol/L (ref 3.5–5.1)
SODIUM: 141 mmol/L (ref 135–145)

## 2018-04-19 LAB — CBC
HCT: 39 % (ref 36.0–46.0)
Hemoglobin: 11.7 g/dL — ABNORMAL LOW (ref 12.0–15.0)
MCH: 30.6 pg (ref 26.0–34.0)
MCHC: 30 g/dL (ref 30.0–36.0)
MCV: 102.1 fL — ABNORMAL HIGH (ref 78.0–100.0)
PLATELETS: 285 10*3/uL (ref 150–400)
RBC: 3.82 MIL/uL — ABNORMAL LOW (ref 3.87–5.11)
RDW: 13.2 % (ref 11.5–15.5)
WBC: 5.5 10*3/uL (ref 4.0–10.5)

## 2018-04-19 SURGERY — CARPAL TUNNEL RELEASE
Anesthesia: Monitor Anesthesia Care | Laterality: Left

## 2018-04-19 MED ORDER — FENTANYL CITRATE (PF) 250 MCG/5ML IJ SOLN
INTRAMUSCULAR | Status: DC | PRN
  Administered 2018-04-19: 25 ug via INTRAVENOUS

## 2018-04-19 MED ORDER — BACITRACIN ZINC 500 UNIT/GM EX OINT
TOPICAL_OINTMENT | CUTANEOUS | Status: DC | PRN
  Administered 2018-04-19: 1 via TOPICAL

## 2018-04-19 MED ORDER — PROPOFOL 500 MG/50ML IV EMUL
INTRAVENOUS | Status: DC | PRN
  Administered 2018-04-19: 100 ug/kg/min via INTRAVENOUS

## 2018-04-19 MED ORDER — FENTANYL CITRATE (PF) 100 MCG/2ML IJ SOLN: 25.0000 ug | INTRAMUSCULAR | Status: DC | PRN

## 2018-04-19 MED ORDER — FENTANYL CITRATE (PF) 250 MCG/5ML IJ SOLN
INTRAMUSCULAR | Status: AC
Start: 2018-04-19 — End: ?
  Filled 2018-04-19: qty 5

## 2018-04-19 MED ORDER — LACTATED RINGERS IV SOLN
INTRAVENOUS | Status: DC | PRN
  Administered 2018-04-19: 10:00:00 via INTRAVENOUS

## 2018-04-19 MED ORDER — 0.9 % SODIUM CHLORIDE (POUR BTL) OPTIME
TOPICAL | Status: DC | PRN
Start: 2018-04-19 — End: 2018-04-19
  Administered 2018-04-19: 1000 mL

## 2018-04-19 MED ORDER — LIDOCAINE-EPINEPHRINE 0.5 %-1:200000 IJ SOLN
INTRAMUSCULAR | Status: AC
  Filled 2018-04-19: qty 1

## 2018-04-19 MED ORDER — CHLORHEXIDINE GLUCONATE CLOTH 2 % EX PADS: 6.0000 | MEDICATED_PAD | Freq: Once | CUTANEOUS | Status: DC

## 2018-04-19 MED ORDER — MIDAZOLAM HCL 2 MG/2ML IJ SOLN
INTRAMUSCULAR | Status: DC | PRN
  Administered 2018-04-19: 2 mg via INTRAVENOUS

## 2018-04-19 MED ORDER — BACITRACIN ZINC 500 UNIT/GM EX OINT
TOPICAL_OINTMENT | CUTANEOUS | Status: AC
  Filled 2018-04-19: qty 28.35

## 2018-04-19 MED ORDER — MIDAZOLAM HCL 2 MG/2ML IJ SOLN
INTRAMUSCULAR | Status: AC
  Filled 2018-04-19: qty 2

## 2018-04-19 MED ORDER — ACETAMINOPHEN-CODEINE #3 300-30 MG PO TABS: 1.0000 | ORAL_TABLET | Freq: Four times a day (QID) | ORAL | 0 refills | Status: DC | PRN

## 2018-04-19 MED ORDER — LIDOCAINE-EPINEPHRINE 0.5 %-1:200000 IJ SOLN
INTRAMUSCULAR | Status: DC | PRN
  Administered 2018-04-19: 5 mL

## 2018-04-19 SURGICAL SUPPLY — 32 items
BANDAGE ACE 3X5.8 VEL STRL LF (GAUZE/BANDAGES/DRESSINGS) ×2 IMPLANT
BLADE SURG 15 STRL LF DISP TIS (BLADE) ×1 IMPLANT
BLADE SURG 15 STRL SS (BLADE) ×1
BNDG GAUZE ELAST 4 BULKY (GAUZE/BANDAGES/DRESSINGS) ×2 IMPLANT
CABLE BIPOLOR RESECTION CORD (MISCELLANEOUS) ×2 IMPLANT
DECANTER SPIKE VIAL GLASS SM (MISCELLANEOUS) ×2 IMPLANT
DRAPE EXTREMITY T 121X128X90 (DRAPE) ×2 IMPLANT
DRAPE HALF SHEET 40X57 (DRAPES) ×2 IMPLANT
DURAPREP 26ML APPLICATOR (WOUND CARE) ×2 IMPLANT
GAUZE SPONGE 4X4 12PLY STRL (GAUZE/BANDAGES/DRESSINGS) ×2 IMPLANT
GAUZE SPONGE 4X4 16PLY XRAY LF (GAUZE/BANDAGES/DRESSINGS) ×2 IMPLANT
GLOVE ECLIPSE 6.5 STRL STRAW (GLOVE) ×2 IMPLANT
GOWN STRL REUS W/ TWL LRG LVL3 (GOWN DISPOSABLE) ×2 IMPLANT
GOWN STRL REUS W/ TWL XL LVL3 (GOWN DISPOSABLE) IMPLANT
GOWN STRL REUS W/TWL 2XL LVL3 (GOWN DISPOSABLE) IMPLANT
GOWN STRL REUS W/TWL LRG LVL3 (GOWN DISPOSABLE) ×2
GOWN STRL REUS W/TWL XL LVL3 (GOWN DISPOSABLE)
KIT BASIN OR (CUSTOM PROCEDURE TRAY) ×2 IMPLANT
KIT TURNOVER KIT B (KITS) ×2 IMPLANT
NEEDLE HYPO 25X1 1.5 SAFETY (NEEDLE) ×2 IMPLANT
NS IRRIG 1000ML POUR BTL (IV SOLUTION) ×2 IMPLANT
PACK SURGICAL SETUP 50X90 (CUSTOM PROCEDURE TRAY) ×2 IMPLANT
PAD ARMBOARD 7.5X6 YLW CONV (MISCELLANEOUS) ×6 IMPLANT
STOCKINETTE 4X48 STRL (DRAPES) ×2 IMPLANT
SUT ETHILON 3 0 PS 1 (SUTURE) ×2 IMPLANT
SYR BULB 3OZ (MISCELLANEOUS) ×2 IMPLANT
SYR CONTROL 10ML LL (SYRINGE) ×2 IMPLANT
TOWEL GREEN STERILE (TOWEL DISPOSABLE) ×2 IMPLANT
TOWEL GREEN STERILE FF (TOWEL DISPOSABLE) ×2 IMPLANT
TUBE CONNECTING 12X1/4 (SUCTIONS) ×2 IMPLANT
UNDERPAD 30X30 (UNDERPADS AND DIAPERS) ×2 IMPLANT
WATER STERILE IRR 1000ML POUR (IV SOLUTION) ×2 IMPLANT

## 2018-04-19 NOTE — Anesthesia Preprocedure Evaluation (Signed)
Anesthesia Evaluation  Patient identified by MRN, date of birth, ID band Patient awake    Reviewed: Allergy & Precautions, NPO status , Patient's Chart, lab work & pertinent test results, reviewed documented beta blocker date and time   History of Anesthesia Complications Negative for: history of anesthetic complications  Airway Mallampati: I  TM Distance: >3 FB Neck ROM: Full    Dental  (+) Edentulous Upper, Missing   Pulmonary asthma , sleep apnea , COPD,  COPD inhaler and oxygen dependent, former smoker,    breath sounds clear to auscultation       Cardiovascular hypertension, Pt. on medications and Pt. on home beta blockers (-) angina Rhythm:Regular     Neuro/Psych negative neurological ROS  negative psych ROS   GI/Hepatic GERD  Medicated and Controlled,(+) Hepatitis -  Endo/Other  diabetes, Type 2Morbid obesity  Renal/GU CRFRenal disease     Musculoskeletal  (+) Arthritis ,   Abdominal   Peds  Hematology  (+) anemia ,   Anesthesia Other Findings  Nuclear stress EF: 66%.  There was no ST segment deviation noted during stress.  This is a low risk study.  The left ventricular ejection fraction is hyperdynamic (>65%).   1. EF 66%, normal wall motion.  2. Fixed, small, mild mid anterior perfusion defect.  Given normal wall motion, suspect soft tissue attenuation rather than infarction.  No ischemia.  3. Low risk study.   Reproductive/Obstetrics                             Anesthesia Physical Anesthesia Plan  ASA: III  Anesthesia Plan: MAC   Post-op Pain Management:    Induction:   PONV Risk Score and Plan: 2 and Treatment may vary due to age or medical condition  Airway Management Planned: Nasal Cannula  Additional Equipment:   Intra-op Plan:   Post-operative Plan:   Informed Consent: I have reviewed the patients History and Physical, chart, labs and discussed the  procedure including the risks, benefits and alternatives for the proposed anesthesia with the patient or authorized representative who has indicated his/her understanding and acceptance.   Dental advisory given  Plan Discussed with: CRNA and Surgeon  Anesthesia Plan Comments:         Anesthesia Quick Evaluation

## 2018-04-19 NOTE — Op Note (Signed)
   11:52 AM  PATIENT:  Anita Gonzalez  72 y.o. female with left carpal tunnel syndrome  PRE-OPERATIVE DIAGNOSIS:  Left Carpal Tunnel Syndrome  POST-OPERATIVE DIAGNOSIS:  Left Carpal Tunnel Syndrome  PROCEDURE:  Procedure(s): CARPAL TUNNEL RELEASE  SURGEON: Surgeon(s): Coletta Memos, MD  ANESTHESIA:   local  EBL:  Total I/O In: 300 [I.V.:300] Out: 5 [Blood:5]  COUNT:per nursing  DICTATION: Anita Gonzalez was taken to the operating room, given IV sedation, and positioned on the operating room table. female had their left upper extremity prepped and draped in a sterile manner. I infiltrated Licodcaine  1/2% 1/200,000 strength epinephrine into the planned incision starting at the proximal palmar crease extending into the hand ~ 1.5cm. I opened the skin with a 15 blade and extended the incision through the skin into the subcutaneous tissue. I used the bipolar cautery to control the subcutaneous bleeding. I dissected sharply through the tissue using forceps also to expose the transverse carpal ligament. I divided the transverse carpal ligament sharply with the 15 blade using the forceps to protect the contents of the carpal tunnel. With the scissors I divided the ligament both proximally and distally to decompress the entire carpal tunnel. I used the scissors to dissect into the forearm to create space to divide the ligament to the proximal palmar crease, and distally into the palm.  I irrigated the wound then closed the incision with vertical interrupted vertical mattress sutures. I placed a sterile dressing, then wrapped the proximal hand and distal forearm with an ace wrap.  PLAN OF CARE: Discharge to home after PACU  PATIENT DISPOSITION:  PACU - hemodynamically stable.   Delay start of Pharmacological VTE agent (>24hrs) due to surgical blood loss or risk of bleeding:  yes

## 2018-04-19 NOTE — H&P (Signed)
There were no vitals taken for this visit. Anita Gonzalez comes in today at the behest of Dr. Oleh Genin. She is sent here today for treatment of carpal tunnel syndrome in the left hand, which was deemed to be moderate to severe. She had undergone an EMG which was performed on December 24, 2016. PAST MEDICAL HISTORY: Includes anemia, arthritis, bronchitis, chronic obstructive pulmonary disease, diabetes, gastroesophageal reflux disease, hypertension, rheumatoid arthritis. She has had a cesarean section. She has had a herniorrhaphy and hemorrhoid surgery. ALLERGIES: She is allergic to Penicillin, Hydrocodone, Oxycodone. SOCIAL HISTORY: She is retired, 72 years of age. She is a former smoker. She is left handed. No history of substance abuse. She does drink alcohol socially. MEDICATIONS: Acetaminophen, Tylenol No. 3, Abuterol, Atorvastatin, Bumetanide, Clobetasol, Cyanocobalamin, Diclofenac, Diphenoxylate Atropine, Ergocalciferol, Ezetimibe, Ferrous Sulfate, Fluticasone, Folic Acid, Ipratropium Albuterol, Irbesartan, Metformin, Metoprolol, Pantoprazole, Potassium Chloride, Senna, Symbicort, Verapamil. REVIEW OF SYSTEMS: Positive for night sweats, hearing loss, tinnitus, balance problems, sinus problems, chronic cough, shortness of breath, swelling in the feet and hands, leg pain with walking, peptic ulcer disease, liver disease, difficulty with urinary stream, arthritis, leg weakness, back pain, leg pain, joint pain, problems with coordination, anemia, inhalant allergies. She says that the numbness is more frequent and noticeable than tingling. It is in the left hand, middle fingers. This has been ongoing for 1-1/2 years. She has had some weight gain. She says she still has problems with all the items that I mentioned in the review of systems. The EMG shows left carpal tunnel syndrome. EXAM: She has full strength in both hands and in the upper and lower extremities. Gait is normal. Muscle tone, bulk,  coordination normal. Pupils equal, round, react to light. Full extraocular movements. Full visual fields. Hearing is intact to voice. She has the following vitals 60 inches in height, weighs 229 pounds, BMI is 44.76, blood pressure is 149/75, pulse is 82, temperature is 98.2. ASSESSMENT AND PLAN: Anita Gonzalez and I discussed the carpal tunnel syndrome. I do believe the most effective form of treatment is open surgery and release and transection of the transverse carpal ligament. She is in agreement with this and we will go ahead and get her scheduled for left carpal tunnel release.

## 2018-04-19 NOTE — Anesthesia Procedure Notes (Signed)
Procedure Name: MAC Date/Time: 04/19/2018 11:00 AM Performed by: Wilburn Cornelia, CRNA Pre-anesthesia Checklist: Patient identified, Emergency Drugs available, Suction available, Patient being monitored and Timeout performed Oxygen Delivery Method: Simple face mask Placement Confirmation: positive ETCO2 and breath sounds checked- equal and bilateral Dental Injury: Teeth and Oropharynx as per pre-operative assessment

## 2018-04-19 NOTE — Transfer of Care (Signed)
Immediate Anesthesia Transfer of Care Note  Patient: Anita Gonzalez  Procedure(s) Performed: CARPAL TUNNEL RELEASE (Left )  Patient Location: PACU  Anesthesia Type:General  Level of Consciousness: awake, alert , oriented and patient cooperative  Airway & Oxygen Therapy: Patient Spontanous Breathing and Patient connected to nasal cannula oxygen  Post-op Assessment: Report given to RN and Post -op Vital signs reviewed and stable  Post vital signs: Reviewed and stable  Last Vitals:  Vitals Value Taken Time  BP    Temp    Pulse 43 04/19/2018 11:35 AM  Resp 19 04/19/2018 11:35 AM  SpO2 100 % 04/19/2018 11:35 AM  Vitals shown include unvalidated device data.  Last Pain:  Vitals:   04/19/18 0950  TempSrc:   PainSc: 0-No pain      Patients Stated Pain Goal: 3 (04/19/18 0950)  Complications: No apparent anesthesia complications

## 2018-04-19 NOTE — Progress Notes (Signed)
Orthopedic Tech Progress Note Patient Details:  [name] [facility] [birth year] [id number]  Ortho Devices Type of Ortho Device: Arm sling Ortho Device/Splint Interventions: Application   Post Interventions Patient Tolerated: Well Instructions Provided: Care of device   Saul Fordyce 04/19/2018, 1:55 PM

## 2018-04-19 NOTE — Anesthesia Postprocedure Evaluation (Signed)
Anesthesia Post Note  Patient: Anita Gonzalez  Procedure(s) Performed: CARPAL TUNNEL RELEASE (Left )     Patient location during evaluation: PACU Anesthesia Type: MAC Level of consciousness: awake and alert Pain management: pain level controlled Vital Signs Assessment: post-procedure vital signs reviewed and stable Respiratory status: spontaneous breathing, nonlabored ventilation, respiratory function stable and patient connected to nasal cannula oxygen Cardiovascular status: stable and blood pressure returned to baseline Postop Assessment: no apparent nausea or vomiting Anesthetic complications: no    Last Vitals:  Vitals:   04/19/18 1257 04/19/18 1333  BP: (!) 102/53 (!) 135/54  Pulse: (!) 50 (!) 50  Resp: 20 18  Temp:  (!) 36.4 C  SpO2: 95% 95%    Last Pain:  Vitals:   04/19/18 1205  TempSrc:   PainSc: 2                  Lundy Cozart

## 2018-04-19 NOTE — Discharge Instructions (Addendum)

## 2018-04-20 ENCOUNTER — Encounter (HOSPITAL_COMMUNITY): Payer: Self-pay | Admitting: Neurosurgery

## 2018-04-22 ENCOUNTER — Telehealth: Payer: Self-pay | Admitting: Nurse Practitioner

## 2018-04-22 NOTE — Telephone Encounter (Addendum)
Notified patient of the message below and encouraged her to continue oral iron at current BID dose. she tolerates well. She verbalizes understanding.  Clayborn Heron, NP ----- Message from Malachy Mood, MD sent at 04/21/2018  9:07 PM EDT ----- Please let pt know her lab results, iron level adequate, no need iv iron, thanks  Malachy Mood 04/21/2018

## 2018-04-25 DIAGNOSIS — M545 Low back pain: Secondary | ICD-10-CM | POA: Diagnosis not present

## 2018-04-25 DIAGNOSIS — M25561 Pain in right knee: Secondary | ICD-10-CM | POA: Diagnosis not present

## 2018-04-25 DIAGNOSIS — M1712 Unilateral primary osteoarthritis, left knee: Secondary | ICD-10-CM | POA: Diagnosis not present

## 2018-04-25 DIAGNOSIS — M1711 Unilateral primary osteoarthritis, right knee: Secondary | ICD-10-CM | POA: Diagnosis not present

## 2018-04-25 DIAGNOSIS — M25562 Pain in left knee: Secondary | ICD-10-CM | POA: Diagnosis not present

## 2018-04-30 ENCOUNTER — Telehealth: Payer: Self-pay | Admitting: Internal Medicine

## 2018-04-30 DIAGNOSIS — L03032 Cellulitis of left toe: Secondary | ICD-10-CM | POA: Diagnosis not present

## 2018-04-30 DIAGNOSIS — M79675 Pain in left toe(s): Secondary | ICD-10-CM | POA: Diagnosis not present

## 2018-04-30 NOTE — Telephone Encounter (Signed)
Spoke with patient and will call her 6/19 to set up a surgical clearance appt.

## 2018-04-30 NOTE — Telephone Encounter (Signed)
New message:      Pt is calling to get an appt to get a surgical clearance to have two toenails removed. Dr requested for her to get her blood flow checked

## 2018-05-01 ENCOUNTER — Other Ambulatory Visit: Payer: Self-pay | Admitting: Internal Medicine

## 2018-05-01 NOTE — Telephone Encounter (Signed)
Spoke with patient and set up an appt for surgical clearance.

## 2018-05-05 DIAGNOSIS — R0902 Hypoxemia: Secondary | ICD-10-CM | POA: Diagnosis not present

## 2018-05-05 DIAGNOSIS — J449 Chronic obstructive pulmonary disease, unspecified: Secondary | ICD-10-CM | POA: Diagnosis not present

## 2018-05-05 DIAGNOSIS — Z8739 Personal history of other diseases of the musculoskeletal system and connective tissue: Secondary | ICD-10-CM | POA: Diagnosis not present

## 2018-05-05 DIAGNOSIS — Z8709 Personal history of other diseases of the respiratory system: Secondary | ICD-10-CM | POA: Diagnosis not present

## 2018-05-07 ENCOUNTER — Ambulatory Visit: Payer: Medicare HMO | Admitting: Cardiovascular Disease

## 2018-05-07 ENCOUNTER — Other Ambulatory Visit: Payer: Self-pay | Admitting: Internal Medicine

## 2018-05-08 DIAGNOSIS — I10 Essential (primary) hypertension: Secondary | ICD-10-CM | POA: Diagnosis not present

## 2018-05-08 DIAGNOSIS — Z0110 Encounter for examination of ears and hearing without abnormal findings: Secondary | ICD-10-CM | POA: Diagnosis not present

## 2018-05-08 DIAGNOSIS — Z0101 Encounter for examination of eyes and vision with abnormal findings: Secondary | ICD-10-CM | POA: Diagnosis not present

## 2018-05-08 DIAGNOSIS — E119 Type 2 diabetes mellitus without complications: Secondary | ICD-10-CM | POA: Diagnosis not present

## 2018-05-08 DIAGNOSIS — E785 Hyperlipidemia, unspecified: Secondary | ICD-10-CM | POA: Diagnosis not present

## 2018-05-08 DIAGNOSIS — R1013 Epigastric pain: Secondary | ICD-10-CM | POA: Diagnosis not present

## 2018-05-08 DIAGNOSIS — B1920 Unspecified viral hepatitis C without hepatic coma: Secondary | ICD-10-CM | POA: Diagnosis not present

## 2018-05-08 DIAGNOSIS — H538 Other visual disturbances: Secondary | ICD-10-CM | POA: Diagnosis not present

## 2018-05-08 DIAGNOSIS — G8929 Other chronic pain: Secondary | ICD-10-CM | POA: Diagnosis not present

## 2018-05-08 DIAGNOSIS — Z0000 Encounter for general adult medical examination without abnormal findings: Secondary | ICD-10-CM | POA: Diagnosis not present

## 2018-05-09 DIAGNOSIS — K740 Hepatic fibrosis: Secondary | ICD-10-CM | POA: Diagnosis not present

## 2018-05-09 DIAGNOSIS — Z6841 Body Mass Index (BMI) 40.0 and over, adult: Secondary | ICD-10-CM | POA: Diagnosis not present

## 2018-05-11 DIAGNOSIS — J45909 Unspecified asthma, uncomplicated: Secondary | ICD-10-CM | POA: Diagnosis not present

## 2018-05-11 DIAGNOSIS — J9600 Acute respiratory failure, unspecified whether with hypoxia or hypercapnia: Secondary | ICD-10-CM | POA: Diagnosis not present

## 2018-05-11 DIAGNOSIS — D509 Iron deficiency anemia, unspecified: Secondary | ICD-10-CM | POA: Diagnosis not present

## 2018-05-11 DIAGNOSIS — J189 Pneumonia, unspecified organism: Secondary | ICD-10-CM | POA: Diagnosis not present

## 2018-05-17 NOTE — Progress Notes (Signed)
Cardiology Office Note   Date:  05/20/2018   ID:  Anita Gonzalez, DOB [birth year], MRN [id number]  PCP:  [name], MD  Cardiologist:  Dr. Tenny Craw    Chief Complaint  Patient presents with  . Pre-op Exam    ? blood flow per podiatrist      History of Present Illness: Anita Gonzalez is a 72 y.o. female who presents for pre-op clearance -- her toenails to be removed.  They were concerned about her blood flow to legs.  She actually had appt with Dr. Allyson Sabal tomorrow for this, but she called and had this appt arranged.    She has a history of CAD on CT scan, HTN, HL, DM --she has had 01/30/17 myovue that was Normal  Today she has no chest pain but she has increased SOB with exertion.  Using her inhalers more which has helped.  She believes this is in part due to the heat.   She has some numbness in bottom of her feet, no pain so much with walking, but I could not palpate pulses.    Today she is also in a Junctional rhythm at rate of 52, PR may be > 400 ms.  She is on torpol SL 25 mg and verapamil 180 mg BID this has controlled palpitations.   She has a RBBB as well.      Past Medical History:  Diagnosis Date  . Acute kidney injury (HCC) 01/11/2015  . Alcohol abuse, in remission   . Anemia   . Anemia, iron deficiency 01/28/2015  . Arthritis   . Asthma   . Bronchiectasis (HCC) 03/07/2016  . Bronchitis   . COPD (chronic obstructive pulmonary disease) (HCC)   . Diabetes mellitus without complication (HCC)    type 2  . DM II (diabetes mellitus, type II), controlled (HCC) 01/06/2015  . Dyspnea and respiratory abnormality 04/06/2015  . Dysrhythmia    hx of palpitations  . E-coli UTI 01/11/2015  . Essential hypertension 01/06/2015  . GERD (gastroesophageal reflux disease)   . Hay fever   . Hepatitis    c treated with pills  . HLD (hyperlipidemia) 01/06/2015  . Hypertension   . IBS (irritable bowel syndrome)   . ILD (interstitial lung disease) (HCC) 12/11/2016  . Leg cramps, sleep  related 12/01/2015  . Pneumonia   . Rheumatoid arthritis (HCC) 1996  . Sinus tachycardia 01/11/2015    Past Surgical History:  Procedure Laterality Date  . ABDOMINAL HYSTERECTOMY    . BALLOON DILATION N/A 03/15/2018   Procedure: BALLOON DILATION;  Surgeon: Jeani Hawking, MD;  Location: [facility];  Service: Endoscopy;  Laterality: N/A;  . CARPAL TUNNEL RELEASE Left 04/19/2018   Procedure: CARPAL TUNNEL RELEASE;  Surgeon: Coletta Memos, MD;  Location: [facility] OR;  Service: Neurosurgery;  Laterality: Left;  CARPAL TUNNEL RELEASE  . CESAREAN SECTION    . COLONOSCOPY WITH PROPOFOL N/A 04/02/2015   Procedure: COLONOSCOPY WITH PROPOFOL;  Surgeon: Jeani Hawking, MD;  Location: [facility];  Service: Endoscopy;  Laterality: N/A;  . ESOPHAGOGASTRODUODENOSCOPY (EGD) WITH PROPOFOL N/A 04/02/2015   Procedure: ESOPHAGOGASTRODUODENOSCOPY (EGD) WITH PROPOFOL;  Surgeon: Jeani Hawking, MD;  Location: [facility];  Service: Endoscopy;  Laterality: N/A;  . ESOPHAGOGASTRODUODENOSCOPY (EGD) WITH PROPOFOL N/A 03/15/2018   Procedure: ESOPHAGOGASTRODUODENOSCOPY (EGD) WITH PROPOFOL;  Surgeon: Jeani Hawking, MD;  Location: [facility];  Service: Endoscopy;  Laterality: N/A;  . HEMORRHOID SURGERY    . HERNIA REPAIR    . HERNIA REPAIR  Current Outpatient Medications  Medication Sig Dispense Refill  . acetaminophen (TYLENOL) 500 MG tablet Take 500 mg by mouth 2 (two) times daily.     Marland Kitchen acetaminophen-codeine (TYLENOL #3) 300-30 MG tablet Take 1 tablet by mouth every 6 (six) hours as needed for moderate pain. 30 tablet 0  . albuterol (PROVENTIL HFA;VENTOLIN HFA) 108 (90 BASE) MCG/ACT inhaler Inhale 2 puffs into the lungs every 6 (six) hours as needed for wheezing or shortness of breath (wheezing).     Marland Kitchen atorvastatin (LIPITOR) 40 MG tablet Take 1 tablet (40 mg total) by mouth daily. Please keep appt for future refills thank you. 90 tablet 0  . bumetanide (BUMEX) 2 MG tablet Take 0.5 mg by mouth every other day.     .  cyanocobalamin 1000 MCG tablet Take 1,000 mcg by mouth every Thursday.     . cyclobenzaprine (FLEXERIL) 10 MG tablet Take 10 mg by mouth at bedtime.     . diclofenac sodium (VOLTAREN) 1 % GEL Apply 1 application topically 4 (four) times daily as needed (for pain).     Marland Kitchen docusate sodium (COLACE) 100 MG capsule Take 100 mg by mouth 2 (two) times daily as needed for mild constipation.    . ergocalciferol (VITAMIN D2) 50000 UNITS capsule Take 50,000 Units by mouth every Monday.     . ezetimibe (ZETIA) 10 MG tablet Take 1 tablet (10 mg total) by mouth daily. Please keep upcoming appt with Dr. Tenny Craw. Thank you. 30 tablet 0  . ferrous sulfate 325 (65 FE) MG tablet Take 325 mg by mouth 2 (two) times daily with a meal.     . fluticasone (FLONASE) 50 MCG/ACT nasal spray Place 1 spray into both nostrils daily as needed for allergies or rhinitis.    . folic acid (FOLVITE) 1 MG tablet Take 1 tablet (1 mg total) by mouth daily. Please schedule an appointment for more refills, thanks! [phone]. 1st attempt 30 tablet 0  . ipratropium-albuterol (DUONEB) 0.5-2.5 (3) MG/3ML SOLN Inhale 3 mLs into the lungs every 6 (six) hours as needed (for shortness of breath).     . losartan (COZAAR) 50 MG tablet Take 50 mg by mouth daily.  2  . metFORMIN (GLUCOPHAGE) 500 MG tablet Take 500 mg by mouth 2 (two) times daily.    . montelukast (SINGULAIR) 10 MG tablet Take 10 mg by mouth at bedtime.    . pantoprazole (PROTONIX) 40 MG tablet Take 40 mg by mouth daily.     . potassium chloride (K-DUR,KLOR-CON) 10 MEQ tablet Take 10 mEq by mouth every other day.     . SYMBICORT 160-4.5 MCG/ACT inhaler INHALE 2 PUFFS BY MOUTH INTO THE LUNG 2 TIMES A DAY 30.6 g 1  . verapamil (VERELAN PM) 180 MG 24 hr capsule Take 180 mg by mouth 2 (two) times daily.  3   No current facility-administered medications for this visit.     Allergies:   Fish allergy; Peanuts [peanut oil]; Penicillins; Iodinated diagnostic agents; Iodine; Percocet  [oxycodone-acetaminophen]; Other; Sulfa antibiotics; Hydrocodone; and Oxycodone    Social History:  The patient  reports that she quit smoking about 7 years ago. Her smoking use included cigarettes. She has a 18.75 pack-year smoking history. She has never used smokeless tobacco. She reports that she drinks about 2.4 oz of alcohol per week. She reports that she does not use drugs.   Family History:  The patient's family history includes Asthma in her other; COPD in her other; Diabetes in her  mother; Heart disease in her other; Heart failure in her mother; Hypertension in her other; Stroke in her other; Throat cancer in her brother.    ROS:  General:no colds or fevers, no weight changes Skin:no rashes or ulcers HEENT:no blurred vision, no congestion CV:see HPI PUL:see HPI GI:no diarrhea constipation or melena, no indigestion GU:no hematuria, no dysuria MS:no joint pain, no claudication Neuro:no syncope, no lightheadedness Endo:+ diabetes, no thyroid disease  Wt Readings from Last 3 Encounters:  05/20/18 230 lb (104.3 kg)  04/19/18 229 lb (103.9 kg)  03/15/18 229 lb (103.9 kg)     PHYSICAL EXAM: VS:  BP 138/70   Pulse (!) 51   Ht 5\' 1"  (1.549 m)   Wt 230 lb (104.3 kg)   SpO2 93%   BMI 43.46 kg/m  , BMI Body mass index is 43.46 kg/m. General:Pleasant affect, NAD Skin:Warm and dry, brisk capillary refill HEENT:normocephalic, sclera clear, mucus membranes moist Neck:supple, no JVD, no bruits  Heart:S1S2 RRR without murmur, gallup, rub or click Lungs:clear without rales, rhonchi, or wheezes , non tender, + BS, do not palpate liver spleen or masses Ext:no lower ext edema, 2+ pedal pulses, 2+ radial pulses Neuro:alert and oriented, MAE, follows commands, + facial symmetry    EKG:  EKG is ordered today. The ekg ordered today demonstrates Junctional rhythm with PR >400 ms.  RBBB. Only change is slower rate   Recent Labs: 07/13/2017: ALT 13 04/19/2018: BUN 16; Creatinine,  Ser 1.23; Hemoglobin 11.7; Platelets 285; Potassium 4.3; Sodium 141    Lipid Panel    Component Value Date/Time   CHOL 161 02/23/2017 1642   TRIG 106 02/23/2017 1642   HDL 48 02/23/2017 1642   CHOLHDL 3.4 02/23/2017 1642   CHOLHDL 3 05/07/2015 1219   VLDL 15.8 05/07/2015 1219   LDLCALC 92 02/23/2017 1642       Other studies Reviewed: Additional studies/ records that were reviewed today include: . Stress test nuc 01/31/17 Study Highlights     Nuclear stress EF: 66%.  There was no ST segment deviation noted during stress.  This is a low risk study.  The left ventricular ejection fraction is hyperdynamic (>65%).   1. EF 66%, normal wall motion.  2. Fixed, small, mild mid anterior perfusion defect.  Given normal wall motion, suspect soft tissue attenuation rather than infarction.  No ischemia.  3. Low risk study.     Holter 07/2016 with SR.   ASSESSMENT AND PLAN:  1.  Pre-op exam  Once HR is improved she could proceed with toenail removal and her ABIs.  I discussed with Dr. 08/2016 and she will see post ABIs for final clearance.    2.  Junctional rhythm, discussed with Dr. Allyson Sabal and we stopped BB.  I warned pt she may feel palpitations -- she will let Clifton James know.  I will have her come back next week for EKG to re-eval HR.    3.  DOE may be from COPD but may be HR not meeting demand.  Will see how she is next week.        She walks with a walker so could not do ETT.  4.   HTN controled.  5.   HLD continue statin. Per PCP     Current medicines are reviewed with the patient today.  The patient Has no concerns regarding medicines.  The following changes have been made:  See above Labs/ tests ordered today include:see above  Disposition:   FU:  see above  Signed, Nada Boozer, NP  05/20/2018 4:30 PM    [facility] [address] [place], [place], [place]  [address]/ [address] [place], [place] Phone: [phone]; Fax: [phone]  [phone]  [phone]

## 2018-05-20 ENCOUNTER — Ambulatory Visit (INDEPENDENT_AMBULATORY_CARE_PROVIDER_SITE_OTHER): Payer: Medicare HMO | Admitting: Cardiology

## 2018-05-20 ENCOUNTER — Encounter: Payer: Self-pay | Admitting: Cardiology

## 2018-05-20 ENCOUNTER — Encounter (INDEPENDENT_AMBULATORY_CARE_PROVIDER_SITE_OTHER): Payer: Self-pay

## 2018-05-20 VITALS — BP 138/70 | HR 51 | Ht 61.0 in | Wt 230.0 lb

## 2018-05-20 DIAGNOSIS — Z01818 Encounter for other preprocedural examination: Secondary | ICD-10-CM | POA: Diagnosis not present

## 2018-05-20 DIAGNOSIS — R0602 Shortness of breath: Secondary | ICD-10-CM | POA: Diagnosis not present

## 2018-05-20 DIAGNOSIS — I739 Peripheral vascular disease, unspecified: Secondary | ICD-10-CM | POA: Diagnosis not present

## 2018-05-20 DIAGNOSIS — R002 Palpitations: Secondary | ICD-10-CM

## 2018-05-20 DIAGNOSIS — I498 Other specified cardiac arrhythmias: Secondary | ICD-10-CM | POA: Diagnosis not present

## 2018-05-20 DIAGNOSIS — R0609 Other forms of dyspnea: Secondary | ICD-10-CM

## 2018-05-20 DIAGNOSIS — R0600 Dyspnea, unspecified: Secondary | ICD-10-CM

## 2018-05-20 NOTE — Patient Instructions (Signed)
Medication Instructions:  1. STOP METOPROLOL 25 MG.   Labwork: NONE ORDERED   Testing/Procedures: Your physician has requested that you have a lower extremity arterial duplex. This test is an ultrasound of the arteries in the legs or arms. It looks at arterial blood flow in the legs and arms. Allow one hour for Lower and Upper Arterial scans. There are no restrictions or special instructions.  Your physician has requested that you have an ankle brachial index (ABI). During this test an ultrasound and blood pressure cuff are used to evaluate the arteries that supply the arms and legs with blood. Allow thirty minutes for this exam. There are no restrictions or special instructions.    Follow-Up: 1. Your physician recommends that you schedule a follow-up appointment in: 1 WEEK FOR EKG TO BE DONE WITH NURSE IN TRIAGE.  2. Your physician recommends that you schedule a follow-up appointment WITH DR. Allyson Sabal AFTER TESTING IS DONE.  3. Your physician recommends that you schedule a follow-up appointment in: 6 MONTHS WITH DR. Tenny Craw.       Any Other Special Instructions Will Be Listed Below (If Applicable).     If you need a refill on your cardiac medications before your next appointment, please call your pharmacy.

## 2018-05-21 ENCOUNTER — Ambulatory Visit: Payer: Medicare HMO | Admitting: Cardiovascular Disease

## 2018-05-23 ENCOUNTER — Telehealth: Payer: Self-pay | Admitting: Hematology

## 2018-05-23 ENCOUNTER — Inpatient Hospital Stay: Payer: Medicare HMO

## 2018-05-23 NOTE — Telephone Encounter (Signed)
Returned patients call to r/s upcoming July appts. Because she is sick.

## 2018-05-24 ENCOUNTER — Encounter (HOSPITAL_COMMUNITY): Payer: Medicare HMO

## 2018-05-24 ENCOUNTER — Ambulatory Visit (HOSPITAL_COMMUNITY)
Admission: RE | Admit: 2018-05-24 | Discharge: 2018-05-24 | Disposition: A | Discharge status: Home / Self Care | Payer: Medicare HMO | Source: Ambulatory Visit | Attending: Cardiology | Admitting: Cardiology

## 2018-05-24 DIAGNOSIS — I739 Peripheral vascular disease, unspecified: Secondary | ICD-10-CM | POA: Diagnosis not present

## 2018-05-27 ENCOUNTER — Telehealth: Payer: Self-pay

## 2018-05-27 DIAGNOSIS — I739 Peripheral vascular disease, unspecified: Secondary | ICD-10-CM

## 2018-05-27 NOTE — Telephone Encounter (Signed)
-----   Message from Berle Mull sent at 05/27/2018  8:49 AM EDT ----- Elita Quick  We will need a referral put in the system for a PV consult.    Thanks  Carney Bern ----- Message ----- From: Ethelda Chick, RN Sent: 05/27/2018   8:44 AM To: [facility]  Hello Ladies,  Can you please call and schedule this patient with Dr. Allyson Sabal as new patient for PV, and will need EKG, per Nada Boozer NP?  Thank you,  Pam

## 2018-05-28 ENCOUNTER — Ambulatory Visit: Payer: Medicare HMO

## 2018-05-29 ENCOUNTER — Inpatient Hospital Stay: Payer: Medicare HMO | Attending: Hematology

## 2018-05-29 DIAGNOSIS — D500 Iron deficiency anemia secondary to blood loss (chronic): Secondary | ICD-10-CM | POA: Insufficient documentation

## 2018-05-29 DIAGNOSIS — D509 Iron deficiency anemia, unspecified: Secondary | ICD-10-CM

## 2018-05-29 LAB — CBC WITH DIFFERENTIAL/PLATELET
BASOS PCT: 0 %
Basophils Absolute: 0 10*3/uL (ref 0.0–0.1)
EOS ABS: 0.2 10*3/uL (ref 0.0–0.5)
Eosinophils Relative: 4 %
HCT: 37.1 % (ref 34.8–46.6)
HEMOGLOBIN: 12 g/dL (ref 11.6–15.9)
Lymphocytes Relative: 28 %
Lymphs Abs: 1.3 10*3/uL (ref 0.9–3.3)
MCH: 31.1 pg (ref 25.1–34.0)
MCHC: 32.3 g/dL (ref 31.5–36.0)
MCV: 96.2 fL (ref 79.5–101.0)
Monocytes Absolute: 0.5 10*3/uL (ref 0.1–0.9)
Monocytes Relative: 10 %
NEUTROS PCT: 58 %
Neutro Abs: 2.8 10*3/uL (ref 1.5–6.5)
Platelets: 228 10*3/uL (ref 145–400)
RBC: 3.86 MIL/uL (ref 3.70–5.45)
RDW: 14.3 % (ref 11.2–14.5)
WBC: 4.8 10*3/uL (ref 3.9–10.3)

## 2018-06-03 ENCOUNTER — Other Ambulatory Visit: Payer: Self-pay | Admitting: Internal Medicine

## 2018-06-03 ENCOUNTER — Ambulatory Visit: Payer: Medicare HMO

## 2018-06-04 DIAGNOSIS — R0902 Hypoxemia: Secondary | ICD-10-CM | POA: Diagnosis not present

## 2018-06-04 DIAGNOSIS — J449 Chronic obstructive pulmonary disease, unspecified: Secondary | ICD-10-CM | POA: Diagnosis not present

## 2018-06-04 DIAGNOSIS — Z8709 Personal history of other diseases of the respiratory system: Secondary | ICD-10-CM | POA: Diagnosis not present

## 2018-06-04 DIAGNOSIS — Z8739 Personal history of other diseases of the musculoskeletal system and connective tissue: Secondary | ICD-10-CM | POA: Diagnosis not present

## 2018-06-06 ENCOUNTER — Other Ambulatory Visit: Payer: Self-pay | Admitting: Internal Medicine

## 2018-06-07 ENCOUNTER — Telehealth: Payer: Self-pay

## 2018-06-07 ENCOUNTER — Ambulatory Visit: Payer: Medicare HMO

## 2018-06-07 NOTE — Telephone Encounter (Signed)
Per Dr. Mosetta Putt spoke with patient notified her that her anemia has improved and she has no concerns, patient verbalized an understanding.

## 2018-06-07 NOTE — Telephone Encounter (Signed)
-----   Message from Charisse Klinefelter, RN sent at 06/06/2018  2:53 PM EDT -----   ----- Message ----- From: Malachy Mood, MD Sent: 06/06/2018  11:01 AM To: Charisse Klinefelter, RN  Please let her know the lab result, anemia improved, no concerns, thanks  Malachy Mood  06/06/2018

## 2018-06-10 DIAGNOSIS — J9600 Acute respiratory failure, unspecified whether with hypoxia or hypercapnia: Secondary | ICD-10-CM | POA: Diagnosis not present

## 2018-06-10 DIAGNOSIS — J189 Pneumonia, unspecified organism: Secondary | ICD-10-CM | POA: Diagnosis not present

## 2018-06-10 DIAGNOSIS — D509 Iron deficiency anemia, unspecified: Secondary | ICD-10-CM | POA: Diagnosis not present

## 2018-06-10 DIAGNOSIS — J45909 Unspecified asthma, uncomplicated: Secondary | ICD-10-CM | POA: Diagnosis not present

## 2018-06-13 ENCOUNTER — Other Ambulatory Visit: Payer: Self-pay | Admitting: Nurse Practitioner

## 2018-06-13 DIAGNOSIS — K7469 Other cirrhosis of liver: Secondary | ICD-10-CM

## 2018-06-14 ENCOUNTER — Ambulatory Visit: Payer: Medicare HMO

## 2018-06-18 ENCOUNTER — Telehealth: Payer: Self-pay | Admitting: Internal Medicine

## 2018-06-18 NOTE — Telephone Encounter (Signed)
New Message   Pt c/o medication issue:  1. Name of Medication: Celecoxib 200mg    2. How are you currently taking this medication (dosage and times per day)? 200 mg 3. Are you having a reaction (difficulty breathing--STAT)? none  4. What is your medication issue? Patient is calling because she was provided this new medication from a pain specialist. She is unsure about taking the medication. Please call to discuss.

## 2018-06-18 NOTE — Telephone Encounter (Signed)
Pt is calling today to ask if her new prescription, Celecoxib, is safe to take with her heart medications. I have advised pt that since her new med is a NSAID, there is increased risk renal damage, bleeding, and hypertension. Pt states she doesn't really want to take it but wanted to ask Dr Tenny Craw what she thought. I will forward to Dr Tenny Craw and her RN for further review as requested by pt.

## 2018-06-19 DIAGNOSIS — I10 Essential (primary) hypertension: Secondary | ICD-10-CM | POA: Diagnosis not present

## 2018-06-19 DIAGNOSIS — G8929 Other chronic pain: Secondary | ICD-10-CM | POA: Diagnosis not present

## 2018-06-19 DIAGNOSIS — E119 Type 2 diabetes mellitus without complications: Secondary | ICD-10-CM | POA: Diagnosis not present

## 2018-06-19 DIAGNOSIS — N289 Disorder of kidney and ureter, unspecified: Secondary | ICD-10-CM | POA: Diagnosis not present

## 2018-06-19 DIAGNOSIS — E785 Hyperlipidemia, unspecified: Secondary | ICD-10-CM | POA: Diagnosis not present

## 2018-06-19 DIAGNOSIS — J45909 Unspecified asthma, uncomplicated: Secondary | ICD-10-CM | POA: Diagnosis not present

## 2018-06-19 DIAGNOSIS — G4733 Obstructive sleep apnea (adult) (pediatric): Secondary | ICD-10-CM | POA: Diagnosis not present

## 2018-06-19 DIAGNOSIS — E559 Vitamin D deficiency, unspecified: Secondary | ICD-10-CM | POA: Diagnosis not present

## 2018-06-19 DIAGNOSIS — R1013 Epigastric pain: Secondary | ICD-10-CM | POA: Diagnosis not present

## 2018-06-21 ENCOUNTER — Ambulatory Visit (INDEPENDENT_AMBULATORY_CARE_PROVIDER_SITE_OTHER): Payer: Medicare HMO

## 2018-06-21 VITALS — BP 130/70 | HR 66 | Ht 61.0 in | Wt 226.5 lb

## 2018-06-21 DIAGNOSIS — R001 Bradycardia, unspecified: Secondary | ICD-10-CM

## 2018-06-21 NOTE — Progress Notes (Signed)
1.) Reason for visit: EKG   2.) Name of MD requesting visit: Nada Boozer NP  3.) H&P: Junctional Rhythm Rate 52  4.) ROS related to problem: Stopped Metoprolol  5.) Assessment and plan per MD: No changes

## 2018-06-21 NOTE — Patient Instructions (Signed)
Medication Instructions:  Your physician recommends that you continue on your current medications as directed. Please refer to the Current Medication list given to you today.  -- If you need a refill on your cardiac medications before your next appointment, please call your pharmacy. --  Labwork: None ordered  Testing/Procedures: None ordered  Follow-Up: Thank you for choosing [facility]!!    Any Other Special Instructions Will Be Listed Below (If Applicable).          

## 2018-06-21 NOTE — Telephone Encounter (Signed)
Left message that Dr. Tenny Craw recommends not taking Celebrex at this time. Advised to call back with any other questions/concerns.

## 2018-06-21 NOTE — Telephone Encounter (Signed)
I would hold on cox2 inhibitor for now.   Pt just seen by laura ingold today  Note recommendations.  Agree

## 2018-06-24 NOTE — Progress Notes (Signed)
EKG is better   SR Do not resume metoprolol  Follow BP   Call if greater than 145 on average

## 2018-06-25 ENCOUNTER — Telehealth: Payer: Self-pay

## 2018-06-25 NOTE — Telephone Encounter (Signed)
-----   Message from Pricilla Riffle, MD sent at 06/24/2018 11:06 PM EDT -----   ----- Message ----- From: Sigurd Sos, RN Sent: 06/21/2018   2:15 PM EDT To: Pricilla Riffle, MD  Patient OV 7/8 indicated Junctional Rhythm 52 bpm.  Stopped Metoprolol.  Came in today for EKG NSR with HR 66 bpm.

## 2018-06-25 NOTE — Telephone Encounter (Signed)
Spoke to patient and reviewed nurse visit.  She verbalized understanding of Dr Tenny Craw' recommendations pertaining to BP.

## 2018-07-04 ENCOUNTER — Other Ambulatory Visit: Payer: Medicare HMO

## 2018-07-05 DIAGNOSIS — J449 Chronic obstructive pulmonary disease, unspecified: Secondary | ICD-10-CM | POA: Diagnosis not present

## 2018-07-05 DIAGNOSIS — Z8739 Personal history of other diseases of the musculoskeletal system and connective tissue: Secondary | ICD-10-CM | POA: Diagnosis not present

## 2018-07-05 DIAGNOSIS — Z8709 Personal history of other diseases of the respiratory system: Secondary | ICD-10-CM | POA: Diagnosis not present

## 2018-07-05 DIAGNOSIS — R0902 Hypoxemia: Secondary | ICD-10-CM | POA: Diagnosis not present

## 2018-07-09 ENCOUNTER — Encounter: Payer: Self-pay | Admitting: Cardiovascular Disease

## 2018-07-09 ENCOUNTER — Ambulatory Visit (INDEPENDENT_AMBULATORY_CARE_PROVIDER_SITE_OTHER): Payer: Medicare HMO | Admitting: Cardiovascular Disease

## 2018-07-09 DIAGNOSIS — M79604 Pain in right leg: Secondary | ICD-10-CM | POA: Diagnosis not present

## 2018-07-09 DIAGNOSIS — M79605 Pain in left leg: Secondary | ICD-10-CM | POA: Diagnosis not present

## 2018-07-09 NOTE — Assessment & Plan Note (Signed)
Anita Gonzalez was referred to me by Nada Boozer, NP for evaluation of bilateral leg pain and for preoperative vascular clearance before a podiatry procedure.  She does have diabetes and has what appears to be diabetic peripheral neuropathy.  Her recent Doppler studies performed 05/24/2018 revealed normal ABIs with triphasic waveforms.  She has palpable pedal pulses.  I do not think she has vascular disease and is cleared for her upcoming podiatry procedure at low vascular risk.

## 2018-07-09 NOTE — Progress Notes (Signed)
07/09/2018 Anita Gonzalez   1946/01/20  [id number]  Primary Physician [name], MD Primary Cardiologist: Runell Gess MD Nicholes Calamity, [place]  HPI:  Anita Gonzalez is a 72 y.o. moderately overweight single African-American female mother 3, grandmother of a grandchildren referred by Anita Boozer, NP for peripheral vascular evaluation before elective podiatry procedure.  Her cardiologist is Dr. Dietrich Pates.  Risk factors include 30 pack years of tobacco abuse having quit 7 years ago as well as treated diabetes, hypertension and hyperlipidemia.  She does have COPD followed by Dr. Maple Hudson in the past.  She is never had a heart attack or stroke.  She denies chest pain.  There is no family history.  Apparently she needs to toenails removed.  She has symptoms compatible with diabetic peripheral neuropathy.  Dopplers performed 05/24/2018 revealed normal ABIs bilaterally with triphasic waveforms.   Current Meds  Medication Sig  . acetaminophen (TYLENOL) 500 MG tablet Take 500 mg by mouth 2 (two) times daily.   Marland Kitchen acetaminophen-codeine (TYLENOL #3) 300-30 MG tablet Take 1 tablet by mouth every 6 (six) hours as needed for moderate pain.  Marland Kitchen albuterol (PROVENTIL HFA;VENTOLIN HFA) 108 (90 BASE) MCG/ACT inhaler Inhale 2 puffs into the lungs every 6 (six) hours as needed for wheezing or shortness of breath (wheezing).   Marland Kitchen atorvastatin (LIPITOR) 40 MG tablet Take 1 tablet (40 mg total) by mouth daily. Please keep appt for future refills thank you.  . bumetanide (BUMEX) 2 MG tablet Take 0.5 mg by mouth every other day.   . cyanocobalamin 1000 MCG tablet Take 1,000 mcg by mouth every Thursday.   . cyclobenzaprine (FLEXERIL) 10 MG tablet Take 10 mg by mouth at bedtime.   . diclofenac sodium (VOLTAREN) 1 % GEL Apply 1 application topically 4 (four) times daily as needed (for pain).   Marland Kitchen docusate sodium (COLACE) 100 MG capsule Take 100 mg by mouth 2 (two) times daily as needed for mild constipation.  .  ergocalciferol (VITAMIN D2) 50000 UNITS capsule Take 50,000 Units by mouth every Monday.   . ezetimibe (ZETIA) 10 MG tablet Take 1 tablet (10 mg total) by mouth daily. Please keep upcoming appt with Dr. Tenny Craw. Thank you.  . ferrous sulfate 325 (65 FE) MG tablet Take 325 mg by mouth 2 (two) times daily with a meal.   . fluticasone (FLONASE) 50 MCG/ACT nasal spray Place 1 spray into both nostrils daily as needed for allergies or rhinitis.  . folic acid (FOLVITE) 1 MG tablet Take 1 tablet (1 mg total) by mouth daily. Please schedule an appointment for more refills, thanks! [phone]. 1st attempt  . ipratropium-albuterol (DUONEB) 0.5-2.5 (3) MG/3ML SOLN Inhale 3 mLs into the lungs every 6 (six) hours as needed (for shortness of breath).   . losartan (COZAAR) 50 MG tablet Take 50 mg by mouth daily.  . metFORMIN (GLUCOPHAGE) 500 MG tablet Take 500 mg by mouth 2 (two) times daily.  . montelukast (SINGULAIR) 10 MG tablet Take 10 mg by mouth at bedtime.  . pantoprazole (PROTONIX) 40 MG tablet Take 40 mg by mouth daily.   . potassium chloride (K-DUR) 10 MEQ tablet TAKE 1 TABLET BY MOUTH EVERY OTHER DAY  . potassium chloride (K-DUR,KLOR-CON) 10 MEQ tablet Take 10 mEq by mouth every other day.   . SYMBICORT 160-4.5 MCG/ACT inhaler INHALE 2 PUFFS BY MOUTH INTO THE LUNG 2 TIMES A DAY  . verapamil (VERELAN PM) 180 MG 24 hr capsule Take 180 mg  by mouth 2 (two) times daily.     Allergies  Allergen Reactions  . Fish Allergy Anaphylaxis, Hives and Swelling  . Peanuts [Peanut Oil] Anaphylaxis, Hives, Itching and Swelling  . Penicillins Hives, Itching, Swelling and Other (See Comments)    PATIENT HAS HAD A PCN REACTION WITH IMMEDIATE RASH, FACIAL/TONGUE/THROAT SWELLING, SOB, OR LIGHTHEADEDNESS WITH HYPOTENSION:  #  #  YES  #  #  HAS PT DEVELOPED SEVERE RASH INVOLVING MUCUS MEMBRANES or SKIN NECROSIS: #  #  YES  #  # PATIENT HAS HAD A PCN REACTION THAT REQUIRED HOSPITALIZATION:  #  #  YES  #  #  Has patient had  a PCN reaction occurring within the last 10 years: No   . Iodinated Diagnostic Agents Hives, Itching and Swelling    SWELLING REACTION UNSPECIFIED   . Iodine Hives, Itching and Swelling    Patient reports receiving IV dye in abdominal CT scan without issue  . Percocet [Oxycodone-Acetaminophen] Nausea Only, Anxiety and Other (See Comments)    Raised blood pressure, Sweats, nervous,dizziness  . Other Other (See Comments)    UNSPECIFIED REACTION  Hypersensitive to perfumes, colognes, powders, lotions, room sprays  . Sulfa Antibiotics     UNSPECIFIED REACTION   . Hydrocodone Itching and Nausea Only  . Oxycodone Itching, Nausea Only and Other (See Comments)    Sweats and dizziness    Social History   Socioeconomic History  . Marital status: Single    Spouse name: Not on file  . Number of children: Not on file  . Years of education: Not on file  . Highest education level: Not on file  Occupational History  . Occupation: retired  Engineer, production  . Financial resource strain: Not on file  . Food insecurity:    Worry: Not on file    Inability: Not on file  . Transportation needs:    Medical: Not on file    Non-medical: Not on file  Tobacco Use  . Smoking status: Former Smoker    Packs/day: 0.75    Years: 25.00    Pack years: 18.75    Types: Cigarettes    Last attempt to quit: 01/12/2011    Years since quitting: 7.4  . Smokeless tobacco: Never Used  Substance and Sexual Activity  . Alcohol use: Yes    Alcohol/week: 4.0 standard drinks    Types: 4 Cans of beer per week    Comment: heavy drinker in the past  . Drug use: No  . Sexual activity: Not Currently  Lifestyle  . Physical activity:    Days per week: Not on file    Minutes per session: Not on file  . Stress: Not on file  Relationships  . Social connections:    Talks on phone: Not on file    Gets together: Not on file    Attends religious service: Not on file    Active member of club or organization: Not on file     Attends meetings of clubs or organizations: Not on file    Relationship status: Not on file  . Intimate partner violence:    Fear of current or ex partner: Not on file    Emotionally abused: Not on file    Physically abused: Not on file    Forced sexual activity: Not on file  Other Topics Concern  . Not on file  Social History Narrative  . Not on file     Review of Systems: General: negative for  chills, fever, night sweats or weight changes.  Cardiovascular: negative for chest pain, dyspnea on exertion, edema, orthopnea, palpitations, paroxysmal nocturnal dyspnea or shortness of breath Dermatological: negative for rash Respiratory: negative for cough or wheezing Urologic: negative for hematuria Abdominal: negative for nausea, vomiting, diarrhea, bright red blood per rectum, melena, or hematemesis Neurologic: negative for visual changes, syncope, or dizziness All other systems reviewed and are otherwise negative except as noted above.    Blood pressure (!) 142/72, pulse 82, height 5\' 1"  (1.549 m), weight 225 lb 6.4 oz (102.2 kg), SpO2 92 %.  General appearance: alert and no distress Neck: no adenopathy, no carotid bruit, no JVD, supple, symmetrical, trachea midline and thyroid not enlarged, symmetric, no tenderness/mass/nodules Lungs: clear to auscultation bilaterally Heart: regular rate and rhythm, S1, S2 normal, no murmur, click, rub or gallop Extremities: extremities normal, atraumatic, no cyanosis or edema Pulses: 2+ and symmetric Skin: Skin color, texture, turgor normal. No rashes or lesions Neurologic: Alert and oriented X 3, normal strength and tone. Normal symmetric reflexes. Normal coordination and gait  EKG not performed today  ASSESSMENT AND PLAN:   Bilateral leg pain Ms. Swartzlander was referred to me by , NP for evaluation of bilateral leg pain and for preoperative vascular clearance before a podiatry procedure.  She does have diabetes and has what appears  to be diabetic peripheral neuropathy.  Her recent Doppler studies performed 05/24/2018 revealed normal ABIs with triphasic waveforms.  She has palpable pedal pulses.  I do not think she has vascular disease and is cleared for her upcoming podiatry procedure at low vascular risk.      07/25/2018 MD FACP,FACC,FAHA, [facility] 07/09/2018 3:19 PM

## 2018-07-09 NOTE — Patient Instructions (Signed)
Your physician recommends that you schedule a follow-up appointment in: AS NEEDED  

## 2018-07-10 DIAGNOSIS — E1351 Other specified diabetes mellitus with diabetic peripheral angiopathy without gangrene: Secondary | ICD-10-CM | POA: Diagnosis not present

## 2018-07-10 DIAGNOSIS — B351 Tinea unguium: Secondary | ICD-10-CM | POA: Diagnosis not present

## 2018-07-10 DIAGNOSIS — M79675 Pain in left toe(s): Secondary | ICD-10-CM | POA: Diagnosis not present

## 2018-07-10 DIAGNOSIS — L602 Onychogryphosis: Secondary | ICD-10-CM | POA: Diagnosis not present

## 2018-07-11 DIAGNOSIS — J189 Pneumonia, unspecified organism: Secondary | ICD-10-CM | POA: Diagnosis not present

## 2018-07-11 DIAGNOSIS — J9600 Acute respiratory failure, unspecified whether with hypoxia or hypercapnia: Secondary | ICD-10-CM | POA: Diagnosis not present

## 2018-07-11 DIAGNOSIS — J45909 Unspecified asthma, uncomplicated: Secondary | ICD-10-CM | POA: Diagnosis not present

## 2018-07-11 DIAGNOSIS — D509 Iron deficiency anemia, unspecified: Secondary | ICD-10-CM | POA: Diagnosis not present

## 2018-07-16 ENCOUNTER — Ambulatory Visit
Admission: RE | Admit: 2018-07-16 | Discharge: 2018-07-16 | Disposition: A | Discharge status: Home / Self Care | Payer: Medicare HMO | Source: Ambulatory Visit | Attending: Nurse Practitioner | Admitting: Nurse Practitioner

## 2018-07-16 DIAGNOSIS — K8020 Calculus of gallbladder without cholecystitis without obstruction: Secondary | ICD-10-CM | POA: Diagnosis not present

## 2018-07-16 DIAGNOSIS — K7460 Unspecified cirrhosis of liver: Secondary | ICD-10-CM | POA: Diagnosis not present

## 2018-07-16 DIAGNOSIS — K7469 Other cirrhosis of liver: Secondary | ICD-10-CM

## 2018-07-17 ENCOUNTER — Telehealth: Payer: Self-pay | Admitting: *Deleted

## 2018-07-17 NOTE — Progress Notes (Signed)
[facility]  [phone] [phone]  Clinic Follow up Note   Patient Care Team: [name], MD as PCP - General (Internal Medicine) Jearld Lesch, MD as Referring Physician (Specialist) Jearld Lesch, MD as Referring Physician (Specialist) Jeani Hawking, MD as Consulting Physician (Gastroenterology) Clyde Lundborg., MD as Referring Physician (Anesthesiology) Larey Dresser, DPM as Consulting Physician (Podiatry)   Date of Service:  07/18/2018  CHIEF COMPLAINT:  Follow-up iron deficient anemia  HISTORY OF PRESENTING ILLNESS (01/2015):  Anita Gonzalez 72 y.o. female with history of DM 2, HTN, HLD, asthma, rheumatoid arthritis, former smoker, recently moved to this area from [place], is here because of anemia.   She presented to the ED on 01/06/2015 with fever, chills, productive cough, dyspnea, wheezing, chest pain and poor appetite of ~ 1 week duration. In the ED, febrile to 102.52F, tachycardic, WBC 12.5 and chest x-ray suggestive of right lower lobe pneumonia. Treating for CAP, then developed acute kidney injury-improving and narrow complex tachycardia 2/27. Her clinical condition improved and she was discharged home with oxygen on 01/11/2015. During her hospital stay, she was found to have moderate anemia with hemoglobin 8-9, low serum iron and ferritin level. She was referred to our clinic for further evaluation.  She was found to have abnormal CBC from 2011, and has been on iron pill two pill daily, and B12 injection and pill for the past two years. She had EGD, capsule endoscopy and colonoscopy in [place] a few years ago by Dr. Francella Solian, which was normal per pt.   She denies recent chest pain on exertion, (+) shortness of breath on exertion at night, no pre-syncopal episodes, or palpitations. She had not noticed any recent bleeding such as epistaxis, hematuria or hematochezia The patient denies over the counter NSAID  ingestion. She is not on antiplatelets agents. She had no prior history or diagnosis of cancer. Her age appropriate screening programs are up-to-date. She denies any pica and eats a variety of diet. She never donated blood or received blood transfusion  She is still on oxygen. She has some dyspnea at night. No orthopnea. She is able to do all her ADLs and light housework. She was not very physically active even before the hospitalization last month.  CURRENT THERAPY:  1.  IV Feraheme as need, received in 01/2015, 03/2015 and 09/2015, 11/29/2015, 12/06/2015, 12/22/2016, 01/15/2017, 08/02/17 2. Ferrous sulfate 2 tablets a day  INTERIM HISTORY:  Anita Gonzalez returns for follow-up. She is here alone at the clinic. She is doing well but complains of low energy. She uses a wheelchair to ambulate.       MEDICAL HISTORY:  Past Medical History:  Diagnosis Date  . Acute kidney injury (HCC) 01/11/2015  . Alcohol abuse, in remission   . Anemia   . Anemia, iron deficiency 01/28/2015  . Arthritis   . Asthma   . Bronchiectasis (HCC) 03/07/2016  . Bronchitis   . COPD (chronic obstructive pulmonary disease) (HCC)   . Diabetes mellitus without complication (HCC)    type 2  . DM II (diabetes mellitus, type II), controlled (HCC) 01/06/2015  . Dyspnea and respiratory abnormality 04/06/2015  . Dysrhythmia    hx of palpitations  . E-coli UTI 01/11/2015  . Essential hypertension 01/06/2015  . GERD (gastroesophageal reflux disease)   . Hay fever   . Hepatitis    c treated with pills  . HLD (hyperlipidemia) 01/06/2015  . Hypertension   . IBS (irritable bowel  syndrome)   . ILD (interstitial lung disease) (HCC) 12/11/2016  . Leg cramps, sleep related 12/01/2015  . Pneumonia   . Rheumatoid arthritis (HCC) 1996  . Sinus tachycardia 01/11/2015   SURGICAL HISTORY: Past Surgical History:  Procedure Laterality Date  . ABDOMINAL HYSTERECTOMY    . BALLOON DILATION N/A 03/15/2018   Procedure: BALLOON DILATION;  Surgeon:  Jeani Hawking, MD;  Location: [facility];  Service: Endoscopy;  Laterality: N/A;  . CARPAL TUNNEL RELEASE Left 04/19/2018   Procedure: CARPAL TUNNEL RELEASE;  Surgeon: Coletta Memos, MD;  Location: [facility] OR;  Service: Neurosurgery;  Laterality: Left;  CARPAL TUNNEL RELEASE  . CESAREAN SECTION    . COLONOSCOPY WITH PROPOFOL N/A 04/02/2015   Procedure: COLONOSCOPY WITH PROPOFOL;  Surgeon: Jeani Hawking, MD;  Location: [facility];  Service: Endoscopy;  Laterality: N/A;  . ESOPHAGOGASTRODUODENOSCOPY (EGD) WITH PROPOFOL N/A 04/02/2015   Procedure: ESOPHAGOGASTRODUODENOSCOPY (EGD) WITH PROPOFOL;  Surgeon: Jeani Hawking, MD;  Location: [facility];  Service: Endoscopy;  Laterality: N/A;  . ESOPHAGOGASTRODUODENOSCOPY (EGD) WITH PROPOFOL N/A 03/15/2018   Procedure: ESOPHAGOGASTRODUODENOSCOPY (EGD) WITH PROPOFOL;  Surgeon: Jeani Hawking, MD;  Location: [facility];  Service: Endoscopy;  Laterality: N/A;  . HEMORRHOID SURGERY    . HERNIA REPAIR    . HERNIA REPAIR     SOCIAL HISTORY: Social History   Socioeconomic History  . Marital status: Single    Spouse name: Not on file  . Number of children: Not on file  . Years of education: Not on file  . Highest education level: Not on file  Occupational History  . Occupation: retired  Engineer, production  . Financial resource strain: Not on file  . Food insecurity:    Worry: Not on file    Inability: Not on file  . Transportation needs:    Medical: Not on file    Non-medical: Not on file  Tobacco Use  . Smoking status: Former Smoker    Packs/day: 0.75    Years: 25.00    Pack years: 18.75    Types: Cigarettes    Last attempt to quit: 01/12/2011    Years since quitting: 7.5  . Smokeless tobacco: Never Used  Substance and Sexual Activity  . Alcohol use: Yes    Alcohol/week: 4.0 standard drinks    Types: 4 Cans of beer per week    Comment: heavy drinker in the past  . Drug use: No  . Sexual activity: Not Currently  Lifestyle  . Physical activity:     Days per week: Not on file    Minutes per session: Not on file  . Stress: Not on file  Relationships  . Social connections:    Talks on phone: Not on file    Gets together: Not on file    Attends religious service: Not on file    Active member of club or organization: Not on file    Attends meetings of clubs or organizations: Not on file    Relationship status: Not on file  . Intimate partner violence:    Fear of current or ex partner: Not on file    Emotionally abused: Not on file    Physically abused: Not on file    Forced sexual activity: Not on file  Other Topics Concern  . Not on file  Social History Narrative  . Not on file   FAMILY HISTORY: Family History  Problem Relation Age of Onset  . Diabetes Mother   . Heart failure Mother   . Throat  cancer Brother        throat cancer   . Asthma Other   . COPD Other   . Hypertension Other   . Stroke Other   . Heart disease Other    ALLERGIES:  is allergic to fish allergy; peanuts [peanut oil]; penicillins; iodinated diagnostic agents; iodine; percocet [oxycodone-acetaminophen]; other; sulfa antibiotics; hydrocodone; and oxycodone.  MEDICATIONS:  Current Outpatient Medications  Medication Sig Dispense Refill  . acetaminophen (TYLENOL) 500 MG tablet Take 500 mg by mouth 2 (two) times daily.     Marland Kitchen acetaminophen-codeine (TYLENOL #3) 300-30 MG tablet Take 1 tablet by mouth every 6 (six) hours as needed for moderate pain. 30 tablet 0  . albuterol (PROVENTIL HFA;VENTOLIN HFA) 108 (90 BASE) MCG/ACT inhaler Inhale 2 puffs into the lungs every 6 (six) hours as needed for wheezing or shortness of breath (wheezing).     Marland Kitchen atorvastatin (LIPITOR) 40 MG tablet Take 1 tablet (40 mg total) by mouth daily. Please keep appt for future refills thank you. 90 tablet 0  . bumetanide (BUMEX) 2 MG tablet Take 0.5 mg by mouth every other day.     . cyanocobalamin 1000 MCG tablet Take 1,000 mcg by mouth every Thursday.     . cyclobenzaprine (FLEXERIL)  10 MG tablet Take 10 mg by mouth at bedtime.     . diclofenac sodium (VOLTAREN) 1 % GEL Apply 1 application topically 4 (four) times daily as needed (for pain).     Marland Kitchen docusate sodium (COLACE) 100 MG capsule Take 100 mg by mouth 2 (two) times daily as needed for mild constipation.    . ergocalciferol (VITAMIN D2) 50000 UNITS capsule Take 50,000 Units by mouth every Monday.     . ezetimibe (ZETIA) 10 MG tablet Take 1 tablet (10 mg total) by mouth daily. Please keep upcoming appt with Dr. Tenny Craw. Thank you. 30 tablet 0  . ferrous sulfate 325 (65 FE) MG tablet Take 325 mg by mouth 2 (two) times daily with a meal.     . fluticasone (FLONASE) 50 MCG/ACT nasal spray Place 1 spray into both nostrils daily as needed for allergies or rhinitis.    . folic acid (FOLVITE) 1 MG tablet Take 1 tablet (1 mg total) by mouth daily. Please schedule an appointment for more refills, thanks! [phone]. 1st attempt 30 tablet 0  . ipratropium-albuterol (DUONEB) 0.5-2.5 (3) MG/3ML SOLN Inhale 3 mLs into the lungs every 6 (six) hours as needed (for shortness of breath).     . losartan (COZAAR) 50 MG tablet Take 50 mg by mouth daily.  2  . metFORMIN (GLUCOPHAGE) 500 MG tablet Take 500 mg by mouth 2 (two) times daily.    . montelukast (SINGULAIR) 10 MG tablet Take 10 mg by mouth at bedtime.    . pantoprazole (PROTONIX) 40 MG tablet Take 40 mg by mouth daily.     . potassium chloride (K-DUR) 10 MEQ tablet TAKE 1 TABLET BY MOUTH EVERY OTHER DAY 45 tablet 3  . potassium chloride (K-DUR,KLOR-CON) 10 MEQ tablet Take 10 mEq by mouth every other day.     . SYMBICORT 160-4.5 MCG/ACT inhaler INHALE 2 PUFFS BY MOUTH INTO THE LUNG 2 TIMES A DAY 30.6 g 1  . verapamil (VERELAN PM) 180 MG 24 hr capsule Take 180 mg by mouth 2 (two) times daily.  3   No current facility-administered medications for this visit.    REVIEW OF SYSTEMS:  Constitutional: Denies fevers, chills or abnormal night sweats (+) fatigue  Eyes: Denies blurriness of  vision, double vision or watery eyes Ears, nose, mouth, throat, and face: Denies mucositis or sore throat Respiratory: Denies cough, dyspnea, no wheezes Cardiovascular: Denies palpitation, chest discomfort or lower extremity swelling Gastrointestinal:  Denies nausea, heartburn (+) constipation, manageable Skin: Denies abnormal skin rashes MSK: (+) rheumatoid arthritis in her b/l knees Lymphatics: Denies new lymphadenopathy or easy bruising Neurological:Denies numbness, tingling or new weaknesses Behavioral/Psych: Mood is stable, no new changes  All other systems were reviewed with the patient and are negative.  PHYSICAL EXAMINATION: ECOG PERFORMANCE STATUS: 2  Vitals:   07/18/18 1459  BP: 129/62  Pulse: 65  Resp: 18  Temp: 98.7 F (37.1 C)  SpO2: 95%   Filed Weights   07/18/18 1459  Weight: 223 lb 9.6 oz (101.4 kg)     GENERAL:alert, no distress and comfortable, in a wheelchair  SKIN: skin color, texture, turgor are normal, no rashes or significant lesions EYES: normal, conjunctiva are pink and non-injected, sclera clear OROPHARYNX:no exudate, no erythema and lips, buccal mucosa, and tongue normal  NECK: supple, thyroid normal size, non-tender, without nodularity LYMPH:  no palpable lymphadenopathy in the cervical, axillary or inguinal LUNGS: clear to auscultation and percussion with normal breathing effort HEART: regular rate & rhythm and no murmurs and no lower extremity edema ABDOMEN:abdomen soft, non-tender and normal bowel sounds Musculoskeletal:no cyanosis of digits and no clubbing (+) Presents in ambulatory with a walker. PSYCH: alert & oriented x 3 with fluent speech NEURO: no focal motor/sensory deficits  LABORATORY DATA:  I have reviewed the data as listed CBC Latest Ref Rng & Units 07/18/2018 05/29/2018 04/19/2018  WBC 3.9 - 10.3 K/uL 4.7 4.8 5.5  Hemoglobin 11.6 - 15.9 g/dL 61.6 07.3 11.7(L)  Hematocrit 34.8 - 46.6 % 39.6 37.1 39.0  Platelets 145 - 400 K/uL 244  228 285   CMP Latest Ref Rng & Units 04/19/2018 07/13/2017 02/23/2017  Glucose 65 - 99 mg/dL 710(G) 269(S) 97  BUN 6 - 20 mg/dL 16 12 15   Creatinine 0.44 - 1.00 mg/dL 8.54(O) 2.70(J) 5.00(X)  Sodium 135 - 145 mmol/L 141 139 144  Potassium 3.5 - 5.1 mmol/L 4.3 3.3(L) 4.5  Chloride 101 - 111 mmol/L 102 97(L) 98  CO2 22 - 32 mmol/L 30 30 32(H)  Calcium 8.9 - 10.3 mg/dL 9.0 9.3 9.8  Total Protein 6.5 - 8.1 g/dL - 6.6 -  Total Bilirubin 0.3 - 1.2 mg/dL - 0.5 -  Alkaline Phos 38 - 126 U/L - 85 -  AST 15 - 41 U/L - 15 -  ALT 14 - 54 U/L - 13(L) -    Results for ISLAROSE, LOCKHEART (MRN [id number]) as of 07/17/2018 12:27  Ref. Range 11/21/2017 14:44 01/31/2018 12:26 04/18/2018 13:05  Iron Latest Ref Range: 41 - 142 ug/dL 70 48 48  UIBC Latest Units: ug/dL 169 678 938  TIBC Latest Ref Range: 236 - 444 ug/dL 101 751 025  %SAT Latest Ref Range: 21 - 57 %     Saturation Ratios Latest Ref Range: 21 - 57 % 26 17 (L) 17 (L)  Ferritin Latest Ref Range: 9 - 269 ng/mL 173 125 117     RADIOGRAPHIC STUDIES: I have personally reviewed the radiological images as listed and agreed with the findings in the report. US Abdomen Limited Ruq  Result Date: 07/16/2018 CLINICAL DATA:  Cirrhosis.  HCC screening. EXAM: ULTRASOUND ABDOMEN LIMITED RIGHT UPPER QUADRANT COMPARISON:  None. FINDINGS: Gallbladder: Cholelithiasis is identified with no wall thickening, pericholecystic  fluid, or Murphy's sign. Common bile duct: Diameter: 3.7 mm Liver: Coarsened echotexture with no focal mass. Diffuse increased echotexture. Portal vein is patent on color Doppler imaging with normal direction of blood flow towards the liver. IMPRESSION: 1. Cholelithiasis. 2. Coarsened echotexture in the liver consistent with history of cirrhosis. No focal mass seen. Evaluation for masses is limited due to the coarsened echotexture. Electronically Signed   By: Gerome Sam III M.D   On: 07/16/2018 13:13   ASSESSMENT & PLAN:  72 y.o. African-American female  with past medical history of diabetes, hypertension, rheumatoid arthritis, who has chronic anemia for a few years.  1. Normocytic anemia secondary to iron deficiency, from GI AVM bleeding  -She has moderate anemia with baseline hemoglobin in 8-9 range, MCV normal, ferritin 16, serum iron 13, URBC elevated at 430, iron saturation 3%, this is consistent with iron deficient anemia -Anemia has been a few years, likely secondary to slow GI bleeding. Her stool OB was positive. Colonoscopy showed AVM, last done in 03/2015. -Her baseline SPEP and UPEP were negative.  -She also may have a component of anemia of chronic disease secondary to kidney disease and rheumatoid arthritis -Her hemoglobin has responded to IV feraheme in the past, she received IV Feraheme 4 times in 2016 and twice in 2018 (January and march) -Labs reviewed, today's CBC showed Hg 12.2 Hct 39.6. Iron studies and CMP pending -Last IV Feraheme in 07/2017.  Will continue to monitor with labs every 3 months.  -Continue Ferrous Sulfate.  -F/u in 6 months  -she knows what to watch at home, will call clinic for increased fatigue, diarrhea, blood in stool.   2. Hypertension, diabetes, rheumatoid arthritis, asthma -She will continue follow-up with her primary care physician and other specialists  Plan -Labs reviewed, CBC WNLs. Iron studies pending. Will set up iv iron if needed  -Continue oral iron  -F/u in 6 months  -Labs in 3 months  All questions were answered. The patient knows to call the clinic with any problems, questions or concerns. I spent 15 minutes counseling the patient face to face. The total time spent in the appointment was 20 minutes and more than 50% was on counseling.  Elenor Legato Dweik am acting as scribe for Dr. Malachy Mood.  I have reviewed the above documentation for accuracy and completeness, and I agree with the above.      Malachy Mood, MD 07/18/2018

## 2018-07-17 NOTE — Telephone Encounter (Signed)
RECEIVED MESSAGE FROM Sherian Rein NP . TO ROUTED LAST OFFICE NOTE TO PODIATRY  FOR CLEARANCE.  SPOKE TO PATIENT OBTAIN PHONE FOR [facility].   PHONE AND FAX # [email or web address]. ROUTED OFFICE NOTE TO PCP AND DR Judith Part OFFICE.

## 2018-07-18 ENCOUNTER — Telehealth: Payer: Self-pay

## 2018-07-18 ENCOUNTER — Inpatient Hospital Stay: Payer: Medicare HMO | Attending: Hematology | Admitting: Hematology

## 2018-07-18 ENCOUNTER — Inpatient Hospital Stay: Payer: Medicare HMO

## 2018-07-18 ENCOUNTER — Encounter: Payer: Self-pay | Admitting: Hematology

## 2018-07-18 VITALS — BP 129/62 | HR 65 | Temp 98.7°F | Resp 18 | Ht 61.0 in | Wt 223.6 lb

## 2018-07-18 DIAGNOSIS — E119 Type 2 diabetes mellitus without complications: Secondary | ICD-10-CM | POA: Diagnosis not present

## 2018-07-18 DIAGNOSIS — M069 Rheumatoid arthritis, unspecified: Secondary | ICD-10-CM | POA: Diagnosis not present

## 2018-07-18 DIAGNOSIS — J45909 Unspecified asthma, uncomplicated: Secondary | ICD-10-CM | POA: Insufficient documentation

## 2018-07-18 DIAGNOSIS — K922 Gastrointestinal hemorrhage, unspecified: Secondary | ICD-10-CM | POA: Diagnosis not present

## 2018-07-18 DIAGNOSIS — N289 Disorder of kidney and ureter, unspecified: Secondary | ICD-10-CM | POA: Diagnosis not present

## 2018-07-18 DIAGNOSIS — Q2733 Arteriovenous malformation of digestive system vessel: Secondary | ICD-10-CM | POA: Diagnosis not present

## 2018-07-18 DIAGNOSIS — D508 Other iron deficiency anemias: Secondary | ICD-10-CM | POA: Diagnosis not present

## 2018-07-18 DIAGNOSIS — D500 Iron deficiency anemia secondary to blood loss (chronic): Secondary | ICD-10-CM | POA: Diagnosis present

## 2018-07-18 DIAGNOSIS — I10 Essential (primary) hypertension: Secondary | ICD-10-CM | POA: Diagnosis not present

## 2018-07-18 DIAGNOSIS — D509 Iron deficiency anemia, unspecified: Secondary | ICD-10-CM

## 2018-07-18 LAB — CBC WITH DIFFERENTIAL/PLATELET
BASOS ABS: 0 10*3/uL (ref 0.0–0.1)
Basophils Relative: 0 %
EOS PCT: 4 %
Eosinophils Absolute: 0.2 10*3/uL (ref 0.0–0.5)
HCT: 39.6 % (ref 34.8–46.6)
Hemoglobin: 12.2 g/dL (ref 11.6–15.9)
LYMPHS PCT: 29 %
Lymphs Abs: 1.4 10*3/uL (ref 0.9–3.3)
MCH: 30.7 pg (ref 25.1–34.0)
MCHC: 30.8 g/dL — ABNORMAL LOW (ref 31.5–36.0)
MCV: 99.5 fL (ref 79.5–101.0)
MONO ABS: 0.3 10*3/uL (ref 0.1–0.9)
Monocytes Relative: 6 %
NEUTROS ABS: 2.9 10*3/uL (ref 1.5–6.5)
Neutrophils Relative %: 61 %
PLATELETS: 244 10*3/uL (ref 145–400)
RBC: 3.98 MIL/uL (ref 3.70–5.45)
RDW: 13.1 % (ref 11.2–14.5)
WBC: 4.7 10*3/uL (ref 3.9–10.3)

## 2018-07-18 LAB — IRON AND TIBC
Iron: 51 ug/dL (ref 41–142)
SATURATION RATIOS: 17 % — AB (ref 21–57)
TIBC: 306 ug/dL (ref 236–444)
UIBC: 255 ug/dL

## 2018-07-18 LAB — FERRITIN: Ferritin: 125 ng/mL (ref 11–307)

## 2018-07-18 NOTE — Telephone Encounter (Signed)
Printed avs and calender of upcoming appointment. Per 9/5 los 

## 2018-07-19 IMAGING — DX DG RIBS W/ CHEST 3+V*L*
4 series · 4 of 4 positions shown · non-contrast
Comparison: None.

CLINICAL DATA: Status post fall.

EXAM:
LEFT RIBS AND CHEST - 3+ VIEW

[rib ap]
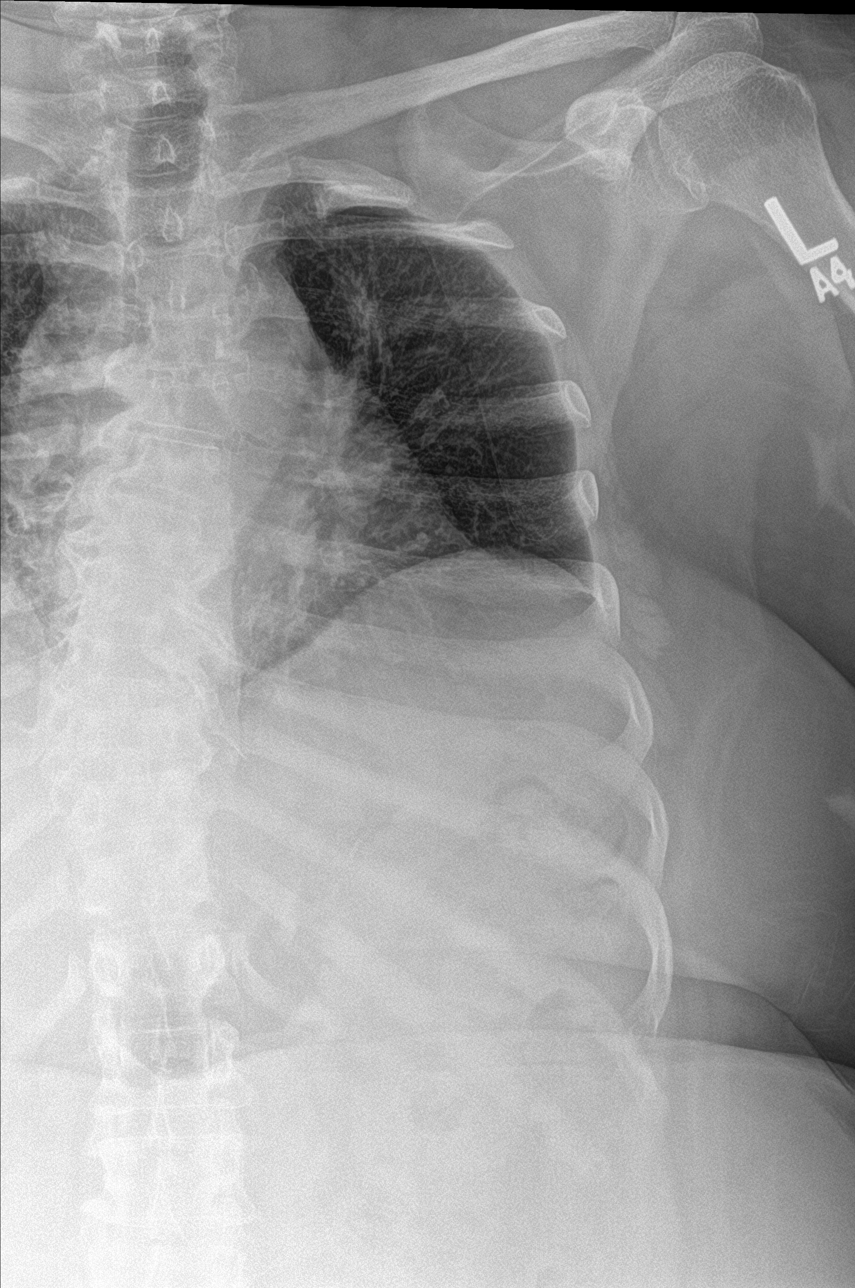

[rib ap obl (1 of 2)]
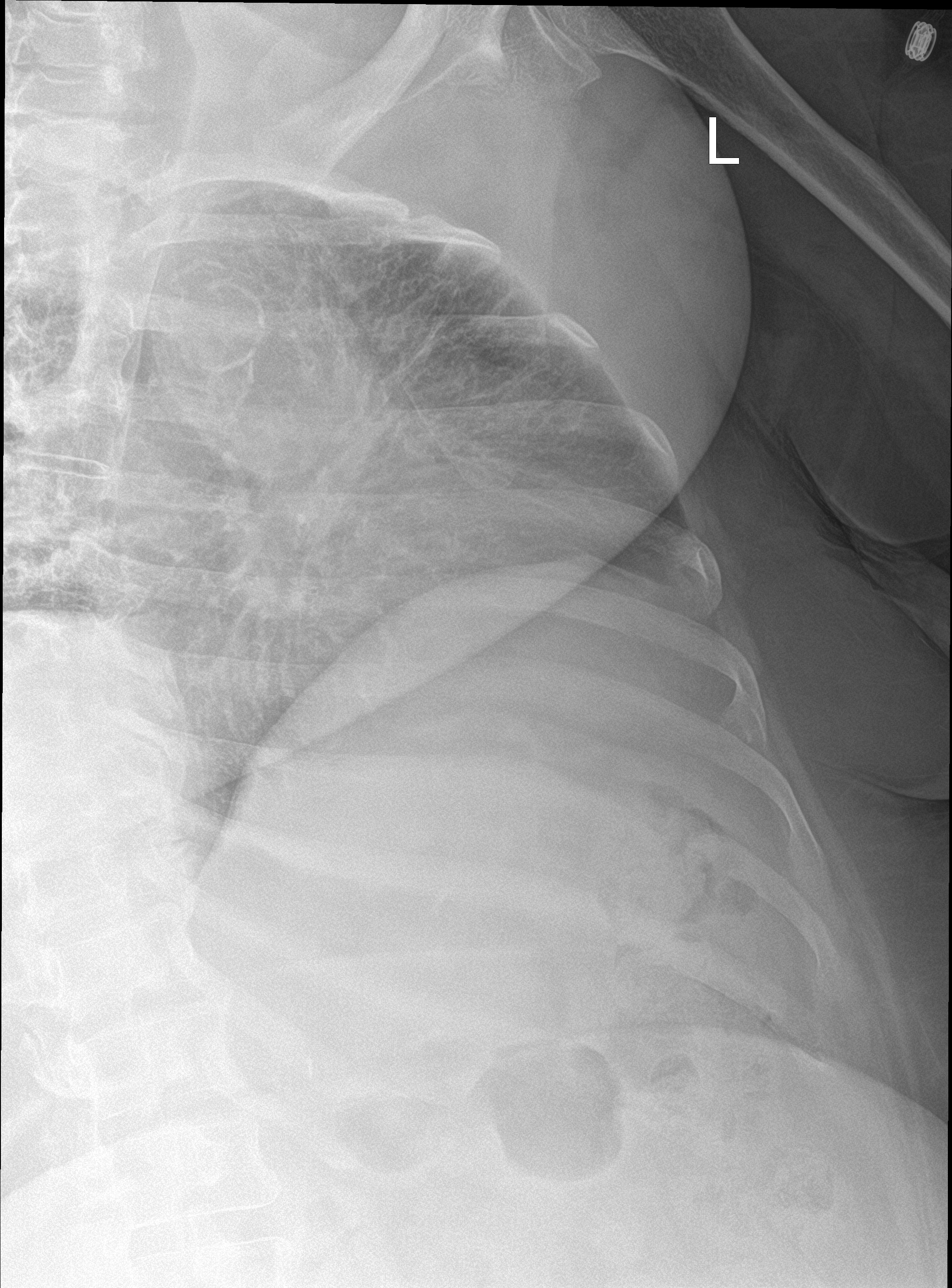

[chest ap]
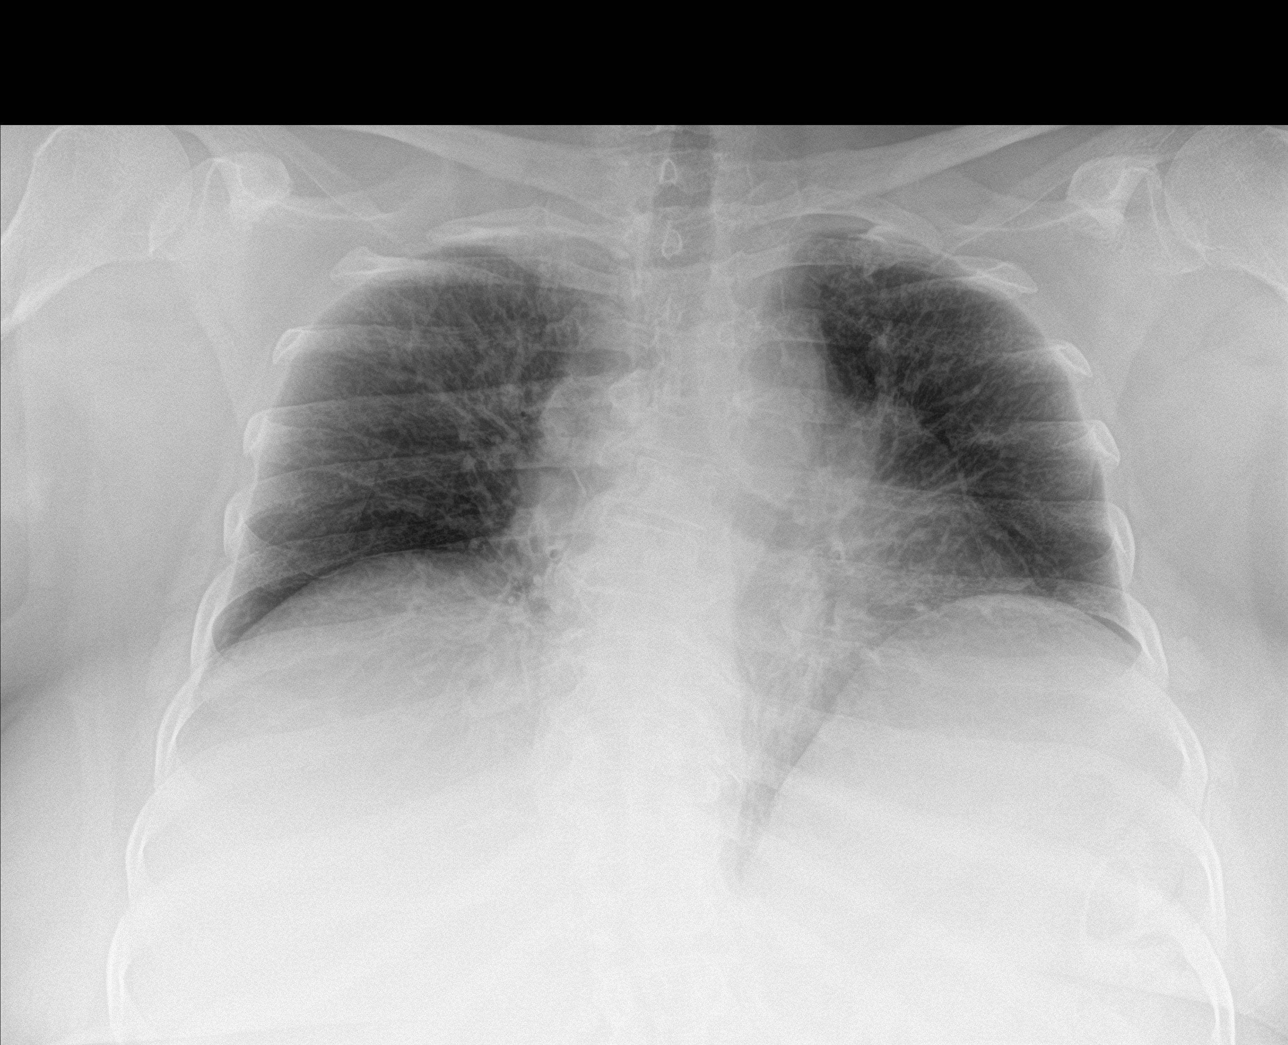

[rib ap obl (2 of 2)]
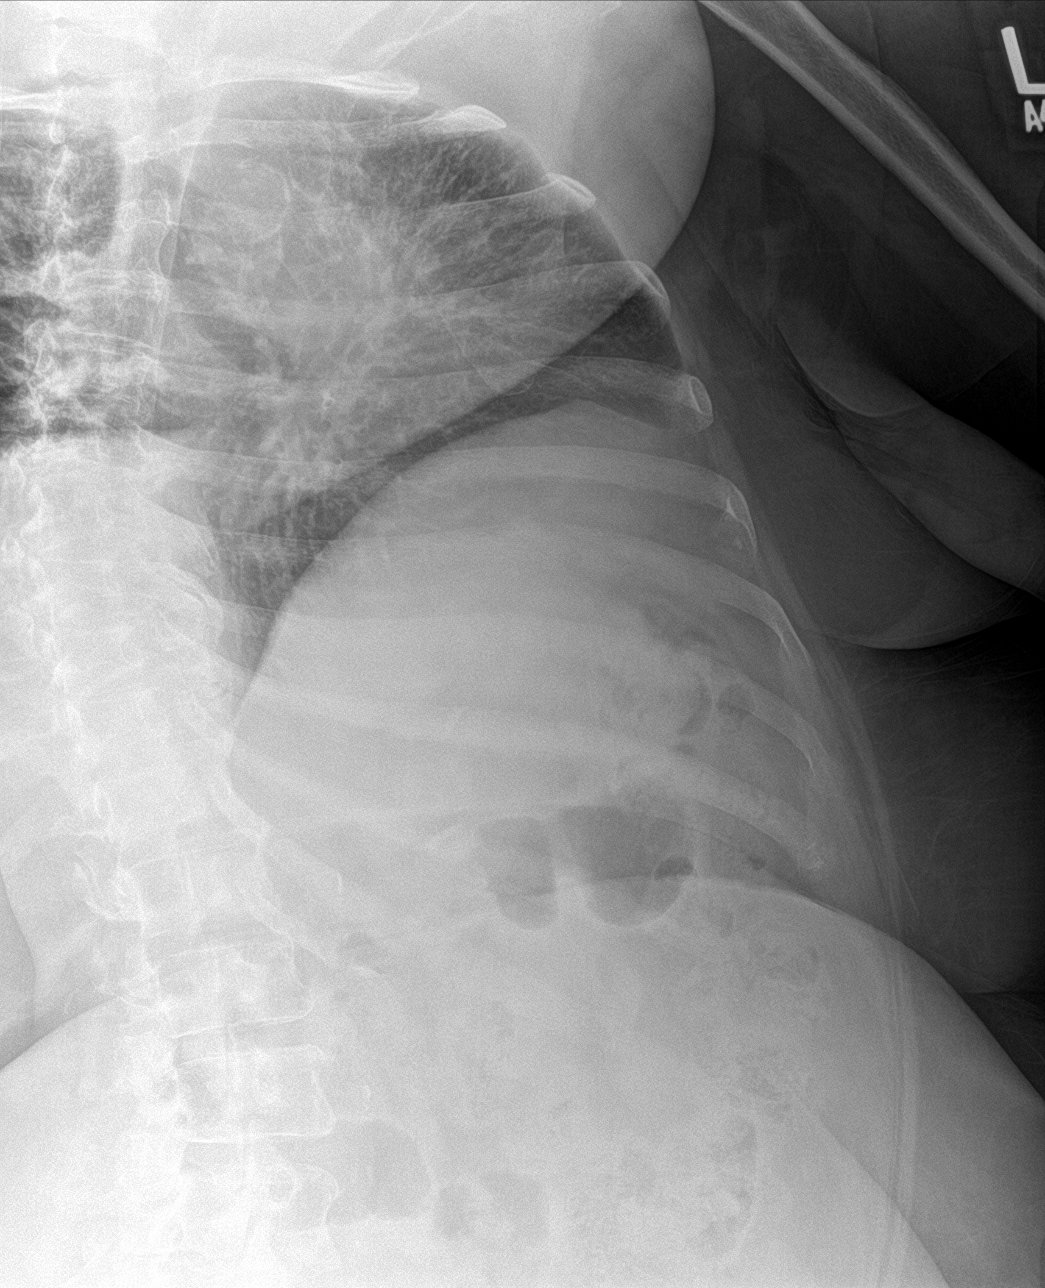

[4 of 4 positions shown; findings below may reference images not displayed]

FINDINGS: No fracture or other bone lesions are seen involving the ribs. There
is no evidence of pneumothorax or pleural effusion. Both lungs are
clear. Heart size and mediastinal contours are within normal limits.
IMPRESSION: Negative.

## 2018-07-19 IMAGING — DX DG LUMBAR SPINE COMPLETE 4+V
5 series · 5 of 5 positions shown · non-contrast
Comparison: None.

CLINICAL DATA: Fall.

EXAM:
LUMBAR SPINE - COMPLETE 4+ VIEW

[l-spine ap]
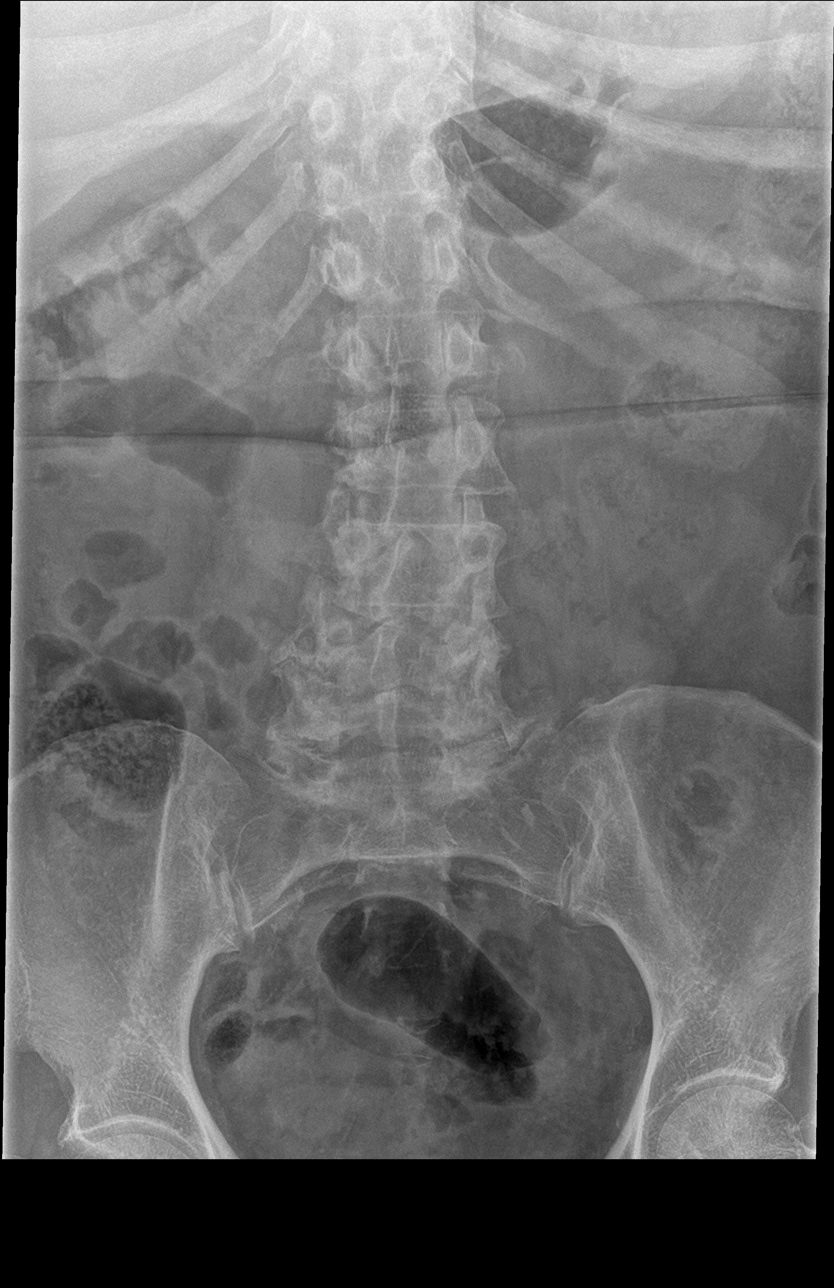

[l-spine obl (1 of 2)]
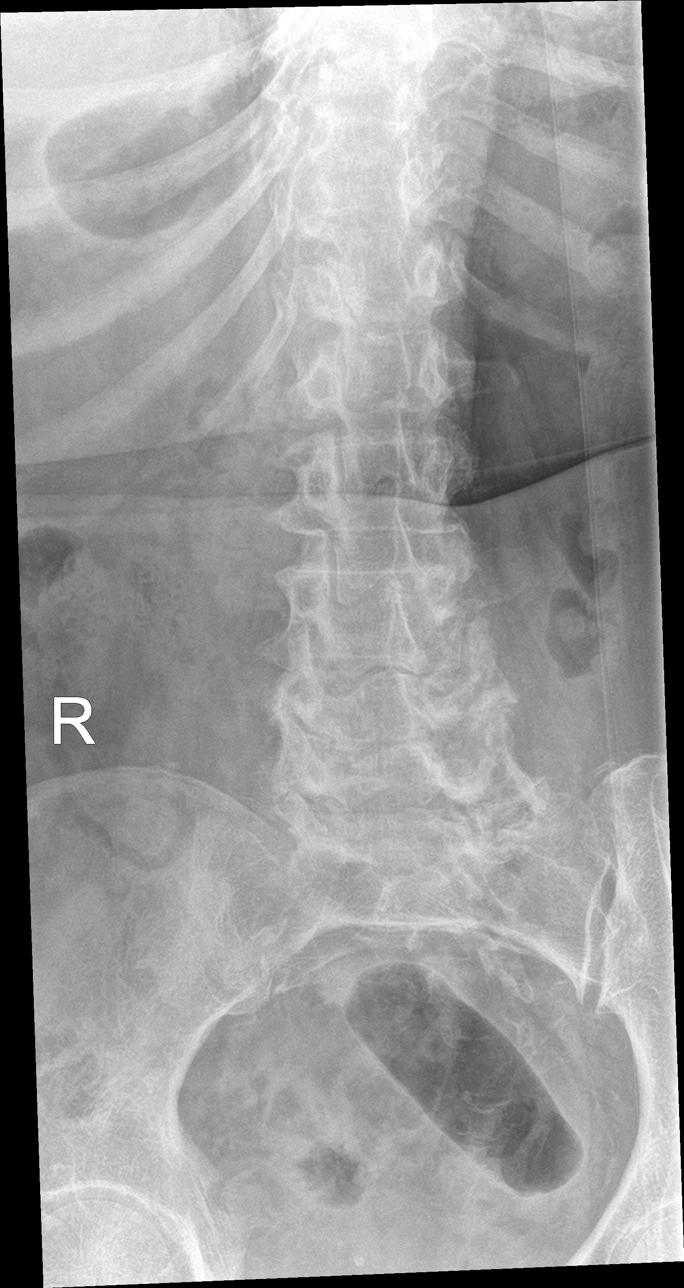

[l-spine obl (2 of 2)]
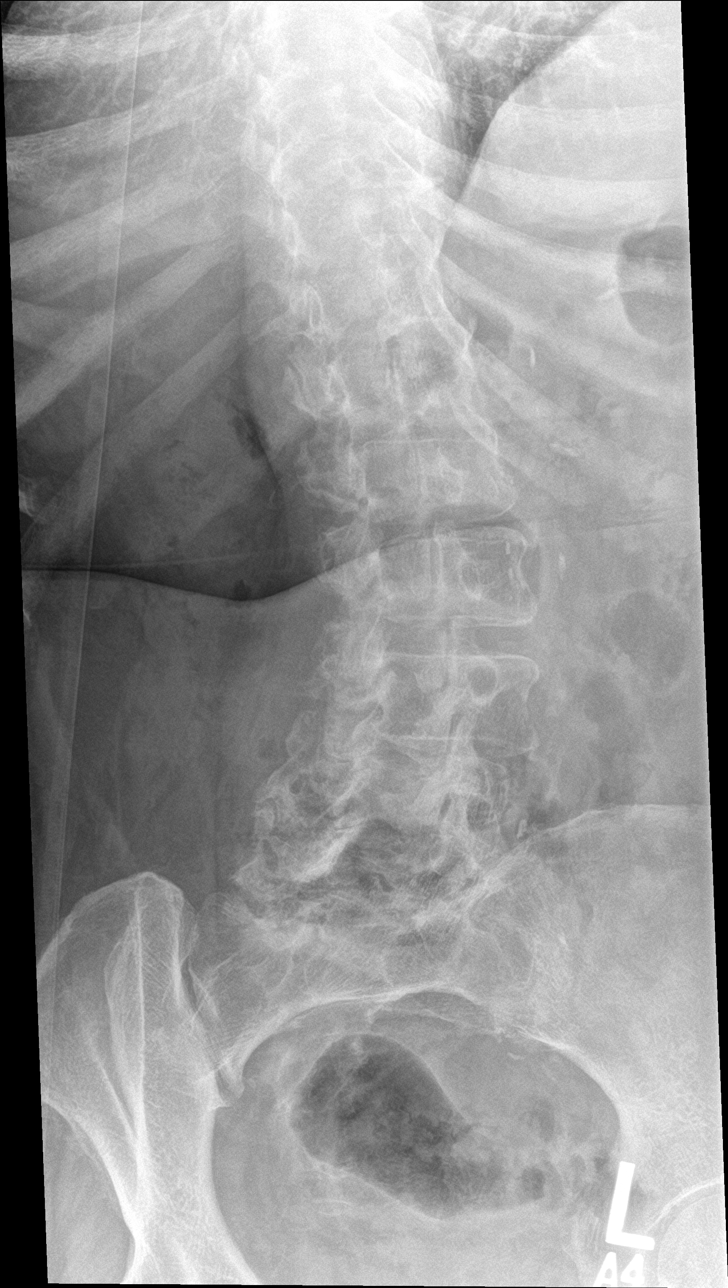

[l-spine lat]
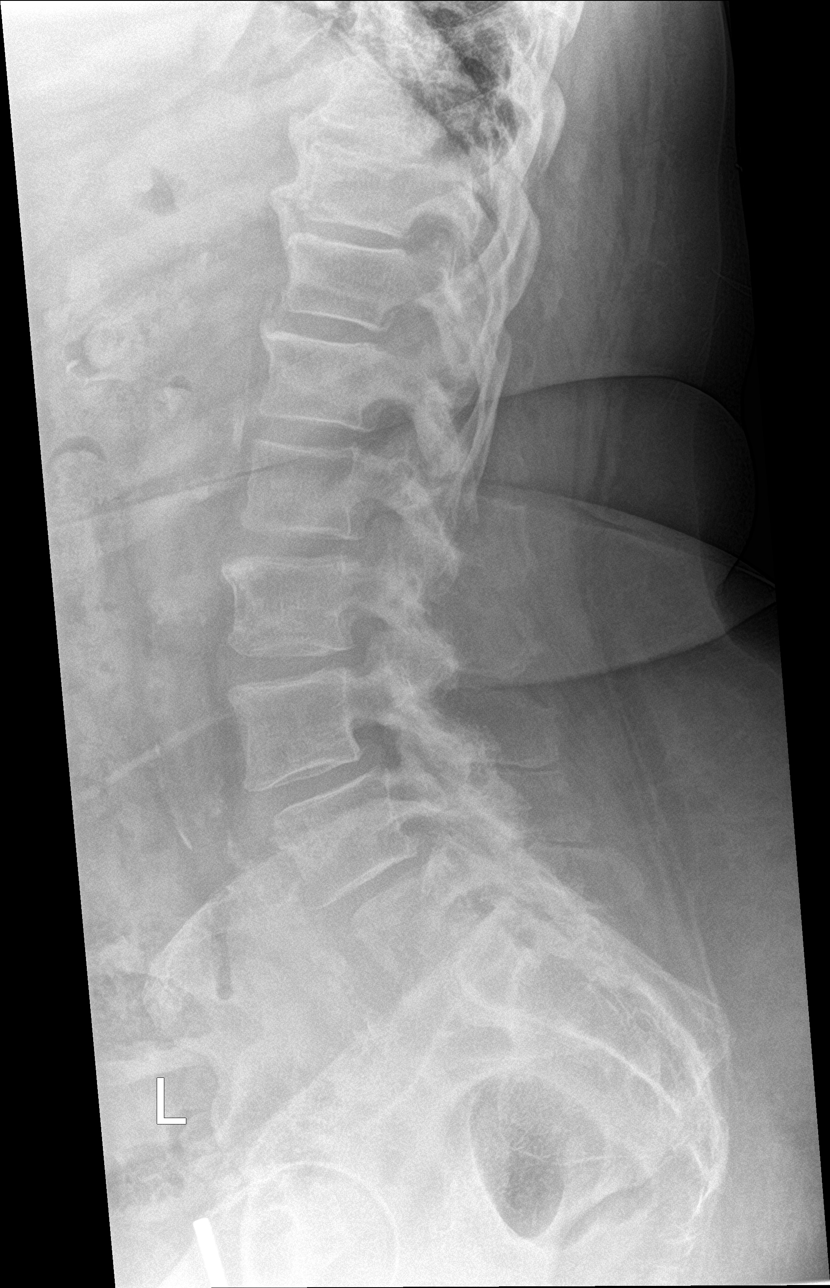

[l-spine spot]
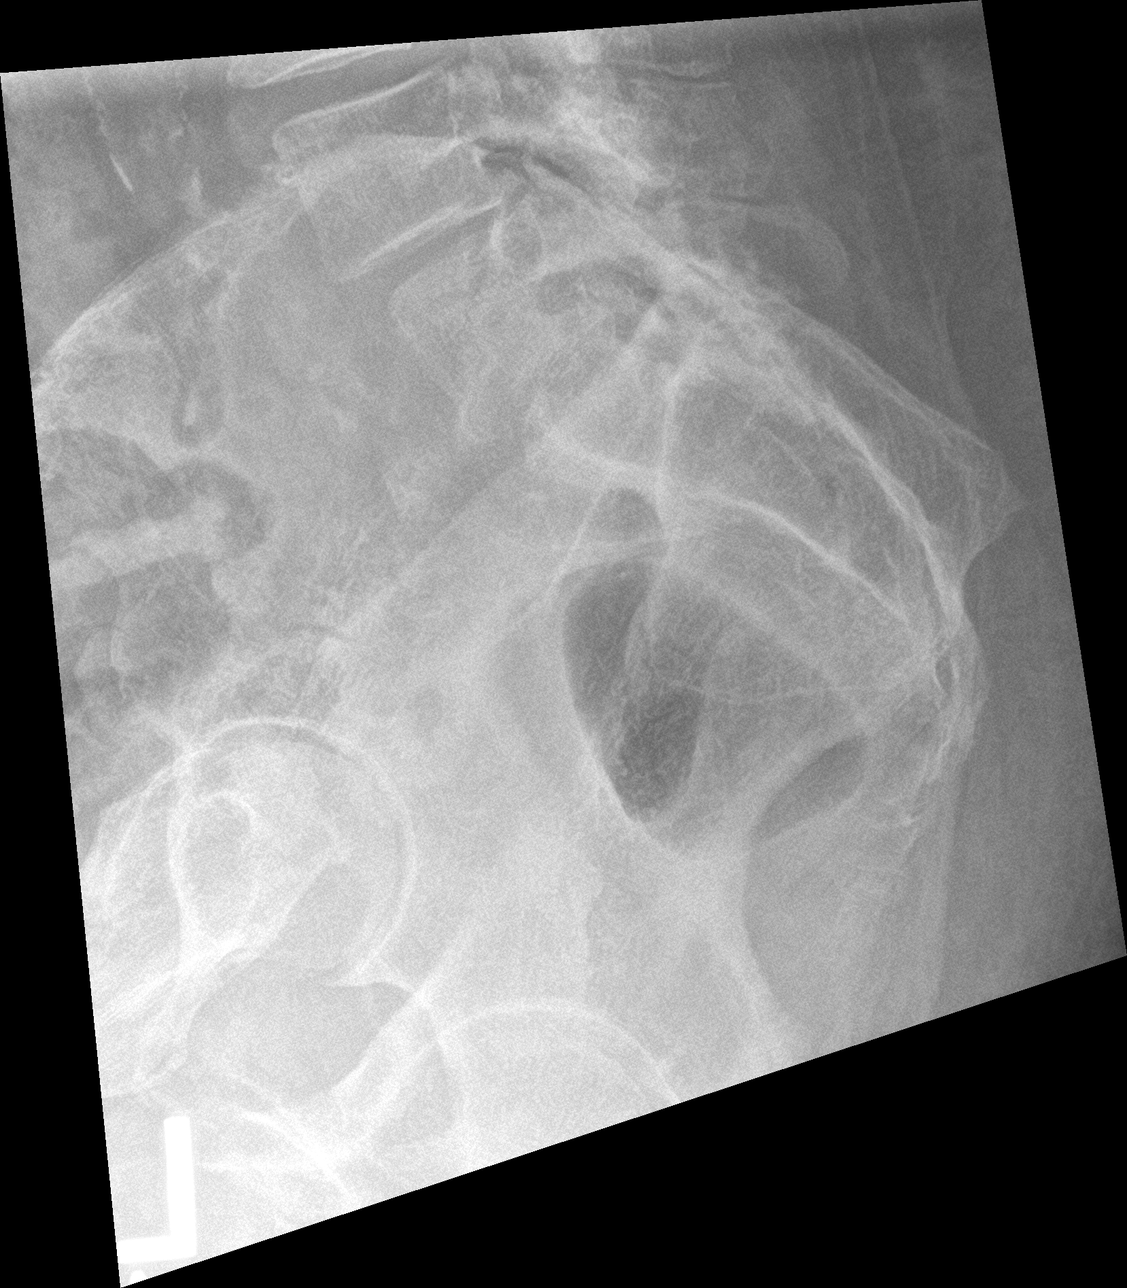

[5 of 5 positions shown; findings below may reference images not displayed]

FINDINGS: No acute fracture. Grade 1 stepwise anterolisthesis of L3 on L4, L4
on L5, and L5 on S1, likely due to severe facet arthropathy. Mild
disc height loss at L3-L4 and L4-L5. Moderate to severe disc height
loss at L5-S1. Vertebral body heights are preserved. The bones are
osteopenic. Atherosclerotic vascular calcifications of the abdominal
aorta.
IMPRESSION: 1. No definite lumbar spine fracture.
2. Stepwise grade 1 anterolisthesis at L3-L4, L4-L5, and L5-S1 due
to severe facet arthropathy.

## 2018-07-20 NOTE — Progress Notes (Signed)
Please let pt know the iron study results, adequate, no need iv iron for now, thanks   Malachy Mood  07/20/2018

## 2018-07-22 ENCOUNTER — Telehealth: Payer: Self-pay

## 2018-07-22 NOTE — Telephone Encounter (Signed)
-----   Message from Malachy Mood, MD sent at 07/20/2018 11:31 AM EDT ----- Please let pt know the iron study results, adequate, no need iv iron for now, thanks   Malachy Mood  07/20/2018

## 2018-07-22 NOTE — Telephone Encounter (Signed)
Spoke with patient per Dr. Mosetta Putt iron study results are adequate, no need for iv iron at presents, patient verbalized an understanding.

## 2018-07-29 ENCOUNTER — Other Ambulatory Visit: Payer: Self-pay | Admitting: Internal Medicine

## 2018-07-29 DIAGNOSIS — Z1231 Encounter for screening mammogram for malignant neoplasm of breast: Secondary | ICD-10-CM

## 2018-08-05 DIAGNOSIS — J449 Chronic obstructive pulmonary disease, unspecified: Secondary | ICD-10-CM | POA: Diagnosis not present

## 2018-08-05 DIAGNOSIS — R0902 Hypoxemia: Secondary | ICD-10-CM | POA: Diagnosis not present

## 2018-08-05 DIAGNOSIS — Z8709 Personal history of other diseases of the respiratory system: Secondary | ICD-10-CM | POA: Diagnosis not present

## 2018-08-05 DIAGNOSIS — Z8739 Personal history of other diseases of the musculoskeletal system and connective tissue: Secondary | ICD-10-CM | POA: Diagnosis not present

## 2018-08-11 DIAGNOSIS — J9600 Acute respiratory failure, unspecified whether with hypoxia or hypercapnia: Secondary | ICD-10-CM | POA: Diagnosis not present

## 2018-08-11 DIAGNOSIS — J189 Pneumonia, unspecified organism: Secondary | ICD-10-CM | POA: Diagnosis not present

## 2018-08-11 DIAGNOSIS — D509 Iron deficiency anemia, unspecified: Secondary | ICD-10-CM | POA: Diagnosis not present

## 2018-08-11 DIAGNOSIS — J45909 Unspecified asthma, uncomplicated: Secondary | ICD-10-CM | POA: Diagnosis not present

## 2018-08-23 ENCOUNTER — Ambulatory Visit
Admission: RE | Admit: 2018-08-23 | Discharge: 2018-08-23 | Disposition: A | Discharge status: Home / Self Care | Payer: Medicare HMO | Source: Ambulatory Visit | Attending: Internal Medicine | Admitting: Internal Medicine

## 2018-08-23 DIAGNOSIS — Z1231 Encounter for screening mammogram for malignant neoplasm of breast: Secondary | ICD-10-CM | POA: Diagnosis not present

## 2018-09-02 ENCOUNTER — Emergency Department (HOSPITAL_COMMUNITY): Payer: Medicare HMO

## 2018-09-02 ENCOUNTER — Other Ambulatory Visit: Payer: Self-pay

## 2018-09-02 ENCOUNTER — Encounter (HOSPITAL_COMMUNITY): Payer: Self-pay

## 2018-09-02 ENCOUNTER — Emergency Department (HOSPITAL_COMMUNITY)
Admission: EM | Admit: 2018-09-02 | Discharge: 2018-09-02 | Disposition: A | Discharge status: Home / Self Care | Payer: Medicare HMO | Attending: Emergency Medicine | Admitting: Emergency Medicine

## 2018-09-02 DIAGNOSIS — J449 Chronic obstructive pulmonary disease, unspecified: Secondary | ICD-10-CM | POA: Insufficient documentation

## 2018-09-02 DIAGNOSIS — N3000 Acute cystitis without hematuria: Secondary | ICD-10-CM | POA: Diagnosis not present

## 2018-09-02 DIAGNOSIS — I10 Essential (primary) hypertension: Secondary | ICD-10-CM | POA: Insufficient documentation

## 2018-09-02 DIAGNOSIS — J410 Simple chronic bronchitis: Secondary | ICD-10-CM | POA: Diagnosis not present

## 2018-09-02 DIAGNOSIS — E119 Type 2 diabetes mellitus without complications: Secondary | ICD-10-CM | POA: Insufficient documentation

## 2018-09-02 DIAGNOSIS — E785 Hyperlipidemia, unspecified: Secondary | ICD-10-CM | POA: Diagnosis not present

## 2018-09-02 DIAGNOSIS — R05 Cough: Secondary | ICD-10-CM | POA: Diagnosis not present

## 2018-09-02 DIAGNOSIS — J029 Acute pharyngitis, unspecified: Secondary | ICD-10-CM | POA: Diagnosis present

## 2018-09-02 LAB — URINALYSIS, ROUTINE W REFLEX MICROSCOPIC
BILIRUBIN URINE: NEGATIVE
Glucose, UA: NEGATIVE mg/dL
Ketones, ur: NEGATIVE mg/dL
Nitrite: NEGATIVE
PROTEIN: 100 mg/dL — AB
SPECIFIC GRAVITY, URINE: 1.015 (ref 1.005–1.030)
pH: 5 (ref 5.0–8.0)

## 2018-09-02 LAB — COMPREHENSIVE METABOLIC PANEL
ALBUMIN: 2.8 g/dL — AB (ref 3.5–5.0)
ALT: 53 U/L — ABNORMAL HIGH (ref 0–44)
ANION GAP: 12 (ref 5–15)
AST: 31 U/L (ref 15–41)
Alkaline Phosphatase: 90 U/L (ref 38–126)
BUN: 50 mg/dL — ABNORMAL HIGH (ref 8–23)
CHLORIDE: 98 mmol/L (ref 98–111)
CO2: 27 mmol/L (ref 22–32)
Calcium: 8.7 mg/dL — ABNORMAL LOW (ref 8.9–10.3)
Creatinine, Ser: 2.88 mg/dL — ABNORMAL HIGH (ref 0.44–1.00)
GFR calc Af Amer: 18 mL/min — ABNORMAL LOW (ref >60)
GFR calc non Af Amer: 16 mL/min — ABNORMAL LOW (ref >60)
GLUCOSE: 103 mg/dL — AB (ref 70–99)
POTASSIUM: 3.4 mmol/L — AB (ref 3.5–5.1)
SODIUM: 137 mmol/L (ref 135–145)
TOTAL PROTEIN: 7.3 g/dL (ref 6.5–8.1)
Total Bilirubin: 0.6 mg/dL (ref 0.3–1.2)

## 2018-09-02 LAB — CBC WITH DIFFERENTIAL/PLATELET
ABS IMMATURE GRANULOCYTES: 0.12 10*3/uL — AB (ref 0.00–0.07)
Basophils Absolute: 0 10*3/uL (ref 0.0–0.1)
Basophils Relative: 0 %
Eosinophils Absolute: 0.1 10*3/uL (ref 0.0–0.5)
Eosinophils Relative: 1 %
HCT: 34 % — ABNORMAL LOW (ref 36.0–46.0)
HEMOGLOBIN: 10.4 g/dL — AB (ref 12.0–15.0)
IMMATURE GRANULOCYTES: 1 %
LYMPHS ABS: 1.1 10*3/uL (ref 0.7–4.0)
Lymphocytes Relative: 10 %
MCH: 30 pg (ref 26.0–34.0)
MCHC: 30.6 g/dL (ref 30.0–36.0)
MCV: 98 fL (ref 80.0–100.0)
MONO ABS: 1.1 10*3/uL — AB (ref 0.1–1.0)
Monocytes Relative: 10 %
NEUTROS ABS: 8.5 10*3/uL — AB (ref 1.7–7.7)
NEUTROS PCT: 78 %
Platelets: 400 10*3/uL (ref 150–400)
RBC: 3.47 MIL/uL — ABNORMAL LOW (ref 3.87–5.11)
RDW: 13.3 % (ref 11.5–15.5)
WBC: 10.9 10*3/uL — ABNORMAL HIGH (ref 4.0–10.5)
nRBC: 0 % (ref 0.0–0.2)

## 2018-09-02 MED ORDER — PREDNISONE 10 MG PO TABS: 50.0000 mg | ORAL_TABLET | Freq: Every day | ORAL | 0 refills | Status: DC

## 2018-09-02 MED ORDER — ACETAMINOPHEN 500 MG PO TABS: 500.0000 mg | ORAL_TABLET | Freq: Four times a day (QID) | ORAL | 0 refills | Status: DC | PRN

## 2018-09-02 MED ORDER — SODIUM CHLORIDE 0.9 % IV SOLN
1.0000 g | Freq: Once | INTRAVENOUS | Status: AC
  Administered 2018-09-02: 1 g via INTRAVENOUS
  Filled 2018-09-02: qty 10

## 2018-09-02 MED ORDER — ONDANSETRON 8 MG PO TBDP: 8.0000 mg | ORAL_TABLET | Freq: Three times a day (TID) | ORAL | 0 refills | Status: DC | PRN

## 2018-09-02 MED ORDER — ACETAMINOPHEN 325 MG PO TABS
650.0000 mg | ORAL_TABLET | Freq: Once | ORAL | Status: AC
  Administered 2018-09-02: 650 mg via ORAL
  Filled 2018-09-02: qty 2

## 2018-09-02 MED ORDER — CEPHALEXIN 500 MG PO CAPS: 500.0000 mg | ORAL_CAPSULE | Freq: Two times a day (BID) | ORAL | 0 refills | Status: DC

## 2018-09-02 NOTE — ED Provider Notes (Addendum)
[facility]-EMERGENCY DEPT Provider Note   CSN: [id number] Arrival date & time: 09/02/18  0118     History   Chief Complaint Chief Complaint  Patient presents with  . Multiple Complaints    HPI Anita Gonzalez is a 72 y.o. female.  HPI 72 year old female with history of COPD, diabetes, hypertension and hyperlipidemia, rheumatoid arthritis comes in with chief complaint of URI-like symptoms and UTI-like symptoms.  Patient reports that he started feeling unwell last week.  She started developing a sore throat and congestion.  Subsequently she had oral thrush which resolved after she gargled peroxide.  Patient also has been having 1 week of UTI-like symptoms that she has been treating with Azo.  She is been having burning with urination and urinary frequency.  Patient is having back pain in the lower back.  She denies any associated vomiting, fevers, diarrhea to me.  Patient does indicate chills and nausea.  Additionally, patient has history of COPD.  She reports that she is been having some URI-like symptoms with congestion and mild cough.  There is no new phlegm and the cough is not significantly different than her normal COPD cough.  She is also complaining of chills and weakness.  Past Medical History:  Diagnosis Date  . Acute kidney injury (HCC) 01/11/2015  . Alcohol abuse, in remission   . Anemia   . Anemia, iron deficiency 01/28/2015  . Arthritis   . Asthma   . Bronchiectasis (HCC) 03/07/2016  . Bronchitis   . COPD (chronic obstructive pulmonary disease) (HCC)   . Diabetes mellitus without complication (HCC)    type 2  . DM II (diabetes mellitus, type II), controlled (HCC) 01/06/2015  . Dyspnea and respiratory abnormality 04/06/2015  . Dysrhythmia    hx of palpitations  . E-coli UTI 01/11/2015  . Essential hypertension 01/06/2015  . GERD (gastroesophageal reflux disease)   . Hay fever   . Hepatitis    c treated with pills  . HLD (hyperlipidemia)  01/06/2015  . Hypertension   . IBS (irritable bowel syndrome)   . ILD (interstitial lung disease) (HCC) 12/11/2016  . Leg cramps, sleep related 12/01/2015  . Pneumonia   . Rheumatoid arthritis (HCC) 1996  . Sinus tachycardia 01/11/2015    Patient Active Problem List   Diagnosis Date Noted  . Bilateral leg pain 07/09/2018  . ILD (interstitial lung disease) (HCC) 12/11/2016  . Bronchiectasis (HCC) 03/07/2016  . Asthma 03/06/2016  . Leg cramps, sleep related 12/01/2015  . OSA (obstructive sleep apnea) 07/20/2015  . Dyspnea and respiratory abnormality 04/06/2015  . Hypoxemia 04/06/2015  . History of rheumatoid arthritis 04/06/2015  . History of smoking 04/06/2015  . Anemia, iron deficiency 01/28/2015  . Acute kidney injury (HCC) 01/11/2015  . E-coli UTI 01/11/2015  . Sinus tachycardia 01/11/2015  . Anemia 01/06/2015  . Essential hypertension 01/06/2015  . DM II (diabetes mellitus, type II), controlled (HCC) 01/06/2015  . HLD (hyperlipidemia) 01/06/2015  . Rheumatoid arthritis (HCC) 01/06/2015    Past Surgical History:  Procedure Laterality Date  . ABDOMINAL HYSTERECTOMY    . BALLOON DILATION N/A 03/15/2018   Procedure: BALLOON DILATION;  Surgeon: Jeani Hawking, MD;  Location: [facility];  Service: Endoscopy;  Laterality: N/A;  . CARPAL TUNNEL RELEASE Left 04/19/2018   Procedure: CARPAL TUNNEL RELEASE;  Surgeon: Coletta Memos, MD;  Location: [facility];  Service: Neurosurgery;  Laterality: Left;  CARPAL TUNNEL RELEASE  . CESAREAN SECTION    . COLONOSCOPY WITH PROPOFOL N/A 04/02/2015  Procedure: COLONOSCOPY WITH PROPOFOL;  Surgeon: Jeani Hawking, MD;  Location: [facility];  Service: Endoscopy;  Laterality: N/A;  . ESOPHAGOGASTRODUODENOSCOPY (EGD) WITH PROPOFOL N/A 04/02/2015   Procedure: ESOPHAGOGASTRODUODENOSCOPY (EGD) WITH PROPOFOL;  Surgeon: Jeani Hawking, MD;  Location: [facility];  Service: Endoscopy;  Laterality: N/A;  . ESOPHAGOGASTRODUODENOSCOPY (EGD) WITH PROPOFOL N/A 03/15/2018     Procedure: ESOPHAGOGASTRODUODENOSCOPY (EGD) WITH PROPOFOL;  Surgeon: Jeani Hawking, MD;  Location: [facility];  Service: Endoscopy;  Laterality: N/A;  . HEMORRHOID SURGERY    . HERNIA REPAIR    . HERNIA REPAIR       OB History   None      Home Medications    Prior to Admission medications   Medication Sig Start Date End Date Taking? Authorizing Provider  acetaminophen-codeine (TYLENOL #3) 300-30 MG tablet Take 1 tablet by mouth every 6 (six) hours as needed for moderate pain. 04/19/18  Yes Coletta Memos, MD  albuterol (PROVENTIL HFA;VENTOLIN HFA) 108 (90 BASE) MCG/ACT inhaler Inhale 2 puffs into the lungs every 6 (six) hours as needed for wheezing or shortness of breath (wheezing).    Yes [provider]  atorvastatin (LIPITOR) 40 MG tablet Take 1 tablet (40 mg total) by mouth daily. Please keep appt for future refills thank you. Patient taking differently: Take 40 mg by mouth daily.  05/07/18  Yes Pricilla Riffle, MD  bumetanide (BUMEX) 2 MG tablet Take 0.5 mg by mouth every other day.    Yes [provider]  Colloidal Oatmeal (GOLD BOND ECZEMA RELIEF) 2 % CREA Apply 1 application topically 3 (three) times daily as needed (Rash).   Yes [provider]  cyanocobalamin 1000 MCG tablet Take 1,000 mcg by mouth every Thursday.    Yes [provider]  cyclobenzaprine (FLEXERIL) 10 MG tablet Take 10 mg by mouth at bedtime.    Yes [provider]  diclofenac sodium (VOLTAREN) 1 % GEL Apply 1 application topically 4 (four) times daily as needed (for pain).    Yes [provider]  diphenoxylate-atropine (LOMOTIL) 2.5-0.025 MG tablet Take 1 tablet by mouth 4 (four) times daily as needed for diarrhea or loose stools.    Yes [provider]  docusate sodium (COLACE) 100 MG capsule Take 100 mg by mouth 2 (two) times daily as needed for mild constipation.   Yes [provider]  ergocalciferol (VITAMIN D2) 50000 UNITS capsule Take  50,000 Units by mouth every Monday.    Yes [provider]  ezetimibe (ZETIA) 10 MG tablet Take 1 tablet (10 mg total) by mouth daily. Please keep upcoming appt with Dr. Tenny Craw. Thank you. Patient taking differently: Take 10 mg by mouth daily.  05/02/18  Yes Pricilla Riffle, MD  ferrous sulfate 325 (65 FE) MG tablet Take 325 mg by mouth 2 (two) times daily with a meal.    Yes [provider]  fluticasone (FLONASE) 50 MCG/ACT nasal spray Place 1 spray into both nostrils daily as needed for allergies or rhinitis.   Yes [provider]  folic acid (FOLVITE) 1 MG tablet Take 1 tablet (1 mg total) by mouth daily. Please schedule an appointment for more refills, thanks! [phone]. 1st attempt Patient taking differently: Take 1 mg by mouth daily.  02/20/18  Yes Pricilla Riffle, MD  ipratropium-albuterol (DUONEB) 0.5-2.5 (3) MG/3ML SOLN Inhale 3 mLs into the lungs every 6 (six) hours as needed (for shortness of breath).  10/08/15  Yes [provider]  losartan (COZAAR) 50 MG  tablet Take 50 mg by mouth daily. 03/11/18  Yes [provider]  metFORMIN (GLUCOPHAGE) 500 MG tablet Take 500 mg by mouth 2 (two) times daily. 05/05/15  Yes [provider]  montelukast (SINGULAIR) 10 MG tablet Take 10 mg by mouth at bedtime.   Yes [provider]  pantoprazole (PROTONIX) 40 MG tablet Take 40 mg by mouth daily.  07/21/17  Yes [provider]  potassium chloride (K-DUR) 10 MEQ tablet TAKE 1 TABLET BY MOUTH EVERY OTHER DAY Patient taking differently: Take 10 mEq by mouth every other day.  06/07/18  Yes Pricilla Riffle, MD  SYMBICORT 160-4.5 MCG/ACT inhaler INHALE 2 PUFFS BY MOUTH INTO THE LUNG 2 TIMES A DAY Patient taking differently: Inhale 2 puffs into the lungs 2 (two) times daily.  01/01/18  Yes Kalman Shan, MD  verapamil (VERELAN PM) 180 MG 24 hr capsule Take 180 mg by mouth 2 (two) times daily. 02/19/18  Yes [provider]  acetaminophen  (TYLENOL) 500 MG tablet Take 1 tablet (500 mg total) by mouth every 6 (six) hours as needed. 09/02/18   Derwood Kaplan, MD  cephALEXin (KEFLEX) 500 MG capsule Take 1 capsule (500 mg total) by mouth 2 (two) times daily. 09/02/18   Derwood Kaplan, MD  ondansetron (ZOFRAN ODT) 8 MG disintegrating tablet Take 1 tablet (8 mg total) by mouth every 8 (eight) hours as needed for nausea. 09/02/18   Derwood Kaplan, MD  predniSONE (DELTASONE) 10 MG tablet Take 5 tablets (50 mg total) by mouth daily. 09/02/18   Derwood Kaplan, MD    Family History Family History  Problem Relation Age of Onset  . Diabetes Mother   . Heart failure Mother   . Throat cancer Brother        throat cancer   . Asthma Other   . COPD Other   . Hypertension Other   . Stroke Other   . Heart disease Other     Social History Social History   Tobacco Use  . Smoking status: Former Smoker    Packs/day: 0.75    Years: 25.00    Pack years: 18.75    Types: Cigarettes    Last attempt to quit: 01/12/2011    Years since quitting: 7.6  . Smokeless tobacco: Never Used  Substance Use Topics  . Alcohol use: Yes    Alcohol/week: 4.0 standard drinks    Types: 4 Cans of beer per week    Comment: heavy drinker in the past  . Drug use: No     Allergies   Fish allergy; Peanuts [peanut oil]; Penicillins; Iodinated diagnostic agents; Iodine; Percocet [oxycodone-acetaminophen]; Other; Sulfa antibiotics; Hydrocodone; and Oxycodone   Review of Systems Review of Systems  Constitutional: Positive for activity change.  HENT: Positive for congestion, rhinorrhea and sore throat.   Respiratory: Positive for cough and wheezing.   Cardiovascular: Negative for chest pain.  Gastrointestinal: Positive for nausea.  Genitourinary: Positive for dysuria. Negative for flank pain.  Musculoskeletal: Positive for back pain.  Hematological: Does not bruise/bleed easily.  All other systems reviewed and are negative.    Physical Exam Updated  Vital Signs BP (!) 109/46   Pulse 84   Temp 98.1 F (36.7 C) (Oral)   Resp 16   Ht 5\' 1"  (1.549 m)   Wt 101.2 kg   SpO2 100%   BMI 42.14 kg/m   Physical Exam  Constitutional: She is oriented to person, place, and time. She appears well-developed.  HENT:  Head:  Normocephalic and atraumatic.  Mouth/Throat: Oropharynx is clear and moist. No oropharyngeal exudate.  Eyes: EOM are normal.  Neck: Normal range of motion. Neck supple.  Cardiovascular: Normal rate.  Pulmonary/Chest: Effort normal.  Abdominal: Soft. Bowel sounds are normal. There is tenderness. There is no rebound and no guarding.  Mild tenderness in the right lower and suprapubic region of the abdomen. Patient does not have any flank tenderness.   Lymphadenopathy:    She has cervical adenopathy.  Neurological: She is alert and oriented to person, place, and time.  Skin: Skin is warm and dry.  Nursing note and vitals reviewed.    ED Treatments / Results  Labs (all labs ordered are listed, but only abnormal results are displayed) Labs Reviewed  COMPREHENSIVE METABOLIC PANEL - Abnormal; Notable for the following components:      Result Value   Potassium 3.4 (*)    Glucose, Bld 103 (*)    BUN 50 (*)    Creatinine, Ser 2.88 (*)    Calcium 8.7 (*)    Albumin 2.8 (*)    ALT 53 (*)    GFR calc non Af Amer 16 (*)    GFR calc Af Amer 18 (*)    All other components within normal limits  CBC WITH DIFFERENTIAL/PLATELET - Abnormal; Notable for the following components:   WBC 10.9 (*)    RBC 3.47 (*)    Hemoglobin 10.4 (*)    HCT 34.0 (*)    Neutro Abs 8.5 (*)    Monocytes Absolute 1.1 (*)    Abs Immature Granulocytes 0.12 (*)    All other components within normal limits  URINALYSIS, ROUTINE W REFLEX MICROSCOPIC - Abnormal; Notable for the following components:   APPearance TURBID (*)    Hgb urine dipstick SMALL (*)    Protein, ur 100 (*)    Leukocytes, UA LARGE (*)    WBC, UA >50 (*)    Bacteria, UA MANY (*)      Non Squamous Epithelial 6-10 (*)    All other components within normal limits  URINE CULTURE    EKG None  Radiology Dg Chest 2 View  Result Date: 09/02/2018 CLINICAL DATA:  Cough EXAM: CHEST - 2 VIEW COMPARISON:  Chest radiograph 07/17/2017 FINDINGS: There is shallow lung inflation. The cardiomediastinal contours are normal. There is no focal airspace consolidation or pulmonary edema. There is no pleural effusion or pneumothorax. IMPRESSION: Shallow lung inflation without acute airspace disease. Electronically Signed   By: Deatra Robinson M.D.   On: 09/02/2018 03:51    Procedures Procedures (including critical care time)  Medications Ordered in ED Medications  acetaminophen (TYLENOL) tablet 650 mg (has no administration in time range)  cefTRIAXone (ROCEPHIN) 1 g in sodium chloride 0.9 % 100 mL IVPB (0 g Intravenous Stopped 09/02/18 0732)     Initial Impression / Assessment and Plan / ED Course  I have reviewed the triage vital signs and the nursing notes.  Pertinent labs & imaging results that were available during my care of the patient were reviewed by me and considered in my medical decision making (see chart for details).  Clinical Course as of Sep 03 751  Mon Sep 02, 2018  6286 Urine analysis appears to be confirming the suspected UTI. Patient had received ceftriaxone during her admission in February, 2016 without any complication.  This was confirmed with the pharmacist.  Therefore despite patient having anaphylaxis type reaction to penicillin we are comfortable giving her Rocephin IV in the  ED.  Leukocytes, UA(!): LARGE [AN]  0654 Results from the ER workup discussed with the patient face to face and all questions answered to the best of my ability.    [AN]    Clinical Course User Index [AN] Derwood Kaplan, MD    72 year old female comes in with URI and UTI-like symptoms.  She is also been having associated generalized weakness and chills. She has history of  COPD.  Lungs are clear right now and it appears that patient's cough is chronic in nature without any new change in phlegm. Her throat exam is normal and her abdominal exam is not peritoneal.  Patient is having UTI-like symptoms, and it appears that her back pain is likely because of her cystitis.  Pain is not in the flank region, and I doubt that she has acute pyelonephritis based on clinical evaluation.  X-ray along with basic labs and urinalysis ordered.  Final Clinical Impressions(s) / ED Diagnoses   Final diagnoses:  Acute cystitis without hematuria  Simple chronic bronchitis [facility])    ED Discharge Orders         Ordered    cephALEXin (KEFLEX) 500 MG capsule  2 times daily     09/02/18 0747    ondansetron (ZOFRAN ODT) 8 MG disintegrating tablet  Every 8 hours PRN     09/02/18 0747    acetaminophen (TYLENOL) 500 MG tablet  Every 6 hours PRN     09/02/18 0747    predniSONE (DELTASONE) 10 MG tablet  Daily     09/02/18 0750           Derwood Kaplan, MD 09/02/18 1610    Derwood Kaplan, MD 09/02/18 9604    Derwood Kaplan, MD 09/02/18 [phone]

## 2018-09-02 NOTE — Discharge Instructions (Signed)
Results in the ER indicate that you are having a urinary tract infection. Please take the medications prescribed for the infection and for the nausea. You also might be having a mild COPD exacerbation -and therefore we are sending you home with prednisone.  The prednisone will get your blood sugar elevated for the few days.  See your primary care doctor in 1 week. Please return to the ER if your symptoms worsen; you have increased pain, fevers, chills, inability to keep any medications down, confusion. Otherwise see the outpatient doctor as requested.

## 2018-09-02 NOTE — ED Notes (Signed)
Patient given discharge teaching and verbalized understanding. Patient taken out of ED with a wheelchair. Patient VS WNL.

## 2018-09-02 NOTE — ED Triage Notes (Signed)
Pt reports several complaints. She reports that she thinks that she has a sinus infection, sorethroat, cough, palpitations, UTI, loss of appetite, chills, fever, lightheadedness, intermittent diarrhea. She states that everything started on Tuesday. She endorses every symptom that she was asked about.  When she coughs, she states that her phlegm is gray or yellow. She states that her urine is "strong smelling" and she is experiencing burning with urination. Pt also HOH. No acute distress.  A&Ox4.

## 2018-09-04 DIAGNOSIS — R0902 Hypoxemia: Secondary | ICD-10-CM | POA: Diagnosis not present

## 2018-09-04 DIAGNOSIS — J449 Chronic obstructive pulmonary disease, unspecified: Secondary | ICD-10-CM | POA: Diagnosis not present

## 2018-09-04 DIAGNOSIS — Z8709 Personal history of other diseases of the respiratory system: Secondary | ICD-10-CM | POA: Diagnosis not present

## 2018-09-04 DIAGNOSIS — Z8739 Personal history of other diseases of the musculoskeletal system and connective tissue: Secondary | ICD-10-CM | POA: Diagnosis not present

## 2018-09-04 LAB — URINE CULTURE

## 2018-09-05 ENCOUNTER — Telehealth: Payer: Self-pay | Admitting: *Deleted

## 2018-09-05 NOTE — Telephone Encounter (Signed)
Post ED Visit - Positive Culture Follow-up  Culture report reviewed by antimicrobial stewardship pharmacist:  []  , Pharm.D. []  Enzo Bi, Pharm.D., BCPS AQ-ID []  , Pharm.D., BCPS []  Celedonio Miyamoto, Pharm.D., BCPS []  [place], Garvin Fila.D., BCPS, AAHIVP []  , Pharm.D., BCPS, AAHIVP []  Georgina Pillion, PharmD, BCPS []  , PharmD, BCPS []  [place], PharmD, BCPS []  [address], PharmD , PharmD   Positive urine culture Treated with Cephalexin, organism sensitive to the same and no further patient follow-up is required at this time.  Estella Husk [facility] 09/05/2018, 10:39 AM

## 2018-09-10 DIAGNOSIS — J9600 Acute respiratory failure, unspecified whether with hypoxia or hypercapnia: Secondary | ICD-10-CM | POA: Diagnosis not present

## 2018-09-10 DIAGNOSIS — J189 Pneumonia, unspecified organism: Secondary | ICD-10-CM | POA: Diagnosis not present

## 2018-09-10 DIAGNOSIS — J45909 Unspecified asthma, uncomplicated: Secondary | ICD-10-CM | POA: Diagnosis not present

## 2018-09-10 DIAGNOSIS — D509 Iron deficiency anemia, unspecified: Secondary | ICD-10-CM | POA: Diagnosis not present

## 2018-09-12 ENCOUNTER — Other Ambulatory Visit: Payer: Self-pay | Admitting: Internal Medicine

## 2018-09-12 NOTE — Telephone Encounter (Signed)
Dr Tenny Craw has never filled this medication for the patient. Okay to refill? Please advise. Thanks, MI

## 2018-09-13 ENCOUNTER — Other Ambulatory Visit: Payer: Self-pay | Admitting: Internal Medicine

## 2018-09-17 ENCOUNTER — Telehealth: Payer: Self-pay | Admitting: Cardiovascular Disease

## 2018-09-17 NOTE — Telephone Encounter (Signed)
New Message   Patient c/o Palpitations:  High priority if patient c/o lightheadedness, shortness of breath, or chest pain  1) How long have you had palpitations/irregular HR/ Afib? Are you having the symptoms now? irregular heart beat since last ov  2) Are you currently experiencing lightheadedness, SOB or CP? DOB  3) Do you have a history of afib (atrial fibrillation) or irregular heart rhythm? yes  4) Have you checked your BP or HR? (document readings if available): no  5) Are you experiencing any other symptoms?

## 2018-09-17 NOTE — Telephone Encounter (Signed)
Call was transferred to me. Pt is asking for an appointment because of "irregular heart beat and SOB on exertion for the last couple months.  Pt states this was present at last ov with Dr. Tenny Craw and eased off but lately has worsened again.  She feels her heart pounding irregularly when she exerts herself.  She does not have recent blood pressure or HR readings.  Does not report chest pain. Offered appointment tomorrow but pt needs to give a 3 day notice to transportation company.  Offered her 8:00 am on 11/11 but it is too early for her. Called pt back and left VM that I have placed an 11:30 am spot on hold for her and that she needs to call back to office to confirm that she can come.

## 2018-09-17 NOTE — Telephone Encounter (Signed)
New message   Patient returning call , triage called and hung up , transferred call to [facility]

## 2018-09-19 ENCOUNTER — Other Ambulatory Visit: Payer: Self-pay | Admitting: Internal Medicine

## 2018-09-20 ENCOUNTER — Other Ambulatory Visit: Payer: Self-pay | Admitting: Internal Medicine

## 2018-09-20 NOTE — Telephone Encounter (Signed)
Follow up    (FYI): Patient is calling back today in reference to appt. She was not able to come in on 11/11 and see [place]. Due to transportation issues she scheduled for 12/11 with Tereso Newcomer.

## 2018-09-25 ENCOUNTER — Other Ambulatory Visit: Payer: Self-pay | Admitting: Internal Medicine

## 2018-09-25 NOTE — Telephone Encounter (Signed)
OK to refill. Pt was on it when she saw Nada Boozer in clinic

## 2018-09-26 ENCOUNTER — Other Ambulatory Visit: Payer: Self-pay | Admitting: Internal Medicine

## 2018-09-26 NOTE — Telephone Encounter (Signed)
The request says the last refill was 09/19/18.  Not appropriate to refill at this time.

## 2018-09-26 NOTE — Telephone Encounter (Signed)
Please advise on refill request. Thanks, MI 

## 2018-09-27 ENCOUNTER — Other Ambulatory Visit (HOSPITAL_COMMUNITY): Payer: Self-pay | Admitting: Family Medicine

## 2018-09-27 DIAGNOSIS — N183 Chronic kidney disease, stage 3 unspecified: Secondary | ICD-10-CM

## 2018-10-03 ENCOUNTER — Other Ambulatory Visit: Payer: Self-pay | Admitting: Internal Medicine

## 2018-10-04 ENCOUNTER — Other Ambulatory Visit (HOSPITAL_COMMUNITY): Payer: Self-pay | Admitting: Family Medicine

## 2018-10-04 ENCOUNTER — Ambulatory Visit (HOSPITAL_COMMUNITY): Admission: RE | Admit: 2018-10-04 | Payer: Medicare HMO | Source: Ambulatory Visit

## 2018-10-04 ENCOUNTER — Encounter (HOSPITAL_COMMUNITY): Payer: Self-pay

## 2018-10-04 ENCOUNTER — Ambulatory Visit (HOSPITAL_COMMUNITY)
Admission: RE | Admit: 2018-10-04 | Discharge: 2018-10-04 | Disposition: A | Discharge status: Home / Self Care | Payer: Medicare HMO | Source: Ambulatory Visit | Attending: Family Medicine | Admitting: Family Medicine

## 2018-10-04 DIAGNOSIS — N183 Chronic kidney disease, stage 3 unspecified: Secondary | ICD-10-CM

## 2018-10-05 DIAGNOSIS — J449 Chronic obstructive pulmonary disease, unspecified: Secondary | ICD-10-CM | POA: Diagnosis not present

## 2018-10-05 DIAGNOSIS — Z8739 Personal history of other diseases of the musculoskeletal system and connective tissue: Secondary | ICD-10-CM | POA: Diagnosis not present

## 2018-10-05 DIAGNOSIS — Z8709 Personal history of other diseases of the respiratory system: Secondary | ICD-10-CM | POA: Diagnosis not present

## 2018-10-05 DIAGNOSIS — R0902 Hypoxemia: Secondary | ICD-10-CM | POA: Diagnosis not present

## 2018-10-07 DIAGNOSIS — E1159 Type 2 diabetes mellitus with other circulatory complications: Secondary | ICD-10-CM | POA: Diagnosis not present

## 2018-10-07 DIAGNOSIS — I10 Essential (primary) hypertension: Secondary | ICD-10-CM | POA: Diagnosis not present

## 2018-10-07 DIAGNOSIS — N183 Chronic kidney disease, stage 3 (moderate): Secondary | ICD-10-CM | POA: Diagnosis not present

## 2018-10-07 DIAGNOSIS — E782 Mixed hyperlipidemia: Secondary | ICD-10-CM | POA: Diagnosis not present

## 2018-10-11 DIAGNOSIS — D509 Iron deficiency anemia, unspecified: Secondary | ICD-10-CM | POA: Diagnosis not present

## 2018-10-11 DIAGNOSIS — J9600 Acute respiratory failure, unspecified whether with hypoxia or hypercapnia: Secondary | ICD-10-CM | POA: Diagnosis not present

## 2018-10-11 DIAGNOSIS — J189 Pneumonia, unspecified organism: Secondary | ICD-10-CM | POA: Diagnosis not present

## 2018-10-11 DIAGNOSIS — J45909 Unspecified asthma, uncomplicated: Secondary | ICD-10-CM | POA: Diagnosis not present

## 2018-10-16 DIAGNOSIS — K219 Gastro-esophageal reflux disease without esophagitis: Secondary | ICD-10-CM | POA: Diagnosis not present

## 2018-10-16 DIAGNOSIS — R197 Diarrhea, unspecified: Secondary | ICD-10-CM | POA: Diagnosis not present

## 2018-10-17 ENCOUNTER — Inpatient Hospital Stay: Payer: Medicare HMO | Attending: Hematology

## 2018-10-22 DIAGNOSIS — K740 Hepatic fibrosis: Secondary | ICD-10-CM | POA: Diagnosis not present

## 2018-10-23 ENCOUNTER — Ambulatory Visit: Payer: Medicare HMO | Admitting: Physician Assistant

## 2018-10-30 DIAGNOSIS — G8929 Other chronic pain: Secondary | ICD-10-CM | POA: Diagnosis not present

## 2018-10-30 DIAGNOSIS — E1129 Type 2 diabetes mellitus with other diabetic kidney complication: Secondary | ICD-10-CM | POA: Diagnosis not present

## 2018-10-30 DIAGNOSIS — J45909 Unspecified asthma, uncomplicated: Secondary | ICD-10-CM | POA: Diagnosis not present

## 2018-10-30 DIAGNOSIS — E785 Hyperlipidemia, unspecified: Secondary | ICD-10-CM | POA: Diagnosis not present

## 2018-10-30 DIAGNOSIS — R1013 Epigastric pain: Secondary | ICD-10-CM | POA: Diagnosis not present

## 2018-10-30 DIAGNOSIS — B1920 Unspecified viral hepatitis C without hepatic coma: Secondary | ICD-10-CM | POA: Diagnosis not present

## 2018-10-30 DIAGNOSIS — G4733 Obstructive sleep apnea (adult) (pediatric): Secondary | ICD-10-CM | POA: Diagnosis not present

## 2018-10-30 DIAGNOSIS — E559 Vitamin D deficiency, unspecified: Secondary | ICD-10-CM | POA: Diagnosis not present

## 2018-10-30 DIAGNOSIS — I119 Hypertensive heart disease without heart failure: Secondary | ICD-10-CM | POA: Diagnosis not present

## 2018-11-04 ENCOUNTER — Other Ambulatory Visit: Payer: Self-pay | Admitting: Internal Medicine

## 2018-11-04 DIAGNOSIS — J449 Chronic obstructive pulmonary disease, unspecified: Secondary | ICD-10-CM | POA: Diagnosis not present

## 2018-11-04 DIAGNOSIS — Z8709 Personal history of other diseases of the respiratory system: Secondary | ICD-10-CM | POA: Diagnosis not present

## 2018-11-04 DIAGNOSIS — Z8739 Personal history of other diseases of the musculoskeletal system and connective tissue: Secondary | ICD-10-CM | POA: Diagnosis not present

## 2018-11-04 DIAGNOSIS — R0902 Hypoxemia: Secondary | ICD-10-CM | POA: Diagnosis not present

## 2018-11-10 DIAGNOSIS — J189 Pneumonia, unspecified organism: Secondary | ICD-10-CM | POA: Diagnosis not present

## 2018-11-10 DIAGNOSIS — D509 Iron deficiency anemia, unspecified: Secondary | ICD-10-CM | POA: Diagnosis not present

## 2018-11-10 DIAGNOSIS — J45909 Unspecified asthma, uncomplicated: Secondary | ICD-10-CM | POA: Diagnosis not present

## 2018-11-10 DIAGNOSIS — J9600 Acute respiratory failure, unspecified whether with hypoxia or hypercapnia: Secondary | ICD-10-CM | POA: Diagnosis not present

## 2018-11-20 ENCOUNTER — Ambulatory Visit: Payer: Medicare HMO | Admitting: Physician Assistant

## 2018-11-20 NOTE — Progress Notes (Deleted)
Cardiology Office Note:    Date:  11/20/2018   ID:  Anita Gonzalez, DOB [birth year], MRN [id number]  PCP:  [name], MD  Cardiologist:  No primary care provider on file. *** Electrophysiologist:  None  Pulmonologist:  Dr. Marchelle Gearing Hematologist:  Dr. Malachy Mood Neurologist:  Dr. Marjory Lies  Referring MD: [name], MD   No chief complaint on file. ***  History of Present Illness:    Anita Gonzalez is a 73 y.o. female with coronary calcification noted on prior chest CT, HTN, HLP, DM, asthma, bronchiectasis, anemia related to AVM bleeding.  Myoview in 3/18 was low risk and neg for ischemia.  Echo in 2016 demonstrated normal EF. Last seen by Dr. Tenny Craw in 4/18.  She saw Nada Boozer, NP in 7/19.  She was in a junctional rhythm and her beta-blocker was stopped.  A follow up EKG demonstrated NSR.  She was seen by Dr. Allyson Sabal for PV eval in 8/19.  ABIs were normal.  ***  Anita Gonzalez ***  Prior CV studies:   The following studies were reviewed today:  ABIs 05/24/18 Final Interpretation: Right: Resting right ankle-brachial index is within normal range. No evidence of significant right lower extremity arterial disease. The right toe-brachial index is normal. Left: Resting left ankle-brachial index is within normal range. No evidence of significant left lower extremity arterial disease. The left toe-brachial index is normal.  Myoview 01/31/17 EF 66, small mid ant perfusion defect - prob soft tissue attenuation; no ischemia - Low Risk  24 Hr Holter 08/01/16 Sinus rhythm  33 to 121 bpm  Average HR 65 bpm  Occasional PVCs, PACs Symptoms of slow, fast HR or SOB did not correlate with arrhythmia  Carotid US 05/20/15 Bilat ICA 1-39  Echo 04/13/15 EF 60-65, no RWMA, Gr 1 DD, mild LAE  Past Medical History:  Diagnosis Date  . Acute kidney injury (HCC) 01/11/2015  . Alcohol abuse, in remission   . Anemia   . Anemia, iron deficiency 01/28/2015  . Arthritis   . Asthma   . Bronchiectasis  (HCC) 03/07/2016  . Bronchitis   . COPD (chronic obstructive pulmonary disease) (HCC)   . Diabetes mellitus without complication (HCC)    type 2  . DM II (diabetes mellitus, type II), controlled (HCC) 01/06/2015  . Dyspnea and respiratory abnormality 04/06/2015  . Dysrhythmia    hx of palpitations  . E-coli UTI 01/11/2015  . Essential hypertension 01/06/2015  . GERD (gastroesophageal reflux disease)   . Hay fever   . Hepatitis    c treated with pills  . HLD (hyperlipidemia) 01/06/2015  . Hypertension   . IBS (irritable bowel syndrome)   . ILD (interstitial lung disease) (HCC) 12/11/2016  . Leg cramps, sleep related 12/01/2015  . Pneumonia   . Rheumatoid arthritis (HCC) 1996  . Sinus tachycardia 01/11/2015   Surgical Hx: The patient  has a past surgical history that includes Hernia repair; Hemorrhoid surgery; Cesarean section; Hernia repair; Abdominal hysterectomy; Esophagogastroduodenoscopy (egd) with propofol (N/A, 04/02/2015); Colonoscopy with propofol (N/A, 04/02/2015); Esophagogastroduodenoscopy (egd) with propofol (N/A, 03/15/2018); Balloon dilation (N/A, 03/15/2018); and Carpal tunnel release (Left, 04/19/2018).   Current Medications: No outpatient medications have been marked as taking for the 11/20/18 encounter (Appointment) with Tereso Newcomer T, PA-C.     Allergies:   Fish allergy; Peanuts [peanut oil]; Penicillins; Iodinated diagnostic agents; Iodine; Percocet [oxycodone-acetaminophen]; Other; Sulfa antibiotics; Hydrocodone; and Oxycodone   Social History   Tobacco Use  . Smoking status: Former Smoker  Packs/day: 0.75    Years: 25.00    Pack years: 18.75    Types: Cigarettes    Last attempt to quit: 01/12/2011    Years since quitting: 7.8  . Smokeless tobacco: Never Used  Substance Use Topics  . Alcohol use: Yes    Alcohol/week: 4.0 standard drinks    Types: 4 Cans of beer per week    Comment: heavy drinker in the past  . Drug use: No     Family Hx: The patient's family  history includes Asthma in an other family member; COPD in an other family member; Diabetes in her mother; Heart disease in an other family member; Heart failure in her mother; Hypertension in an other family member; Stroke in an other family member; Throat cancer in her brother.  ROS:   Please see the history of present illness.    ROS All other systems reviewed and are negative.   EKGs/Labs/Other Test Reviewed:    EKG:  EKG is *** ordered today.  The ekg ordered today demonstrates ***  Recent Labs: 09/02/2018: ALT 53; BUN 50; Creatinine, Ser 2.88; Hemoglobin 10.4; Platelets 400; Potassium 3.4; Sodium 137   Recent Lipid Panel Lab Results  Component Value Date/Time   CHOL 161 02/23/2017 04:42 PM   TRIG 106 02/23/2017 04:42 PM   HDL 48 02/23/2017 04:42 PM   CHOLHDL 3.4 02/23/2017 04:42 PM   CHOLHDL 3 05/07/2015 12:19 PM   LDLCALC 92 02/23/2017 04:42 PM    Physical Exam:    VS:  There were no vitals taken for this visit.    Wt Readings from Last 3 Encounters:  09/02/18 223 lb (101.2 kg)  07/18/18 223 lb 9.6 oz (101.4 kg)  07/09/18 225 lb 6.4 oz (102.2 kg)     ***Physical Exam  ASSESSMENT & PLAN:    No diagnosis found.***  Dispo:  No follow-ups on file.   Medication Adjustments/Labs and Tests Ordered: Current medicines are reviewed at length with the patient today.  Concerns regarding medicines are outlined above.  Tests Ordered: No orders of the defined types were placed in this encounter.  Medication Changes: No orders of the defined types were placed in this encounter.   Signed, Tereso Newcomer, PA-C  11/20/2018 1:45 PM    [facility] [address] [place], [place], [place]  [address] Phone: [phone]; Fax: [phone]

## 2018-11-21 ENCOUNTER — Encounter: Payer: Self-pay | Admitting: Physician Assistant

## 2018-12-02 ENCOUNTER — Other Ambulatory Visit: Payer: Self-pay | Admitting: Internal Medicine

## 2018-12-05 DIAGNOSIS — Z8709 Personal history of other diseases of the respiratory system: Secondary | ICD-10-CM | POA: Diagnosis not present

## 2018-12-05 DIAGNOSIS — R0902 Hypoxemia: Secondary | ICD-10-CM | POA: Diagnosis not present

## 2018-12-05 DIAGNOSIS — J449 Chronic obstructive pulmonary disease, unspecified: Secondary | ICD-10-CM | POA: Diagnosis not present

## 2018-12-05 DIAGNOSIS — Z8739 Personal history of other diseases of the musculoskeletal system and connective tissue: Secondary | ICD-10-CM | POA: Diagnosis not present

## 2018-12-10 ENCOUNTER — Encounter (INDEPENDENT_AMBULATORY_CARE_PROVIDER_SITE_OTHER): Payer: Self-pay

## 2018-12-10 ENCOUNTER — Ambulatory Visit (INDEPENDENT_AMBULATORY_CARE_PROVIDER_SITE_OTHER): Payer: Medicare HMO | Admitting: Physician Assistant

## 2018-12-10 ENCOUNTER — Encounter: Payer: Self-pay | Admitting: Physician Assistant

## 2018-12-10 ENCOUNTER — Telehealth: Payer: Self-pay | Admitting: Physician Assistant

## 2018-12-10 VITALS — BP 118/60 | HR 72 | Ht 61.0 in | Wt 208.1 lb

## 2018-12-10 DIAGNOSIS — E782 Mixed hyperlipidemia: Secondary | ICD-10-CM | POA: Diagnosis not present

## 2018-12-10 DIAGNOSIS — I498 Other specified cardiac arrhythmias: Secondary | ICD-10-CM | POA: Diagnosis not present

## 2018-12-10 DIAGNOSIS — I2510 Atherosclerotic heart disease of native coronary artery without angina pectoris: Secondary | ICD-10-CM | POA: Diagnosis not present

## 2018-12-10 DIAGNOSIS — I10 Essential (primary) hypertension: Secondary | ICD-10-CM

## 2018-12-10 NOTE — Progress Notes (Signed)
Cardiology Office Note:    Date:  12/10/2018   ID:  Benita Stabile Boyden, DOB 1946-02-29, MRN [id number]  PCP:  [name], MD  Cardiologist:  Dietrich Pates, MD   Electrophysiologist:  None   Referring MD: [name], MD   Chief Complaint  Patient presents with  . Follow-up    CAD     History of Present Illness:    Anita Gonzalez is a 73 y.o. female with coronary artery disease (noted on prior chest CT), diabetes, hypertension, hyperlipidemia, COPD.  Nuclear stress test in 2018 demonstrated no ischemia.  She was last seen in clinic in July 2019 for preoperative clearance.  At that time, she was noted to be in junctional bradycardia and her beta-blocker was stopped.  Follow-up EKG demonstrated normal sinus rhythm.   Ms. Miskiewicz returns for follow up. She is here alone.  She has not had any chest pain.  She has chronic dyspnea on exertion.  This is unchanged. She sleeps on 2 pillows.  She denies paroxysmal nocturnal dyspnea, leg swelling.  She denies syncope, near syncope. She does feel occasional heart palpitations.  No rapid palpitations.    Prior CV studies:   The following studies were reviewed today:  ABIs 05/24/2018 Final Interpretation: Right: Resting right ankle-brachial index is within normal range. No evidence of significant right lower extremity arterial disease. The right toe-brachial index is normal.  Left: Resting left ankle-brachial index is within normal range. No evidence of significant left lower extremity arterial disease. The left toe-brachial index is normal.  Myoview 01/31/2017 1. EF 66%, normal wall motion.  2. Fixed, small, mild mid anterior perfusion defect.  Given normal wall motion, suspect soft tissue attenuation rather than infarction.  No ischemia.  3. Low risk study.   24-hour Holter 08/2016 Sinus rhythm  33 to 121 bpm  Average HR 65 bpm  Occasional PVCs, PACs Symptoms of slow, fast HR or SOB did not correlate with arrhythmia  High Res Chest CT  06/08/16 IMPRESSION: 1. Again noted is mild diffuse cylindrical bronchiectasis and thickening of the peribronchovascular interstitium. The subtle areas of ground-glass attenuation and septal thickening in the lungs persist compared to the prior study, but appear unchanged, and may indicate areas of nonspecific interstitial pneumonia (NSIP). 2. Today's study also demonstrates mild air trapping, indicative of small airways disease, and demonstrates evidence of tracheobronchomalacia. In retrospect, the prior expiratory phase images were unlikely to be truly expiratory on study 04/13/2015. 3. Aortic atherosclerosis, in addition to three-vessel coronary artery disease. Please note that although the presence of coronary artery calcium documents the presence of coronary artery disease, the severity of this disease and any potential stenosis cannot be assessed on this non-gated CT examination. Assessment for potential risk factor modification, dietary therapy or pharmacologic therapy may be warranted, if clinically indicated. 4. Cardiomegaly with mild left atrial dilatation.  Carotid US 05/20/2015 Bilateral ICA 1-39  Echocardiogram 04/13/2015 EF 60-65, normal wall motion, grade 1 diastolic dysfunction, mild LAE    Past Medical History:  Diagnosis Date  . Acute kidney injury (HCC) 01/11/2015  . Alcohol abuse, in remission   . Anemia   . Anemia, iron deficiency 01/28/2015  . Arthritis   . Asthma   . Bronchiectasis (HCC) 03/07/2016  . Bronchitis   . COPD (chronic obstructive pulmonary disease) (HCC)   . Diabetes mellitus without complication (HCC)    type 2  . DM II (diabetes mellitus, type II), controlled (HCC) 01/06/2015  . Dyspnea and respiratory abnormality 04/06/2015  .  Dysrhythmia    hx of palpitations  . E-coli UTI 01/11/2015  . Essential hypertension 01/06/2015  . GERD (gastroesophageal reflux disease)   . Hay fever   . Hepatitis    c treated with pills  . HLD (hyperlipidemia)  01/06/2015  . Hypertension   . IBS (irritable bowel syndrome)   . ILD (interstitial lung disease) (HCC) 12/11/2016  . Leg cramps, sleep related 12/01/2015  . Pneumonia   . Rheumatoid arthritis (HCC) 1996  . Sinus tachycardia 01/11/2015   Surgical Hx: The patient  has a past surgical history that includes Hernia repair; Hemorrhoid surgery; Cesarean section; Hernia repair; Abdominal hysterectomy; Esophagogastroduodenoscopy (egd) with propofol (N/A, 04/02/2015); Colonoscopy with propofol (N/A, 04/02/2015); Esophagogastroduodenoscopy (egd) with propofol (N/A, 03/15/2018); Balloon dilation (N/A, 03/15/2018); and Carpal tunnel release (Left, 04/19/2018).   Current Medications: Current Meds  Medication Sig  . acetaminophen (TYLENOL) 500 MG tablet Take 1 tablet (500 mg total) by mouth every 6 (six) hours as needed.  Marland Kitchen. albuterol (PROVENTIL HFA;VENTOLIN HFA) 108 (90 BASE) MCG/ACT inhaler Inhale 2 puffs into the lungs every 6 (six) hours as needed for wheezing or shortness of breath (wheezing).   Marland Kitchen. atorvastatin (LIPITOR) 40 MG tablet Take 1 tablet (40 mg total) by mouth daily. Please keep appt for future refills thank you.  . bumetanide (BUMEX) 2 MG tablet Take 0.5 mg by mouth every other day.   . cyanocobalamin 1000 MCG tablet Take 1,000 mcg by mouth every Thursday.   . cyclobenzaprine (FLEXERIL) 10 MG tablet Take 10 mg by mouth at bedtime.   . diclofenac sodium (VOLTAREN) 1 % GEL Apply 1 application topically 4 (four) times daily as needed (for pain).   Marland Kitchen. diphenoxylate-atropine (LOMOTIL) 2.5-0.025 MG tablet Take 1 tablet by mouth 4 (four) times daily as needed for diarrhea or loose stools.   . docusate sodium (COLACE) 100 MG capsule Take 100 mg by mouth 2 (two) times daily as needed for mild constipation.  . ergocalciferol (VITAMIN D2) 50000 UNITS capsule Take 50,000 Units by mouth every Monday.   . ezetimibe (ZETIA) 10 MG tablet ONE TABLET BY MOUTH DAILY  . ferrous sulfate 325 (65 FE) MG tablet Take 325 mg by  mouth 2 (two) times daily with a meal.   . fluticasone (FLONASE) 50 MCG/ACT nasal spray Place 1 spray into both nostrils daily as needed for allergies or rhinitis.  . folic acid (FOLVITE) 1 MG tablet Take 1 tablet (1 mg total) by mouth daily. Please schedule an appointment for more refills, thanks! [phone]. 1st attempt  . ibuprofen (ADVIL,MOTRIN) 800 MG tablet Take 800 mg by mouth 2 (two) times daily.   Marland Kitchen. ipratropium-albuterol (DUONEB) 0.5-2.5 (3) MG/3ML SOLN Inhale 3 mLs into the lungs every 6 (six) hours as needed (for shortness of breath).   . losartan (COZAAR) 50 MG tablet Take 50 mg by mouth daily.  . metFORMIN (GLUCOPHAGE) 500 MG tablet Take 500 mg by mouth 2 (two) times daily.  . montelukast (SINGULAIR) 10 MG tablet Take 10 mg by mouth at bedtime.  . pantoprazole (PROTONIX) 40 MG tablet Take 40 mg by mouth daily.   . potassium chloride (K-DUR) 10 MEQ tablet TAKE 1 TABLET BY MOUTH EVERY OTHER DAY  . SYMBICORT 160-4.5 MCG/ACT inhaler INHALE 2 PUFFS BY MOUTH INTO THE LUNG 2 TIMES A DAY   . verapamil (VERELAN PM) 180 MG 24 hr capsule Take 1 capsule (180 mg total) by mouth 2 (two) times daily.  . [DISCONTINUED] acetaminophen-codeine (TYLENOL #3) 300-30 MG tablet Take  1 tablet by mouth every 6 (six) hours as needed for moderate pain.  . [DISCONTINUED] cephALEXin (KEFLEX) 500 MG capsule Take 1 capsule (500 mg total) by mouth 2 (two) times daily.  . [DISCONTINUED] Colloidal Oatmeal (GOLD BOND ECZEMA RELIEF) 2 % CREA Apply 1 application topically 3 (three) times daily as needed (Rash).  . [DISCONTINUED] metoprolol succinate (TOPROL-XL) 25 MG 24 hr tablet ONE TABLET BY MOUTH DAILY  . [DISCONTINUED] ondansetron (ZOFRAN ODT) 8 MG disintegrating tablet Take 1 tablet (8 mg total) by mouth every 8 (eight) hours as needed for nausea.  . [DISCONTINUED] predniSONE (DELTASONE) 10 MG tablet Take 5 tablets (50 mg total) by mouth daily.     Allergies:   Fish allergy; Peanuts [peanut oil]; Penicillins;  Iodinated diagnostic agents; Iodine; Percocet [oxycodone-acetaminophen]; Hydrocodone; Other; Oxycodone; and Sulfa antibiotics   Social History   Tobacco Use  . Smoking status: Former Smoker    Packs/day: 0.75    Years: 25.00    Pack years: 18.75    Types: Cigarettes    Last attempt to quit: 01/12/2011    Years since quitting: 7.9  . Smokeless tobacco: Never Used  Substance Use Topics  . Alcohol use: Yes    Alcohol/week: 4.0 standard drinks    Types: 4 Cans of beer per week    Comment: heavy drinker in the past  . Drug use: No     Family Hx: The patient's family history includes Asthma in an other family member; COPD in an other family member; Diabetes in her mother; Heart disease in an other family member; Heart failure in her mother; Hypertension in an other family member; Stroke in an other family member; Throat cancer in her brother.  ROS:   Please see the history of present illness.    Review of Systems  Constitution: Positive for chills and malaise/fatigue.  HENT: Positive for hearing loss.   Eyes: Positive for visual disturbance.  Cardiovascular: Positive for irregular heartbeat.  Respiratory: Positive for cough, shortness of breath and wheezing.   Hematologic/Lymphatic: Bruises/bleeds easily.  Musculoskeletal: Positive for back pain, joint pain and joint swelling.  Gastrointestinal: Positive for diarrhea.  Neurological: Positive for loss of balance.   All other systems reviewed and are negative.   EKGs/Labs/Other Test Reviewed:    EKG:  EKG is  ordered today.  The ekg ordered today demonstrates accelerated junctional rhythm vs normal sinus rhythm with a very long 1st degree AVB, HR 72, normal axis, RBBB, QTc 470 - favor accelerated junctional rhythm rather than normal sinus rhythm.  Recent Labs: 09/02/2018: ALT 53; BUN 50; Creatinine, Ser 2.88; Hemoglobin 10.4; Platelets 400; Potassium 3.4; Sodium 137   Recent Lipid Panel Lab Results  Component Value Date/Time    CHOL 161 02/23/2017 04:42 PM   TRIG 106 02/23/2017 04:42 PM   HDL 48 02/23/2017 04:42 PM   CHOLHDL 3.4 02/23/2017 04:42 PM   CHOLHDL 3 05/07/2015 12:19 PM   LDLCALC 92 02/23/2017 04:42 PM     Physical Exam:    VS:  BP 118/60   Pulse 72   Ht 5\' 1"  (1.549 m)   Wt 208 lb 1.9 oz (94.4 kg)   SpO2 94%   BMI 39.32 kg/m     Wt Readings from Last 3 Encounters:  12/10/18 208 lb 1.9 oz (94.4 kg)  09/02/18 223 lb (101.2 kg)  07/18/18 223 lb 9.6 oz (101.4 kg)     Physical Exam  Constitutional: She is oriented to person, place, and time. She appears  well-developed and well-nourished. No distress.  HENT:  Head: Normocephalic and atraumatic.  Eyes: No scleral icterus.  Neck: Neck supple. No JVD present. No thyromegaly present.  Cardiovascular: Normal rate, regular rhythm, S1 normal, S2 normal and normal heart sounds.  No murmur heard. Pulmonary/Chest: Effort normal. She has no rales.  Abdominal: Soft. There is no hepatomegaly.  Musculoskeletal:        General: No edema.  Lymphadenopathy:    She has no cervical adenopathy.  Neurological: She is alert and oriented to person, place, and time.  Skin: Skin is warm and dry.  Psychiatric: She has a normal mood and affect.    ASSESSMENT & PLAN:    Junctional rhythm HR is 72.  She is not really symptomatic with this.  She has not had any syncope or near syncope.  She is on Verapamil 180 mg Twice daily.  She does have occasional palpitations.  Arrange long term cardiac monitor to further assess HR and rhythm.  For now, continue Verapamil.  If she has episodes of bradycardia, will need to decrease vs DC verapamil.  Follow up with Dr. Tenny Crawoss in 2-3 mos or sooner as needed.  With her palpitations, she may be describing PVCs.  I will request recent labs from PCP.  If K+ remains low, we may need to consider increasing K+ supplement.   Coronary artery disease involving native coronary artery of native heart without angina pectoris Prior CT with  evidence of coronary artery disease.  She is not having anginal symptoms.  She is not on ASA for unclear reasons.  I will see if we can get her started on this.  Continue statin.   Essential hypertension  The patient's blood pressure is controlled on her current regimen.  Continue current therapy.    Mixed hyperlipidemia Continue statin.  Obtain recent labs from PCP.    Dispo:  Return in about 3 months (around 03/11/2019) for Follow up after testing, w/ Dr. Tenny Crawoss.   Medication Adjustments/Labs and Tests Ordered: Current medicines are reviewed at length with the patient today.  Concerns regarding medicines are outlined above.  Tests Ordered: Orders Placed This Encounter  Procedures  . LONG TERM MONITOR (3-14 DAYS)  . EKG 12-Lead   Medication Changes: No orders of the defined types were placed in this encounter.   Signed, Tereso NewcomerScott Hiroto Saltzman, PA-C  12/10/2018 8:05 PM    [facility] [address], [place], [place]  [address] Phone: [phone]; Fax: [phone]

## 2018-12-10 NOTE — Telephone Encounter (Signed)
Please contact patient. I noticed she is not on ASA. With coronary artery disease, she would benefit from taking an ASA a day to prevent a heart attack. Please ask Anita Gonzalez if she has any allergies or intolerances to ASA. If not, she can start ASA 81 mg QD. Tereso Newcomer, PA-C    12/10/2018 8:04 PM

## 2018-12-10 NOTE — Patient Instructions (Signed)
Medication Instructions:  Your physician recommends that you continue on your current medications as directed. Please refer to the Current Medication list given to you today.    If you need a refill on your cardiac medications before your next appointment, please call your pharmacy.   Lab work: NONE  If you have labs (blood work) drawn today and your tests are completely normal, you will receive your results only by: Marland Kitchen MyChart Message (if you have MyChart) OR . A paper copy in the mail If you have any lab test that is abnormal or we need to change your treatment, we will call you to review the results.  Testing/Procedures: Your physician has recommended that you wear an LONG TERM event monitor. Event monitors are medical devices that record the heart's electrical activity. Doctors most often [place] these monitors to diagnose arrhythmias. Arrhythmias are problems with the speed or rhythm of the heartbeat. The monitor is a small, portable device. You can wear one while you do your normal daily activities. This is usually used to diagnose what is causing palpitations/syncope (passing out).    Follow-Up: At [facility], you and your health needs are our priority.  As part of our continuing mission to provide you with exceptional heart care, we have created designated Provider Care Teams.  These Care Teams include your primary Cardiologist (physician) and Advanced Practice Providers (APPs -  Physician Assistants and Nurse Practitioners) who all work together to provide you with the care you need, when you need it. . You will need a follow up appointment in:  3 months.  You may see Dietrich Pates, MD*or one of the following Advanced Practice Providers on your designated Care Team: . Tereso Newcomer, PA-C . Vin Bhagat, PA-C . Berton Bon, NP  Any Other Special Instructions Will Be Listed Below (If Applicable).

## 2018-12-11 DIAGNOSIS — J45909 Unspecified asthma, uncomplicated: Secondary | ICD-10-CM | POA: Diagnosis not present

## 2018-12-11 DIAGNOSIS — D509 Iron deficiency anemia, unspecified: Secondary | ICD-10-CM | POA: Diagnosis not present

## 2018-12-11 DIAGNOSIS — J189 Pneumonia, unspecified organism: Secondary | ICD-10-CM | POA: Diagnosis not present

## 2018-12-11 DIAGNOSIS — J9600 Acute respiratory failure, unspecified whether with hypoxia or hypercapnia: Secondary | ICD-10-CM | POA: Diagnosis not present

## 2018-12-12 MED ORDER — ASPIRIN EC 81 MG PO TBEC: 81.0000 mg | DELAYED_RELEASE_TABLET | Freq: Every day | ORAL | 3 refills | Status: AC

## 2018-12-12 NOTE — Telephone Encounter (Signed)
Spoke with pt who states she hasn't taken ASA in a while. I advised her per Tereso Newcomer, PA-C to start taking ASA 81 mg to prevent a heart attack. Pt says that she doesn't think she is allergic or have any intolerances to ASA. Pt verbalized understanding and thanked me for the call. Pt's med list has been updated.

## 2018-12-14 DIAGNOSIS — Z8739 Personal history of other diseases of the musculoskeletal system and connective tissue: Secondary | ICD-10-CM | POA: Diagnosis not present

## 2018-12-14 DIAGNOSIS — J449 Chronic obstructive pulmonary disease, unspecified: Secondary | ICD-10-CM | POA: Diagnosis not present

## 2018-12-14 DIAGNOSIS — Z8709 Personal history of other diseases of the respiratory system: Secondary | ICD-10-CM | POA: Diagnosis not present

## 2018-12-14 DIAGNOSIS — R0902 Hypoxemia: Secondary | ICD-10-CM | POA: Diagnosis not present

## 2018-12-17 ENCOUNTER — Telehealth: Payer: Self-pay | Admitting: Physician Assistant

## 2018-12-17 DIAGNOSIS — I498 Other specified cardiac arrhythmias: Secondary | ICD-10-CM

## 2018-12-17 DIAGNOSIS — I10 Essential (primary) hypertension: Secondary | ICD-10-CM

## 2018-12-17 NOTE — Telephone Encounter (Deleted)
Pricilla Riffle, MD  Beatrice Lecher, PA-C        Rydge Texidor,   I would recomm pulling back on Verapamil to daily, maybe even a lower dose   Repeat EKG in 2 wks  Make sure she is not taking b blocker   Gunnar Fusi

## 2018-12-17 NOTE — Telephone Encounter (Signed)
Please call patient. I discussed her case with Dr. Tenny Craw. PLAN:   1. Decrease Verapamil to 180 mg QD 2. Make sure she stopped the Metoprolol.  This was stopped in the summer of 2019 when she saw Nada Boozer, NP  3. Obtain EKG when she presents to have the monitor placed on 12/27/2018. 4. Keep appt with Dr. Tenny Craw in May 2020 as planned.  Tereso Newcomer, PA-C    12/17/2018 12:54 PM

## 2018-12-18 MED ORDER — VERAPAMIL HCL ER 180 MG PO TBCR: 180.0000 mg | EXTENDED_RELEASE_TABLET | Freq: Every day | ORAL | 3 refills | Status: DC

## 2018-12-18 NOTE — Telephone Encounter (Signed)
Reviewed recommendations per Tereso Newcomer, PA-C with pt who verbalized understanding. Per Lorin Picket Weaver/Dr. Tenny Craw to decrease Verapamil to 180 mg qd. Pt states that she has stopped Metoprolol. I have placed an order to have EKG done when monitor placed. Pt is agreeable to keep appt with Dr. Tenny Craw in May of 2020. Orders have been placed into Epic.

## 2018-12-27 ENCOUNTER — Ambulatory Visit: Payer: Medicare HMO

## 2019-01-01 ENCOUNTER — Ambulatory Visit (INDEPENDENT_AMBULATORY_CARE_PROVIDER_SITE_OTHER): Payer: Medicare HMO

## 2019-01-01 DIAGNOSIS — I498 Other specified cardiac arrhythmias: Secondary | ICD-10-CM

## 2019-01-02 DIAGNOSIS — I119 Hypertensive heart disease without heart failure: Secondary | ICD-10-CM | POA: Diagnosis not present

## 2019-01-02 DIAGNOSIS — Z0001 Encounter for general adult medical examination with abnormal findings: Secondary | ICD-10-CM | POA: Diagnosis not present

## 2019-01-02 DIAGNOSIS — E559 Vitamin D deficiency, unspecified: Secondary | ICD-10-CM | POA: Diagnosis not present

## 2019-01-02 DIAGNOSIS — E782 Mixed hyperlipidemia: Secondary | ICD-10-CM | POA: Diagnosis not present

## 2019-01-02 DIAGNOSIS — J45909 Unspecified asthma, uncomplicated: Secondary | ICD-10-CM | POA: Diagnosis not present

## 2019-01-02 DIAGNOSIS — R1013 Epigastric pain: Secondary | ICD-10-CM | POA: Diagnosis not present

## 2019-01-02 DIAGNOSIS — E1129 Type 2 diabetes mellitus with other diabetic kidney complication: Secondary | ICD-10-CM | POA: Diagnosis not present

## 2019-01-02 DIAGNOSIS — G4733 Obstructive sleep apnea (adult) (pediatric): Secondary | ICD-10-CM | POA: Diagnosis not present

## 2019-01-02 DIAGNOSIS — G8929 Other chronic pain: Secondary | ICD-10-CM | POA: Diagnosis not present

## 2019-01-05 DIAGNOSIS — Z8709 Personal history of other diseases of the respiratory system: Secondary | ICD-10-CM | POA: Diagnosis not present

## 2019-01-05 DIAGNOSIS — Z8739 Personal history of other diseases of the musculoskeletal system and connective tissue: Secondary | ICD-10-CM | POA: Diagnosis not present

## 2019-01-05 DIAGNOSIS — R0902 Hypoxemia: Secondary | ICD-10-CM | POA: Diagnosis not present

## 2019-01-05 DIAGNOSIS — J449 Chronic obstructive pulmonary disease, unspecified: Secondary | ICD-10-CM | POA: Diagnosis not present

## 2019-01-06 ENCOUNTER — Ambulatory Visit: Payer: Medicare HMO

## 2019-01-11 DIAGNOSIS — D509 Iron deficiency anemia, unspecified: Secondary | ICD-10-CM | POA: Diagnosis not present

## 2019-01-11 DIAGNOSIS — J45909 Unspecified asthma, uncomplicated: Secondary | ICD-10-CM | POA: Diagnosis not present

## 2019-01-11 DIAGNOSIS — J9600 Acute respiratory failure, unspecified whether with hypoxia or hypercapnia: Secondary | ICD-10-CM | POA: Diagnosis not present

## 2019-01-11 DIAGNOSIS — J189 Pneumonia, unspecified organism: Secondary | ICD-10-CM | POA: Diagnosis not present

## 2019-01-16 ENCOUNTER — Inpatient Hospital Stay: Payer: Medicare HMO | Attending: Hematology

## 2019-01-16 ENCOUNTER — Inpatient Hospital Stay: Payer: Medicare HMO | Admitting: Hematology

## 2019-01-22 DIAGNOSIS — I498 Other specified cardiac arrhythmias: Secondary | ICD-10-CM | POA: Diagnosis not present

## 2019-01-23 ENCOUNTER — Other Ambulatory Visit: Payer: Self-pay | Admitting: Nurse Practitioner

## 2019-01-23 DIAGNOSIS — K7469 Other cirrhosis of liver: Secondary | ICD-10-CM

## 2019-01-24 ENCOUNTER — Other Ambulatory Visit: Payer: Self-pay | Admitting: Internal Medicine

## 2019-01-24 ENCOUNTER — Encounter: Payer: Self-pay | Admitting: Physician Assistant

## 2019-01-30 ENCOUNTER — Telehealth: Payer: Self-pay | Admitting: Internal Medicine

## 2019-01-30 ENCOUNTER — Ambulatory Visit: Payer: Medicare HMO

## 2019-01-30 NOTE — Telephone Encounter (Signed)
Pt c/o medication issue:  1. Name of Medication: metoprolol   2. How are you currently taking this medication (dosage and times per day)? 25mg  1x daily  3. Are you having a reaction (difficulty breathing--STAT)? no  4. What is your medication issue? Is she supposed to be taking it? She thinks there was a dr in the past that told her to stop taking it, but when she picked up her medication refills, the metoprolol was in with her other rx

## 2019-01-30 NOTE — Telephone Encounter (Signed)
Patient received several refilled medications today from her mail order pharmacy [facility]. Metoprolol was sent in the batch of medicines she got from them. She is aware that this was to be stopped, so she called to confirm.  Adv pt she is correct and thanked her for recognizing this is a medicine she should avoid for now. Also reviewed recent monitor results with her.  I called [facility] pharmacy.  Only option was to leave voicemail.  Adv that pt is not to receive metoprolol due to it was discontinued.

## 2019-02-03 DIAGNOSIS — Z8709 Personal history of other diseases of the respiratory system: Secondary | ICD-10-CM | POA: Diagnosis not present

## 2019-02-03 DIAGNOSIS — R0902 Hypoxemia: Secondary | ICD-10-CM | POA: Diagnosis not present

## 2019-02-03 DIAGNOSIS — J449 Chronic obstructive pulmonary disease, unspecified: Secondary | ICD-10-CM | POA: Diagnosis not present

## 2019-02-03 DIAGNOSIS — Z8739 Personal history of other diseases of the musculoskeletal system and connective tissue: Secondary | ICD-10-CM | POA: Diagnosis not present

## 2019-02-09 DIAGNOSIS — J189 Pneumonia, unspecified organism: Secondary | ICD-10-CM | POA: Diagnosis not present

## 2019-02-09 DIAGNOSIS — J9600 Acute respiratory failure, unspecified whether with hypoxia or hypercapnia: Secondary | ICD-10-CM | POA: Diagnosis not present

## 2019-02-09 DIAGNOSIS — D509 Iron deficiency anemia, unspecified: Secondary | ICD-10-CM | POA: Diagnosis not present

## 2019-02-09 DIAGNOSIS — J45909 Unspecified asthma, uncomplicated: Secondary | ICD-10-CM | POA: Diagnosis not present

## 2019-02-10 ENCOUNTER — Telehealth: Payer: Self-pay | Admitting: Physician Assistant

## 2019-02-10 NOTE — Telephone Encounter (Signed)
Paged by Mrs. Heckmann requesting antibiotics for urinary tract infection. I advised her to contact her PCP, however she says she has been unable to reach her PCP since this morning, that's why she paged cardiology service. I advised her to try again tomorrow. Unless I hear back differently from her primary cardiologist, this should be addressed by PCP

## 2019-02-11 NOTE — Telephone Encounter (Signed)
Agree Tereso Newcomer, PA-C    02/11/2019 10:20 AM

## 2019-02-13 ENCOUNTER — Telehealth: Payer: Self-pay | Admitting: Physician Assistant

## 2019-02-13 NOTE — Telephone Encounter (Signed)
**  EDIT**  Pt said she just received the name of the medication, but not an actual rx yet. I contacted the pharmacy to clarify the issue.  The pharmacy stated that a Army Melia wrote the pt an rx in 09/2018, and the pharmacy has requested another rx from this provider, but no rx has been submitted yet.

## 2019-02-13 NOTE — Telephone Encounter (Signed)
Pt received medication.  Ciprosloxacin 500mg  2x daily for 10 days.  Called to make [place] aware that she was taking this medication

## 2019-02-13 NOTE — Telephone Encounter (Signed)
Pt called requesting ABX for a UTI.   She stated she had left Dr Julio Sicks messages, but had not gotten a call back.   She is not running fevers. No other sx. Stated her last UTI had required 2 rds ABX to treat it, could not remember the name of either one.   I explained that Cardiology did not prescribe ABX.   I encouraged her to use OTC meds to control sx and f/u w/ PCP in am.  Theodore Demark, PA-C 02/13/2019 6:07 PM Beeper [phone]

## 2019-02-17 DIAGNOSIS — J45909 Unspecified asthma, uncomplicated: Secondary | ICD-10-CM | POA: Diagnosis not present

## 2019-02-17 DIAGNOSIS — R1013 Epigastric pain: Secondary | ICD-10-CM | POA: Diagnosis not present

## 2019-02-17 DIAGNOSIS — G4733 Obstructive sleep apnea (adult) (pediatric): Secondary | ICD-10-CM | POA: Diagnosis not present

## 2019-02-17 DIAGNOSIS — I119 Hypertensive heart disease without heart failure: Secondary | ICD-10-CM | POA: Diagnosis not present

## 2019-02-17 DIAGNOSIS — G8929 Other chronic pain: Secondary | ICD-10-CM | POA: Diagnosis not present

## 2019-02-17 DIAGNOSIS — E782 Mixed hyperlipidemia: Secondary | ICD-10-CM | POA: Diagnosis not present

## 2019-02-17 DIAGNOSIS — E559 Vitamin D deficiency, unspecified: Secondary | ICD-10-CM | POA: Diagnosis not present

## 2019-02-17 DIAGNOSIS — E1129 Type 2 diabetes mellitus with other diabetic kidney complication: Secondary | ICD-10-CM | POA: Diagnosis not present

## 2019-03-04 ENCOUNTER — Other Ambulatory Visit: Payer: Self-pay | Admitting: Internal Medicine

## 2019-03-05 NOTE — Telephone Encounter (Signed)
Flexeril should be directed to PCP for refill.

## 2019-03-06 DIAGNOSIS — Z8709 Personal history of other diseases of the respiratory system: Secondary | ICD-10-CM | POA: Diagnosis not present

## 2019-03-06 DIAGNOSIS — J449 Chronic obstructive pulmonary disease, unspecified: Secondary | ICD-10-CM | POA: Diagnosis not present

## 2019-03-06 DIAGNOSIS — R0902 Hypoxemia: Secondary | ICD-10-CM | POA: Diagnosis not present

## 2019-03-06 DIAGNOSIS — Z8739 Personal history of other diseases of the musculoskeletal system and connective tissue: Secondary | ICD-10-CM | POA: Diagnosis not present

## 2019-03-11 ENCOUNTER — Other Ambulatory Visit: Payer: Medicare HMO

## 2019-03-12 DIAGNOSIS — J45909 Unspecified asthma, uncomplicated: Secondary | ICD-10-CM | POA: Diagnosis not present

## 2019-03-12 DIAGNOSIS — J189 Pneumonia, unspecified organism: Secondary | ICD-10-CM | POA: Diagnosis not present

## 2019-03-12 DIAGNOSIS — D509 Iron deficiency anemia, unspecified: Secondary | ICD-10-CM | POA: Diagnosis not present

## 2019-03-12 DIAGNOSIS — J9600 Acute respiratory failure, unspecified whether with hypoxia or hypercapnia: Secondary | ICD-10-CM | POA: Diagnosis not present

## 2019-03-14 ENCOUNTER — Other Ambulatory Visit: Payer: Self-pay | Admitting: Internal Medicine

## 2019-03-17 ENCOUNTER — Other Ambulatory Visit: Payer: Self-pay | Admitting: Internal Medicine

## 2019-03-19 ENCOUNTER — Telehealth: Payer: Self-pay | Admitting: Internal Medicine

## 2019-03-19 NOTE — Telephone Encounter (Signed)
Pt. Does not have a smart phone. She wants to be called on the house phone. [phone].    Virtual Visit Pre-Appointment Phone Call  "(Name), I am calling you today to discuss your upcoming appointment. We are currently trying to limit exposure to the virus that causes COVID-19 by seeing patients at home rather than in the office."  1. "What is the BEST phone number to call the day of the visit?" - include this in appointment notes  2. Do you have or have access to (through a family member/friend) a smartphone with video capability that we can use for your visit?" a. If yes - list this number in appt notes as cell (if different from BEST phone #) and list the appointment type as a VIDEO visit in appointment notes b. If no - list the appointment type as a PHONE visit in appointment notes  3. Confirm consent - "In the setting of the current Covid19 crisis, you are scheduled for a (phone or video) visit with your provider on (date) at (time).  Just as we do with many in-office visits, in order for you to participate in this visit, we must obtain consent.  If you'd like, I can send this to your mychart (if signed up) or email for you to review.  Otherwise, I can obtain your verbal consent now.  All virtual visits are billed to your insurance company just like a normal visit would be.  By agreeing to a virtual visit, we'd like you to understand that the technology does not allow for your provider to perform an examination, and thus may limit your provider's ability to fully assess your condition. If your provider identifies any concerns that need to be evaluated in person, we will make arrangements to do so.  Finally, though the technology is pretty good, we cannot assure that it will always work on either your or our end, and in the setting of a video visit, we may have to convert it to a phone-only visit.  In either situation, we cannot ensure that we have a secure connection.  Are you willing to  proceed?" YES  4. Advise patient to be prepared - "Two hours prior to your appointment, go ahead and check your blood pressure, pulse, oxygen saturation, and your weight (if you have the equipment to check those) and write them all down. When your visit starts, your provider will ask you for this information. If you have an Apple Watch or Kardia device, please plan to have heart rate information ready on the day of your appointment. Please have a pen and paper handy nearby the day of the visit as well."  5. Give patient instructions for MyChart download to smartphone OR Doximity/Doxy.me as below if video visit (depending on what platform provider is using)  6. Inform patient they will receive a phone call 15 minutes prior to their appointment time (may be from unknown caller ID) so they should be prepared to answer    TELEPHONE CALL NOTE  Anita Gonzalez has been deemed a candidate for a follow-up tele-health visit to limit community exposure during the Covid-19 pandemic. I spoke with the patient via phone to ensure availability of phone/video source, confirm preferred email & phone number, and discuss instructions and expectations.  I reminded Anita Gonzalez to be prepared with any vital sign and/or heart rhythm information that could potentially be obtained via home monitoring, at the time of her visit. I reminded Anita Gonzalez to expect  a phone call prior to her visit.  Scherrie Bateman 03/19/2019 3:57 PM   INSTRUCTIONS FOR DOWNLOADING THE MYCHART APP TO SMARTPHONE  - The patient must first make sure to have activated MyChart and know their login information - If Apple, go to [employer] and type in MyChart in the search bar and download the app. If Android, ask patient to go to [employer] and type in [place] in the search bar and download the app. The app is free but as with any other app downloads, their phone may require them to verify saved payment information or Apple/Android password.    - The patient will need to then log into the app with their MyChart username and password, and select [facility] as their healthcare provider to link the account. When it is time for your visit, go to the MyChart app, find appointments, and click Begin Video Visit. Be sure to Select Allow for your device to access the Microphone and Camera for your visit. You will then be connected, and your provider will be with you shortly.  **If they have any issues connecting, or need assistance please contact MyChart service desk [phone])**  **If using a computer, in order to ensure the best quality for their visit they will need to use either of the following Internet Browsers: [employer] or Google Chrome**  IF USING DOXIMITY or DOXY.ME - The patient will receive a link just prior to their visit by text.     FULL LENGTH CONSENT FOR TELE-HEALTH VISIT   I hereby voluntarily request, consent and authorize [facility] and its employed or contracted physicians, physician assistants, nurse practitioners or other licensed health care professionals (the Practitioner), to provide me with telemedicine health care services (the Services") as deemed necessary by the treating Practitioner. I acknowledge and consent to receive the Services by the Practitioner via telemedicine. I understand that the telemedicine visit will involve communicating with the Practitioner through live audiovisual communication technology and the disclosure of certain medical information by electronic transmission. I acknowledge that I have been given the opportunity to request an in-person assessment or other available alternative prior to the telemedicine visit and am voluntarily participating in the telemedicine visit.  I understand that I have the right to withhold or withdraw my consent to the use of telemedicine in the course of my care at any time, without affecting my right to future care or treatment, and that  the Practitioner or I may terminate the telemedicine visit at any time. I understand that I have the right to inspect all information obtained and/or recorded in the course of the telemedicine visit and may receive copies of available information for a reasonable fee.  I understand that some of the potential risks of receiving the Services via telemedicine include:   Delay or interruption in medical evaluation due to technological equipment failure or disruption;  Information transmitted may not be sufficient (e.g. poor resolution of images) to allow for appropriate medical decision making by the Practitioner; and/or   In rare instances, security protocols could fail, causing a breach of personal health information.  Furthermore, I acknowledge that it is my responsibility to provide information about my medical history, conditions and care that is complete and accurate to the best of my ability. I acknowledge that Practitioner's advice, recommendations, and/or decision may be based on factors not within their control, such as incomplete or inaccurate data provided by me or distortions of diagnostic images or specimens that may  result from electronic transmissions. I understand that the practice of medicine is not an exact science and that Practitioner makes no warranties or guarantees regarding treatment outcomes. I acknowledge that I will receive a copy of this consent concurrently upon execution via email to the email address I last provided but may also request a printed copy by calling the office of [facility].    I understand that my insurance will be billed for this visit.   I have read or had this consent read to me.  I understand the contents of this consent, which adequately explains the benefits and risks of the Services being provided via telemedicine.   I have been provided ample opportunity to ask questions regarding this consent and the Services and have had my questions answered to  my satisfaction.  I give my informed consent for the services to be provided through the use of telemedicine in my medical care  By participating in this telemedicine visit I agree to the above.

## 2019-03-25 DIAGNOSIS — M62838 Other muscle spasm: Secondary | ICD-10-CM | POA: Diagnosis not present

## 2019-03-31 ENCOUNTER — Other Ambulatory Visit: Payer: Medicare HMO

## 2019-04-03 ENCOUNTER — Other Ambulatory Visit: Payer: Self-pay | Admitting: Internal Medicine

## 2019-04-04 ENCOUNTER — Encounter: Payer: Self-pay | Admitting: Internal Medicine

## 2019-04-04 ENCOUNTER — Telehealth: Payer: Self-pay | Admitting: *Deleted

## 2019-04-04 ENCOUNTER — Telehealth (INDEPENDENT_AMBULATORY_CARE_PROVIDER_SITE_OTHER): Payer: Medicare HMO | Admitting: Internal Medicine

## 2019-04-04 ENCOUNTER — Encounter: Payer: Self-pay | Admitting: *Deleted

## 2019-04-04 ENCOUNTER — Other Ambulatory Visit: Payer: Self-pay

## 2019-04-04 VITALS — BP 175/89 | HR 79 | Ht 61.0 in | Wt 213.0 lb

## 2019-04-04 DIAGNOSIS — E782 Mixed hyperlipidemia: Secondary | ICD-10-CM

## 2019-04-04 DIAGNOSIS — I2510 Atherosclerotic heart disease of native coronary artery without angina pectoris: Secondary | ICD-10-CM

## 2019-04-04 DIAGNOSIS — I498 Other specified cardiac arrhythmias: Secondary | ICD-10-CM

## 2019-04-04 DIAGNOSIS — I10 Essential (primary) hypertension: Secondary | ICD-10-CM

## 2019-04-04 NOTE — Telephone Encounter (Signed)
[facility] Home Visit Initial Request  Agency Requested:    [facility] Contact:  Alcide Clever, NP [address] [place], [place] [address] Phone #:  [phone] Fax #:  [phone]  Patient Demographic Information: Name:  Anita Gonzalez Age:  73 y.o.   DOB:  10-14-1946  MRN:  [id number]   Address:   [address] [place] [address]   Phone Numbers:   Home Phone [phone]  Mobile [phone]  Home Phone [phone]     Emergency Contact Information on File:   Contact Information    Name Relation Home Work Mobile   [place] Daughter [phone]  [phone]   Shon Baton' Daughter [phone]  [phone]      The above family members may be contacted for information on this patient (review DPR on file):  Yes    Patient Clinical Information:  Primary Care Provider:  [name], MD  Primary Cardiologist:  Dietrich Pates, MD  Primary Electrophysiologist:  None   Requesting Provider:  Dr. Dietrich Pates    Past Medical Hx: Ms. Kirst  has a past medical history of Acute kidney injury (HCC) (01/11/2015), Alcohol abuse, in remission, Anemia, Anemia, iron deficiency (01/28/2015), Arthritis, Asthma, Bronchiectasis (HCC) (03/07/2016), Bronchitis, COPD (chronic obstructive pulmonary disease) (HCC), Diabetes mellitus without complication (HCC), DM II (diabetes mellitus, type II), controlled (HCC) (01/06/2015), Dyspnea and respiratory abnormality (04/06/2015), Dysrhythmia, E-coli UTI (01/11/2015), Essential hypertension (01/06/2015), GERD (gastroesophageal reflux disease), Hay fever, Hepatitis, HLD (hyperlipidemia) (01/06/2015), Hypertension, IBS (irritable bowel syndrome), ILD (interstitial lung disease) (HCC) (12/11/2016), Leg cramps, sleep related (12/01/2015), Pneumonia, Rheumatoid arthritis (HCC) (1996), and Sinus tachycardia (01/11/2015).   Allergies: She is allergic to fish allergy; peanuts [peanut oil]; penicillins;  iodinated diagnostic agents; iodine; percocet [oxycodone-acetaminophen]; hydrocodone; other; oxycodone; and sulfa antibiotics.   Medications: Current Outpatient Medications on File Prior to Visit  Medication Sig  . albuterol (PROVENTIL HFA;VENTOLIN HFA) 108 (90 BASE) MCG/ACT inhaler Inhale 2 puffs into the lungs every 6 (six) hours as needed for wheezing or shortness of breath (wheezing).   Marland Kitchen aspirin EC 81 MG tablet Take 1 tablet (81 mg total) by mouth daily.  Marland Kitchen atorvastatin (LIPITOR) 40 MG tablet Take 1 tablet (40 mg total) by mouth daily. Please keep appt for future refills thank you.  . bumetanide (BUMEX) 2 MG tablet Take 0.5 mg by mouth every other day.   . cyanocobalamin 1000 MCG tablet Take 1,000 mcg by mouth every Thursday.   . cyclobenzaprine (FLEXERIL) 10 MG tablet Take 10 mg by mouth at bedtime.   . diclofenac sodium (VOLTAREN) 1 % GEL Apply 1 application topically 4 (four) times daily as needed (for pain).   Marland Kitchen diphenoxylate-atropine (LOMOTIL) 2.5-0.025 MG tablet Take 1 tablet by mouth 4 (four) times daily as needed for diarrhea or loose stools.   . docusate sodium (COLACE) 100 MG capsule Take 100 mg by mouth 2 (two) times daily as needed for mild constipation.  . ergocalciferol (VITAMIN D2) 50000 UNITS capsule Take 50,000 Units by mouth every Monday.   . ezetimibe (ZETIA) 10 MG tablet ONE TABLET BY MOUTH DAILY   . ferrous sulfate 325 (65 FE) MG tablet Take 325 mg by mouth 2 (two) times daily with a meal.   . fluticasone (FLONASE) 50 MCG/ACT nasal spray Place 1 spray into both nostrils daily as needed for allergies or rhinitis.  . folic acid (FOLVITE) 1 MG tablet Take 1 tablet (1 mg total) by mouth daily. Please schedule an appointment for  more refills, thanks! [phone]. 1st attempt  . ibuprofen (ADVIL) 800 MG tablet ONE TABLET BY MOUTH THREE TIMES A DAY AS NEEDED   . ipratropium-albuterol (DUONEB) 0.5-2.5 (3) MG/3ML SOLN Inhale 3 mLs into the lungs every 6 (six) hours as needed  (for shortness of breath).   . losartan (COZAAR) 50 MG tablet Take 50 mg by mouth daily.  . metFORMIN (GLUCOPHAGE) 500 MG tablet Take 500 mg by mouth 2 (two) times daily.  . montelukast (SINGULAIR) 10 MG tablet Take 10 mg by mouth at bedtime.  . pantoprazole (PROTONIX) 40 MG tablet Take 40 mg by mouth daily.   . potassium chloride (K-DUR) 10 MEQ tablet TAKE 1 TABLET BY MOUTH EVERY OTHER DAY (Patient not taking: Reported on 04/04/2019)  . SYMBICORT 160-4.5 MCG/ACT inhaler INHALE 2 PUFFS BY MOUTH INTO THE LUNG 2 TIMES A DAY   . verapamil (CALAN-SR) 180 MG CR tablet Take 1 tablet (180 mg total) by mouth daily.   No current facility-administered medications on file prior to visit.      Social Hx: She  reports that she quit smoking about 8 years ago. Her smoking use included cigarettes. She has a 18.75 pack-year smoking history. She has never used smokeless tobacco. She reports current alcohol use of about 4.0 standard drinks of alcohol per week. She reports that she does not use drugs.    Diagnosis/Reason for Visit:   BP check and labs, difficult transportation  Services Requested:  Vital Signs (BP, Pulse, O2, Weight)  Labs:  cbc, bmet, bnp, tsh, lipids  # of Visits Needed/Frequency per Week: 1  A copy of the office note will be faxed with this form. All labs ordered for this home visit have been released.

## 2019-04-04 NOTE — Progress Notes (Signed)
Virtual Visit via Telephone Note   This visit type was conducted due to national recommendations for restrictions regarding the COVID-19 Pandemic (e.g. social distancing) in an effort to limit this patient's exposure and mitigate transmission in our community.  Due to her co-morbid illnesses, this patient is at least at moderate risk for complications without adequate follow up.  This format is felt to be most appropriate for this patient at this time.  The patient did not have access to video technology/had technical difficulties with video requiring transitioning to audio format only (telephone).  All issues noted in this document were discussed and addressed.  No physical exam could be performed with this format.  Please refer to the patient's chart for her  consent [facility].   Date:  04/04/2019   ID:  Anita Gonzalez, DOB 09/16/1946, MRN [id number]  Patient Location: Home Provider Location: Home  PCP:  Jackie Plumsei-Bonsu, George, MD  Cardiologist:  Dietrich PatesPaula Fidencio Duddy, MD   Evaluation Performed:  Follow-Up Visit  Chief Complaint:  F/U of CAD and HTN    History of Present Illness:    Anita Gonzalez is a 73 y.o. female with CAD (on CT scan), DM, HTN, HL, COPD   Nuclear scan in 2018 showed no sischemia   She was last seen in clinic by Wende MottS Weaver on 12/10/18  EKG showed an accelerated junctional rhythm   72     When seen seh was in a junctional rhythm at 72 bpm   She was set up for a monitor   Monitor showed SR Occasional accleerated junctional  Breathing is no worse  About the same   No dizzy spells now   No CP    No papitations     BP at home has been high 170s     Has been like this for a week    Before that was better   Was taking bumex as needed   Has not been out to have labs   The patient does not have symptoms concerning for COVID-19 infection (fever, chills, cough, or new shortness of breath).    Past Medical History:  Diagnosis Date  . Acute kidney injury (HCC)  01/11/2015  . Alcohol abuse, in remission   . Anemia   . Anemia, iron deficiency 01/28/2015  . Arthritis   . Asthma   . Bronchiectasis (HCC) 03/07/2016  . Bronchitis   . COPD (chronic obstructive pulmonary disease) (HCC)   . Diabetes mellitus without complication (HCC)    type 2  . DM II (diabetes mellitus, type II), controlled (HCC) 01/06/2015  . Dyspnea and respiratory abnormality 04/06/2015  . Dysrhythmia    hx of palpitations  . E-coli UTI 01/11/2015  . Essential hypertension 01/06/2015  . GERD (gastroesophageal reflux disease)   . Hay fever   . Hepatitis    c treated with pills  . HLD (hyperlipidemia) 01/06/2015  . Hypertension   . IBS (irritable bowel syndrome)   . ILD (interstitial lung disease) (HCC) 12/11/2016  . Leg cramps, sleep related 12/01/2015  . Pneumonia   . Rheumatoid arthritis (HCC) 1996  . Sinus tachycardia 01/11/2015   LT monitor 01/2019: normal sinus rhythm, occ short bursts of PAT, accelerated junctional rhythm, no sig arrhythmias.    Past Surgical History:  Procedure Laterality Date  . ABDOMINAL HYSTERECTOMY    . BALLOON DILATION N/A 03/15/2018   Procedure: BALLOON DILATION;  Surgeon: Jeani HawkingHung, Patrick, MD;  [facility];  Service: Endoscopy;  Laterality: N/A;  . CARPAL TUNNEL RELEASE Left 04/19/2018   Procedure: CARPAL TUNNEL RELEASE;  Surgeon: Coletta Memos, MD;  Location: [facility];  Service: Neurosurgery;  Laterality: Left;  CARPAL TUNNEL RELEASE  . CESAREAN SECTION    . COLONOSCOPY WITH PROPOFOL N/A 04/02/2015   Procedure: COLONOSCOPY WITH PROPOFOL;  Surgeon: Jeani Hawking, MD;  Location: [facility];  Service: Endoscopy;  Laterality: N/A;  . ESOPHAGOGASTRODUODENOSCOPY (EGD) WITH PROPOFOL N/A 04/02/2015   Procedure: ESOPHAGOGASTRODUODENOSCOPY (EGD) WITH PROPOFOL;  Surgeon: Jeani Hawking, MD;  Location: [facility];  Service: Endoscopy;  Laterality: N/A;  . ESOPHAGOGASTRODUODENOSCOPY (EGD) WITH PROPOFOL N/A 03/15/2018   Procedure: ESOPHAGOGASTRODUODENOSCOPY  (EGD) WITH PROPOFOL;  Surgeon: Jeani Hawking, MD;  Location: [facility];  Service: Endoscopy;  Laterality: N/A;  . HEMORRHOID SURGERY    . HERNIA REPAIR    . HERNIA REPAIR       No outpatient medications have been marked as taking for the 04/04/19 encounter (Appointment) with Pricilla Riffle, MD.     Allergies:   Fish allergy; Peanuts [peanut oil]; Penicillins; Iodinated diagnostic agents; Iodine; Percocet [oxycodone-acetaminophen]; Hydrocodone; Other; Oxycodone; and Sulfa antibiotics   Social History   Tobacco Use  . Smoking status: Former Smoker    Packs/day: 0.75    Years: 25.00    Pack years: 18.75    Types: Cigarettes    Last attempt to quit: 01/12/2011    Years since quitting: 8.2  . Smokeless tobacco: Never Used  Substance Use Topics  . Alcohol use: Yes    Alcohol/week: 4.0 standard drinks    Types: 4 Cans of beer per week    Comment: heavy drinker in the past  . Drug use: No     Family Hx: The patient's family history includes Asthma in an other family member; COPD in an other family member; Diabetes in her mother; Heart disease in an other family member; Heart failure in her mother; Hypertension in an other family member; Stroke in an other family member; Throat cancer in her brother.  ROS:   Please see the history of present illness.     All other systems reviewed and are negative.   Prior CV studies:   The following studies were reviewed today:    Labs/Other Tests and Data Reviewed:    EKG:  No ECG reviewed.  Recent Labs: 09/02/2018: ALT 53; BUN 50; Creatinine, Ser 2.88; Hemoglobin 10.4; Platelets 400; Potassium 3.4; Sodium 137   Recent Lipid Panel Lab Results  Component Value Date/Time   CHOL 161 02/23/2017 04:42 PM   TRIG 106 02/23/2017 04:42 PM   HDL 48 02/23/2017 04:42 PM   CHOLHDL 3.4 02/23/2017 04:42 PM   CHOLHDL 3 05/07/2015 12:19 PM   LDLCALC 92 02/23/2017 04:42 PM    Wt Readings from Last 3 Encounters:  12/10/18 208 lb 1.9 oz (94.4 kg)   09/02/18 223 lb (101.2 kg)  07/18/18 223 lb 9.6 oz (101.4 kg)     Objective:    Vital Signs:  There were no vitals taken for this visit.   No vital signs done at visit   Earlier today   BP today 171/    ASSESSMENT & PLAN:    1.  Rhythm   Pt wih some accelerated junctional at times   No dizzines  Will check labs    2  CAD  On CT scan  No symptoms to sugg angina    3   HTN  BP is high now  New she says  Not sure on reliabiltiy of cuff   Would like to get verified   Also needs labs   Difficult to have pt get out since needs ride   Will see about home health   4   HL   Mixed  Will need lpids   5  COVID-19 Education: The signs and symptoms of COVID-19 were discussed with the patient and how to seek care for testing (follow up with PCP or arrange E-visit).  The importance of social distancing was discussed today.  Time:   Today, I have spent 25 minutes with the patient with telehealth technology discussing the above problems.     Medication Adjustments/Labs and Tests Ordered: Current medicines are reviewed at length with the patient today.  Concerns regarding medicines are outlined above.   Tests Ordered: No orders of the defined types were placed in this encounter.   Medication Changes: No orders of the defined types were placed in this encounter.   Disposition:  Follow up depends on BP readings  Signed, Dietrich Pates, MD  04/04/2019 12:27 AM    [facility]

## 2019-04-08 DIAGNOSIS — E1129 Type 2 diabetes mellitus with other diabetic kidney complication: Secondary | ICD-10-CM | POA: Diagnosis not present

## 2019-04-08 DIAGNOSIS — J302 Other seasonal allergic rhinitis: Secondary | ICD-10-CM | POA: Diagnosis not present

## 2019-04-08 DIAGNOSIS — E782 Mixed hyperlipidemia: Secondary | ICD-10-CM | POA: Diagnosis not present

## 2019-04-08 DIAGNOSIS — J45909 Unspecified asthma, uncomplicated: Secondary | ICD-10-CM | POA: Diagnosis not present

## 2019-04-08 DIAGNOSIS — E559 Vitamin D deficiency, unspecified: Secondary | ICD-10-CM | POA: Diagnosis not present

## 2019-04-08 DIAGNOSIS — G4733 Obstructive sleep apnea (adult) (pediatric): Secondary | ICD-10-CM | POA: Diagnosis not present

## 2019-04-08 DIAGNOSIS — R1013 Epigastric pain: Secondary | ICD-10-CM | POA: Diagnosis not present

## 2019-04-08 DIAGNOSIS — I119 Hypertensive heart disease without heart failure: Secondary | ICD-10-CM | POA: Diagnosis not present

## 2019-04-08 DIAGNOSIS — G8929 Other chronic pain: Secondary | ICD-10-CM | POA: Diagnosis not present

## 2019-04-08 NOTE — Telephone Encounter (Signed)
Pt called today and wanted to know when her first home visit will occur.

## 2019-04-08 NOTE — Telephone Encounter (Signed)
Should be deferred to PCP for refills.

## 2019-04-09 DIAGNOSIS — I10 Essential (primary) hypertension: Secondary | ICD-10-CM | POA: Diagnosis not present

## 2019-04-09 DIAGNOSIS — E782 Mixed hyperlipidemia: Secondary | ICD-10-CM | POA: Diagnosis not present

## 2019-04-09 LAB — LIPID PANEL

## 2019-04-09 NOTE — Progress Notes (Signed)
Benita Stabile. [place]      DOB: 01-14-1946  Purpose of Visit: On behalf of [facility] Cardiac provider: Dr. Dietrich Pates  PMD: Dr. Leanor Kail  Medications: Is the patient taking all medications listed on [facility] from Epic? Yes  List any medications that are not being taken correctly:no  List any medication refills needed: no  Is the patient able to pick up medications? Yes, has daughter drive.  Vitals: BP:  152/78   HR: 69   Oxygen:  94% RA Weight:  218.6 lbs.  Pt didn't have a scale.  New digital scale given to Ms. Romaniello by [facility] so she can keep up with her weight.         Physical Exam:  Lung sounds: clear  Heart sounds: regular  Peripheral edema: no  Wounds: no  Location:  Any patient concerns?  She c/o insomnia and an irregular sleep pattern and states she has let her PMD know.  She has been taking melatonin 5mg  QHS which she says does nothing for her.  Verified that up to 10mg  can be taken per the OTC's website.  Suggested that Ms. Scholer try to see if she increases her dose to see if that helps her develop a more regular sleep pattern along w/not drinking caffeine after early afternoon hours.   [facility] Assessment form filled out and will be faxed to the ordering provider.    ReDS Vest/Clip Reading:  n/a  Rhythm Strip: n/a  Is Home Health recommended? No If yes, state reason:   Rose Fillers, RN 04/09/19

## 2019-04-10 LAB — LIPID PANEL
Chol/HDL Ratio: 2.9 ratio (ref 0.0–4.4)
Cholesterol, Total: 131 mg/dL (ref 100–199)
HDL: 45 mg/dL (ref >39)
LDL Calculated: 72 mg/dL (ref 0–99)
Triglycerides: 71 mg/dL (ref 0–149)
VLDL Cholesterol Cal: 14 mg/dL (ref 5–40)

## 2019-04-10 LAB — BASIC METABOLIC PANEL
BUN/Creatinine Ratio: 16 (ref 12–28)
BUN: 19 mg/dL (ref 8–27)
CO2: 27 mmol/L (ref 20–29)
Calcium: 9.5 mg/dL (ref 8.7–10.3)
Chloride: 102 mmol/L (ref 96–106)
Creatinine, Ser: 1.19 mg/dL — ABNORMAL HIGH (ref 0.57–1.00)
GFR calc Af Amer: 53 mL/min/{1.73_m2} — ABNORMAL LOW (ref >59)
GFR calc non Af Amer: 46 mL/min/{1.73_m2} — ABNORMAL LOW (ref >59)
Glucose: 90 mg/dL (ref 65–99)
Potassium: 4.5 mmol/L (ref 3.5–5.2)
Sodium: 146 mmol/L — ABNORMAL HIGH (ref 134–144)

## 2019-04-10 LAB — CBC
Hematocrit: 32.4 % — ABNORMAL LOW (ref 34.0–46.6)
Hemoglobin: 10.9 g/dL — ABNORMAL LOW (ref 11.1–15.9)
MCH: 31.1 pg (ref 26.6–33.0)
MCHC: 33.6 g/dL (ref 31.5–35.7)
MCV: 93 fL (ref 79–97)
Platelets: 281 10*3/uL (ref 150–450)
RBC: 3.5 x10E6/uL — ABNORMAL LOW (ref 3.77–5.28)
RDW: 13.3 % (ref 11.7–15.4)
WBC: 6 10*3/uL (ref 3.4–10.8)

## 2019-04-10 LAB — PRO B NATRIURETIC PEPTIDE: NT-Pro BNP: 260 pg/mL (ref 0–301)

## 2019-04-10 LAB — TSH: TSH: 3.47 u[IU]/mL (ref 0.450–4.500)

## 2019-04-15 ENCOUNTER — Other Ambulatory Visit: Payer: Self-pay

## 2019-04-15 ENCOUNTER — Telehealth: Payer: Self-pay

## 2019-04-15 NOTE — Telephone Encounter (Signed)
Contacted pharmacy to discuss the medication Voltaren Gel. Pharmacy stated the medication was sent out to pt but has removed all refills of the medication and stated that she will let the pt know to contact her primary for more refills.

## 2019-04-15 NOTE — Telephone Encounter (Signed)
CALLED PHARMACY TO DISCUSS MEDICATIONS SYMBICORT AND VOLTAREN GEL. PHARMACY STATES MEDICATION SENT OUT ALREADY BUT WILL REMOVE ALL OTHER REFILLS AND HAVE PT TO CONTACT PRIMARY FOR ANY REFILLS ON THESE MEDICATION

## 2019-04-15 NOTE — Telephone Encounter (Signed)
Contacted Pharmacy to remove refills on Symbicort inhaler and for pt to call primary for more refills

## 2019-04-15 NOTE — Telephone Encounter (Signed)
This encounter was created in error - please disregard.

## 2019-04-15 NOTE — Telephone Encounter (Addendum)
Contacted the pharmacy regarding Voltaren and Symbicort to remove all refills sent by our office. Pharmacy will remove refills and have pt to contact primary for additional refills on these medications

## 2019-05-28 ENCOUNTER — Telehealth: Payer: Self-pay | Admitting: Internal Medicine

## 2019-05-28 NOTE — Telephone Encounter (Signed)
SInce pt has difficulties with transport, can she have another home BP check.    Start amlodipine 2.5 mg  Keep on other meds  F/U with home monitor in 4 wks

## 2019-05-29 NOTE — Telephone Encounter (Addendum)
The first home # [phone] has been changed.  No new number given on recording. Called the second list home number: [phone]. Rang many times and then disconnected.  Will try back another time.

## 2019-06-02 NOTE — Telephone Encounter (Signed)
Spoke with patient's daughter and corrected phone numbers.  Pt was asleep. The correct number for patient is her mobile: [phone].  Daughter checked and pt does have bp cuff at home. I asked that pt check BP 2x a day for one week and call back in one week with BP readings. Daughter Janalyn Rouse will instruct pt.

## 2019-06-11 ENCOUNTER — Other Ambulatory Visit: Payer: Self-pay | Admitting: Internal Medicine

## 2019-06-27 ENCOUNTER — Other Ambulatory Visit: Payer: Self-pay | Admitting: Internal Medicine

## 2019-06-30 ENCOUNTER — Telehealth: Payer: Self-pay | Admitting: Internal Medicine

## 2019-06-30 ENCOUNTER — Other Ambulatory Visit: Payer: Self-pay | Admitting: Internal Medicine

## 2019-06-30 NOTE — Telephone Encounter (Signed)
New Message    158/82, 167/82, 157/80, 167/84, 171/70 Patient is is calling to provide her BP readings. [phone] is an alternate contact number.

## 2019-06-30 NOTE — Telephone Encounter (Signed)
Will route to Dr. Harrington Challenger to review BP readings.

## 2019-07-01 NOTE — Telephone Encounter (Signed)
BP reviewed  Would recomm trial of amlodipine 2.5 mg daily in addition to other meds. COntinue to monitor BP at home   Call/email in 3 to 4 wks

## 2019-07-02 ENCOUNTER — Other Ambulatory Visit: Payer: Self-pay | Admitting: Internal Medicine

## 2019-07-03 MED ORDER — AMLODIPINE BESYLATE 2.5 MG PO TABS: 2.5000 mg | ORAL_TABLET | Freq: Every day | ORAL | 3 refills | Status: DC

## 2019-07-03 NOTE — Telephone Encounter (Signed)
Called and spoke with patient. She will start amlodipine 2.5 mg daily and monitor BP. She will call back after about 3-4 weeks with readings. Thanked me for the call.

## 2019-07-03 NOTE — Telephone Encounter (Signed)
Please advise on refill request. Okay to refill or should this be deferred to PCP? Thanks, MI

## 2019-07-03 NOTE — Telephone Encounter (Signed)
PCP can provide

## 2019-07-10 ENCOUNTER — Telehealth: Payer: Self-pay | Admitting: Internal Medicine

## 2019-07-10 NOTE — Telephone Encounter (Addendum)
LM for patient to call back to see which labs she is having drawn at home with Home Care....she was started on Amlodipine 2.5mg  on 07/03/19.Marland Kitchen will check with Dr. Harrington Challenger to see if she would like any new labs.

## 2019-07-10 NOTE — Telephone Encounter (Signed)
[place] and spoke with [facility] and she reports the pt is not yet in their system... I called the pt back and no answer on her home phone and fast busy signal on her cell.. will continue to try her back.

## 2019-07-10 NOTE — Telephone Encounter (Signed)
BMET is OK

## 2019-07-10 NOTE — Telephone Encounter (Signed)
  Patient is calling because her PCP is going to have home health come out to get her blood work and vitals. She wanted to see if Dr Harrington Challenger would need to do that for her also.  [facility] [phone] is who will be coming out to her house.

## 2019-07-24 ENCOUNTER — Other Ambulatory Visit: Payer: Self-pay | Admitting: Internal Medicine

## 2019-07-24 ENCOUNTER — Telehealth: Payer: Self-pay | Admitting: Internal Medicine

## 2019-07-24 NOTE — Telephone Encounter (Signed)
Pt is asking about a list of her medications.  We reviewed her bp meds and found that she has a bottle of metoprolol tartrate 25 mg that she has been taking daily.  She is unsure who prescribed but she's been on it for a while.  Confirmed all other bp medications.   Has not started amlodipine 2.5 mg yet because she was concerned that this makes the 4th one. Her most recent BP readings: 134/69, 156/83, 130/68, 159/75  I updated her medication list.   Adv pt to start amlodipine 2.5 mg daily and continue to monitor pressure. She has the cuff provided by [facility] patient care fund. Adv her to call on 08/04/19 with update.  She asked about home care services for labs, vs.  Adv that Dr. Harrington Challenger would not prescribe home care services at this time for vitals / labs.  Adv that perhaps her PCP would order if he feels necessary.    Offered in office visit to review meds/BP but she is afraid to come to office and needs to use transportation where others are in close proximity to her and she is afraid right now due to pandemic.

## 2019-07-24 NOTE — Telephone Encounter (Signed)
° °  Patient wants a list of her medications. Patient would also like to have lab work done by home health agaency

## 2019-07-24 NOTE — Telephone Encounter (Signed)
Per pt call stated she has some questions about the 4 blood pressure medications she is taking please give her a call back.   Pt c/o medication issue:  1. Name of Medication: per pt BP meds. 4 of them  2. How are you currently taking this medication (dosage and times per day)? Not that is her question   3. Are you having a reaction (difficulty breathing--STAT)?  Wants to know if she can start today  4. What is your medication issue? Application

## 2019-07-28 DIAGNOSIS — K740 Hepatic fibrosis: Secondary | ICD-10-CM | POA: Diagnosis not present

## 2019-07-29 NOTE — Telephone Encounter (Signed)
Follow up     Pt is returning call and asking to speak with Anita Gonzalez    Please call

## 2019-07-29 NOTE — Telephone Encounter (Signed)
The patient is calling and would like to speak to Dr Harrington Challenger.  She would not be specific, but I told her that I would relay a message.  Patient # [phone].

## 2019-07-30 NOTE — Telephone Encounter (Signed)
Spoke to pt about BP meds   Concerned about number   Amlodipine  157/73   170/85    143/64   132/73   126/76   REcom:  Keep takng blood pressure  Call back in a couple with blood pressure readings  If better will not change    If high will need to adjust meds

## 2019-07-31 DIAGNOSIS — R1013 Epigastric pain: Secondary | ICD-10-CM | POA: Diagnosis not present

## 2019-07-31 DIAGNOSIS — G8929 Other chronic pain: Secondary | ICD-10-CM | POA: Diagnosis not present

## 2019-07-31 DIAGNOSIS — J45909 Unspecified asthma, uncomplicated: Secondary | ICD-10-CM | POA: Diagnosis not present

## 2019-07-31 DIAGNOSIS — E1129 Type 2 diabetes mellitus with other diabetic kidney complication: Secondary | ICD-10-CM | POA: Diagnosis not present

## 2019-07-31 DIAGNOSIS — G4733 Obstructive sleep apnea (adult) (pediatric): Secondary | ICD-10-CM | POA: Diagnosis not present

## 2019-07-31 DIAGNOSIS — I119 Hypertensive heart disease without heart failure: Secondary | ICD-10-CM | POA: Diagnosis not present

## 2019-07-31 DIAGNOSIS — J302 Other seasonal allergic rhinitis: Secondary | ICD-10-CM | POA: Diagnosis not present

## 2019-07-31 DIAGNOSIS — E559 Vitamin D deficiency, unspecified: Secondary | ICD-10-CM | POA: Diagnosis not present

## 2019-07-31 DIAGNOSIS — E782 Mixed hyperlipidemia: Secondary | ICD-10-CM | POA: Diagnosis not present

## 2019-08-12 ENCOUNTER — Telehealth: Payer: Self-pay | Admitting: Internal Medicine

## 2019-08-12 NOTE — Telephone Encounter (Signed)
New Message:    Pt says she needs to know what medicine she is supposed to be taking and how much she is supposed to be taking please. She need to know this today if possible.    She also said somebody here at the office told her that home care nurses can come to her home to get her vitals and blood work done? She is very interested in getting this service.

## 2019-08-13 NOTE — Telephone Encounter (Signed)
I spoke with patient who has still started amlodipine 162/74, 67  Metoprolol 25 mg Losartan 50 (two tablets for 100 mg) - per primary care doctor, pt was given this direction last Friday. Verapamil 180 mg once a day- has been taking once a day instead of twice because the pharmacy filled the bottle for the ONCE A DAY prescription.   Will review w Dr. Harrington Challenger. Will make sure medicine list is accurate and updated and will mail an updated copy to patient. She is wheezing on the phone and I asked her to use her nebulizer.  She will but reports that she has asked her primary doctor to help her get a new one because it doesn't stay charged after one use.

## 2019-08-14 ENCOUNTER — Telehealth (INDEPENDENT_AMBULATORY_CARE_PROVIDER_SITE_OTHER): Payer: Medicare HMO | Admitting: Internal Medicine

## 2019-08-14 ENCOUNTER — Other Ambulatory Visit: Payer: Self-pay

## 2019-08-14 ENCOUNTER — Telehealth: Payer: Self-pay | Admitting: Internal Medicine

## 2019-08-14 VITALS — BP 102/62 | HR 53

## 2019-08-14 DIAGNOSIS — I498 Other specified cardiac arrhythmias: Secondary | ICD-10-CM | POA: Diagnosis not present

## 2019-08-14 NOTE — Patient Instructions (Signed)
Medication Instructions:  Your physician recommends that you continue on your current medications as directed. Please refer to the Current Medication list given to you today.  If you need a refill on your cardiac medications before your next appointment, please call your pharmacy.   Lab work: We will need a copy of lab work that is being drawn Tuesday 08/19/19  If you have labs (blood work) drawn today and your tests are completely normal, you will receive your results only by: Marland Kitchen MyChart Message (if you have MyChart) OR . A paper copy in the mail If you have any lab test that is abnormal or we need to change your treatment, we will call you to review the results.  Testing/Procedures: Your physician has recommended that you wear a heart monitoring patch.  Heart monitors are medical devices that record the heart's electrical activity. Doctors most often use these monitors to diagnose arrhythmias. Arrhythmias are problems with the speed or rhythm of the heartbeat. The monitor is a small, portable device. You can wear one while you do your normal daily activities. This is usually used to diagnose what is causing palpitations/syncope (passing out).   Follow-Up:  Follow up with your physician will depend on test results.  Any Other Special Instructions Will Be Listed Below (If Applicable). none

## 2019-08-14 NOTE — Progress Notes (Signed)
Virtual Visit via Telephone Note   This visit type was conducted due to national recommendations for restrictions regarding the COVID-19 Pandemic (e.g. social distancing) in an effort to limit this patient's exposure and mitigate transmission in our community.  Due to her co-morbid illnesses, this patient is at least at moderate risk for complications without adequate follow up.  This format is felt to be most appropriate for this patient at this time.  The patient did not have access to video technology/had technical difficulties with video requiring transitioning to audio format only (telephone).  All issues noted in this document were discussed and addressed.  No physical exam could be performed with this format.  Please refer to the patient's chart for her  consent to telehealth for [facility].   Date:  08/14/2019   ID:  Anita Gonzalez, DOB [birth year], MRN [id number]  Patient Location: Home Provider Location: Home  PCP:  [name], MD Cardiologist:  Dietrich Pates, MD  Electrophysiologist:  None   Chief Complaint:  F/U for BP   History of Present Illness:    Anita Gonzalez is a 73 y.o. female with a history of CAD (on CT scan), DM, HTN, HL, COPD   Nuclear scan in 2018 showed no sischemia   She was last seen in clinic by Wende Mott on 12/10/18  EKG showed an accelerated junctional rhythm   72     When seen seh was in a junctional rhythm at 72 bpm   She was set up for a monitor   Monitor showed SR Occasional accleerated junctional I saw her last in a televisit in May   IN august the pt called in with BP readings of 120s to 140s   I recomm she try low dose amlodipine  2.5 mg  Plan to potentially use instead of verapamil given HR being slow The pt did not start amldopine  Did not want to add a med   Plus concerned about liver metabolism  Pt took Verapamil last nght   Not dizzy  BP 102/62  P 53   The patient does not have symptoms concerning for COVID-19 infection (fever, chills,  cough, or new shortness of breath).    Past Medical History:  Diagnosis Date  . Acute kidney injury (HCC) 01/11/2015  . Alcohol abuse, in remission   . Anemia   . Anemia, iron deficiency 01/28/2015  . Arthritis   . Asthma   . Bronchiectasis (HCC) 03/07/2016  . Bronchitis   . COPD (chronic obstructive pulmonary disease) (HCC)   . Diabetes mellitus without complication (HCC)    type 2  . DM II (diabetes mellitus, type II), controlled (HCC) 01/06/2015  . Dyspnea and respiratory abnormality 04/06/2015  . Dysrhythmia    hx of palpitations  . E-coli UTI 01/11/2015  . Essential hypertension 01/06/2015  . GERD (gastroesophageal reflux disease)   . Hay fever   . Hepatitis    c treated with pills  . HLD (hyperlipidemia) 01/06/2015  . Hypertension   . IBS (irritable bowel syndrome)   . ILD (interstitial lung disease) (HCC) 12/11/2016  . Leg cramps, sleep related 12/01/2015  . Pneumonia   . Rheumatoid arthritis (HCC) 1996  . Sinus tachycardia 01/11/2015   LT monitor 01/2019: normal sinus rhythm, occ short bursts of PAT, accelerated junctional rhythm, no sig arrhythmias.    Past Surgical History:  Procedure Laterality Date  . ABDOMINAL HYSTERECTOMY    . BALLOON DILATION N/A 03/15/2018   Procedure: BALLOON DILATION;  Surgeon: Jeani HawkingHung, Patrick, MD;  [facility];  Service: Endoscopy;  Laterality: N/A;  . CARPAL TUNNEL RELEASE Left 04/19/2018   Procedure: CARPAL TUNNEL RELEASE;  Surgeon: Coletta Memosabbell, Kyle, MD;  Location: [facility];  Service: Neurosurgery;  Laterality: Left;  CARPAL TUNNEL RELEASE  . CESAREAN SECTION    . COLONOSCOPY WITH PROPOFOL N/A 04/02/2015   Procedure: COLONOSCOPY WITH PROPOFOL;  Surgeon: Jeani HawkingPatrick Hung, MD;  Location: [facility];  Service: Endoscopy;  Laterality: N/A;  . ESOPHAGOGASTRODUODENOSCOPY (EGD) WITH PROPOFOL N/A 04/02/2015   Procedure: ESOPHAGOGASTRODUODENOSCOPY (EGD) WITH PROPOFOL;  Surgeon: Jeani HawkingPatrick Hung, MD;  Location: [facility];  Service: Endoscopy;  Laterality:  N/A;  . ESOPHAGOGASTRODUODENOSCOPY (EGD) WITH PROPOFOL N/A 03/15/2018   Procedure: ESOPHAGOGASTRODUODENOSCOPY (EGD) WITH PROPOFOL;  Surgeon: Jeani HawkingHung, Patrick, MD;  Location: [facility];  Service: Endoscopy;  Laterality: N/A;  . HEMORRHOID SURGERY    . HERNIA REPAIR    . HERNIA REPAIR       Current Meds  Medication Sig  . albuterol (PROVENTIL HFA;VENTOLIN HFA) 108 (90 BASE) MCG/ACT inhaler Inhale 2 puffs into the lungs every 6 (six) hours as needed for wheezing or shortness of breath (wheezing).   Marland Kitchen. aspirin EC 81 MG tablet Take 1 tablet (81 mg total) by mouth daily.  Marland Kitchen. atorvastatin (LIPITOR) 40 MG tablet ONE TABLET BY MOUTH DAILY   . bumetanide (BUMEX) 2 MG tablet Take 0.5 mg by mouth every other day.   . cyanocobalamin 1000 MCG tablet Take 1,000 mcg by mouth every Thursday.   . cyclobenzaprine (FLEXERIL) 10 MG tablet Take 10 mg by mouth at bedtime.   . diclofenac sodium (VOLTAREN) 1 % GEL APPLY 2 GRAM TO THE AFFECTED AREAS 4 TIMES A DAY AS NEEDED FOR KNEE PAIN   . diphenoxylate-atropine (LOMOTIL) 2.5-0.025 MG tablet Take 1 tablet by mouth 4 (four) times daily as needed for diarrhea or loose stools.   . docusate sodium (COLACE) 100 MG capsule Take 100 mg by mouth 2 (two) times daily as needed for mild constipation.  . ergocalciferol (VITAMIN D2) 50000 UNITS capsule Take 50,000 Units by mouth every Monday.   . ezetimibe (ZETIA) 10 MG tablet ONE TABLET BY MOUTH DAILY   . ferrous sulfate 325 (65 FE) MG tablet Take 325 mg by mouth 2 (two) times daily with a meal.   . fluticasone (FLONASE) 50 MCG/ACT nasal spray Place 1 spray into both nostrils daily as needed for allergies or rhinitis.  . folic acid (FOLVITE) 1 MG tablet TAKE 1 TABLET BY MOUTH DAILY   . ibuprofen (ADVIL) 800 MG tablet ONE TABLET BY MOUTH THREE TIMES A DAY AS NEEDED   . ipratropium-albuterol (DUONEB) 0.5-2.5 (3) MG/3ML SOLN Inhale 3 mLs into the lungs every 6 (six) hours as needed (for shortness of breath).   . losartan (COZAAR) 50  MG tablet Take 50 mg by mouth daily. Pt taking 100 mg - adv by PCP last week to do so.  Takes at night.  . metFORMIN (GLUCOPHAGE) 500 MG tablet Take 500 mg by mouth 2 (two) times daily.  . pantoprazole (PROTONIX) 40 MG tablet Take 40 mg by mouth daily.   . SYMBICORT 160-4.5 MCG/ACT inhaler INHALE 2 PUFFS BY MOUTH INTO THE LUNG 2 TIMES A DAY   . verapamil (CALAN-SR) 180 MG CR tablet Take 1 tablet (180 mg total) by mouth daily.  . [DISCONTINUED] metoprolol tartrate (LOPRESSOR) 25 MG tablet Take 25 mg by mouth daily. Takes at night     Allergies:   Fish allergy, Peanuts [peanut  oil], Penicillins, Iodinated diagnostic agents, Iodine, Percocet [oxycodone-acetaminophen], Hydrocodone, Other, Oxycodone, and Sulfa antibiotics   Social History   Tobacco Use  . Smoking status: Former Smoker    Packs/day: 0.75    Years: 25.00    Pack years: 18.75    Types: Cigarettes    Quit date: 01/12/2011    Years since quitting: 8.5  . Smokeless tobacco: Never Used  Substance Use Topics  . Alcohol use: Yes    Alcohol/week: 4.0 standard drinks    Types: 4 Cans of beer per week    Comment: heavy drinker in the past  . Drug use: No     Family Hx: The patient's family history includes Asthma in an other family member; COPD in an other family member; Diabetes in her mother; Heart disease in an other family member; Heart failure in her mother; Hypertension in an other family member; Stroke in an other family member; Throat cancer in her brother.  ROS:   Please see the history of present illness.     All other systems reviewed and are negative.     Labs/Other Tests and Data Reviewed:    EKG:  No ECG reviewed.  Recent Labs: 09/02/2018: ALT 53 04/09/2019: BUN 19; Creatinine, Ser 1.19; Hemoglobin 10.9; NT-Pro BNP 260; Platelets 281; Potassium 4.5; Sodium 146; TSH 3.470   Recent Lipid Panel Lab Results  Component Value Date/Time   CHOL 131 04/09/2019 03:30 PM   TRIG 71 04/09/2019 03:30 PM   HDL 45  04/09/2019 03:30 PM   CHOLHDL 2.9 04/09/2019 03:30 PM   CHOLHDL 3 05/07/2015 12:19 PM   LDLCALC 72 04/09/2019 03:30 PM    Wt Readings from Last 3 Encounters:  04/09/19 218 lb 9.6 oz (99.2 kg)  04/04/19 213 lb (96.6 kg)  12/10/18 208 lb 1.9 oz (94.4 kg)     Objective:    Vital Signs:  BP 102/62   Pulse (!) 53    VITAL SIGNS:  reviewed  ASSESSMENT & PLAN:    1. HTN  BP is good   She is not dizzy   WOuld continue   I encouraged her to continue to track    2  Rhythm   WIll set up for monitor to eval response on current regimen   Has been slow in past (junctional)  3  CAD  On CT scan  No symtpoms of angina    4  HL  Last LDL 72   FOllow     COVID-19 Education: The signs and symptoms of COVID-19 were discussed with the patient and how to seek care for testing (follow up with PCP or arrange E-visit).  The importance of social distancing was discussed today.  Time:   Today, I have spent 15 minutes with the patient with telehealth technology discussing the above problems.     Medication Adjustments/Labs and Tests Ordered: Current medicines are reviewed at length with the patient today.  Concerns regarding medicines are outlined above.   Tests Ordered: No orders of the defined types were placed in this encounter.   Medication Changes: No orders of the defined types were placed in this encounter.   Follow Up:    Signed, Dorris Carnes, MD  08/14/2019 12:19 PM    [place]

## 2019-08-14 NOTE — Telephone Encounter (Signed)
Called patient back. Adv that we did not tell her a HHN was coming.  She (the patient) told us this information. SHe thinks maybe it is [employer] that is sending someone. When they come Tues at 1pm she will ask them to send th results of the blood work to Dr. Harrington Challenger.  Pt will call back to let me know labs have been drawn.

## 2019-08-14 NOTE — Telephone Encounter (Addendum)
Reviewed with Dr. Harrington Challenger and called patient to plan for virtual visit today to review. Pt is agreeable with this plan.   She checked BP/HR while on phone today: 102/62, 53. Pt states that last night she took a 2nd verapamil and 50 mg losartan instead of 100 mg.

## 2019-08-14 NOTE — Telephone Encounter (Signed)
Follow Up:   She wanted to make sure that you said the Home Health Nurse was coming on Tuesday at 1:00.

## 2019-08-14 NOTE — Telephone Encounter (Signed)
Virtual Visit Pre-Appointment Phone Call  "(Name), I am calling you today to discuss your upcoming appointment. We are currently trying to limit exposure to the virus that causes COVID-19 by seeing patients at home rather than in the office."  1. "What is the BEST phone number to call the day of the visit?" - include this in appointment notes  2. "Do you have or have access to (through a family member/friend) a smartphone with video capability that we can use for your visit?" a. If yes - list this number in appt notes as "cell" (if different from BEST phone #) and list the appointment type as a VIDEO visit in appointment notes b. If no - list the appointment type as a PHONE visit in appointment notes  3. Confirm consent - "In the setting of the current Covid19 crisis, you are scheduled for a (phone or video) visit with your provider on (date) at (time).  Just as we do with many in-office visits, in order for you to participate in this visit, we must obtain consent.  If you'd like, I can send this to your mychart (if signed up) or email for you to review.  Otherwise, I can obtain your verbal consent now.  All virtual visits are billed to your insurance company just like a normal visit would be.  By agreeing to a virtual visit, we'd like you to understand that the technology does not allow for your provider to perform an examination, and thus may limit your provider's ability to fully assess your condition. If your provider identifies any concerns that need to be evaluated in person, we will make arrangements to do so.  Finally, though the technology is pretty good, we cannot assure that it will always work on either your or our end, and in the setting of a video visit, we may have to convert it to a phone-only visit.  In either situation, we cannot ensure that we have a secure connection.  Are you willing to proceed?" STAFF: Did the patient verbally acknowledge consent to telehealth visit? Document  YES/NO here: YES  4. Advise patient to be prepared - "Two hours prior to your appointment, go ahead and check your blood pressure, pulse, oxygen saturation, and your weight (if you have the equipment to check those) and write them all down. When your visit starts, your provider will ask you for this information. If you have an Apple Watch or Kardia device, please plan to have heart rate information ready on the day of your appointment. Please have a pen and paper handy nearby the day of the visit as well."  5. Give patient instructions for MyChart download to smartphone OR Doximity/Doxy.me as below if video visit (depending on what platform provider is using)  6. Inform patient they will receive a phone call 15 minutes prior to their appointment time (may be from unknown caller ID) so they should be prepared to answer    TELEPHONE CALL NOTE  Anita Gonzalez has been deemed a candidate for a follow-up tele-health visit to limit community exposure during the Covid-19 pandemic. I spoke with the patient via phone to ensure availability of phone/video source, confirm preferred email & phone number, and discuss instructions and expectations.  I reminded Anita Gonzalez to be prepared with any vital sign and/or heart rhythm information that could potentially be obtained via home monitoring, at the time of her visit. I reminded Anita Gonzalez to expect a phone call prior to  her visit.  Lendon Ka, RN 08/14/2019 10:52 AM   INSTRUCTIONS FOR DOWNLOADING THE MYCHART APP TO SMARTPHONE  - The patient must first make sure to have activated MyChart and know their login information - If Apple, go to [employer] and type in MyChart in the search bar and download the app. If Android, ask patient to go to [employer] and type in [place] in the search bar and download the app. The app is free but as with any other app downloads, their phone may require them to verify saved payment information or Apple/Android  password.  - The patient will need to then log into the app with their MyChart username and password, and select [facility] as their healthcare provider to link the account. When it is time for your visit, go to the MyChart app, find appointments, and click Begin Video Visit. Be sure to Select Allow for your device to access the Microphone and Camera for your visit. You will then be connected, and your provider will be with you shortly.  **If they have any issues connecting, or need assistance please contact MyChart service desk [phone])**  **If using a computer, in order to ensure the best quality for their visit they will need to use either of the following Internet Browsers: [employer] or Google Chrome**  IF USING DOXIMITY or DOXY.ME - The patient will receive a link just prior to their visit by text.     FULL LENGTH CONSENT FOR TELE-HEALTH VISIT   I hereby voluntarily request, consent and authorize [facility] and its employed or contracted physicians, physician assistants, nurse practitioners or other licensed health care professionals (the Practitioner), to provide me with telemedicine health care services (the "Services") as deemed necessary by the treating Practitioner. I acknowledge and consent to receive the Services by the Practitioner via telemedicine. I understand that the telemedicine visit will involve communicating with the Practitioner through live audiovisual communication technology and the disclosure of certain medical information by electronic transmission. I acknowledge that I have been given the opportunity to request an in-person assessment or other available alternative prior to the telemedicine visit and am voluntarily participating in the telemedicine visit.  I understand that I have the right to withhold or withdraw my consent to the use of telemedicine in the course of my care at any time, without affecting my right to future care or treatment,  and that the Practitioner or I may terminate the telemedicine visit at any time. I understand that I have the right to inspect all information obtained and/or recorded in the course of the telemedicine visit and may receive copies of available information for a reasonable fee.  I understand that some of the potential risks of receiving the Services via telemedicine include:  Marland Kitchen Delay or interruption in medical evaluation due to technological equipment failure or disruption; . Information transmitted may not be sufficient (e.g. poor resolution of images) to allow for appropriate medical decision making by the Practitioner; and/or  . In rare instances, security protocols could fail, causing a breach of personal health information.  Furthermore, I acknowledge that it is my responsibility to provide information about my medical history, conditions and care that is complete and accurate to the best of my ability. I acknowledge that Practitioner's advice, recommendations, and/or decision may be based on factors not within their control, such as incomplete or inaccurate data provided by me or distortions of diagnostic images or specimens that may result from electronic transmissions. I  understand that the practice of medicine is not an exact science and that Practitioner makes no warranties or guarantees regarding treatment outcomes. I acknowledge that I will receive a copy of this consent concurrently upon execution via email to the email address I last provided but may also request a printed copy by calling the office of [facility].    I understand that my insurance will be billed for this visit.   I have read or had this consent read to me. . I understand the contents of this consent, which adequately explains the benefits and risks of the Services being provided via telemedicine.  . I have been provided ample opportunity to ask questions regarding this consent and the Services and have had my questions  answered to my satisfaction. . I give my informed consent for the services to be provided through the use of telemedicine in my medical care  By participating in this telemedicine visit I agree to the above.

## 2019-08-20 NOTE — Progress Notes (Signed)
Ms. Giacobbe had called Jenny Reichmann, RN w/[facility] about a lab slip she forgot she had while Jenny Reichmann was out doing a home visit a day or 2 ago.  I was asked to see Ms. Cissell today to draw these labs.  On arrival, Ms. Welles handed the lab requisition dated 05/20/19 from the [facility] liver center of [place] and ordered by Lindell Spar, NP.  Called the phone number listed for the liver center to see if these labs are still needed to be drawn, but wasn't able to get through to anyone.  Will route this not to Ms. Beverley Fiedler, NP so she is aware these had never been drawn.

## 2019-08-26 ENCOUNTER — Telehealth: Payer: Self-pay | Admitting: *Deleted

## 2019-08-26 NOTE — Telephone Encounter (Signed)
3 day ZIO XT long term holter monitor to be mailed to the patients home.  Instructions reviewed briefly as they are included in the monitor kit. 

## 2019-08-27 ENCOUNTER — Telehealth: Payer: Self-pay | Admitting: Internal Medicine

## 2019-08-27 NOTE — Telephone Encounter (Signed)
Spoke with [facility] with [facility].  Adv that Dr. Harrington Challenger has not requested any blood work for patient.  Pt had stated that a [employer] provider was coming to her home to assess her and draw labs - at that time I told her to ask the [facility] provider to forward a copy of the labs to her her cardiologist.  Otherwise, [facility] does not need to draw any labs from Anita Gonzalez at this time.

## 2019-08-27 NOTE — Telephone Encounter (Signed)
Anita Gonzalez, from [facility] can be reached at  [phone]. Patient states labs should be drawn that Dr. Harrington Challenger ordered Anita Gonzalez states there is nothing she is aware of.  If we want labs drawn an order needs to be placed.  Anita Gonzalez states she did speak to [employer] yesterday about this patient for a different matter.

## 2019-09-05 ENCOUNTER — Ambulatory Visit (INDEPENDENT_AMBULATORY_CARE_PROVIDER_SITE_OTHER): Payer: Medicare HMO

## 2019-09-05 DIAGNOSIS — I498 Other specified cardiac arrhythmias: Secondary | ICD-10-CM

## 2019-09-15 DIAGNOSIS — K7469 Other cirrhosis of liver: Secondary | ICD-10-CM | POA: Diagnosis not present

## 2019-09-16 ENCOUNTER — Other Ambulatory Visit: Payer: Self-pay | Admitting: Internal Medicine

## 2019-09-16 ENCOUNTER — Other Ambulatory Visit: Payer: Self-pay | Admitting: Physician Assistant

## 2019-09-19 ENCOUNTER — Telehealth: Payer: Self-pay | Admitting: Internal Medicine

## 2019-09-19 NOTE — Telephone Encounter (Signed)
Pt aware forwarding Dr. Harrington Challenger for advisement.  Aware it may be next week before return call. Patient verbalized understanding and agreeable to plan.

## 2019-09-19 NOTE — Telephone Encounter (Signed)
New Message   Patient is calling because [facility] came out and prescribed her some medication. She wants to make sure that Dr. Harrington Challenger is aware.  The medications are methylpred and z-pak. She just wants to be sure that its okay for her to take.

## 2019-09-19 NOTE — Progress Notes (Signed)
After multiple phone calls and VM's left,  was finally able to reach Ms. Ammons in order to draw labs that had been ordered by [facility] and forwarded to [facility].  While there, Ms. Vey asked about zpak and methylpredisone that was ordered by Glory Buff, DNP w/[facility] earlier in the week.  I told her I would forward her concerns to Sharyn Lull so she could call and talk to her about it.   Labs were drawn and brought to the [facility] in [facility].

## 2019-09-20 DIAGNOSIS — I4891 Unspecified atrial fibrillation: Secondary | ICD-10-CM | POA: Diagnosis not present

## 2019-09-22 NOTE — Telephone Encounter (Signed)
Will route to PharmD to confirm no interaction/safety concerns with taking steriod and zpak.

## 2019-09-22 NOTE — Telephone Encounter (Signed)
Ok for pt to take meds, no issues noted.

## 2019-09-22 NOTE — Telephone Encounter (Signed)
Called and let patient know that it is okay for pt to take zpak and steriod.   She asked about results of heart monitor.  Advised results have not been uploaded to Dr. Harrington Challenger yet but we call her once reviewed by MD.

## 2019-09-25 ENCOUNTER — Other Ambulatory Visit: Payer: Self-pay

## 2019-09-25 ENCOUNTER — Telehealth: Payer: Self-pay

## 2019-09-25 NOTE — Telephone Encounter (Signed)
LMTCB

## 2019-09-25 NOTE — Telephone Encounter (Signed)
-----   Message from Dorris Carnes V, MD sent at 09/24/2019 10:00 PM EST ----- No significant pauses   Average HR 66 bpm  This is dangerously slow but she may have more energy if slow Would cut back back on Toprol   She is on 25 mg   Cut to 12.5 for 1 wk   Then off Follow BP

## 2019-11-03 ENCOUNTER — Other Ambulatory Visit: Payer: Self-pay | Admitting: Internal Medicine

## 2019-12-22 ENCOUNTER — Other Ambulatory Visit: Payer: Self-pay | Admitting: Internal Medicine

## 2019-12-23 NOTE — Telephone Encounter (Signed)
Pt's pharmacy is requesting a refill on Vitamin D and Losartan. The pt's PCP changed the directions for pt to take 100 mg tablet daily. Would Dr. Tenny Craw like to refill these medications? Please address

## 2020-01-07 DIAGNOSIS — E782 Mixed hyperlipidemia: Secondary | ICD-10-CM | POA: Diagnosis not present

## 2020-01-07 DIAGNOSIS — Z0001 Encounter for general adult medical examination with abnormal findings: Secondary | ICD-10-CM | POA: Diagnosis not present

## 2020-01-07 DIAGNOSIS — J302 Other seasonal allergic rhinitis: Secondary | ICD-10-CM | POA: Diagnosis not present

## 2020-01-07 DIAGNOSIS — E559 Vitamin D deficiency, unspecified: Secondary | ICD-10-CM | POA: Diagnosis not present

## 2020-01-07 DIAGNOSIS — J4540 Moderate persistent asthma, uncomplicated: Secondary | ICD-10-CM | POA: Diagnosis not present

## 2020-01-07 DIAGNOSIS — I119 Hypertensive heart disease without heart failure: Secondary | ICD-10-CM | POA: Diagnosis not present

## 2020-01-07 DIAGNOSIS — E1129 Type 2 diabetes mellitus with other diabetic kidney complication: Secondary | ICD-10-CM | POA: Diagnosis not present

## 2020-01-07 DIAGNOSIS — R1013 Epigastric pain: Secondary | ICD-10-CM | POA: Diagnosis not present

## 2020-01-07 DIAGNOSIS — G4733 Obstructive sleep apnea (adult) (pediatric): Secondary | ICD-10-CM | POA: Diagnosis not present

## 2020-01-27 DIAGNOSIS — G4733 Obstructive sleep apnea (adult) (pediatric): Secondary | ICD-10-CM | POA: Diagnosis not present

## 2020-01-27 DIAGNOSIS — J302 Other seasonal allergic rhinitis: Secondary | ICD-10-CM | POA: Diagnosis not present

## 2020-01-27 DIAGNOSIS — I119 Hypertensive heart disease without heart failure: Secondary | ICD-10-CM | POA: Diagnosis not present

## 2020-01-27 DIAGNOSIS — E559 Vitamin D deficiency, unspecified: Secondary | ICD-10-CM | POA: Diagnosis not present

## 2020-01-27 DIAGNOSIS — J4540 Moderate persistent asthma, uncomplicated: Secondary | ICD-10-CM | POA: Diagnosis not present

## 2020-01-27 DIAGNOSIS — E782 Mixed hyperlipidemia: Secondary | ICD-10-CM | POA: Diagnosis not present

## 2020-01-27 DIAGNOSIS — E1129 Type 2 diabetes mellitus with other diabetic kidney complication: Secondary | ICD-10-CM | POA: Diagnosis not present

## 2020-01-27 DIAGNOSIS — R1013 Epigastric pain: Secondary | ICD-10-CM | POA: Diagnosis not present

## 2020-01-28 ENCOUNTER — Telehealth: Payer: Self-pay | Admitting: Internal Medicine

## 2020-01-28 NOTE — Telephone Encounter (Signed)
New Message:     Please call, pt says she need to discuss her medicine with you.

## 2020-01-28 NOTE — Telephone Encounter (Signed)
No answer; ringing phone.  Unable to leave a message.

## 2020-01-28 NOTE — Telephone Encounter (Signed)
Pt calling requesting to talk with Dr. Tenny Craw concerning her medication and would not tell me what exactly what the issue was. Pt would like Dr. Tenny Craw to give her a call. Please address

## 2020-01-29 ENCOUNTER — Telehealth: Payer: Self-pay

## 2020-01-29 NOTE — Telephone Encounter (Signed)
Received message from Ms Memmer via [employer].  Returned call no answer vm left to call us back.

## 2020-02-02 ENCOUNTER — Other Ambulatory Visit: Payer: Self-pay | Admitting: Internal Medicine

## 2020-02-05 ENCOUNTER — Telehealth: Payer: Self-pay | Admitting: Internal Medicine

## 2020-02-05 NOTE — Telephone Encounter (Signed)
New Message:    Pt says she is allergic to penicillin and wants to see if Dr Tenny Craw thinks it is alright for her to take the COVID Vaccine because of this?

## 2020-02-05 NOTE — Telephone Encounter (Signed)
Patient states her daughter wanted to speak with me or Dr. Tenny Craw. I spoke with both of them She has concerns about getting vaccine because of her allergies-fish, peanut are anaphylaxis. They are checking with the patient's doctors to get their input. She's never had a reaction to flu vaccine.  Adv that we are recommending to all our patients to get vaccinated. She would be in the category to wait 30 min instead of 15 min afterward.   She would like to be at a bigger clinic where there would be emergency response if needed vs [facility].  I let her know they would all have epi on hand if she would have reaction.

## 2020-02-06 NOTE — Telephone Encounter (Signed)
Patient called back returning a call from our office °

## 2020-02-06 NOTE — Telephone Encounter (Signed)
Left VM   It is OK and I would recomm that she get vaccine against COVID The likelihood of an Rxn is low and should be apparent in first 15 minutes They will have epi and other meds available if she has a reaction.

## 2020-02-10 ENCOUNTER — Telehealth: Payer: Self-pay | Admitting: Internal Medicine

## 2020-02-10 NOTE — Telephone Encounter (Signed)
     I went in pt chart to see who pt talked to earlier this morning. Pt could not remember what she was told.

## 2020-02-10 NOTE — Telephone Encounter (Signed)
The patient did not listen to VM.  I gave her the message.  Appreciative for the information provided.

## 2020-03-05 ENCOUNTER — Other Ambulatory Visit: Payer: Self-pay | Admitting: Physician Assistant

## 2020-03-10 ENCOUNTER — Other Ambulatory Visit: Payer: Self-pay | Admitting: Internal Medicine

## 2020-04-26 DIAGNOSIS — E782 Mixed hyperlipidemia: Secondary | ICD-10-CM | POA: Diagnosis not present

## 2020-04-26 DIAGNOSIS — G4733 Obstructive sleep apnea (adult) (pediatric): Secondary | ICD-10-CM | POA: Diagnosis not present

## 2020-04-26 DIAGNOSIS — E559 Vitamin D deficiency, unspecified: Secondary | ICD-10-CM | POA: Diagnosis not present

## 2020-04-26 DIAGNOSIS — R1013 Epigastric pain: Secondary | ICD-10-CM | POA: Diagnosis not present

## 2020-04-26 DIAGNOSIS — E1129 Type 2 diabetes mellitus with other diabetic kidney complication: Secondary | ICD-10-CM | POA: Diagnosis not present

## 2020-04-26 DIAGNOSIS — J4540 Moderate persistent asthma, uncomplicated: Secondary | ICD-10-CM | POA: Diagnosis not present

## 2020-04-26 DIAGNOSIS — I119 Hypertensive heart disease without heart failure: Secondary | ICD-10-CM | POA: Diagnosis not present

## 2020-04-26 DIAGNOSIS — J302 Other seasonal allergic rhinitis: Secondary | ICD-10-CM | POA: Diagnosis not present

## 2020-04-26 DIAGNOSIS — Z0000 Encounter for general adult medical examination without abnormal findings: Secondary | ICD-10-CM | POA: Diagnosis not present

## 2020-04-30 ENCOUNTER — Telehealth: Payer: Self-pay

## 2020-04-30 NOTE — Telephone Encounter (Signed)
Ms Urey left vm requesting appt with Dr. Mosetta Putt.  Scheduling message sent.

## 2020-05-10 ENCOUNTER — Other Ambulatory Visit: Payer: Self-pay | Admitting: Internal Medicine

## 2020-05-10 NOTE — Telephone Encounter (Signed)
Pt's pharmacy is requesting a refill on vitamin D. Would Dr. Ross like to refill this medication? Please address °

## 2020-05-11 NOTE — Telephone Encounter (Signed)
Error

## 2020-05-14 NOTE — Progress Notes (Signed)
[facility]   Telephone:([phone]) [phone] Fax:([phone]   Clinic Follow up Note   Patient Care Team: [name], MD as PCP - General (Internal Medicine) Pricilla Riffle, MD as PCP - Cardiology (Cardiology) Jearld Lesch, MD as Referring Physician (Specialist) Jearld Lesch, MD as Referring Physician (Specialist) Jeani Hawking, MD as Consulting Physician (Gastroenterology) Clyde Lundborg., MD as Referring Physician (Anesthesiology) Larey Dresser, DPM as Consulting Physician (Podiatry)  Date of Service:  05/21/2020  CHIEF COMPLAINT: Follow-up iron deficient anemia  CURRENT THERAPY:  Oral iron Ferous sulfate BID, Folic Acid, oral B12 daily. IV Feraheme as needed, last in 07/2017.   INTERVAL HISTORY:  Anita Gonzalez is here for a follow up IDA. She was last seen by me in 07/2018. She lost follow up since then. She presents to the clinic with her family member. She notes she is doing well. She notes she feels significantly tired with no energy. She notes she has been feeling this way for over 1 year. She notes she did lose follow up last year. She denies any blood in her stool. She does note diffuse bruising which occur randomly. She is on Aspirin 81 mg. She notes she does not take B12.     REVIEW OF SYSTEMS:   Constitutional: Denies fevers, chills or abnormal weight loss Eyes: Denies blurriness of vision Ears, nose, mouth, throat, and face: Denies mucositis or sore throat Respiratory: Denies cough, dyspnea or wheezes Cardiovascular: Denies palpitation, chest discomfort or lower extremity swelling Gastrointestinal:  Denies nausea, heartburn or change in bowel habits Skin: Denies abnormal skin rashes Lymphatics: Denies new lymphadenopathy or easy bruising Neurological:Denies numbness, tingling or new weaknesses Behavioral/Psych: Mood is stable, no new changes  All other systems were reviewed with the patient and are negative.  MEDICAL  HISTORY:  Past Medical History:  Diagnosis Date  . Acute kidney injury (HCC) 01/11/2015  . Alcohol abuse, in remission   . Anemia   . Anemia, iron deficiency 01/28/2015  . Arthritis   . Asthma   . Bronchiectasis (HCC) 03/07/2016  . Bronchitis   . COPD (chronic obstructive pulmonary disease) (HCC)   . Diabetes mellitus without complication (HCC)    type 2  . DM II (diabetes mellitus, type II), controlled (HCC) 01/06/2015  . Dyspnea and respiratory abnormality 04/06/2015  . Dysrhythmia    hx of palpitations  . E-coli UTI 01/11/2015  . Essential hypertension 01/06/2015  . GERD (gastroesophageal reflux disease)   . Hay fever   . Hepatitis    c treated with pills  . HLD (hyperlipidemia) 01/06/2015  . Hypertension   . IBS (irritable bowel syndrome)   . ILD (interstitial lung disease) (HCC) 12/11/2016  . Leg cramps, sleep related 12/01/2015  . Pneumonia   . Rheumatoid arthritis (HCC) 1996  . Sinus tachycardia 01/11/2015   LT monitor 01/2019: normal sinus rhythm, occ short bursts of PAT, accelerated junctional rhythm, no sig arrhythmias.     SURGICAL HISTORY: Past Surgical History:  Procedure Laterality Date  . ABDOMINAL HYSTERECTOMY    . BALLOON DILATION N/A 03/15/2018   Procedure: BALLOON DILATION;  Surgeon: Jeani Hawking, MD;  Location: [facility];  Service: Endoscopy;  Laterality: N/A;  . CARPAL TUNNEL RELEASE Left 04/19/2018   Procedure: CARPAL TUNNEL RELEASE;  Surgeon: Coletta Memos, MD;  Location: [facility] OR;  Service: Neurosurgery;  Laterality: Left;  CARPAL TUNNEL RELEASE  . CESAREAN SECTION    . COLONOSCOPY WITH PROPOFOL N/A 04/02/2015   Procedure:  COLONOSCOPY WITH PROPOFOL;  Surgeon: Jeani Hawking, MD;  Location: [facility];  Service: Endoscopy;  Laterality: N/A;  . ESOPHAGOGASTRODUODENOSCOPY (EGD) WITH PROPOFOL N/A 04/02/2015   Procedure: ESOPHAGOGASTRODUODENOSCOPY (EGD) WITH PROPOFOL;  Surgeon: Jeani Hawking, MD;  Location: [facility];  Service: Endoscopy;  Laterality: N/A;  .  ESOPHAGOGASTRODUODENOSCOPY (EGD) WITH PROPOFOL N/A 03/15/2018   Procedure: ESOPHAGOGASTRODUODENOSCOPY (EGD) WITH PROPOFOL;  Surgeon: Jeani Hawking, MD;  Location: [facility];  Service: Endoscopy;  Laterality: N/A;  . HEMORRHOID SURGERY    . HERNIA REPAIR    . HERNIA REPAIR      I have reviewed the social history and family history with the patient and they are unchanged from previous note.  ALLERGIES:  is allergic to fish allergy, peanuts [peanut oil], penicillins, iodinated diagnostic agents, iodine, percocet [oxycodone-acetaminophen], hydrocodone, other, oxycodone, and sulfa antibiotics.  MEDICATIONS:  Current Outpatient Medications  Medication Sig Dispense Refill  . montelukast (SINGULAIR) 10 MG tablet Take 10 mg by mouth at bedtime.    Marland Kitchen albuterol (PROVENTIL) (2.5 MG/3ML) 0.083% nebulizer solution     . albuterol (VENTOLIN HFA) 108 (90 Base) MCG/ACT inhaler INHALE 2 PUFFS BY MOUTH EVERY 4-6 HRS AS NEEDED AS DIRECTED  54 g 5  . amLODipine (NORVASC) 2.5 MG tablet     . aspirin EC 81 MG tablet Take 1 tablet (81 mg total) by mouth daily. 90 tablet 3  . atorvastatin (LIPITOR) 40 MG tablet ONE TABLET BY MOUTH DAILY  90 tablet 3  . bumetanide (BUMEX) 2 MG tablet TAKE 1 TABLET BY MOUTH 2 TIMES A WEEK  24 tablet 1  . cyanocobalamin 1000 MCG tablet Take 1,000 mcg by mouth every Thursday.     . cyclobenzaprine (FLEXERIL) 10 MG tablet Take 10 mg by mouth at bedtime.     . diclofenac sodium (VOLTAREN) 1 % GEL APPLY 2 GRAM TO THE AFFECTED AREAS 4 TIMES A DAY AS NEEDED FOR KNEE PAIN  700 g 3  . diclofenac Sodium (VOLTAREN) 1 % GEL     . diphenoxylate-atropine (LOMOTIL) 2.5-0.025 MG tablet Take 1 tablet by mouth 4 (four) times daily as needed for diarrhea or loose stools.     . docusate sodium (COLACE) 100 MG capsule Take 100 mg by mouth 2 (two) times daily as needed for mild constipation.    Marland Kitchen ezetimibe (ZETIA) 10 MG tablet ONE TABLET BY MOUTH DAILY  90 tablet 2  . ferrous sulfate 325 (65 FE) MG  tablet Take 325 mg by mouth 2 (two) times daily with a meal.     . fluticasone (FLONASE) 50 MCG/ACT nasal spray Place 1 spray into both nostrils daily as needed for allergies or rhinitis.    . folic acid (FOLVITE) 1 MG tablet TAKE 1 TABLET BY MOUTH DAILY  90 tablet 2  . ibuprofen (ADVIL) 800 MG tablet ONE TABLET BY MOUTH THREE TIMES A DAY AS NEEDED  60 tablet 0  . ipratropium-albuterol (DUONEB) 0.5-2.5 (3) MG/3ML SOLN Inhale 3 mLs into the lungs every 6 (six) hours as needed (for shortness of breath).     Marland Kitchen levocetirizine (XYZAL) 5 MG tablet Take 1 tablet by mouth every evening.    Marland Kitchen losartan (COZAAR) 50 MG tablet TAKE 1 TABLET BY MOUTH DAILY  90 tablet 2  . metFORMIN (GLUCOPHAGE) 500 MG tablet Take 500 mg by mouth 2 (two) times daily.    . pantoprazole (PROTONIX) 40 MG tablet ONE TABLET BY MOUTH DAILY  90 tablet 3  . SYMBICORT 160-4.5 MCG/ACT inhaler INHALE 2  PUFFS BY MOUTH INTO THE LUNG 2 TIMES A DAY  30.6 g 2  . verapamil (VERELAN PM) 180 MG 24 hr capsule ONE CAPSULE BY MOUTH TWICE A DAY  180 capsule 1  . Vitamin D, Ergocalciferol, (DRISDOL) 1.25 MG (50000 UNIT) CAPS capsule TAKE 1 CAPSULE BY MOUTH WEEKLY 12 capsule 0   No current facility-administered medications for this visit.    PHYSICAL EXAMINATION: ECOG PERFORMANCE STATUS: 3 - Symptomatic, >50% confined to bed  Vitals:   05/21/20 1503  BP: (!) 149/80  Pulse: 87  Resp: 18  Temp: 98.1 F (36.7 C)  SpO2: 100%   Filed Weights   05/21/20 1503  Weight: 221 lb 4.8 oz (100.4 kg)    Due to COVID19 we will limit examination to appearance. Patient had no complaints.  GENERAL:alert, no distress and comfortable, on [place] oxygen  SKIN: skin color normal, no rashes or significant lesions EYES: normal, Conjunctiva are pink and non-injected, sclera clear  NEURO: alert & oriented x 3 with fluent speech   LABORATORY DATA:  I have reviewed the data as listed CBC Latest Ref Rng & Units 05/21/2020 04/09/2019 09/02/2018  WBC 4.0 - 10.5 K/uL 7.4  6.0 10.9(H)  Hemoglobin 12.0 - 15.0 g/dL 1.6(X) 10.9(L) 10.4(L)  Hematocrit 36 - 46 % 32.5(L) 32.4(L) 34.0(L)  Platelets 150 - 400 K/uL 320 281 400     CMP Latest Ref Rng & Units 05/21/2020 04/09/2019 09/02/2018  Glucose 70 - 99 mg/dL 096(E) 90 454(U)  BUN 8 - 23 mg/dL 19 19 98(J)  Creatinine 0.44 - 1.00 mg/dL 1.91(Y) 7.82(N) 5.62(Z)  Sodium 135 - 145 mmol/L 142 146(H) 137  Potassium 3.5 - 5.1 mmol/L 4.3 4.5 3.4(L)  Chloride 98 - 111 mmol/L 103 102 98  CO2 22 - 32 mmol/L Calcium 8.9 - 10.3 mg/dL 9.7 9.5 3.0(Q)  Total Protein 6.5 - 8.1 g/dL 7.2 - 7.3  Total Bilirubin 0.3 - 1.2 mg/dL <6.5(H) - 0.6  Alkaline Phos 38 - 126 U/L 87 - 90  AST 15 - 41 U/L 12(L) - 31  ALT 0 - 44 U/L 12 - 53(H)      RADIOGRAPHIC STUDIES: I have personally reviewed the radiological images as listed and agreed with the findings in the report. No results found.   ASSESSMENT & PLAN:  Anita Gonzalez is a 74 y.o. female with    1. Normocytic anemia secondary to iron deficiency, from GI AVM bleeding  -She has moderate anemia with baseline hemoglobin in 8-9 range, MCV normal, ferritin 16, serum iron 13, URBC elevated at 430, iron saturation 3%, this is consistent with iron deficient anemia -Anemia has been several years, likely secondary to slow GI bleeding. Her stool OB was positive. Colonoscopy showed AVM, last done in 03/2015. Her 03/15/18 Upper Endoscopy with Dr Elnoria Howard showed no evidence of bleeding.  -Her baseline SPEP and UPEP were negative.  -She also may have a component of anemia of chronic disease secondary to kidney disease and rheumatoid arthritis -She was treated with IV Feraheme 4 times in 2016 and  3 times in 2018. She responded well and was to continue oral iron.  -She lost follow up after 07/2018. She notes in the past year she has had significant fatigue and low energy. I reviewed her labs from today, Hg 9.8. She does not need blood transfusion at this time, but if her Ferritin or iron low, I  will give IV Iron next week.  -I recommend she restart Oral  B12 daily, continue oral Folic acid, iron ferrous sulfate BID with oranges or Vit C -will monitor with labs every 2 months and f/u 6 months.    2. Hypertension, diabetes, rheumatoid arthritis, asthma, CKD stage III -She will continue follow-up with her primary care physician and other specialists -Her CKD is secondary to her HTN and DM, she will continue to mange them.          -She has been on Ibuprofen for Rheumatoid Arthritis         3. Trouble sleeping  -Her increased fatigue is multifactorial including her trouble sleeping. I recommend she increase her Melatonin to 10-15mg .   Plan -Labs reviewed  -Continue oral iron BID, Folic Acid and restart oral B12.  -will set up iv iron if ferritin<100  -Lab in 2 and 4 and 6 months  -F/u in 6 moths     No problem-specific Assessment & Plan notes found for this encounter.   Orders Placed This Encounter  Procedures  . CBC with Differential (Cancer Center Only)    Standing Status:   Standing    Number of Occurrences:   10    Standing Expiration Date:   05/21/2021  . CMP (Cancer Center only)    Standing Status:   Standing    Number of Occurrences:   10    Standing Expiration Date:   05/21/2021  . Ferritin    Standing Status:   Standing    Number of Occurrences:   10    Standing Expiration Date:   05/21/2021  . Iron and TIBC    Standing Status:   Standing    Number of Occurrences:   10    Standing Expiration Date:   05/21/2021   All questions were answered. The patient knows to call the clinic with any problems, questions or concerns. No barriers to learning was detected. The total time spent in the appointment was 25 minutes.     Malachy Mood, MD 05/21/2020   I, Delphina Cahill, am acting as scribe for Malachy Mood, MD.   I have reviewed the above documentation for accuracy and completeness, and I agree with the above.

## 2020-05-21 ENCOUNTER — Encounter: Payer: Self-pay | Admitting: Hematology

## 2020-05-21 ENCOUNTER — Other Ambulatory Visit: Payer: Self-pay

## 2020-05-21 ENCOUNTER — Telehealth: Payer: Self-pay | Admitting: Hematology

## 2020-05-21 ENCOUNTER — Inpatient Hospital Stay: Payer: Medicare HMO | Attending: Hematology | Admitting: Hematology

## 2020-05-21 ENCOUNTER — Inpatient Hospital Stay: Payer: Medicare HMO

## 2020-05-21 VITALS — BP 149/80 | HR 87 | Temp 98.1°F | Resp 18 | Ht 61.0 in | Wt 221.3 lb

## 2020-05-21 DIAGNOSIS — I129 Hypertensive chronic kidney disease with stage 1 through stage 4 chronic kidney disease, or unspecified chronic kidney disease: Secondary | ICD-10-CM | POA: Diagnosis not present

## 2020-05-21 DIAGNOSIS — E1122 Type 2 diabetes mellitus with diabetic chronic kidney disease: Secondary | ICD-10-CM | POA: Diagnosis not present

## 2020-05-21 DIAGNOSIS — D500 Iron deficiency anemia secondary to blood loss (chronic): Secondary | ICD-10-CM

## 2020-05-21 DIAGNOSIS — N183 Chronic kidney disease, stage 3 unspecified: Secondary | ICD-10-CM | POA: Diagnosis not present

## 2020-05-21 DIAGNOSIS — M069 Rheumatoid arthritis, unspecified: Secondary | ICD-10-CM | POA: Diagnosis not present

## 2020-05-21 DIAGNOSIS — Z7984 Long term (current) use of oral hypoglycemic drugs: Secondary | ICD-10-CM | POA: Insufficient documentation

## 2020-05-21 DIAGNOSIS — Z79899 Other long term (current) drug therapy: Secondary | ICD-10-CM | POA: Insufficient documentation

## 2020-05-21 DIAGNOSIS — D649 Anemia, unspecified: Secondary | ICD-10-CM | POA: Diagnosis not present

## 2020-05-21 DIAGNOSIS — D509 Iron deficiency anemia, unspecified: Secondary | ICD-10-CM | POA: Insufficient documentation

## 2020-05-21 LAB — CBC WITH DIFFERENTIAL (CANCER CENTER ONLY)
Abs Immature Granulocytes: 0.02 10*3/uL (ref 0.00–0.07)
Basophils Absolute: 0 10*3/uL (ref 0.0–0.1)
Basophils Relative: 0 %
Eosinophils Absolute: 0.4 10*3/uL (ref 0.0–0.5)
Eosinophils Relative: 5 %
HCT: 32.5 % — ABNORMAL LOW (ref 36.0–46.0)
Hemoglobin: 9.8 g/dL — ABNORMAL LOW (ref 12.0–15.0)
Immature Granulocytes: 0 %
Lymphocytes Relative: 23 %
Lymphs Abs: 1.7 10*3/uL (ref 0.7–4.0)
MCH: 30.2 pg (ref 26.0–34.0)
MCHC: 30.2 g/dL (ref 30.0–36.0)
MCV: 100.3 fL — ABNORMAL HIGH (ref 80.0–100.0)
Monocytes Absolute: 0.5 10*3/uL (ref 0.1–1.0)
Monocytes Relative: 7 %
Neutro Abs: 4.7 10*3/uL (ref 1.7–7.7)
Neutrophils Relative %: 65 %
Platelet Count: 320 10*3/uL (ref 150–400)
RBC: 3.24 MIL/uL — ABNORMAL LOW (ref 3.87–5.11)
RDW: 14.2 % (ref 11.5–15.5)
WBC Count: 7.4 10*3/uL (ref 4.0–10.5)
nRBC: 0 % (ref 0.0–0.2)

## 2020-05-21 LAB — CMP (CANCER CENTER ONLY)
ALT: 12 U/L (ref 0–44)
AST: 12 U/L — ABNORMAL LOW (ref 15–41)
Albumin: 3.7 g/dL (ref 3.5–5.0)
Alkaline Phosphatase: 87 U/L (ref 38–126)
Anion gap: 9 (ref 5–15)
BUN: 19 mg/dL (ref 8–23)
CO2: 30 mmol/L (ref 22–32)
Calcium: 9.7 mg/dL (ref 8.9–10.3)
Chloride: 103 mmol/L (ref 98–111)
Creatinine: 1.5 mg/dL — ABNORMAL HIGH (ref 0.44–1.00)
GFR, Est AFR Am: 40 mL/min — ABNORMAL LOW (ref >60)
GFR, Estimated: 34 mL/min — ABNORMAL LOW (ref >60)
Glucose, Bld: 115 mg/dL — ABNORMAL HIGH (ref 70–99)
Potassium: 4.3 mmol/L (ref 3.5–5.1)
Sodium: 142 mmol/L (ref 135–145)
Total Bilirubin: <0.2 mg/dL — ABNORMAL LOW (ref 0.3–1.2)
Total Protein: 7.2 g/dL (ref 6.5–8.1)

## 2020-05-21 NOTE — Telephone Encounter (Signed)
Scheduled per los. Gave avs and calendar  

## 2020-05-24 ENCOUNTER — Other Ambulatory Visit: Payer: Self-pay | Admitting: Hematology

## 2020-05-24 LAB — FERRITIN: Ferritin: 50 ng/mL (ref 11–307)

## 2020-05-24 LAB — IRON AND TIBC
Iron: 33 ug/dL — ABNORMAL LOW (ref 41–142)
Saturation Ratios: 11 % — ABNORMAL LOW (ref 21–57)
TIBC: 309 ug/dL (ref 236–444)
UIBC: 276 ug/dL (ref 120–384)

## 2020-05-25 ENCOUNTER — Telehealth: Payer: Self-pay

## 2020-05-25 NOTE — Telephone Encounter (Signed)
-----   Message from Malachy Mood, MD sent at 05/24/2020 12:35 PM EDT ----- Please let pt know her iron level was low last week, and schedule iv feraheme once in the next few weeks, thanks   Malachy Mood  05/24/2020

## 2020-05-25 NOTE — Telephone Encounter (Signed)
Called patient and made her aware of Dr. Latanya Maudlin message. Patient verbalized understanding and is aware to expect a call from the scheduling department.

## 2020-05-26 ENCOUNTER — Telehealth: Payer: Self-pay | Admitting: Hematology

## 2020-05-26 NOTE — Telephone Encounter (Signed)
Scheduled appt per 7/13 sch msg - pt is aware of appt date and time   

## 2020-06-03 DIAGNOSIS — M25561 Pain in right knee: Secondary | ICD-10-CM | POA: Diagnosis not present

## 2020-06-03 DIAGNOSIS — M25562 Pain in left knee: Secondary | ICD-10-CM | POA: Diagnosis not present

## 2020-06-03 DIAGNOSIS — M545 Low back pain, unspecified: Secondary | ICD-10-CM | POA: Insufficient documentation

## 2020-06-03 DIAGNOSIS — R6889 Other general symptoms and signs: Secondary | ICD-10-CM | POA: Diagnosis not present

## 2020-06-04 ENCOUNTER — Inpatient Hospital Stay: Payer: Medicare HMO

## 2020-06-04 DIAGNOSIS — M179 Osteoarthritis of knee, unspecified: Secondary | ICD-10-CM | POA: Insufficient documentation

## 2020-06-08 ENCOUNTER — Other Ambulatory Visit: Payer: Self-pay

## 2020-06-08 ENCOUNTER — Inpatient Hospital Stay: Payer: Medicare HMO

## 2020-06-08 VITALS — BP 124/58 | HR 71 | Temp 98.5°F | Resp 18

## 2020-06-08 DIAGNOSIS — D509 Iron deficiency anemia, unspecified: Secondary | ICD-10-CM | POA: Diagnosis not present

## 2020-06-08 DIAGNOSIS — Z79899 Other long term (current) drug therapy: Secondary | ICD-10-CM | POA: Diagnosis not present

## 2020-06-08 DIAGNOSIS — D500 Iron deficiency anemia secondary to blood loss (chronic): Secondary | ICD-10-CM

## 2020-06-08 DIAGNOSIS — N183 Chronic kidney disease, stage 3 unspecified: Secondary | ICD-10-CM | POA: Diagnosis not present

## 2020-06-08 DIAGNOSIS — Z7984 Long term (current) use of oral hypoglycemic drugs: Secondary | ICD-10-CM | POA: Diagnosis not present

## 2020-06-08 DIAGNOSIS — M069 Rheumatoid arthritis, unspecified: Secondary | ICD-10-CM | POA: Diagnosis not present

## 2020-06-08 DIAGNOSIS — I129 Hypertensive chronic kidney disease with stage 1 through stage 4 chronic kidney disease, or unspecified chronic kidney disease: Secondary | ICD-10-CM | POA: Diagnosis not present

## 2020-06-08 DIAGNOSIS — E1122 Type 2 diabetes mellitus with diabetic chronic kidney disease: Secondary | ICD-10-CM | POA: Diagnosis not present

## 2020-06-08 DIAGNOSIS — R6889 Other general symptoms and signs: Secondary | ICD-10-CM | POA: Diagnosis not present

## 2020-06-08 MED ORDER — SODIUM CHLORIDE 0.9 % IV SOLN
510.0000 mg | Freq: Once | INTRAVENOUS | Status: AC
  Administered 2020-06-08: 510 mg via INTRAVENOUS
  Filled 2020-06-08: qty 510

## 2020-06-08 MED ORDER — SODIUM CHLORIDE 0.9 % IV SOLN
INTRAVENOUS | Status: DC
  Filled 2020-06-08: qty 250

## 2020-06-08 NOTE — Patient Instructions (Signed)

## 2020-06-10 ENCOUNTER — Other Ambulatory Visit: Payer: Self-pay | Admitting: Nurse Practitioner

## 2020-06-10 DIAGNOSIS — K7400 Hepatic fibrosis, unspecified: Secondary | ICD-10-CM | POA: Diagnosis not present

## 2020-06-10 DIAGNOSIS — R6889 Other general symptoms and signs: Secondary | ICD-10-CM | POA: Diagnosis not present

## 2020-06-10 DIAGNOSIS — K7469 Other cirrhosis of liver: Secondary | ICD-10-CM

## 2020-06-14 ENCOUNTER — Other Ambulatory Visit: Payer: Medicare HMO

## 2020-06-15 DIAGNOSIS — M069 Rheumatoid arthritis, unspecified: Secondary | ICD-10-CM | POA: Diagnosis not present

## 2020-07-01 DIAGNOSIS — R6889 Other general symptoms and signs: Secondary | ICD-10-CM | POA: Diagnosis not present

## 2020-07-05 ENCOUNTER — Telehealth: Payer: Self-pay | Admitting: Internal Medicine

## 2020-07-05 NOTE — Telephone Encounter (Signed)
New message:    Patient calling stating that some one called her and she is returning the call back. Please call patient.

## 2020-07-07 NOTE — Telephone Encounter (Signed)
I called the patient and let her know that I had not tried to reach her and do not see where anyone else has tried to reach her.  She is scheduled w Dr. Tenny Craw next week.

## 2020-07-09 ENCOUNTER — Other Ambulatory Visit: Payer: Self-pay | Admitting: Internal Medicine

## 2020-07-12 ENCOUNTER — Ambulatory Visit: Payer: Medicare HMO | Admitting: Internal Medicine

## 2020-07-12 DIAGNOSIS — R6889 Other general symptoms and signs: Secondary | ICD-10-CM | POA: Diagnosis not present

## 2020-07-23 ENCOUNTER — Inpatient Hospital Stay: Payer: Medicare HMO | Attending: Hematology

## 2020-07-26 ENCOUNTER — Other Ambulatory Visit: Payer: Self-pay

## 2020-07-26 ENCOUNTER — Other Ambulatory Visit: Payer: Self-pay | Admitting: Internal Medicine

## 2020-07-26 ENCOUNTER — Encounter: Payer: Self-pay | Admitting: Physician Assistant

## 2020-07-26 ENCOUNTER — Ambulatory Visit (INDEPENDENT_AMBULATORY_CARE_PROVIDER_SITE_OTHER): Payer: Medicare HMO | Admitting: Physician Assistant

## 2020-07-26 VITALS — BP 128/76 | HR 72 | Ht 61.0 in | Wt 220.6 lb

## 2020-07-26 DIAGNOSIS — J849 Interstitial pulmonary disease, unspecified: Secondary | ICD-10-CM | POA: Diagnosis not present

## 2020-07-26 DIAGNOSIS — I2510 Atherosclerotic heart disease of native coronary artery without angina pectoris: Secondary | ICD-10-CM | POA: Diagnosis not present

## 2020-07-26 DIAGNOSIS — R0602 Shortness of breath: Secondary | ICD-10-CM | POA: Diagnosis not present

## 2020-07-26 DIAGNOSIS — I498 Other specified cardiac arrhythmias: Secondary | ICD-10-CM | POA: Diagnosis not present

## 2020-07-26 DIAGNOSIS — I10 Essential (primary) hypertension: Secondary | ICD-10-CM

## 2020-07-26 DIAGNOSIS — R6889 Other general symptoms and signs: Secondary | ICD-10-CM | POA: Diagnosis not present

## 2020-07-26 MED ORDER — AMLODIPINE BESYLATE 5 MG PO TABS: 5.0000 mg | ORAL_TABLET | Freq: Two times a day (BID) | ORAL | 3 refills | Status: DC

## 2020-07-26 NOTE — Progress Notes (Signed)
Cardiology Office Note:    Date:  07/26/2020   ID:  Anita Gonzalez, DOB 06-05-1946, MRN [id number]  PCP:  Anita Plum, MD  [facility] Cardiologist:  Anita Pates, MD   [facility] Electrophysiologist:  None   Referring MD: Anita Plum, MD   Chief Complaint:  Follow-up (coronary Ca2+; bradycardia)    Patient Profile:    Anita Gonzalez is a 74 y.o. female with:   Coronary artery disease (noted on chest CT in 7/17)  Myoview in 2018: no ischemia   Aortic atherosclerosis (CT in 7/17)  Diabetes mellitus   Hypertension   Hyperlipidemia   COPD  Junctional bradycardia >> beta-blocker DC'd  A02nemia  Chronic kidney disease   Prior CV studies:   Cardiac monitor 09/2019 Sinus rhythm; heart rate 34-111 (average heart rate 66); occasional PAC, rare PVC; no pauses  Cardiac monitor 12/2018 Normal sinus rhythm, occ short bursts of PAT, accelerated junctional rhythm, no sig arrhythmias.  ABIs 05/24/2018 Normal   Myoview 01/31/2017 EF 66, soft tissue attenuation, no ischemia; low risk  24-hour Holter 08/2016 Sinus rhythm 33 to 121 bpm Average HR 65 bpm Occasional PVCs, PACs Symptoms of slow, fast HR or SOB did not correlate with arrhythmia  Carotid US 05/20/2015 Bilateral ICA 1-39  Echocardiogram 04/13/2015 EF 60-65, normal wall motion, grade 1 diastolic dysfunction, mild LAE   History of Present Illness:    Ms. Anita Gonzalez was last seen in 08/2019 by Dr. Tenny Gonzalez.  She had another monitor and it demonstrated an Avg HR of 66.  However, she did have rates as slow as 34.  It was recommended that she DC her beta-blocker at that time (notes indicate she was still on Metoprolol succinate).    She returns for follow-up.  She is here alone.  She notes chronic shortness of breath with minimal activity.  This is somewhat worse over the past few weeks.  She has not had chest discomfort.  She does have occasional palpitations.  It feels like a skipping sensation.  She  has not had syncope.  She has not had orthopnea or significant lower extremity swelling.  She takes bumetanide if she has lower extremity swelling.      Past Medical History:  Diagnosis Date  . Acute kidney injury (HCC) 01/11/2015  . Alcohol abuse, in remission   . Anemia   . Anemia, iron deficiency 01/28/2015  . Arthritis   . Asthma   . Bronchiectasis (HCC) 03/07/2016  . Bronchitis   . COPD (chronic obstructive pulmonary disease) (HCC)   . Diabetes mellitus without complication (HCC)    type 2  . DM II (diabetes mellitus, type II), controlled (HCC) 01/06/2015  . Dyspnea and respiratory abnormality 04/06/2015  . Dysrhythmia    hx of palpitations  . E-coli UTI 01/11/2015  . Essential hypertension 01/06/2015  . GERD (gastroesophageal reflux disease)   . Hay fever   . Hepatitis    c treated with pills  . HLD (hyperlipidemia) 01/06/2015  . Hypertension   . IBS (irritable bowel syndrome)   . ILD (interstitial lung disease) (HCC) 12/11/2016  . Leg cramps, sleep related 12/01/2015  . Pneumonia   . Rheumatoid arthritis (HCC) 1996  . Sinus tachycardia 01/11/2015   LT monitor 01/2019: normal sinus rhythm, occ short bursts of PAT, accelerated junctional rhythm, no sig arrhythmias.     Current Medications: Current Meds  Medication Sig  . albuterol (PROVENTIL) (2.5 MG/3ML) 0.083% nebulizer solution   . albuterol (VENTOLIN HFA) 108 (90 Base)  MCG/ACT inhaler INHALE 2 PUFFS BY MOUTH EVERY 4-6 HRS AS NEEDED AS DIRECTED   . aspirin EC 81 MG tablet Take 1 tablet (81 mg total) by mouth daily.  Marland Kitchen atorvastatin (LIPITOR) 40 MG tablet ONE TABLET BY MOUTH DAILY   . bumetanide (BUMEX) 2 MG tablet TAKE 1 TABLET BY MOUTH 2 TIMES A WEEK   . cyanocobalamin 1000 MCG tablet Take 1,000 mcg by mouth every Thursday.   . cyclobenzaprine (FLEXERIL) 10 MG tablet Take 10 mg by mouth at bedtime.   . diclofenac sodium (VOLTAREN) 1 % GEL APPLY 2 GRAM TO THE AFFECTED AREAS 4 TIMES A DAY AS NEEDED FOR KNEE PAIN   .  diphenoxylate-atropine (LOMOTIL) 2.5-0.025 MG tablet Take 1 tablet by mouth 4 (four) times daily as needed for diarrhea or loose stools.   . docusate sodium (COLACE) 100 MG capsule Take 100 mg by mouth 2 (two) times daily as needed for mild constipation.  Marland Kitchen ezetimibe (ZETIA) 10 MG tablet ONE TABLET BY MOUTH DAILY   . ferrous sulfate 325 (65 FE) MG tablet Take 325 mg by mouth 2 (two) times daily with a meal.   . fluticasone (FLONASE) 50 MCG/ACT nasal spray Place 1 spray into both nostrils daily as needed for allergies or rhinitis.  . folic acid (FOLVITE) 1 MG tablet TAKE 1 TABLET BY MOUTH DAILY   . ibuprofen (ADVIL) 800 MG tablet ONE TABLET BY MOUTH THREE TIMES A DAY AS NEEDED   . ipratropium-albuterol (DUONEB) 0.5-2.5 (3) MG/3ML SOLN Inhale 3 mLs into the lungs every 6 (six) hours as needed (for shortness of breath).   . loperamide (IMODIUM) 2 MG capsule Take by mouth as needed for diarrhea or loose stools.  Marland Kitchen losartan (COZAAR) 50 MG tablet TAKE 1 TABLET BY MOUTH DAILY   . metFORMIN (GLUCOPHAGE) 500 MG tablet Take 500 mg by mouth 2 (two) times daily.  . montelukast (SINGULAIR) 10 MG tablet Take 10 mg by mouth at bedtime.  . pantoprazole (PROTONIX) 40 MG tablet ONE TABLET BY MOUTH DAILY   . SYMBICORT 160-4.5 MCG/ACT inhaler INHALE 2 PUFFS BY MOUTH INTO THE LUNG 2 TIMES A DAY   . Vitamin D, Ergocalciferol, (DRISDOL) 1.25 MG (50000 UNIT) CAPS capsule TAKE 1 CAPSULE BY MOUTH WEEKLY  . [DISCONTINUED] amLODipine (NORVASC) 2.5 MG tablet Take 2.5 mg by mouth daily.   . [DISCONTINUED] verapamil (VERELAN PM) 180 MG 24 hr capsule ONE CAPSULE BY MOUTH TWICE A DAY      Allergies:   Fish allergy, Peanuts [peanut oil], Penicillins, Iodinated diagnostic agents, Iodine, Percocet [oxycodone-acetaminophen], Hydrocodone, Other, Oxycodone, and Sulfa antibiotics   Social History   Tobacco Use  . Smoking status: Former Smoker    Packs/day: 0.75    Years: 25.00    Pack years: 18.75    Types: Cigarettes    Quit  date: 01/12/2011    Years since quitting: 9.5  . Smokeless tobacco: Never Used  Vaping Use  . Vaping Use: Never used  Substance Use Topics  . Alcohol use: Yes    Alcohol/week: 4.0 standard drinks    Types: 4 Cans of beer per week    Comment: heavy drinker in the past  . Drug use: No     Family Hx: The patient's family history includes Asthma in an other family member; COPD in an other family member; Diabetes in her mother; Heart disease in an other family member; Heart failure in her mother; Hypertension in an other family member; Stroke in an other family  member; Throat cancer in her brother.  ROS   EKGs/Labs/Other Test Reviewed:    EKG:  EKG is   ordered today.  The ekg ordered today demonstrates accelerated junctional rhythm, HR 71, incomplete right bundle branch block, nonspecific ST-T wave changes, and no significant change since last tracing, QTC 452  Recent Labs: 05/21/2020: ALT 12; BUN 19; Creatinine 1.50; Hemoglobin 9.8; Platelet Count 320; Potassium 4.3; Sodium 142   Recent Lipid Panel Lab Results  Component Value Date/Time   CHOL 131 04/09/2019 03:30 PM   TRIG 71 04/09/2019 03:30 PM   HDL 45 04/09/2019 03:30 PM   CHOLHDL 2.9 04/09/2019 03:30 PM   CHOLHDL 3 05/07/2015 12:19 PM   LDLCALC 72 04/09/2019 03:30 PM    Physical Exam:    VS:  BP 128/76   Pulse 72   Ht 5\' 1"  (1.549 m)   Wt 220 lb 9.6 oz (100.1 kg)   SpO2 91%   BMI 41.68 kg/m     Wt Readings from Last 3 Encounters:  07/26/20 220 lb 9.6 oz (100.1 kg)  05/21/20 221 lb 4.8 oz (100.4 kg)  04/09/19 218 lb 9.6 oz (99.2 kg)     Constitutional:      Appearance: Healthy appearance. Not in distress.  Pulmonary:     Effort: Pulmonary effort is normal.     Breath sounds: Wheezing (diffuse exp wheezes) present. Rales (?dry crackles throughout) present.  Cardiovascular:     Normal rate. Regular rhythm. Normal S1. Normal S2.     Murmurs: There is no murmur.  Edema:    Peripheral edema absent.  Abdominal:      Palpations: Abdomen is soft.  Musculoskeletal:     Cervical back: Neck supple. Skin:    General: Skin is warm and dry.  Neurological:     Mental Status: Alert and oriented to person, place and time.     Cranial Nerves: Cranial nerves are intact.       ASSESSMENT & PLAN:    1. Junctional rhythm Her ECG again demonstrates junctional rhythm.  Her heart rates in the 70s.  She has not had syncope.  I reviewed her case today with Dr. Tenny Gonzalez.  She also saw the patient.  We will stop her verapamil.  We will increase her amlodipine to better control her blood pressure.  She will see Dr. Tenny Gonzalez back in a few weeks to recheck her ECG.  2. Coronary artery disease involving native coronary artery of native heart without angina pectoris No anginal symptoms.  Continue atorvastatin, aspirin.  3. Essential hypertension The patient's blood pressure is controlled on her current regimen.  As noted, verapamil will be stopped due to junctional rhythm.  We will increase her amlodipine to 5 mg twice daily to control her blood pressure.  4. ILD (interstitial lung disease) (HCC) 5. Shortness of breath She has questionable rales versus dry crackles on exam.  I cannot appreciate JVD.  Her weights seem to be stable.  She does not have lower extremity swelling.  I will obtain a BMET, BNP as well as a chest x-ray.  If she has edema on her chest x-ray or significant elevated BNP, I will obtain a follow-up echocardiogram.  I have also asked her to contact her pulmonologist to arrange follow-up as it has been several years since she was last seen.  Dispo:  Return in about 3 weeks (around 08/16/2020) for Close Follow Up, w/ Dr. Tenny Gonzalez, in person.   Medication Adjustments/Labs and Tests Ordered: Current medicines  are reviewed at length with the patient today.  Concerns regarding medicines are outlined above.  Tests Ordered: Orders Placed This Encounter  Procedures  . DG Chest 2 View  . Basic metabolic panel  . Pro b  natriuretic peptide (BNP)  . EKG 12-Lead   Medication Changes: Meds ordered this encounter  Medications  . amLODipine (NORVASC) 5 MG tablet    Sig: Take 1 tablet (5 mg total) by mouth in the morning and at bedtime.    Dispense:  180 tablet    Refill:  3    Signed, Tereso Newcomer, PA-C  07/26/2020 1:23 PM    [facility] [address] [place], [place], [place]  [address] Phone: [phone]; Fax: [phone]

## 2020-07-26 NOTE — Patient Instructions (Addendum)
Medication Instructions:  Your physician has recommended you make the following change in your medication:   1) Take Bumex every other day for 2 days, then resume normal dose 2) Increase Amlodipine to 5 mg, 1 tablet by mouth twice a day 3) Stop Verapamil 180 mg  *If you need a refill on your cardiac medications before your next appointment, please call your pharmacy*  Lab Work: You will have labs drawn today: BMET/BNP  Testing/Procedures: Chest X-ray Instructions:    1. You may have this done at the [facility], located in the [facility] Building on the 1st floor.    2. You do no have to have an appointment.    3. [address] [place], [place] [address]        [phone]        Monday - Friday  8:00 am - 5:00 pm   Follow-Up: On 08/13/20 at 11:40AM with Dietrich Pates, MD. Follow up with [facility], there phone number is [phone]

## 2020-07-27 LAB — BASIC METABOLIC PANEL
BUN/Creatinine Ratio: 11 — ABNORMAL LOW (ref 12–28)
BUN: 18 mg/dL (ref 8–27)
CO2: 26 mmol/L (ref 20–29)
Calcium: 9.5 mg/dL (ref 8.7–10.3)
Chloride: 100 mmol/L (ref 96–106)
Creatinine, Ser: 1.62 mg/dL — ABNORMAL HIGH (ref 0.57–1.00)
GFR calc Af Amer: 36 mL/min/{1.73_m2} — ABNORMAL LOW (ref >59)
GFR calc non Af Amer: 31 mL/min/{1.73_m2} — ABNORMAL LOW (ref >59)
Glucose: 125 mg/dL — ABNORMAL HIGH (ref 65–99)
Potassium: 4.4 mmol/L (ref 3.5–5.2)
Sodium: 143 mmol/L (ref 134–144)

## 2020-07-27 LAB — PRO B NATRIURETIC PEPTIDE: NT-Pro BNP: 753 pg/mL — ABNORMAL HIGH (ref 0–301)

## 2020-08-03 DIAGNOSIS — I119 Hypertensive heart disease without heart failure: Secondary | ICD-10-CM | POA: Diagnosis not present

## 2020-08-03 DIAGNOSIS — G4733 Obstructive sleep apnea (adult) (pediatric): Secondary | ICD-10-CM | POA: Diagnosis not present

## 2020-08-03 DIAGNOSIS — E782 Mixed hyperlipidemia: Secondary | ICD-10-CM | POA: Diagnosis not present

## 2020-08-03 DIAGNOSIS — J302 Other seasonal allergic rhinitis: Secondary | ICD-10-CM | POA: Diagnosis not present

## 2020-08-03 DIAGNOSIS — R6889 Other general symptoms and signs: Secondary | ICD-10-CM | POA: Diagnosis not present

## 2020-08-03 DIAGNOSIS — R1013 Epigastric pain: Secondary | ICD-10-CM | POA: Diagnosis not present

## 2020-08-03 DIAGNOSIS — J4540 Moderate persistent asthma, uncomplicated: Secondary | ICD-10-CM | POA: Diagnosis not present

## 2020-08-03 DIAGNOSIS — E1129 Type 2 diabetes mellitus with other diabetic kidney complication: Secondary | ICD-10-CM | POA: Diagnosis not present

## 2020-08-03 DIAGNOSIS — E559 Vitamin D deficiency, unspecified: Secondary | ICD-10-CM | POA: Diagnosis not present

## 2020-08-03 DIAGNOSIS — E611 Iron deficiency: Secondary | ICD-10-CM | POA: Diagnosis not present

## 2020-08-04 DIAGNOSIS — K7402 Hepatic fibrosis, advanced fibrosis: Secondary | ICD-10-CM | POA: Diagnosis not present

## 2020-08-04 DIAGNOSIS — R6889 Other general symptoms and signs: Secondary | ICD-10-CM | POA: Diagnosis not present

## 2020-08-11 DIAGNOSIS — R6889 Other general symptoms and signs: Secondary | ICD-10-CM | POA: Diagnosis not present

## 2020-08-12 ENCOUNTER — Other Ambulatory Visit: Payer: Medicare HMO

## 2020-08-13 ENCOUNTER — Ambulatory Visit: Payer: Medicare HMO | Admitting: Internal Medicine

## 2020-08-23 ENCOUNTER — Ambulatory Visit: Payer: Medicare HMO | Admitting: Physician Assistant

## 2020-08-26 ENCOUNTER — Institutional Professional Consult (permissible substitution): Payer: Medicare HMO | Admitting: Pulmonary Disease

## 2020-08-27 ENCOUNTER — Other Ambulatory Visit: Payer: Self-pay | Admitting: Internal Medicine

## 2020-08-29 ENCOUNTER — Other Ambulatory Visit: Payer: Self-pay | Admitting: Internal Medicine

## 2020-08-30 DIAGNOSIS — Z23 Encounter for immunization: Secondary | ICD-10-CM | POA: Diagnosis not present

## 2020-08-30 DIAGNOSIS — E785 Hyperlipidemia, unspecified: Secondary | ICD-10-CM | POA: Diagnosis not present

## 2020-08-30 DIAGNOSIS — E1122 Type 2 diabetes mellitus with diabetic chronic kidney disease: Secondary | ICD-10-CM | POA: Diagnosis not present

## 2020-08-30 DIAGNOSIS — D631 Anemia in chronic kidney disease: Secondary | ICD-10-CM | POA: Diagnosis not present

## 2020-08-30 DIAGNOSIS — N189 Chronic kidney disease, unspecified: Secondary | ICD-10-CM | POA: Diagnosis not present

## 2020-08-30 DIAGNOSIS — N1832 Chronic kidney disease, stage 3b: Secondary | ICD-10-CM | POA: Diagnosis not present

## 2020-08-30 DIAGNOSIS — I129 Hypertensive chronic kidney disease with stage 1 through stage 4 chronic kidney disease, or unspecified chronic kidney disease: Secondary | ICD-10-CM | POA: Diagnosis not present

## 2020-08-30 DIAGNOSIS — I2510 Atherosclerotic heart disease of native coronary artery without angina pectoris: Secondary | ICD-10-CM | POA: Diagnosis not present

## 2020-08-30 DIAGNOSIS — R6889 Other general symptoms and signs: Secondary | ICD-10-CM | POA: Diagnosis not present

## 2020-08-30 DIAGNOSIS — N140 Analgesic nephropathy: Secondary | ICD-10-CM | POA: Diagnosis not present

## 2020-08-30 DIAGNOSIS — J849 Interstitial pulmonary disease, unspecified: Secondary | ICD-10-CM | POA: Diagnosis not present

## 2020-08-31 ENCOUNTER — Institutional Professional Consult (permissible substitution): Payer: Medicare HMO | Admitting: Pulmonary Disease

## 2020-08-31 NOTE — Telephone Encounter (Signed)
Pt's pharmacy is requesting a refill on ferrous sulfate. Would Dr. Tenny Craw like to refill this medication? Please address

## 2020-09-21 ENCOUNTER — Institutional Professional Consult (permissible substitution): Payer: Medicare HMO | Admitting: Pulmonary Disease

## 2020-09-21 DIAGNOSIS — R6889 Other general symptoms and signs: Secondary | ICD-10-CM | POA: Diagnosis not present

## 2020-09-22 ENCOUNTER — Other Ambulatory Visit: Payer: Self-pay | Admitting: Internal Medicine

## 2020-09-22 DIAGNOSIS — J302 Other seasonal allergic rhinitis: Secondary | ICD-10-CM | POA: Diagnosis not present

## 2020-09-22 DIAGNOSIS — I119 Hypertensive heart disease without heart failure: Secondary | ICD-10-CM | POA: Diagnosis not present

## 2020-09-22 DIAGNOSIS — E1129 Type 2 diabetes mellitus with other diabetic kidney complication: Secondary | ICD-10-CM | POA: Diagnosis not present

## 2020-09-22 DIAGNOSIS — R1013 Epigastric pain: Secondary | ICD-10-CM | POA: Diagnosis not present

## 2020-09-22 DIAGNOSIS — G4733 Obstructive sleep apnea (adult) (pediatric): Secondary | ICD-10-CM | POA: Diagnosis not present

## 2020-09-22 DIAGNOSIS — E559 Vitamin D deficiency, unspecified: Secondary | ICD-10-CM | POA: Diagnosis not present

## 2020-09-22 DIAGNOSIS — G629 Polyneuropathy, unspecified: Secondary | ICD-10-CM | POA: Diagnosis not present

## 2020-09-22 DIAGNOSIS — J4540 Moderate persistent asthma, uncomplicated: Secondary | ICD-10-CM | POA: Diagnosis not present

## 2020-09-22 DIAGNOSIS — E782 Mixed hyperlipidemia: Secondary | ICD-10-CM | POA: Diagnosis not present

## 2020-09-23 DIAGNOSIS — H52209 Unspecified astigmatism, unspecified eye: Secondary | ICD-10-CM | POA: Diagnosis not present

## 2020-09-23 DIAGNOSIS — H524 Presbyopia: Secondary | ICD-10-CM | POA: Diagnosis not present

## 2020-09-23 DIAGNOSIS — H5213 Myopia, bilateral: Secondary | ICD-10-CM | POA: Diagnosis not present

## 2020-09-24 ENCOUNTER — Inpatient Hospital Stay: Payer: Medicare HMO

## 2020-09-24 DIAGNOSIS — R6889 Other general symptoms and signs: Secondary | ICD-10-CM | POA: Diagnosis not present

## 2020-10-01 ENCOUNTER — Ambulatory Visit: Payer: Medicare HMO | Admitting: Internal Medicine

## 2020-10-12 ENCOUNTER — Other Ambulatory Visit: Payer: Self-pay | Admitting: Internal Medicine

## 2020-10-19 ENCOUNTER — Ambulatory Visit (INDEPENDENT_AMBULATORY_CARE_PROVIDER_SITE_OTHER): Payer: Medicare HMO | Admitting: Pulmonary Disease

## 2020-10-19 ENCOUNTER — Other Ambulatory Visit: Payer: Self-pay

## 2020-10-19 ENCOUNTER — Encounter: Payer: Self-pay | Admitting: Pulmonary Disease

## 2020-10-19 VITALS — BP 128/64 | HR 89 | Ht 61.0 in | Wt 222.0 lb

## 2020-10-19 DIAGNOSIS — J479 Bronchiectasis, uncomplicated: Secondary | ICD-10-CM | POA: Diagnosis not present

## 2020-10-19 DIAGNOSIS — J398 Other specified diseases of upper respiratory tract: Secondary | ICD-10-CM | POA: Diagnosis not present

## 2020-10-19 DIAGNOSIS — J4550 Severe persistent asthma, uncomplicated: Secondary | ICD-10-CM | POA: Diagnosis not present

## 2020-10-19 DIAGNOSIS — E66813 Obesity, class 3: Secondary | ICD-10-CM

## 2020-10-19 MED ORDER — TRELEGY ELLIPTA 100-62.5-25 MCG/INH IN AEPB: 1.0000 | INHALATION_SPRAY | Freq: Every day | RESPIRATORY_TRACT | 6 refills | Status: DC

## 2020-10-19 MED ORDER — TRELEGY ELLIPTA 100-62.5-25 MCG/INH IN AEPB: 1.0000 | INHALATION_SPRAY | Freq: Every day | RESPIRATORY_TRACT | 0 refills | Status: DC

## 2020-10-19 NOTE — Patient Instructions (Addendum)
Start trelegy inhaler 1 puff daily - If not covered by insurance please call our office  Stop Symbicort inhaler once starting Trelegy Inhaler  Continue Albuterol nebulizer treatment or inhaler as needed every 4-6 hours  We have placed a referral to the medical weight management clinic

## 2020-10-19 NOTE — Progress Notes (Signed)
Synopsis: Returns to clinic to re-establish care  Subjective:   PATIENT ID: Anita Gonzalez GENDER: female DOB: 09/11/1946, MRN: [id number]   HPI  Chief Complaint  Patient presents with  . Consult    Former pt of Dr. Isaiah Serge last seen 07/17/17.  Pt does have complaints of SOB that can happen at any time but is worse with ambulation, cough with yellow phlegm, wheezing, and also has some chest discomfort.   Anita Gonzalez is a 74 year old woman, former smoker with chronic hypoxemic respiratory failure on 2L O2 with ambulation, asthma and obstructive sleep apnea who returns to pulmonary clinic to re-establish care after last being seen in 2018.    She continues to experience exertional dyspnea and is using 2L while ambulating. She complains of productive cough during the day but it does not disturb her sleep. She does have sinus congestion and pressure which she is using flonase and saline nasal spray. She has been using symbicort and as needed albuterol nebs for her shortness of breath. She does complain of wheezing as well.  PFTs from 2017 show FEV1/FVC 89, FEV1 1.03L (63%), FVC 1.16L (55%), TLC 2.84 (61%), DLCO 68% consistent with a mild restrictive defect and mild diffusion defect.   HR CT Chest in 2017 showed scattered areas of mild cylindrical bronchiectasis and associated thickening of the peribronchovascular interstitium. Inspiratory and expiratory imaging demonstrates air trapping. There is severe collapse of the trachea and mild collapse of the mainstem bronchi on expiratory phase indicative of tracheobronchomalacia.    Past Medical History:  Diagnosis Date  . Acute kidney injury (HCC) 01/11/2015  . Alcohol abuse, in remission   . Anemia   . Anemia, iron deficiency 01/28/2015  . Arthritis   . Asthma   . Bronchiectasis (HCC) 03/07/2016  . Bronchitis   . COPD (chronic obstructive pulmonary disease) (HCC)   . Diabetes mellitus without complication (HCC)    type 2  . DM II (diabetes  mellitus, type II), controlled (HCC) 01/06/2015  . Dyspnea and respiratory abnormality 04/06/2015  . Dysrhythmia    hx of palpitations  . E-coli UTI 01/11/2015  . Essential hypertension 01/06/2015  . GERD (gastroesophageal reflux disease)   . Hay fever   . Hepatitis    c treated with pills  . HLD (hyperlipidemia) 01/06/2015  . Hypertension   . IBS (irritable bowel syndrome)   . ILD (interstitial lung disease) (HCC) 12/11/2016  . Leg cramps, sleep related 12/01/2015  . Pneumonia   . Rheumatoid arthritis (HCC) 1996  . Sinus tachycardia 01/11/2015   LT monitor 01/2019: normal sinus rhythm, occ short bursts of PAT, accelerated junctional rhythm, no sig arrhythmias.      Family History  Problem Relation Age of Onset  . Diabetes Mother   . Heart failure Mother   . Throat cancer Brother        throat cancer   . Asthma Other   . COPD Other   . Hypertension Other   . Stroke Other   . Heart disease Other      Social History   Socioeconomic History  . Marital status: Single    Spouse name: Not on file  . Number of children: Not on file  . Years of education: Not on file  . Highest education level: Not on file  Occupational History  . Occupation: retired  Tobacco Use  . Smoking status: Former Smoker    Packs/day: 0.75    Years: 25.00    Pack years:  18.75    Types: Cigarettes    Quit date: 01/12/2011    Years since quitting: 9.7  . Smokeless tobacco: Never Used  Vaping Use  . Vaping Use: Never used  Substance and Sexual Activity  . Alcohol use: Yes    Alcohol/week: 4.0 standard drinks    Types: 4 Cans of beer per week    Comment: heavy drinker in the past  . Drug use: No  . Sexual activity: Not Currently  Other Topics Concern  . Not on file  Social History Narrative  . Not on file   Social Determinants of Health   Financial Resource Strain:   . Difficulty of Paying Living Expenses: Not on file  Food Insecurity:   . Worried About Programme researcher, broadcasting/film/video in the Last Year:  Not on file  . Ran Out of Food in the Last Year: Not on file  Transportation Needs:   . Lack of Transportation (Medical): Not on file  . Lack of Transportation (Non-Medical): Not on file  Physical Activity:   . Days of Exercise per Week: Not on file  . Minutes of Exercise per Session: Not on file  Stress:   . Feeling of Stress : Not on file  Social Connections:   . Frequency of Communication with Friends and Family: Not on file  . Frequency of Social Gatherings with Friends and Family: Not on file  . Attends Religious Services: Not on file  . Active Member of Clubs or Organizations: Not on file  . Attends Banker Meetings: Not on file  . Marital Status: Not on file  Intimate Partner Violence:   . Fear of Current or Ex-Partner: Not on file  . Emotionally Abused: Not on file  . Physically Abused: Not on file  . Sexually Abused: Not on file     Allergies  Allergen Reactions  . Fish Allergy Anaphylaxis, Hives and Swelling  . Peanuts [Peanut Oil] Anaphylaxis, Hives, Itching and Swelling  . Penicillins Hives, Itching, Swelling and Other (See Comments)    PATIENT HAS HAD A PCN REACTION WITH IMMEDIATE RASH, FACIAL/TONGUE/THROAT SWELLING, SOB, OR LIGHTHEADEDNESS WITH HYPOTENSION:  #  #  YES  #  #  HAS PT DEVELOPED SEVERE RASH INVOLVING MUCUS MEMBRANES or SKIN NECROSIS: #  #  YES  #  # PATIENT HAS HAD A PCN REACTION THAT REQUIRED HOSPITALIZATION:  #  #  YES  #  #  Has patient had a PCN reaction occurring within the last 10 years: No   . Iodinated Diagnostic Agents Hives, Itching and Swelling    SWELLING REACTION UNSPECIFIED   . Iodine Hives, Itching and Swelling    Patient reports receiving IV dye in abdominal CT scan without issue  . Percocet [Oxycodone-Acetaminophen] Nausea Only, Anxiety and Other (See Comments)    Raised blood pressure, Sweats, nervous,dizziness  . Hydrocodone Itching and Nausea Only  . Other Other (See Comments)    UNSPECIFIED REACTION   Hypersensitive to perfumes, colognes, powders, lotions, room sprays  . Oxycodone Itching, Nausea Only and Other (See Comments)    Sweats and dizziness  . Sulfa Antibiotics     UNSPECIFIED REACTION      Outpatient Medications Prior to Visit  Medication Sig Dispense Refill  . albuterol (PROVENTIL) (2.5 MG/3ML) 0.083% nebulizer solution     . albuterol (VENTOLIN HFA) 108 (90 Base) MCG/ACT inhaler INHALE 2 PUFFS BY MOUTH EVERY 4-6 HRS AS NEEDED AS DIRECTED  54 g 5  . amLODipine (NORVASC)  5 MG tablet Take 1 tablet (5 mg total) by mouth in the morning and at bedtime. 180 tablet 3  . aspirin EC 81 MG tablet Take 1 tablet (81 mg total) by mouth daily. 90 tablet 3  . atorvastatin (LIPITOR) 40 MG tablet ONE TABLET BY MOUTH DAILY 90 tablet 2  . bumetanide (BUMEX) 2 MG tablet TAKE 1 TABLET BY MOUTH 2 TIMES A WEEK 24 tablet 3  . cyanocobalamin 1000 MCG tablet Take 1,000 mcg by mouth every Thursday.     . cyclobenzaprine (FLEXERIL) 10 MG tablet Take 10 mg by mouth at bedtime.     . diclofenac sodium (VOLTAREN) 1 % GEL APPLY 2 GRAM TO THE AFFECTED AREAS 4 TIMES A DAY AS NEEDED FOR KNEE PAIN  700 g 3  . diphenoxylate-atropine (LOMOTIL) 2.5-0.025 MG tablet Take 1 tablet by mouth 4 (four) times daily as needed for diarrhea or loose stools.     . docusate sodium (COLACE) 100 MG capsule Take 100 mg by mouth 2 (two) times daily as needed for mild constipation.    Marland Kitchen ezetimibe (ZETIA) 10 MG tablet ONE TABLET BY MOUTH DAILY 90 tablet 3  . ferrous sulfate 325 (65 FE) MG tablet Take 325 mg by mouth 2 (two) times daily with a meal.     . fluticasone (FLONASE) 50 MCG/ACT nasal spray Place 1 spray into both nostrils daily as needed for allergies or rhinitis.    . folic acid (FOLVITE) 1 MG tablet TAKE 1 TABLET BY MOUTH DAILY 90 tablet 3  . ibuprofen (ADVIL) 800 MG tablet ONE TABLET BY MOUTH THREE TIMES A DAY AS NEEDED  60 tablet 0  . losartan (COZAAR) 50 MG tablet TAKE 1 TABLET BY MOUTH DAILY 90 tablet 3  . metFORMIN  (GLUCOPHAGE) 500 MG tablet Take 500 mg by mouth 2 (two) times daily.    . montelukast (SINGULAIR) 10 MG tablet TAKE 1 TABLET BY MOUTH DAILY 90 tablet 3  . pantoprazole (PROTONIX) 40 MG tablet ONE TABLET BY MOUTH DAILY 90 tablet 2  . SYMBICORT 160-4.5 MCG/ACT inhaler INHALE 2 PUFFS BY MOUTH INTO THE LUNG 2 TIMES A DAY  30.6 g 2  . traZODone (DESYREL) 25 mg TABS tablet Take 25 mg by mouth at bedtime.    . Vitamin D, Ergocalciferol, (DRISDOL) 1.25 MG (50000 UNIT) CAPS capsule TAKE 1 CAPSULE BY MOUTH WEEKLY 12 capsule 0  . ipratropium-albuterol (DUONEB) 0.5-2.5 (3) MG/3ML SOLN Inhale 3 mLs into the lungs every 6 (six) hours as needed (for shortness of breath).     . ferrous sulfate 324 (65 Fe) MG TBEC TAKE 1 TABLET BY MOUTH 2 TIMES A DAY 120 tablet 4  . loperamide (IMODIUM) 2 MG capsule Take by mouth as needed for diarrhea or loose stools.     No facility-administered medications prior to visit.    Review of Systems  Constitutional: Negative for chills, fever, malaise/fatigue and weight loss.  HENT: Positive for congestion and sinus pain. Negative for sore throat.   Eyes: Negative.   Respiratory: Positive for cough, sputum production, shortness of breath and wheezing. Negative for hemoptysis.   Cardiovascular: Negative for chest pain, palpitations, orthopnea, claudication, leg swelling and PND.  Gastrointestinal: Negative for abdominal pain, heartburn, nausea and vomiting.  Genitourinary: Negative.   Musculoskeletal: Negative.   Neurological: Negative for dizziness, weakness and headaches.  Psychiatric/Behavioral: Negative.     Objective:   Vitals:   10/19/20 1439  BP: 128/64  Pulse: 89  SpO2: 96%  Weight: 222  lb (100.7 kg)  Height: 5\' 1"  (1.549 m)    Physical Exam Constitutional:      General: She is not in acute distress.    Appearance: She is obese.  HENT:     Head: Normocephalic and atraumatic.     Nose: Nose normal.     Mouth/Throat:     Mouth: Mucous membranes are  moist.     Pharynx: Oropharynx is clear.  Eyes:     General: No scleral icterus.    Conjunctiva/sclera: Conjunctivae normal.     Pupils: Pupils are equal, round, and reactive to light.  Cardiovascular:     Rate and Rhythm: Normal rate and regular rhythm.     Pulses: Normal pulses.     Heart sounds: Normal heart sounds. No murmur heard.   Pulmonary:     Effort: Pulmonary effort is normal.     Breath sounds: Wheezing and rhonchi present. No rales.  Abdominal:     General: Bowel sounds are normal.     Palpations: Abdomen is soft.  Musculoskeletal:     Cervical back: Neck supple.     Right lower leg: No edema.     Left lower leg: No edema.  Lymphadenopathy:     Cervical: No cervical adenopathy.  Skin:    General: Skin is warm and dry.  Neurological:     General: No focal deficit present.     Mental Status: She is alert.  Psychiatric:        Mood and Affect: Mood normal.        Behavior: Behavior normal.        Thought Content: Thought content normal.        Judgment: Judgment normal.     CBC    Component Value Date/Time   WBC 7.4 05/21/2020 1444   WBC 10.9 (H) 09/02/2018 0414   RBC 3.24 (L) 05/21/2020 1444   HGB 9.8 (L) 05/21/2020 1444   HGB 10.9 (L) 04/09/2019 1530   HGB 11.9 07/24/2017 1334   HCT 32.5 (L) 05/21/2020 1444   HCT 32.4 (L) 04/09/2019 1530   HCT 37.1 07/24/2017 1334   PLT 320 05/21/2020 1444   PLT 281 04/09/2019 1530   MCV 100.3 (H) 05/21/2020 1444   MCV 93 04/09/2019 1530   MCV 96.7 07/24/2017 1334   MCH 30.2 05/21/2020 1444   MCHC 30.2 05/21/2020 1444   RDW 14.2 05/21/2020 1444   RDW 13.3 04/09/2019 1530   RDW 14.6 (H) 07/24/2017 1334   LYMPHSABS 1.7 05/21/2020 1444   LYMPHSABS 1.8 07/24/2017 1334   MONOABS 0.5 05/21/2020 1444   MONOABS 0.4 07/24/2017 1334   EOSABS 0.4 05/21/2020 1444   EOSABS 0.2 07/24/2017 1334   BASOSABS 0.0 05/21/2020 1444   BASOSABS 0.0 07/24/2017 1334   BMP Latest Ref Rng & Units 07/26/2020 05/21/2020 04/09/2019   Glucose 65 - 99 mg/dL 04/11/2019) 678(L) 90  BUN 8 - 27 mg/dL 18 19 19   Creatinine 0.57 - 1.00 mg/dL 381(O) ) 1.75(Z)  BUN/Creat Ratio 12 - 28 11(L) - 16  Sodium 134 - 144 mmol/L 143 142 146(H)  Potassium 3.5 - 5.2 mmol/L 4.4 4.3 4.5  Chloride 96 - 106 mmol/L 100 103 102  CO2 20 - 29 mmol/L 26 30 27   Calcium 8.7 - 10.3 mg/dL 9.5 9.7 9.5   Chest imaging: CXR 09/02/2018 There is shallow lung inflation. The cardiomediastinal contours are normal.  There is no focal airspace consolidation or pulmonary edema. There is no pleural effusion or  pneumothorax.  HRCT Chest 06/08/2016 1. Again noted is mild diffuse cylindrical bronchiectasis and thickening of the peribronchovascular interstitium. The subtle areas of ground-glass attenuation and septal thickening in the lungs persist compared to the prior study, but appear unchanged, and may indicate areas of nonspecific interstitial pneumonia (NSIP). 2. Today's study also demonstrates mild air trapping, indicative of small airways disease, and demonstrates evidence of tracheobronchomalacia. In retrospect, the prior expiratory phase images were unlikely to be truly expiratory on study 04/13/2015. 3. Aortic atherosclerosis, in addition to three-vessel coronary artery disease. Please note that although the presence of coronary artery calcium documents the presence of coronary artery disease, the severity of this disease and any potential stenosis cannot be assessed on this non-gated CT examination. Assessment for potential risk factor modification, dietary therapy or pharmacologic therapy may be warranted, if clinically indicated. 4. Cardiomegaly with mild left atrial dilatation.  V/Q Scan 05/30/2015 No ventilation or perfusion defects identified. Very low probability of pulmonary embolus.  PFT: PFT Results Latest Ref Rng & Units 05/30/2016 04/16/2015  FVC-Pre L 1.16 1.18  FVC-Predicted Pre % 55 55  FVC-Post L 1.16 1.37  FVC-Predicted  Post % 55 64  Pre FEV1/FVC % % 88 88  Post FEV1/FCV % % 89 88  FEV1-Pre L 1.02 1.04  FEV1-Predicted Pre % 63 62  FEV1-Post L 1.03 1.20  DLCO uncorrected ml/min/mmHg 13.80 7.13  DLCO UNC% % 68 35  DLCO corrected ml/min/mmHg 14.85 7.91  DLCO COR %Predicted % 73 39  DLVA Predicted % 147 85  TLC L 2.84 2.72  TLC % Predicted % 61 59  RV % Predicted % 74 71   Echo: 04/13/2015 - Left ventricle: The cavity size was normal. Wall thickness was  normal. Systolic function was normal. The estimated ejection  fraction was in the range of 60% to 65%. Wall motion was normal;  there were no regional wall motion abnormalities. Doppler  parameters are consistent with abnormal left ventricular  relaxation (grade 1 diastolic dysfunction).  - Left atrium: The atrium was mildly dilated.   Stress Test 01/31/2017 1. EF 66%, normal wall motion.  2. Fixed, small, mild mid anterior perfusion defect.  Given normal wall motion, suspect soft tissue attenuation rather than infarction.  No ischemia.  3. Low risk study.   Assessment & Plan:   Tracheobronchomalacia  Bronchiectasis without complication (HCC)  Severe persistent asthma without complication  Class 3 severe obesity with serious comorbidity in adult, unspecified BMI, unspecified obesity type (HCC)  Discussion: Cintya Daughety is a 74 year old woman, former smoker with chronic hypoxemic respiratory failure on 2L O2 with ambulation, asthma and obstructive sleep apnea who returns to pulmonary clinic to re-establish care after last being seen in 2018.   On HR CT Chest in 2017 she has evidence of significant tracheobronchomalacia of the trachea and mildly so of the mainstem bronchi. There is also evidence of scattered cylindrical bronchiectasis and air trapping concerning for small airways disease.   Her asthma does not appear to be well controlled on symbicort so therefore we will provide her with trelegy samples and send in a prescription for  her to continue on. Her on going wheezing may be related to tracheobronchomalacia. Her absolute eosinophil count was 400/uL in 07/26/20. Depending on how she benefits from the trelegy inhaler we can check eosinophil and IgE level in the future.   Her cough is related to her sinus congestion and drainage as well as her bronchiectasis. We discussed bronchial hygiene with using nebulizer  treatments twice daily followed by flutter valve use which we have provided her today.   Given the history of obstructive sleep apnea and obesity we will refer her to the weight management clinic. Will discuss at the follow up visit if she is following with a sleep specialist.    Follow up in 3 months  >60 minutes was spent on this visit which included direct patient care, chart review and documentation.   Melody Comas, MD [facility] Office: [phone]   See Amion for Pager Details    Current Outpatient Medications:  .  albuterol (PROVENTIL) (2.5 MG/3ML) 0.083% nebulizer solution, , Disp: , Rfl:  .  albuterol (VENTOLIN HFA) 108 (90 Base) MCG/ACT inhaler, INHALE 2 PUFFS BY MOUTH EVERY 4-6 HRS AS NEEDED AS DIRECTED , Disp: 54 g, Rfl: 5 .  amLODipine (NORVASC) 5 MG tablet, Take 1 tablet (5 mg total) by mouth in the morning and at bedtime., Disp: 180 tablet, Rfl: 3 .  aspirin EC 81 MG tablet, Take 1 tablet (81 mg total) by mouth daily., Disp: 90 tablet, Rfl: 3 .  atorvastatin (LIPITOR) 40 MG tablet, ONE TABLET BY MOUTH DAILY, Disp: 90 tablet, Rfl: 2 .  bumetanide (BUMEX) 2 MG tablet, TAKE 1 TABLET BY MOUTH 2 TIMES A WEEK, Disp: 24 tablet, Rfl: 3 .  cyanocobalamin 1000 MCG tablet, Take 1,000 mcg by mouth every Thursday. , Disp: , Rfl:  .  cyclobenzaprine (FLEXERIL) 10 MG tablet, Take 10 mg by mouth at bedtime. , Disp: , Rfl:  .  diclofenac sodium (VOLTAREN) 1 % GEL, APPLY 2 GRAM TO THE AFFECTED AREAS 4 TIMES A DAY AS NEEDED FOR KNEE PAIN , Disp: 700 g, Rfl: 3 .  diphenoxylate-atropine  (LOMOTIL) 2.5-0.025 MG tablet, Take 1 tablet by mouth 4 (four) times daily as needed for diarrhea or loose stools. , Disp: , Rfl:  .  docusate sodium (COLACE) 100 MG capsule, Take 100 mg by mouth 2 (two) times daily as needed for mild constipation., Disp: , Rfl:  .  ezetimibe (ZETIA) 10 MG tablet, ONE TABLET BY MOUTH DAILY, Disp: 90 tablet, Rfl: 3 .  ferrous sulfate 325 (65 FE) MG tablet, Take 325 mg by mouth 2 (two) times daily with a meal. , Disp: , Rfl:  .  fluticasone (FLONASE) 50 MCG/ACT nasal spray, Place 1 spray into both nostrils daily as needed for allergies or rhinitis., Disp: , Rfl:  .  folic acid (FOLVITE) 1 MG tablet, TAKE 1 TABLET BY MOUTH DAILY, Disp: 90 tablet, Rfl: 3 .  ibuprofen (ADVIL) 800 MG tablet, ONE TABLET BY MOUTH THREE TIMES A DAY AS NEEDED , Disp: 60 tablet, Rfl: 0 .  losartan (COZAAR) 50 MG tablet, TAKE 1 TABLET BY MOUTH DAILY, Disp: 90 tablet, Rfl: 3 .  metFORMIN (GLUCOPHAGE) 500 MG tablet, Take 500 mg by mouth 2 (two) times daily., Disp: , Rfl:  .  montelukast (SINGULAIR) 10 MG tablet, TAKE 1 TABLET BY MOUTH DAILY, Disp: 90 tablet, Rfl: 3 .  pantoprazole (PROTONIX) 40 MG tablet, ONE TABLET BY MOUTH DAILY, Disp: 90 tablet, Rfl: 2 .  SYMBICORT 160-4.5 MCG/ACT inhaler, INHALE 2 PUFFS BY MOUTH INTO THE LUNG 2 TIMES A DAY , Disp: 30.6 g, Rfl: 2 .  traZODone (DESYREL) 25 mg TABS tablet, Take 25 mg by mouth at bedtime., Disp: , Rfl:  .  Vitamin D, Ergocalciferol, (DRISDOL) 1.25 MG (50000 UNIT) CAPS capsule, TAKE 1 CAPSULE BY MOUTH WEEKLY, Disp: 12 capsule, Rfl: 0

## 2020-11-22 ENCOUNTER — Telehealth: Payer: Self-pay | Admitting: Hematology

## 2020-11-22 NOTE — Telephone Encounter (Signed)
Rescheduled appts per 1/10 email. Pt confirmed new appt date/time.  

## 2020-11-23 DIAGNOSIS — E559 Vitamin D deficiency, unspecified: Secondary | ICD-10-CM | POA: Diagnosis not present

## 2020-11-23 DIAGNOSIS — E782 Mixed hyperlipidemia: Secondary | ICD-10-CM | POA: Diagnosis not present

## 2020-11-23 DIAGNOSIS — E1142 Type 2 diabetes mellitus with diabetic polyneuropathy: Secondary | ICD-10-CM | POA: Diagnosis not present

## 2020-11-23 DIAGNOSIS — L03011 Cellulitis of right finger: Secondary | ICD-10-CM | POA: Diagnosis not present

## 2020-11-23 DIAGNOSIS — J4540 Moderate persistent asthma, uncomplicated: Secondary | ICD-10-CM | POA: Diagnosis not present

## 2020-11-23 DIAGNOSIS — I119 Hypertensive heart disease without heart failure: Secondary | ICD-10-CM | POA: Diagnosis not present

## 2020-11-23 DIAGNOSIS — E611 Iron deficiency: Secondary | ICD-10-CM | POA: Diagnosis not present

## 2020-11-23 DIAGNOSIS — E1129 Type 2 diabetes mellitus with other diabetic kidney complication: Secondary | ICD-10-CM | POA: Diagnosis not present

## 2020-11-23 DIAGNOSIS — R6889 Other general symptoms and signs: Secondary | ICD-10-CM | POA: Diagnosis not present

## 2020-11-25 ENCOUNTER — Other Ambulatory Visit: Payer: Self-pay | Admitting: Internal Medicine

## 2020-11-25 DIAGNOSIS — R6889 Other general symptoms and signs: Secondary | ICD-10-CM | POA: Diagnosis not present

## 2020-11-25 NOTE — Telephone Encounter (Signed)
Pt's pharmacy is requesting a refill on albuterol and cyclobenzaprine. Would Dr. Tenny Craw like to refill these medications? Please address

## 2020-11-26 ENCOUNTER — Other Ambulatory Visit: Payer: Medicare HMO

## 2020-11-26 ENCOUNTER — Ambulatory Visit: Payer: Medicare HMO | Admitting: Hematology

## 2020-11-26 DIAGNOSIS — R6889 Other general symptoms and signs: Secondary | ICD-10-CM | POA: Diagnosis not present

## 2020-11-26 NOTE — Telephone Encounter (Signed)
These should be refilled by patient's PCP.

## 2020-12-06 ENCOUNTER — Ambulatory Visit: Payer: Medicare HMO | Admitting: Internal Medicine

## 2020-12-08 NOTE — Progress Notes (Incomplete)
[facility]   Telephone:([phone] Fax:([phone]   Clinic Follow up Note   Patient Care Team: [name], MD as PCP - General (Internal Medicine) Pricilla Riffle, MD as PCP - Cardiology (Cardiology) Jearld Lesch, MD as Referring Physician (Specialist) Jearld Lesch, MD as Referring Physician (Specialist) Jeani Hawking, MD as Consulting Physician (Gastroenterology) Clyde Lundborg., MD as Referring Physician (Anesthesiology) Larey Dresser, DPM as Consulting Physician (Podiatry)  Date of Service:  12/08/2020  CHIEF COMPLAINT: Follow-up iron deficient anemia  CURRENT THERAPY:  Oral iron Ferous sulfate BID, Folic Acid, oral B12 daily. IV Feraheme as needed, last in 07/2017.   INTERVAL HISTORY: *** Anita Gonzalez is here for a follow up of IDA. She was last seen by me 6 months. She presents to the clinic alone.    REVIEW OF SYSTEMS:  *** Constitutional: Denies fevers, chills or abnormal weight loss Eyes: Denies blurriness of vision Ears, nose, mouth, throat, and face: Denies mucositis or sore throat Respiratory: Denies cough, dyspnea or wheezes Cardiovascular: Denies palpitation, chest discomfort or lower extremity swelling Gastrointestinal:  Denies nausea, heartburn or change in bowel habits Skin: Denies abnormal skin rashes Lymphatics: Denies new lymphadenopathy or easy bruising Neurological:Denies numbness, tingling or new weaknesses Behavioral/Psych: Mood is stable, no new changes  All other systems were reviewed with the patient and are negative.  MEDICAL HISTORY:  Past Medical History:  Diagnosis Date  . Acute kidney injury (HCC) 01/11/2015  . Alcohol abuse, in remission   . Anemia   . Anemia, iron deficiency 01/28/2015  . Arthritis   . Asthma   . Bronchiectasis (HCC) 03/07/2016  . Bronchitis   . COPD (chronic obstructive pulmonary disease) (HCC)   . Diabetes mellitus without complication (HCC)    type 2  .  DM II (diabetes mellitus, type II), controlled (HCC) 01/06/2015  . Dyspnea and respiratory abnormality 04/06/2015  . Dysrhythmia    hx of palpitations  . E-coli UTI 01/11/2015  . Essential hypertension 01/06/2015  . GERD (gastroesophageal reflux disease)   . Hay fever   . Hepatitis    c treated with pills  . HLD (hyperlipidemia) 01/06/2015  . Hypertension   . IBS (irritable bowel syndrome)   . ILD (interstitial lung disease) (HCC) 12/11/2016  . Leg cramps, sleep related 12/01/2015  . Pneumonia   . Rheumatoid arthritis (HCC) 1996  . Sinus tachycardia 01/11/2015   LT monitor 01/2019: normal sinus rhythm, occ short bursts of PAT, accelerated junctional rhythm, no sig arrhythmias.     SURGICAL HISTORY: Past Surgical History:  Procedure Laterality Date  . ABDOMINAL HYSTERECTOMY    . BALLOON DILATION N/A 03/15/2018   Procedure: BALLOON DILATION;  Surgeon: Jeani Hawking, MD;  Location: [facility];  Service: Endoscopy;  Laterality: N/A;  . CARPAL TUNNEL RELEASE Left 04/19/2018   Procedure: CARPAL TUNNEL RELEASE;  Surgeon: Coletta Memos, MD;  Location: [facility];  Service: Neurosurgery;  Laterality: Left;  CARPAL TUNNEL RELEASE  . CESAREAN SECTION    . COLONOSCOPY WITH PROPOFOL N/A 04/02/2015   Procedure: COLONOSCOPY WITH PROPOFOL;  Surgeon: Jeani Hawking, MD;  Location: [facility];  Service: Endoscopy;  Laterality: N/A;  . ESOPHAGOGASTRODUODENOSCOPY (EGD) WITH PROPOFOL N/A 04/02/2015   Procedure: ESOPHAGOGASTRODUODENOSCOPY (EGD) WITH PROPOFOL;  Surgeon: Jeani Hawking, MD;  Location: [facility];  Service: Endoscopy;  Laterality: N/A;  . ESOPHAGOGASTRODUODENOSCOPY (EGD) WITH PROPOFOL N/A 03/15/2018   Procedure: ESOPHAGOGASTRODUODENOSCOPY (EGD) WITH PROPOFOL;  Surgeon: Jeani Hawking, MD;  Location: [facility];  Service: Endoscopy;  Laterality: N/A;  . HEMORRHOID SURGERY    . HERNIA REPAIR    . HERNIA REPAIR      I have reviewed the social history and family history with the patient and they are  unchanged from previous note.  ALLERGIES:  is allergic to fish allergy, peanuts [peanut oil], penicillins, iodinated diagnostic agents, iodine, percocet [oxycodone-acetaminophen], hydrocodone, other, oxycodone, and sulfa antibiotics.  MEDICATIONS:  Current Outpatient Medications  Medication Sig Dispense Refill  . albuterol (PROVENTIL) (2.5 MG/3ML) 0.083% nebulizer solution     . albuterol (VENTOLIN HFA) 108 (90 Base) MCG/ACT inhaler INHALE 2 PUFFS BY MOUTH EVERY 4-6 HRS AS NEEDED AS DIRECTED  54 g 5  . amLODipine (NORVASC) 5 MG tablet Take 1 tablet (5 mg total) by mouth in the morning and at bedtime. 180 tablet 3  . aspirin EC 81 MG tablet Take 1 tablet (81 mg total) by mouth daily. 90 tablet 3  . atorvastatin (LIPITOR) 40 MG tablet ONE TABLET BY MOUTH DAILY 90 tablet 2  . bumetanide (BUMEX) 2 MG tablet TAKE 1 TABLET BY MOUTH 2 TIMES A WEEK 24 tablet 3  . cyanocobalamin 1000 MCG tablet Take 1,000 mcg by mouth every Thursday.     . cyclobenzaprine (FLEXERIL) 10 MG tablet Take 10 mg by mouth at bedtime.     . diclofenac sodium (VOLTAREN) 1 % GEL APPLY 2 GRAM TO THE AFFECTED AREAS 4 TIMES A DAY AS NEEDED FOR KNEE PAIN  700 g 3  . diphenoxylate-atropine (LOMOTIL) 2.5-0.025 MG tablet Take 1 tablet by mouth 4 (four) times daily as needed for diarrhea or loose stools.     . docusate sodium (COLACE) 100 MG capsule Take 100 mg by mouth 2 (two) times daily as needed for mild constipation.    Marland Kitchen ezetimibe (ZETIA) 10 MG tablet ONE TABLET BY MOUTH DAILY 90 tablet 3  . ferrous sulfate 325 (65 FE) MG tablet Take 325 mg by mouth 2 (two) times daily with a meal.     . fluticasone (FLONASE) 50 MCG/ACT nasal spray Place 1 spray into both nostrils daily as needed for allergies or rhinitis.    . Fluticasone-Umeclidin-Vilant (TRELEGY ELLIPTA) 100-62.5-25 MCG/INH AEPB Inhale 1 puff into the lungs daily. 60 each 6  . Fluticasone-Umeclidin-Vilant (TRELEGY ELLIPTA) 100-62.5-25 MCG/INH AEPB Inhale 1 puff into the lungs  daily. 14 each 0  . folic acid (FOLVITE) 1 MG tablet TAKE 1 TABLET BY MOUTH DAILY 90 tablet 3  . ibuprofen (ADVIL) 800 MG tablet ONE TABLET BY MOUTH THREE TIMES A DAY AS NEEDED  60 tablet 0  . losartan (COZAAR) 50 MG tablet TAKE 1 TABLET BY MOUTH DAILY 90 tablet 3  . metFORMIN (GLUCOPHAGE) 500 MG tablet Take 500 mg by mouth 2 (two) times daily.    . montelukast (SINGULAIR) 10 MG tablet TAKE 1 TABLET BY MOUTH DAILY 90 tablet 3  . pantoprazole (PROTONIX) 40 MG tablet ONE TABLET BY MOUTH DAILY 90 tablet 2  . traZODone (DESYREL) 25 mg TABS tablet Take 25 mg by mouth at bedtime.    . Vitamin D, Ergocalciferol, (DRISDOL) 1.25 MG (50000 UNIT) CAPS capsule TAKE 1 CAPSULE BY MOUTH WEEKLY 12 capsule 0   No current facility-administered medications for this visit.    PHYSICAL EXAMINATION: ECOG PERFORMANCE STATUS: {CHL ONC ECOG PS:[phone]}  There were no vitals filed for this visit. There were no vitals filed for this visit. *** GENERAL:alert, no distress and comfortable SKIN: skin color, texture, turgor are normal, no rashes  or significant lesions EYES: normal, Conjunctiva are pink and non-injected, sclera clear {OROPHARYNX:no exudate, no erythema and lips, buccal mucosa, and tongue normal}  NECK: supple, thyroid normal size, non-tender, without nodularity LYMPH:  no palpable lymphadenopathy in the cervical, axillary {or inguinal} LUNGS: clear to auscultation and percussion with normal breathing effort HEART: regular rate & rhythm and no murmurs and no lower extremity edema ABDOMEN:abdomen soft, non-tender and normal bowel sounds Musculoskeletal:no cyanosis of digits and no clubbing  NEURO: alert & oriented x 3 with fluent speech, no focal motor/sensory deficits  LABORATORY DATA:  I have reviewed the data as listed CBC Latest Ref Rng & Units 05/21/2020 04/09/2019 09/02/2018  WBC 4.0 - 10.5 K/uL 7.4 6.0 10.9(H)  Hemoglobin 12.0 - 15.0 g/dL 6.3(W) 10.9(L) 10.4(L)  Hematocrit 36.0 - 46.0 %  32.5(L) 32.4(L) 34.0(L)  Platelets 150 - 400 K/uL 320 281 400     CMP Latest Ref Rng & Units 07/26/2020 05/21/2020 04/09/2019  Glucose 65 - 99 mg/dL 466(Z) 993(T) 90  BUN 8 - 27 mg/dL 18 19 19   Creatinine 0.57 - 1.00 mg/dL ) 7.01(X) 7.93(J)  Sodium 134 - 144 mmol/L 143 142 146(H)  Potassium 3.5 - 5.2 mmol/L 4.4 4.3 4.5  Chloride 96 - 106 mmol/L 100 103 102  CO2 20 - 29 mmol/L 26 30 27   Calcium 8.7 - 10.3 mg/dL 9.5 9.7 9.5  Total Protein 6.5 - 8.1 g/dL - 7.2 -  Total Bilirubin 0.3 - 1.2 mg/dL - 0.30(S) -  Alkaline Phos 38 - 126 U/L - 87 -  AST 15 - 41 U/L - 12(L) -  ALT 0 - 44 U/L - 12 -      RADIOGRAPHIC STUDIES: I have personally reviewed the radiological images as listed and agreed with the findings in the report. No results found.   ASSESSMENT & PLAN:  Anita Gonzalez is a 75 y.o. female with   1. Normocytic anemia secondary to iron deficiency, from GI AVM bleeding  -She has moderate anemia with baseline hemoglobin in 8-9 range, MCV normal, ferritin 16, serum iron 13, URBC elevated at 430, iron saturation 3%, this is consistent with iron deficient anemia -Anemia has been several years, likely secondary to slow GI bleeding. Her stool OB was positive. Colonoscopy showed AVM, last done in 03/2015. Her 03/15/18 Upper Endoscopy with Dr 04/2015 showed no evidence of bleeding.  -Her baseline SPEP and UPEP were negative.  -She also may have a component of anemia of chronic disease secondary to kidney disease and rheumatoid arthritis -She was treated with IV Feraheme 4 times in 2016 and  3 times in 2018. She responded well and was to continue oral iron.  -She lost follow up after 07/2018. She notes in the past year she has had significant fatigue and low energy. I reviewed her labs from today, Hg 9.8. She does not need blood transfusion at this time, but if her Ferritin or iron low, I will give IV Iron next week.  -I recommend she restart Oral B12 2019 daily, continue oral Folic acid, iron  ferrous sulfate BID with oranges or Vit C -will monitor with labs every 2 months and f/u 6 months.    2. Hypertension, diabetes, rheumatoid arthritis, asthma, CKD stage III -She will continue follow-up with her primary care physician and other specialists -Her CKD is secondary to her HTN and DM, she will continue to mange them.          -She has been on Ibuprofen for Rheumatoid Arthritis  3. Trouble sleeping  -Her increased fatigue is multifactorial including her trouble sleeping. I recommend she increase her Melatonin to 10-15mg .   Plan -Labs reviewed  -Continue oral iron BID, Folic Acid and restart oral B12.  -will set up iv iron if ferritin<100  -Lab in 2 and 4 and 6 months  -F/u in 6 moths    No problem-specific Assessment & Plan notes found for this encounter.   No orders of the defined types were placed in this encounter.  All questions were answered. The patient knows to call the clinic with any problems, questions or concerns. No barriers to learning was detected. The total time spent in the appointment was {CHL ONC TIME VISIT - [id number]}.     Delphina Cahill 12/08/2020   Rogelia Rohrer, am acting as scribe for Malachy Mood, MD.   {Add scribe attestation statement}

## 2020-12-10 ENCOUNTER — Inpatient Hospital Stay: Payer: Medicare HMO | Admitting: Physician Assistant

## 2020-12-10 ENCOUNTER — Telehealth: Payer: Self-pay | Admitting: Physician Assistant

## 2020-12-10 ENCOUNTER — Inpatient Hospital Stay: Payer: Medicare HMO

## 2020-12-10 DIAGNOSIS — R6889 Other general symptoms and signs: Secondary | ICD-10-CM | POA: Diagnosis not present

## 2020-12-10 NOTE — Telephone Encounter (Signed)
The patient was scheduled for a return visit to follow up on her IDA. She did not show up for her appointment today. I called the patient and she states she had sprained her ankle and will not be able to make it to the clinic today. She states she forgot to call the cancel. She states she needs at least two weeks to heal before coming back to the clinic. I sent a scheduling message to make a return visit in about 2-4 weeks.

## 2020-12-13 ENCOUNTER — Telehealth: Payer: Self-pay | Admitting: Hematology

## 2020-12-13 NOTE — Telephone Encounter (Signed)
Attempted to call to reschedule past-appointment per 1/28 schedule message. Unable to leave a message.

## 2020-12-22 NOTE — Progress Notes (Incomplete)
Anita Gonzalez   [phone] Fax:(336) [phone]   Clinic Follow up Note   Patient Care Team: [name], MD as PCP - General (Internal Medicine) Pricilla Riffle, MD as PCP - Cardiology (Cardiology) Jearld Lesch, MD as Referring Physician (Specialist) Jearld Lesch, MD as Referring Physician (Specialist) Jeani Hawking, MD as Consulting Physician (Gastroenterology) Clyde Lundborg., MD as Referring Physician (Anesthesiology) Larey Dresser, DPM as Consulting Physician (Podiatry)  Date of Service:  12/22/2020  CHIEF COMPLAINT: Follow-up iron deficient anemia  CURRENT THERAPY:  Oral iron Ferous sulfate BID, Folic Acid, oral B12 daily. IV Feraheme as needed, last in 07/2017.   INTERVAL HISTORY: *** Anita Gonzalez is here for a follow up of IDA. She was last seen by me 7 months ago. She presents to the clinic alone.    REVIEW OF SYSTEMS:  *** Constitutional: Denies fevers, chills or abnormal weight loss Eyes: Denies blurriness of vision Ears, nose, mouth, throat, and face: Denies mucositis or sore throat Respiratory: Denies cough, dyspnea or wheezes Cardiovascular: Denies palpitation, chest discomfort or lower extremity swelling Gastrointestinal:  Denies nausea, heartburn or change in bowel habits Skin: Denies abnormal skin rashes Lymphatics: Denies new lymphadenopathy or easy bruising Neurological:Denies numbness, tingling or new weaknesses Behavioral/Psych: Mood is stable, no new changes  All other systems were reviewed with the patient and are negative.  MEDICAL HISTORY:  Past Medical History:  Diagnosis Date  . Acute kidney injury (HCC) 01/11/2015  . Alcohol abuse, in remission   . Anemia   . Anemia, iron deficiency 01/28/2015  . Arthritis   . Asthma   . Bronchiectasis (HCC) 03/07/2016  . Bronchitis   . COPD (chronic obstructive pulmonary disease) (HCC)   . Diabetes mellitus without complication (HCC)    type 2   . DM II (diabetes mellitus, type II), controlled (HCC) 01/06/2015  . Dyspnea and respiratory abnormality 04/06/2015  . Dysrhythmia    hx of palpitations  . E-coli UTI 01/11/2015  . Essential hypertension 01/06/2015  . GERD (gastroesophageal reflux disease)   . Hay fever   . Hepatitis    c treated with pills  . HLD (hyperlipidemia) 01/06/2015  . Hypertension   . IBS (irritable bowel syndrome)   . ILD (interstitial lung disease) (HCC) 12/11/2016  . Leg cramps, sleep related 12/01/2015  . Pneumonia   . Rheumatoid arthritis (HCC) 1996  . Sinus tachycardia 01/11/2015   LT monitor 01/2019: normal sinus rhythm, occ short bursts of PAT, accelerated junctional rhythm, no sig arrhythmias.     SURGICAL HISTORY: Past Surgical History:  Procedure Laterality Date  . ABDOMINAL HYSTERECTOMY    . BALLOON DILATION N/A 03/15/2018   Procedure: BALLOON DILATION;  Surgeon: Jeani Hawking, MD;  Location: [facility];  Service: Endoscopy;  Laterality: N/A;  . CARPAL TUNNEL RELEASE Left 04/19/2018   Procedure: CARPAL TUNNEL RELEASE;  Surgeon: Coletta Memos, MD;  Location: [facility];  Service: Neurosurgery;  Laterality: Left;  CARPAL TUNNEL RELEASE  . CESAREAN SECTION    . COLONOSCOPY WITH PROPOFOL N/A 04/02/2015   Procedure: COLONOSCOPY WITH PROPOFOL;  Surgeon: Jeani Hawking, MD;  Location: [facility];  Service: Endoscopy;  Laterality: N/A;  . ESOPHAGOGASTRODUODENOSCOPY (EGD) WITH PROPOFOL N/A 04/02/2015   Procedure: ESOPHAGOGASTRODUODENOSCOPY (EGD) WITH PROPOFOL;  Surgeon: Jeani Hawking, MD;  Location: [facility];  Service: Endoscopy;  Laterality: N/A;  . ESOPHAGOGASTRODUODENOSCOPY (EGD) WITH PROPOFOL N/A 03/15/2018   Procedure: ESOPHAGOGASTRODUODENOSCOPY (EGD) WITH PROPOFOL;  Surgeon: Jeani Hawking, MD;  Location: [facility];  Service: Endoscopy;  Laterality: N/A;  . HEMORRHOID SURGERY    . HERNIA REPAIR    . HERNIA REPAIR      I have reviewed the social history and family history with the patient and they are  unchanged from previous note.  ALLERGIES:  is allergic to fish allergy, peanuts [peanut oil], penicillins, iodinated diagnostic agents, iodine, percocet [oxycodone-acetaminophen], hydrocodone, other, oxycodone, and sulfa antibiotics.  MEDICATIONS:  Current Outpatient Medications  Medication Sig Dispense Refill  . albuterol (PROVENTIL) (2.5 MG/3ML) 0.083% nebulizer solution     . albuterol (VENTOLIN HFA) 108 (90 Base) MCG/ACT inhaler INHALE 2 PUFFS BY MOUTH EVERY 4-6 HRS AS NEEDED AS DIRECTED  54 g 5  . amLODipine (NORVASC) 5 MG tablet Take 1 tablet (5 mg total) by mouth in the morning and at bedtime. 180 tablet 3  . aspirin EC 81 MG tablet Take 1 tablet (81 mg total) by mouth daily. 90 tablet 3  . atorvastatin (LIPITOR) 40 MG tablet ONE TABLET BY MOUTH DAILY 90 tablet 2  . bumetanide (BUMEX) 2 MG tablet TAKE 1 TABLET BY MOUTH 2 TIMES A WEEK 24 tablet 3  . cyanocobalamin 1000 MCG tablet Take 1,000 mcg by mouth every Thursday.     . cyclobenzaprine (FLEXERIL) 10 MG tablet Take 10 mg by mouth at bedtime.     . diclofenac sodium (VOLTAREN) 1 % GEL APPLY 2 GRAM TO THE AFFECTED AREAS 4 TIMES A DAY AS NEEDED FOR KNEE PAIN  700 g 3  . diphenoxylate-atropine (LOMOTIL) 2.5-0.025 MG tablet Take 1 tablet by mouth 4 (four) times daily as needed for diarrhea or loose stools.     . docusate sodium (COLACE) 100 MG capsule Take 100 mg by mouth 2 (two) times daily as needed for mild constipation.    Marland Kitchen ezetimibe (ZETIA) 10 MG tablet ONE TABLET BY MOUTH DAILY 90 tablet 3  . ferrous sulfate 325 (65 FE) MG tablet Take 325 mg by mouth 2 (two) times daily with a meal.     . fluticasone (FLONASE) 50 MCG/ACT nasal spray Place 1 spray into both nostrils daily as needed for allergies or rhinitis.    . Fluticasone-Umeclidin-Vilant (TRELEGY ELLIPTA) 100-62.5-25 MCG/INH AEPB Inhale 1 puff into the lungs daily. 60 each 6  . Fluticasone-Umeclidin-Vilant (TRELEGY ELLIPTA) 100-62.5-25 MCG/INH AEPB Inhale 1 puff into the lungs  daily. 14 each 0  . folic acid (FOLVITE) 1 MG tablet TAKE 1 TABLET BY MOUTH DAILY 90 tablet 3  . ibuprofen (ADVIL) 800 MG tablet ONE TABLET BY MOUTH THREE TIMES A DAY AS NEEDED  60 tablet 0  . losartan (COZAAR) 50 MG tablet TAKE 1 TABLET BY MOUTH DAILY 90 tablet 3  . metFORMIN (GLUCOPHAGE) 500 MG tablet Take 500 mg by mouth 2 (two) times daily.    . montelukast (SINGULAIR) 10 MG tablet TAKE 1 TABLET BY MOUTH DAILY 90 tablet 3  . pantoprazole (PROTONIX) 40 MG tablet ONE TABLET BY MOUTH DAILY 90 tablet 2  . traZODone (DESYREL) 25 mg TABS tablet Take 25 mg by mouth at bedtime.    . Vitamin D, Ergocalciferol, (DRISDOL) 1.25 MG (50000 UNIT) CAPS capsule TAKE 1 CAPSULE BY MOUTH WEEKLY 12 capsule 0   No current facility-administered medications for this visit.    PHYSICAL EXAMINATION: ECOG PERFORMANCE STATUS: {CHL ONC ECOG [phone]}  There were no vitals filed for this visit. There were no vitals filed for this visit. *** GENERAL:alert, no distress and comfortable SKIN: skin color, texture, turgor are normal, no rashes  or significant lesions EYES: normal, Conjunctiva are pink and non-injected, sclera clear {OROPHARYNX:no exudate, no erythema and lips, buccal mucosa, and tongue normal}  NECK: supple, thyroid normal size, non-tender, without nodularity LYMPH:  no palpable lymphadenopathy in the cervical, axillary {or inguinal} LUNGS: clear to auscultation and percussion with normal breathing effort HEART: regular rate & rhythm and no murmurs and no lower extremity edema ABDOMEN:abdomen soft, non-tender and normal bowel sounds Musculoskeletal:no cyanosis of digits and no clubbing  NEURO: alert & oriented x 3 with fluent speech, no focal motor/sensory deficits  LABORATORY DATA:  I have reviewed the data as listed CBC Latest Ref Rng & Units 05/21/2020 04/09/2019 09/02/2018  WBC 4.0 - 10.5 K/uL 7.4 6.0 10.9(H)  Hemoglobin 12.0 - 15.0 g/dL 0.0(P) 10.9(L) 10.4(L)  Hematocrit 36.0 - 46.0 %  32.5(L) 32.4(L) 34.0(L)  Platelets 150 - 400 K/uL 320 281 400     CMP Latest Ref Rng & Units 07/26/2020 05/21/2020 04/09/2019  Glucose 65 - 99 mg/dL 233(A) 076(A) 90  BUN 8 - 27 mg/dL 18 19 19   Creatinine 0.57 - 1.00 mg/dL ) 2.63(F) 3.54(T)  Sodium 134 - 144 mmol/L 143 142 146(H)  Potassium 3.5 - 5.2 mmol/L 4.4 4.3 4.5  Chloride 96 - 106 mmol/L 100 103 102  CO2 20 - 29 mmol/L 26 30 27   Calcium 8.7 - 10.3 mg/dL 9.5 9.7 9.5  Total Protein 6.5 - 8.1 g/dL - 7.2 -  Total Bilirubin 0.3 - 1.2 mg/dL - 6.25(W) -  Alkaline Phos 38 - 126 U/L - 87 -  AST 15 - 41 U/L - 12(L) -  ALT 0 - 44 U/L - 12 -      RADIOGRAPHIC STUDIES: I have personally reviewed the radiological images as listed and agreed with the findings in the report. No results found.   ASSESSMENT & PLAN:  Chellsie Gomer Jenne is a 75 y.o. female with   1. Normocytic anemia secondary to iron deficiency, from GI AVM bleeding  -She has moderate anemia with baseline hemoglobin in 8-9 range, MCV normal, ferritin 16, serum iron 13, URBC elevated at 430, iron saturation 3%, this is consistent with iron deficient anemia -Anemia has been several years, likely secondary to slow GI bleeding. Her stool OB was positive. Colonoscopy showed AVM, last done in 03/2015. Her 03/15/18 Upper Endoscopy with Dr 04/2015 showed no evidence of bleeding.  -Her baseline SPEP and UPEP were negative.  -She also may have a component of anemia of chronic disease secondary to kidney disease and rheumatoid arthritis -She was treated with IV Feraheme 4 times in 2016 and  3 times in 2018. She responded well and was to continue oral iron.  -She lost follow up after 07/2018. She notes in the past year she has had significant fatigue and low energy. I reviewed her labs from today, Hg 9.8. She does not need blood transfusion at this time, but if her Ferritin or iron low, I will give IV Iron next week.  -I recommend she restart Oral B12 2019 daily, continue oral Folic acid, iron  ferrous sulfate BID with oranges or Vit C -will monitor with labs every 2 months and f/u 6 months.    2. Hypertension, diabetes, rheumatoid arthritis, asthma, CKD stage III -She will continue follow-up with her primary care physician and other specialists -Her CKD is secondary to her HTN and DM, she will continue to mange them.          -She has been on Ibuprofen for Rheumatoid Arthritis  3. Trouble sleeping  -Her increased fatigue is multifactorial including her trouble sleeping. I recommend she increase her Melatonin to 10-15mg .   Plan -Labs reviewed  -Continue oral iron BID, Folic Acid and restart oral B12.  -will set up iv iron if ferritin<100  -Lab in 2 and 4 and 6 months  -F/u in 6 moths    No problem-specific Assessment & Plan notes found for this encounter.   No orders of the defined types were placed in this encounter.  All questions were answered. The patient knows to call the clinic with any problems, questions or concerns. No barriers to learning was detected. The total time spent in the appointment was {CHL ONC TIME VISIT - [id number]}.     Anita Gonzalez 12/22/2020   Anita Gonzalez, am acting as scribe for Malachy Mood, MD.   {Add scribe attestation statement}

## 2020-12-24 ENCOUNTER — Inpatient Hospital Stay: Payer: Medicare HMO

## 2020-12-24 ENCOUNTER — Inpatient Hospital Stay: Payer: Medicare HMO | Admitting: Hematology

## 2020-12-28 ENCOUNTER — Other Ambulatory Visit: Payer: Self-pay | Admitting: Internal Medicine

## 2020-12-29 ENCOUNTER — Telehealth: Payer: Self-pay | Admitting: Internal Medicine

## 2020-12-29 ENCOUNTER — Telehealth: Payer: Self-pay | Admitting: Pulmonary Disease

## 2020-12-29 NOTE — Telephone Encounter (Signed)
Called and spoke with patient who states that her PCP Osei-Bonsu prescribed doxycycline medication. She states that she has done research on side effects & it says it could impact liver,kindey, joints, heart, muscle,lungs & weights pressure on brain  & wanted to get Dr. Francine Graven opinion on If she should take it based on current health conditions   Dr. Francine Graven  please advise [phone]

## 2020-12-29 NOTE — Telephone Encounter (Signed)
Doxycycline is an overall safe medication to take. She will be ok to take it given her medical conditions.   Thanks, Cletis Athens

## 2020-12-29 NOTE — Telephone Encounter (Signed)
Pt c/o medication issue:  1. Name of Medication: Doxycycline 100mg    2. How are you currently taking this medication (dosage and times per day)? n/a  3. Are you having a reaction (difficulty breathing--STAT)? no  4. What is your medication issue? Patient wants to know if Dr. thinks it would be [facility] for her to take this medication considering the side effects. She has not taken yet.

## 2020-12-29 NOTE — Telephone Encounter (Signed)
pt is calling about Doxycyl medication that was prescribed by PCP states that she done research on side effects & it says it could impact  liver,kindey, joints, heart, muscle,lungs & weights pressure on brain  & wanted to get Dr. Francine Graven opinion on If she should take it based on current health conditions   please advise [phone]

## 2020-12-29 NOTE — Telephone Encounter (Signed)
Do not see any significant drug drug interactions between doxycycline and her current medication list.  However, do not see why this medication was prescribed and what they are treating

## 2020-12-29 NOTE — Telephone Encounter (Signed)
Called and spoke with patient to let her know that Dr. Francine Graven feels safe with her taking medication. She expressed understanding. Nothing further needed at this time.

## 2020-12-30 ENCOUNTER — Telehealth: Payer: Self-pay

## 2020-12-30 NOTE — Telephone Encounter (Signed)
Returned call to pt who expressed concerned new order for Doxycycline(skin infection to thumb) would interfere with basic labs this nurse explains doxycycline is not noted to interfere with labs ordered

## 2020-12-30 NOTE — Telephone Encounter (Signed)
Pt is prescribed doxycycline 100 mg BID x 10 days for a skin infection on her thumb.  Adv that per our pharmacist,  There are no significant drug interactions with any of her listed medications.

## 2020-12-31 DIAGNOSIS — R6889 Other general symptoms and signs: Secondary | ICD-10-CM | POA: Diagnosis not present

## 2021-01-04 ENCOUNTER — Other Ambulatory Visit: Payer: Self-pay | Admitting: Internal Medicine

## 2021-01-05 DIAGNOSIS — R6889 Other general symptoms and signs: Secondary | ICD-10-CM | POA: Diagnosis not present

## 2021-01-05 NOTE — Progress Notes (Incomplete)
[facility]   [phone] [phone]   Clinic Follow up Note   Patient Care Team: [name], MD as PCP - General (Internal Medicine) Pricilla Riffle, MD as PCP - Cardiology (Cardiology) Jearld Lesch, MD as Referring Physician (Specialist) Jearld Lesch, MD as Referring Physician (Specialist) Jeani Hawking, MD as Consulting Physician (Gastroenterology) Clyde Lundborg., MD as Referring Physician (Anesthesiology) Larey Dresser, DPM as Consulting Physician (Podiatry)  Date of Service:  01/05/2021  CHIEF COMPLAINT: Follow-up iron deficient anemia   CURRENT THERAPY:  Oral iron Ferous sulfate BID, Folic Acid, oral B12 daily. IV Feraheme as needed, last in 07/2017.   INTERVAL HISTORY: *** Anita Gonzalez is here for a follow up of IDA. She was last seen by me 6 months ago. She presents to the clinic alone.    REVIEW OF SYSTEMS:  *** Constitutional: Denies fevers, chills or abnormal weight loss Eyes: Denies blurriness of vision Ears, nose, mouth, throat, and face: Denies mucositis or sore throat Respiratory: Denies cough, dyspnea or wheezes Cardiovascular: Denies palpitation, chest discomfort or lower extremity swelling Gastrointestinal:  Denies nausea, heartburn or change in bowel habits Skin: Denies abnormal skin rashes Lymphatics: Denies new lymphadenopathy or easy bruising Neurological:Denies numbness, tingling or new weaknesses Behavioral/Psych: Mood is stable, no new changes  All other systems were reviewed with the patient and are negative.  MEDICAL HISTORY:  Past Medical History:  Diagnosis Date  . Acute kidney injury (HCC) 01/11/2015  . Alcohol abuse, in remission   . Anemia   . Anemia, iron deficiency 01/28/2015  . Arthritis   . Asthma   . Bronchiectasis (HCC) 03/07/2016  . Bronchitis   . COPD (chronic obstructive pulmonary disease) (HCC)   . Diabetes mellitus without complication (HCC)    type 2   . DM II (diabetes mellitus, type II), controlled (HCC) 01/06/2015  . Dyspnea and respiratory abnormality 04/06/2015  . Dysrhythmia    hx of palpitations  . E-coli UTI 01/11/2015  . Essential hypertension 01/06/2015  . GERD (gastroesophageal reflux disease)   . Hay fever   . Hepatitis    c treated with pills  . HLD (hyperlipidemia) 01/06/2015  . Hypertension   . IBS (irritable bowel syndrome)   . ILD (interstitial lung disease) (HCC) 12/11/2016  . Leg cramps, sleep related 12/01/2015  . Pneumonia   . Rheumatoid arthritis (HCC) 1996  . Sinus tachycardia 01/11/2015   LT monitor 01/2019: normal sinus rhythm, occ short bursts of PAT, accelerated junctional rhythm, no sig arrhythmias.     SURGICAL HISTORY: Past Surgical History:  Procedure Laterality Date  . ABDOMINAL HYSTERECTOMY    . BALLOON DILATION N/A 03/15/2018   Procedure: BALLOON DILATION;  Surgeon: Jeani Hawking, MD;  Location: [facility];  Service: Endoscopy;  Laterality: N/A;  . CARPAL TUNNEL RELEASE Left 04/19/2018   Procedure: CARPAL TUNNEL RELEASE;  Surgeon: Coletta Memos, MD;  Location: [facility] OR;  Service: Neurosurgery;  Laterality: Left;  CARPAL TUNNEL RELEASE  . CESAREAN SECTION    . COLONOSCOPY WITH PROPOFOL N/A 04/02/2015   Procedure: COLONOSCOPY WITH PROPOFOL;  Surgeon: Jeani Hawking, MD;  Location: [facility];  Service: Endoscopy;  Laterality: N/A;  . ESOPHAGOGASTRODUODENOSCOPY (EGD) WITH PROPOFOL N/A 04/02/2015   Procedure: ESOPHAGOGASTRODUODENOSCOPY (EGD) WITH PROPOFOL;  Surgeon: Jeani Hawking, MD;  Location: [facility];  Service: Endoscopy;  Laterality: N/A;  . ESOPHAGOGASTRODUODENOSCOPY (EGD) WITH PROPOFOL N/A 03/15/2018   Procedure: ESOPHAGOGASTRODUODENOSCOPY (EGD) WITH PROPOFOL;  Surgeon: Jeani Hawking, MD;  Location: [facility];  Service: Endoscopy;  Laterality: N/A;  . HEMORRHOID SURGERY    . HERNIA REPAIR    . HERNIA REPAIR      I have reviewed the social history and family history with the patient and they are  unchanged from previous note.  ALLERGIES:  is allergic to fish allergy, peanuts [peanut oil], penicillins, iodinated diagnostic agents, iodine, percocet [oxycodone-acetaminophen], hydrocodone, other, oxycodone, and sulfa antibiotics.  MEDICATIONS:  Current Outpatient Medications  Medication Sig Dispense Refill  . albuterol (PROVENTIL) (2.5 MG/3ML) 0.083% nebulizer solution     . albuterol (VENTOLIN HFA) 108 (90 Base) MCG/ACT inhaler INHALE 2 PUFFS BY MOUTH EVERY 4-6 HRS AS NEEDED AS DIRECTED  54 g 5  . amLODipine (NORVASC) 5 MG tablet Take 1 tablet (5 mg total) by mouth in the morning and at bedtime. 180 tablet 3  . aspirin EC 81 MG tablet Take 1 tablet (81 mg total) by mouth daily. 90 tablet 3  . atorvastatin (LIPITOR) 40 MG tablet ONE TABLET BY MOUTH DAILY 90 tablet 2  . bumetanide (BUMEX) 2 MG tablet TAKE 1 TABLET BY MOUTH 2 TIMES A WEEK 24 tablet 3  . cyanocobalamin 1000 MCG tablet Take 1,000 mcg by mouth every Thursday.     . cyclobenzaprine (FLEXERIL) 10 MG tablet Take 10 mg by mouth at bedtime.     . diclofenac sodium (VOLTAREN) 1 % GEL APPLY 2 GRAM TO THE AFFECTED AREAS 4 TIMES A DAY AS NEEDED FOR KNEE PAIN  700 g 3  . diphenoxylate-atropine (LOMOTIL) 2.5-0.025 MG tablet Take 1 tablet by mouth 4 (four) times daily as needed for diarrhea or loose stools.     . docusate sodium (COLACE) 100 MG capsule Take 100 mg by mouth 2 (two) times daily as needed for mild constipation.    Marland Kitchen ezetimibe (ZETIA) 10 MG tablet ONE TABLET BY MOUTH DAILY 90 tablet 3  . ferrous sulfate 325 (65 FE) MG tablet Take 325 mg by mouth 2 (two) times daily with a meal.     . fluticasone (FLONASE) 50 MCG/ACT nasal spray Place 1 spray into both nostrils daily as needed for allergies or rhinitis.    . Fluticasone-Umeclidin-Vilant (TRELEGY ELLIPTA) 100-62.5-25 MCG/INH AEPB Inhale 1 puff into the lungs daily. 60 each 6  . Fluticasone-Umeclidin-Vilant (TRELEGY ELLIPTA) 100-62.5-25 MCG/INH AEPB Inhale 1 puff into the lungs  daily. 14 each 0  . folic acid (FOLVITE) 1 MG tablet TAKE 1 TABLET BY MOUTH DAILY 90 tablet 3  . ibuprofen (ADVIL) 800 MG tablet ONE TABLET BY MOUTH THREE TIMES A DAY AS NEEDED  60 tablet 0  . losartan (COZAAR) 50 MG tablet TAKE 1 TABLET BY MOUTH DAILY 90 tablet 3  . metFORMIN (GLUCOPHAGE) 500 MG tablet Take 500 mg by mouth 2 (two) times daily.    . montelukast (SINGULAIR) 10 MG tablet TAKE 1 TABLET BY MOUTH DAILY 90 tablet 3  . pantoprazole (PROTONIX) 40 MG tablet ONE TABLET BY MOUTH DAILY 90 tablet 2  . traZODone (DESYREL) 25 mg TABS tablet Take 25 mg by mouth at bedtime.    . Vitamin D, Ergocalciferol, (DRISDOL) 1.25 MG (50000 UNIT) CAPS capsule TAKE 1 CAPSULE BY MOUTH WEEKLY 12 capsule 0   No current facility-administered medications for this visit.    PHYSICAL EXAMINATION: ECOG PERFORMANCE STATUS: {CHL ONC ECOG PS:[phone]}  There were no vitals filed for this visit. There were no vitals filed for this visit. *** GENERAL:alert, no distress and comfortable SKIN: skin color, texture, turgor are normal,  no rashes or significant lesions EYES: normal, Conjunctiva are pink and non-injected, sclera clear {OROPHARYNX:no exudate, no erythema and lips, buccal mucosa, and tongue normal}  NECK: supple, thyroid normal size, non-tender, without nodularity LYMPH:  no palpable lymphadenopathy in the cervical, axillary {or inguinal} LUNGS: clear to auscultation and percussion with normal breathing effort HEART: regular rate & rhythm and no murmurs and no lower extremity edema ABDOMEN:abdomen soft, non-tender and normal bowel sounds Musculoskeletal:no cyanosis of digits and no clubbing  NEURO: alert & oriented x 3 with fluent speech, no focal motor/sensory deficits  LABORATORY DATA:  I have reviewed the data as listed CBC Latest Ref Rng & Units 05/21/2020 04/09/2019 09/02/2018  WBC 4.0 - 10.5 K/uL 7.4 6.0 10.9(H)  Hemoglobin 12.0 - 15.0 g/dL 1.7(E) 10.9(L) 10.4(L)  Hematocrit 36.0 - 46.0 %  32.5(L) 32.4(L) 34.0(L)  Platelets 150 - 400 K/uL 320 281 400     CMP Latest Ref Rng & Units 07/26/2020 05/21/2020 04/09/2019  Glucose 65 - 99 mg/dL 081(K) 481(E) 90  BUN 8 - 27 mg/dL 18 19 19   Creatinine 0.57 - 1.00 mg/dL ) 5.63(J) 4.97(W)  Sodium 134 - 144 mmol/L 143 142 146(H)  Potassium 3.5 - 5.2 mmol/L 4.4 4.3 4.5  Chloride 96 - 106 mmol/L 100 103 102  CO2 20 - 29 mmol/L 26 30 27   Calcium 8.7 - 10.3 mg/dL 9.5 9.7 9.5  Total Protein 6.5 - 8.1 g/dL - 7.2 -  Total Bilirubin 0.3 - 1.2 mg/dL - 2.63(Z) -  Alkaline Phos 38 - 126 U/L - 87 -  AST 15 - 41 U/L - 12(L) -  ALT 0 - 44 U/L - 12 -      RADIOGRAPHIC STUDIES: I have personally reviewed the radiological images as listed and agreed with the findings in the report. No results found.   ASSESSMENT & PLAN:  Anita Gonzalez is a 75 y.o. female with   1. Normocytic anemia secondary to iron deficiency, from GI AVM bleeding  -She has moderate anemia with baseline hemoglobin in 8-9 range, MCV normal, ferritin 16, serum iron 13, URBC elevated at 430, iron saturation 3%, this is consistent with iron deficient anemia -Anemia has been several years, likely secondary to slow GI bleeding. Her stool OB was positive. Colonoscopy showed AVM, last done in 03/2015. Her 03/15/18 Upper Endoscopy with Dr 04/2015 showed no evidence of bleeding.  -Her baseline SPEP and UPEP were negative.  -She also may have a component of anemia of chronic disease secondary to kidney disease and rheumatoid arthritis -She was treated with IV Feraheme 4 times in 2016 and  3 times in 2018. She responded well and was to continue oral iron.  -She lost follow up after 07/2018. She notes in the past year she has had significant fatigue and low energy. I reviewed her labs from today, Hg 9.8. She does not need blood transfusion at this time, but if her Ferritin or iron low, I will give IV Iron next week.  -I recommend she restart Oral B12 2019 daily, continue oral Folic acid, iron  ferrous sulfate BID with oranges or Vit C -will monitor with labs every 2 months and f/u 6 months.    2. Hypertension, diabetes, rheumatoid arthritis, asthma, CKD stage III -She will continue follow-up with her primary care physician and other specialists -Her CKD is secondary to her HTN and DM, she will continue to mange them.          -She has been on Ibuprofen for  Rheumatoid Arthritis         3. Trouble sleeping  -Her increased fatigue is multifactorial including her trouble sleeping. I recommend she increase her Melatonin to 10-15mg .   Plan -Labs reviewed  -Continue oral iron BID, Folic Acid and restart oral B12.  -will set up iv iron if ferritin<100  -Lab in 2 and 4 and 6 months  -F/u in 6 moths    No problem-specific Assessment & Plan notes found for this encounter.   No orders of the defined types were placed in this encounter.  All questions were answered. The patient knows to call the clinic with any problems, questions or concerns. No barriers to learning was detected. The total time spent in the appointment was {CHL ONC TIME VISIT - BJYNW:[id number]}.     Delphina Cahill 01/05/2021   Rogelia Rohrer, am acting as scribe for Malachy Mood, MD.   {Add scribe attestation statement}

## 2021-01-06 ENCOUNTER — Telehealth: Payer: Self-pay

## 2021-01-06 ENCOUNTER — Other Ambulatory Visit: Payer: Self-pay | Admitting: Internal Medicine

## 2021-01-06 NOTE — Telephone Encounter (Signed)
Anita Gonzalez called stating she has a "head cold" she requests to reschedule her appts for 2/25.  Scheduling message sent

## 2021-01-07 ENCOUNTER — Inpatient Hospital Stay: Payer: Medicare HMO

## 2021-01-07 ENCOUNTER — Inpatient Hospital Stay: Payer: Medicare HMO | Admitting: Hematology

## 2021-01-07 ENCOUNTER — Telehealth: Payer: Self-pay | Admitting: Hematology

## 2021-01-07 NOTE — Telephone Encounter (Signed)
Scheduled appt per 2/24 sch msg -. Called pt . No answer / unable to leave message - mailed letter with appt date and time

## 2021-01-11 DIAGNOSIS — E119 Type 2 diabetes mellitus without complications: Secondary | ICD-10-CM | POA: Diagnosis not present

## 2021-01-11 DIAGNOSIS — J9611 Chronic respiratory failure with hypoxia: Secondary | ICD-10-CM | POA: Diagnosis not present

## 2021-01-12 DIAGNOSIS — R1013 Epigastric pain: Secondary | ICD-10-CM | POA: Diagnosis not present

## 2021-01-12 DIAGNOSIS — J45909 Unspecified asthma, uncomplicated: Secondary | ICD-10-CM | POA: Diagnosis not present

## 2021-01-12 DIAGNOSIS — G4733 Obstructive sleep apnea (adult) (pediatric): Secondary | ICD-10-CM | POA: Diagnosis not present

## 2021-01-12 DIAGNOSIS — I119 Hypertensive heart disease without heart failure: Secondary | ICD-10-CM | POA: Diagnosis not present

## 2021-01-12 DIAGNOSIS — Z0001 Encounter for general adult medical examination with abnormal findings: Secondary | ICD-10-CM | POA: Diagnosis not present

## 2021-01-12 DIAGNOSIS — E1129 Type 2 diabetes mellitus with other diabetic kidney complication: Secondary | ICD-10-CM | POA: Diagnosis not present

## 2021-01-12 DIAGNOSIS — G8929 Other chronic pain: Secondary | ICD-10-CM | POA: Diagnosis not present

## 2021-01-12 DIAGNOSIS — E559 Vitamin D deficiency, unspecified: Secondary | ICD-10-CM | POA: Diagnosis not present

## 2021-01-12 DIAGNOSIS — E782 Mixed hyperlipidemia: Secondary | ICD-10-CM | POA: Diagnosis not present

## 2021-01-12 DIAGNOSIS — R6889 Other general symptoms and signs: Secondary | ICD-10-CM | POA: Diagnosis not present

## 2021-01-17 DIAGNOSIS — R6889 Other general symptoms and signs: Secondary | ICD-10-CM | POA: Diagnosis not present

## 2021-01-17 DIAGNOSIS — K7402 Hepatic fibrosis, advanced fibrosis: Secondary | ICD-10-CM | POA: Diagnosis not present

## 2021-01-25 ENCOUNTER — Other Ambulatory Visit: Payer: Self-pay | Admitting: Internal Medicine

## 2021-01-26 ENCOUNTER — Other Ambulatory Visit: Payer: Self-pay | Admitting: Internal Medicine

## 2021-01-27 ENCOUNTER — Other Ambulatory Visit: Payer: Self-pay

## 2021-01-27 ENCOUNTER — Ambulatory Visit (INDEPENDENT_AMBULATORY_CARE_PROVIDER_SITE_OTHER): Payer: Medicare HMO | Admitting: Pulmonary Disease

## 2021-01-27 ENCOUNTER — Encounter: Payer: Self-pay | Admitting: Pulmonary Disease

## 2021-01-27 VITALS — BP 128/82 | HR 88 | Temp 97.0°F | Ht 61.0 in | Wt 213.0 lb

## 2021-01-27 DIAGNOSIS — J4550 Severe persistent asthma, uncomplicated: Secondary | ICD-10-CM | POA: Diagnosis not present

## 2021-01-27 DIAGNOSIS — J398 Other specified diseases of upper respiratory tract: Secondary | ICD-10-CM

## 2021-01-27 DIAGNOSIS — R6889 Other general symptoms and signs: Secondary | ICD-10-CM | POA: Diagnosis not present

## 2021-01-27 DIAGNOSIS — J479 Bronchiectasis, uncomplicated: Secondary | ICD-10-CM | POA: Diagnosis not present

## 2021-01-27 NOTE — Progress Notes (Signed)
Synopsis: Returns to clinic to re-establish care  Subjective:   PATIENT ID: Anita Gonzalez GENDER: female DOB: 10-30-1946, MRN: [id number]  HPI  Chief Complaint  Patient presents with  . Follow-up    3 mo f/u. States she is still having problems with SOB with exertion at times. Still using 2L of O2 as needed. Cough has been stable.    Anita Gonzalez is a 75 year old woman, former smoker with chronic hypoxemic respiratory failure on 2L O2 with ambulation, asthma and obstructive sleep apnea who returns to pulmonary clinic for follow up.    She was transitioned from symbicort to trelegy at last visit with improvement in her breathing and cough. She does report a tickle in her throat of recent which she thinks is related to spring time allergies.   She was diagnosed with sleep apnea in the past and has not been on CPAP as she did not tolerate it previously.   OV 01/27/21: She continues to experience exertional dyspnea and is using 2L while ambulating. She complains of productive cough during the day but it does not disturb her sleep. She does have sinus congestion and pressure which she is using flonase and saline nasal spray. She has been using symbicort and as needed albuterol nebs for her shortness of breath. She does complain of wheezing as well.  PFTs from 2017 show FEV1/FVC 89, FEV1 1.03L (63%), FVC 1.16L (55%), TLC 2.84 (61%), DLCO 68% consistent with a mild restrictive defect and mild diffusion defect.   HR CT Chest in 2017 showed scattered areas of mild cylindrical bronchiectasis and associated thickening of the peribronchovascular interstitium. Inspiratory and expiratory imaging demonstrates air trapping. There is severe collapse of the trachea and mild collapse of the mainstem bronchi on expiratory phase indicative of tracheobronchomalacia.    Past Medical History:  Diagnosis Date  . Acute kidney injury (HCC) 01/11/2015  . Alcohol abuse, in remission   . Anemia   . Anemia, iron  deficiency 01/28/2015  . Arthritis   . Asthma   . Bronchiectasis (HCC) 03/07/2016  . Bronchitis   . COPD (chronic obstructive pulmonary disease) (HCC)   . Diabetes mellitus without complication (HCC)    type 2  . DM II (diabetes mellitus, type II), controlled (HCC) 01/06/2015  . Dyspnea and respiratory abnormality 04/06/2015  . Dysrhythmia    hx of palpitations  . E-coli UTI 01/11/2015  . Essential hypertension 01/06/2015  . GERD (gastroesophageal reflux disease)   . Hay fever   . Hepatitis    c treated with pills  . HLD (hyperlipidemia) 01/06/2015  . Hypertension   . IBS (irritable bowel syndrome)   . ILD (interstitial lung disease) (HCC) 12/11/2016  . Leg cramps, sleep related 12/01/2015  . Pneumonia   . Rheumatoid arthritis (HCC) 1996  . Sinus tachycardia 01/11/2015   LT monitor 01/2019: normal sinus rhythm, occ short bursts of PAT, accelerated junctional rhythm, no sig arrhythmias.      Family History  Problem Relation Age of Onset  . Diabetes Mother   . Heart failure Mother   . Throat cancer Brother        throat cancer   . Asthma Other   . COPD Other   . Hypertension Other   . Stroke Other   . Heart disease Other      Social History   Socioeconomic History  . Marital status: Single    Spouse name: Not on file  . Number of children: Not on file  .  Years of education: Not on file  . Highest education level: Not on file  Occupational History  . Occupation: retired  Tobacco Use  . Smoking status: Former Smoker    Packs/day: 0.75    Years: 25.00    Pack years: 18.75    Types: Cigarettes    Quit date: 01/12/2011    Years since quitting: 10.0  . Smokeless tobacco: Never Used  Vaping Use  . Vaping Use: Never used  Substance and Sexual Activity  . Alcohol use: Yes    Alcohol/week: 4.0 standard drinks    Types: 4 Cans of beer per week    Comment: heavy drinker in the past  . Drug use: No  . Sexual activity: Not Currently  Other Topics Concern  . Not on file   Social History Narrative  . Not on file   Social Determinants of Health   Financial Resource Strain: Not on file  Food Insecurity: Not on file  Transportation Needs: Not on file  Physical Activity: Not on file  Stress: Not on file  Social Connections: Not on file  Intimate Partner Violence: Not on file     Allergies  Allergen Reactions  . Fish Allergy Anaphylaxis, Hives and Swelling  . Peanuts [Peanut Oil] Anaphylaxis, Hives, Itching and Swelling  . Penicillins Hives, Itching, Swelling and Other (See Comments)    PATIENT HAS HAD A PCN REACTION WITH IMMEDIATE RASH, FACIAL/TONGUE/THROAT SWELLING, SOB, OR LIGHTHEADEDNESS WITH HYPOTENSION:  #  #  YES  #  #  HAS PT DEVELOPED SEVERE RASH INVOLVING MUCUS MEMBRANES or SKIN NECROSIS: #  #  YES  #  # PATIENT HAS HAD A PCN REACTION THAT REQUIRED HOSPITALIZATION:  #  #  YES  #  #  Has patient had a PCN reaction occurring within the last 10 years: No   . Iodinated Diagnostic Agents Hives, Itching and Swelling    SWELLING REACTION UNSPECIFIED   . Iodine Hives, Itching and Swelling    Patient reports receiving IV dye in abdominal CT scan without issue  . Percocet [Oxycodone-Acetaminophen] Nausea Only, Anxiety and Other (See Comments)    Raised blood pressure, Sweats, nervous,dizziness  . Hydrocodone Itching and Nausea Only  . Other Other (See Comments)    UNSPECIFIED REACTION  Hypersensitive to perfumes, colognes, powders, lotions, room sprays  . Oxycodone Itching, Nausea Only and Other (See Comments)    Sweats and dizziness  . Sulfa Antibiotics     UNSPECIFIED REACTION      Outpatient Medications Prior to Visit  Medication Sig Dispense Refill  . albuterol (PROVENTIL) (2.5 MG/3ML) 0.083% nebulizer solution     . albuterol (VENTOLIN HFA) 108 (90 Base) MCG/ACT inhaler INHALE 2 PUFFS BY MOUTH EVERY 4-6 HRS AS NEEDED AS DIRECTED  54 g 5  . amLODipine (NORVASC) 5 MG tablet Take 1 tablet (5 mg total) by mouth in the morning and at bedtime.  180 tablet 3  . aspirin EC 81 MG tablet Take 1 tablet (81 mg total) by mouth daily. 90 tablet 3  . atorvastatin (LIPITOR) 40 MG tablet ONE TABLET BY MOUTH DAILY 90 tablet 2  . bumetanide (BUMEX) 2 MG tablet TAKE 1 TABLET BY MOUTH 2 TIMES A WEEK 24 tablet 3  . cyanocobalamin 1000 MCG tablet Take 1,000 mcg by mouth every Thursday.     . cyclobenzaprine (FLEXERIL) 10 MG tablet Take 10 mg by mouth at bedtime.     . diclofenac sodium (VOLTAREN) 1 % GEL APPLY 2 GRAM TO  THE AFFECTED AREAS 4 TIMES A DAY AS NEEDED FOR KNEE PAIN  700 g 3  . diphenoxylate-atropine (LOMOTIL) 2.5-0.025 MG tablet Take 1 tablet by mouth 4 (four) times daily as needed for diarrhea or loose stools.     . docusate sodium (COLACE) 100 MG capsule Take 100 mg by mouth 2 (two) times daily as needed for mild constipation.    Marland Kitchen ezetimibe (ZETIA) 10 MG tablet ONE TABLET BY MOUTH DAILY 90 tablet 3  . ferrous sulfate 325 (65 FE) MG tablet Take 325 mg by mouth 2 (two) times daily with a meal.     . fluticasone (FLONASE) 50 MCG/ACT nasal spray Place 1 spray into both nostrils daily as needed for allergies or rhinitis.    . Fluticasone-Umeclidin-Vilant (TRELEGY ELLIPTA) 100-62.5-25 MCG/INH AEPB Inhale 1 puff into the lungs daily. 60 each 6  . folic acid (FOLVITE) 1 MG tablet TAKE 1 TABLET BY MOUTH DAILY 90 tablet 3  . ibuprofen (ADVIL) 800 MG tablet ONE TABLET BY MOUTH THREE TIMES A DAY AS NEEDED  60 tablet 0  . losartan (COZAAR) 50 MG tablet TAKE 1 TABLET BY MOUTH DAILY 90 tablet 3  . montelukast (SINGULAIR) 10 MG tablet TAKE 1 TABLET BY MOUTH DAILY 90 tablet 3  . pantoprazole (PROTONIX) 40 MG tablet ONE TABLET BY MOUTH DAILY 90 tablet 2  . traZODone (DESYREL) 25 mg TABS tablet Take 25 mg by mouth at bedtime.    . Vitamin D, Ergocalciferol, (DRISDOL) 1.25 MG (50000 UNIT) CAPS capsule TAKE 1 CAPSULE BY MOUTH WEEKLY 12 capsule 0  . metFORMIN (GLUCOPHAGE) 500 MG tablet Take 500 mg by mouth 2 (two) times daily.    Marland Kitchen gabapentin (NEURONTIN) 300  MG capsule Take 300 mg by mouth in the morning, at noon, and at bedtime.    . metFORMIN (GLUCOPHAGE-XR) 500 MG 24 hr tablet     . Fluticasone-Umeclidin-Vilant (TRELEGY ELLIPTA) 100-62.5-25 MCG/INH AEPB Inhale 1 puff into the lungs daily. 14 each 0  . verapamil (VERELAN PM) 180 MG 24 hr capsule Take by mouth.     No facility-administered medications prior to visit.    Review of Systems  Constitutional: Negative for chills, fever, malaise/fatigue and weight loss.  HENT: Negative for congestion, sinus pain and sore throat.   Eyes: Negative.   Respiratory: Positive for cough, sputum production and shortness of breath. Negative for hemoptysis and wheezing.   Cardiovascular: Negative for chest pain, palpitations, orthopnea, claudication, leg swelling and PND.  Gastrointestinal: Negative for abdominal pain, heartburn, nausea and vomiting.  Genitourinary: Negative.   Musculoskeletal: Negative.   Neurological: Negative for dizziness, weakness and headaches.  Psychiatric/Behavioral: Negative.     Objective:   Vitals:   01/27/21 1450  BP: 128/82  Pulse: 88  Temp: (!) 97 F (36.1 C)  TempSrc: Temporal  SpO2: 93%  Weight: 213 lb (96.6 kg)  Height: 5\' 1"  (1.549 m)    Physical Exam Constitutional:      General: She is not in acute distress.    Appearance: She is obese.  HENT:     Head: Normocephalic and atraumatic.     Nose: Nose normal.     Mouth/Throat:     Mouth: Mucous membranes are moist.     Pharynx: Oropharynx is clear.  Eyes:     General: No scleral icterus.    Conjunctiva/sclera: Conjunctivae normal.     Pupils: Pupils are equal, round, and reactive to light.  Cardiovascular:     Rate and Rhythm: Normal rate  and regular rhythm.     Pulses: Normal pulses.     Heart sounds: Normal heart sounds. No murmur heard.   Pulmonary:     Effort: Pulmonary effort is normal.     Breath sounds: No wheezing, rhonchi or rales.  Abdominal:     General: Bowel sounds are normal.      Palpations: Abdomen is soft.  Musculoskeletal:     Cervical back: Neck supple.     Right lower leg: No edema.     Left lower leg: No edema.  Lymphadenopathy:     Cervical: No cervical adenopathy.  Skin:    General: Skin is warm and dry.  Neurological:     General: No focal deficit present.     Mental Status: She is alert.  Psychiatric:        Mood and Affect: Mood normal.        Behavior: Behavior normal.        Thought Content: Thought content normal.        Judgment: Judgment normal.     CBC    Component Value Date/Time   WBC 7.4 05/21/2020 1444   WBC 10.9 (H) 09/02/2018 0414   RBC 3.24 (L) 05/21/2020 1444   HGB 9.8 (L) 05/21/2020 1444   HGB 10.9 (L) 04/09/2019 1530   HGB 11.9 07/24/2017 1334   HCT 32.5 (L) 05/21/2020 1444   HCT 32.4 (L) 04/09/2019 1530   HCT 37.1 07/24/2017 1334   PLT 320 05/21/2020 1444   PLT 281 04/09/2019 1530   MCV 100.3 (H) 05/21/2020 1444   MCV 93 04/09/2019 1530   MCV 96.7 07/24/2017 1334   MCH 30.2 05/21/2020 1444   MCHC 30.2 05/21/2020 1444   RDW 14.2 05/21/2020 1444   RDW 13.3 04/09/2019 1530   RDW 14.6 (H) 07/24/2017 1334   LYMPHSABS 1.7 05/21/2020 1444   LYMPHSABS 1.8 07/24/2017 1334   MONOABS 0.5 05/21/2020 1444   MONOABS 0.4 07/24/2017 1334   EOSABS 0.4 05/21/2020 1444   EOSABS 0.2 07/24/2017 1334   BASOSABS 0.0 05/21/2020 1444   BASOSABS 0.0 07/24/2017 1334   BMP Latest Ref Rng & Units 07/26/2020 05/21/2020 04/09/2019  Glucose 65 - 99 mg/dL 539(J) 673(A) 90  BUN 8 - 27 mg/dL 18 19 19   Creatinine 0.57 - 1.00 mg/dL ) 1.93(X) 9.02(I)  BUN/Creat Ratio 12 - 28 11(L) - 16  Sodium 134 - 144 mmol/L 143 142 146(H)  Potassium 3.5 - 5.2 mmol/L 4.4 4.3 4.5  Chloride 96 - 106 mmol/L 100 103 102  CO2 20 - 29 mmol/L 26 30 27   Calcium 8.7 - 10.3 mg/dL 9.5 9.7 9.5   Chest imaging: CXR 09/02/2018 There is shallow lung inflation. The cardiomediastinal contours are normal.  There is no focal airspace consolidation or pulmonary  edema. There is no pleural effusion or pneumothorax.  HRCT Chest 06/08/2016 1. Again noted is mild diffuse cylindrical bronchiectasis and thickening of the peribronchovascular interstitium. The subtle areas of ground-glass attenuation and septal thickening in the lungs persist compared to the prior study, but appear unchanged, and may indicate areas of nonspecific interstitial pneumonia (NSIP). 2. Today's study also demonstrates mild air trapping, indicative of small airways disease, and demonstrates evidence of tracheobronchomalacia. In retrospect, the prior expiratory phase images were unlikely to be truly expiratory on study 04/13/2015. 3. Aortic atherosclerosis, in addition to three-vessel coronary artery disease. Please note that although the presence of coronary artery calcium documents the presence of coronary artery disease, the severity of this  disease and any potential stenosis cannot be assessed on this non-gated CT examination. Assessment for potential risk factor modification, dietary therapy or pharmacologic therapy may be warranted, if clinically indicated. 4. Cardiomegaly with mild left atrial dilatation.  V/Q Scan 05/30/2015 No ventilation or perfusion defects identified. Very low probability of pulmonary embolus.  PFT: PFT Results Latest Ref Rng & Units 05/30/2016 04/16/2015  FVC-Pre L 1.16 1.18  FVC-Predicted Pre % 55 55  FVC-Post L 1.16 1.37  FVC-Predicted Post % 55 64  Pre FEV1/FVC % % 88 88  Post FEV1/FCV % % 89 88  FEV1-Pre L 1.02 1.04  FEV1-Predicted Pre % 63 62  FEV1-Post L 1.03 1.20  DLCO uncorrected ml/min/mmHg 13.80 7.13  DLCO UNC% % 68 35  DLCO corrected ml/min/mmHg 14.85 7.91  DLCO COR %Predicted % 73 39  DLVA Predicted % 147 85  TLC L 2.84 2.72  TLC % Predicted % 61 59  RV % Predicted % 74 71   Echo: 04/13/2015 - Left ventricle: The cavity size was normal. Wall thickness was  normal. Systolic function was normal. The estimated ejection   fraction was in the range of 60% to 65%. Wall motion was normal;  there were no regional wall motion abnormalities. Doppler  parameters are consistent with abnormal left ventricular  relaxation (grade 1 diastolic dysfunction).  - Left atrium: The atrium was mildly dilated.   Stress Test 01/31/2017 1. EF 66%, normal wall motion.  2. Fixed, small, mild mid anterior perfusion defect.  Given normal wall motion, suspect soft tissue attenuation rather than infarction.  No ischemia.  3. Low risk study.   Assessment & Plan:   Severe persistent asthma without complication  Bronchiectasis without complication (HCC)  Tracheobronchomalacia  Class 3 severe obesity with serious comorbidity in adult, unspecified BMI, unspecified obesity type (HCC)  Discussion: Tagen Brethauer is a 75 year old woman, former smoker with chronic hypoxemic respiratory failure on 2L O2 with ambulation, asthma, bronchiectasis, tracheobronchomalacia and obstructive sleep apnea who returns to pulmonary clinic for follow up.     Her asthma is better controlled with Trelegy Ellipta 1 puff daily and as needed albuterol.   She has been referred to weight management clinic at the last visit in order to reduce her risk of obstructive sleep apnea. We have discussed the importance of treating sleep apnea as it can lead to risks of heart arrhythmias, heart failure and strokes.   Follow up in 4 months  Anita Comas, MD [facility] Office: [phone]   See Amion for Pager Details    Current Outpatient Medications:  .  albuterol (PROVENTIL) (2.5 MG/3ML) 0.083% nebulizer solution, , Disp: , Rfl:  .  albuterol (VENTOLIN HFA) 108 (90 Base) MCG/ACT inhaler, INHALE 2 PUFFS BY MOUTH EVERY 4-6 HRS AS NEEDED AS DIRECTED , Disp: 54 g, Rfl: 5 .  amLODipine (NORVASC) 5 MG tablet, Take 1 tablet (5 mg total) by mouth in the morning and at bedtime., Disp: 180 tablet, Rfl: 3 .  aspirin EC 81 MG tablet, Take  1 tablet (81 mg total) by mouth daily., Disp: 90 tablet, Rfl: 3 .  atorvastatin (LIPITOR) 40 MG tablet, ONE TABLET BY MOUTH DAILY, Disp: 90 tablet, Rfl: 2 .  bumetanide (BUMEX) 2 MG tablet, TAKE 1 TABLET BY MOUTH 2 TIMES A WEEK, Disp: 24 tablet, Rfl: 3 .  cyanocobalamin 1000 MCG tablet, Take 1,000 mcg by mouth every Thursday. , Disp: , Rfl:  .  cyclobenzaprine (FLEXERIL) 10 MG tablet, Take  10 mg by mouth at bedtime. , Disp: , Rfl:  .  diclofenac sodium (VOLTAREN) 1 % GEL, APPLY 2 GRAM TO THE AFFECTED AREAS 4 TIMES A DAY AS NEEDED FOR KNEE PAIN , Disp: 700 g, Rfl: 3 .  diphenoxylate-atropine (LOMOTIL) 2.5-0.025 MG tablet, Take 1 tablet by mouth 4 (four) times daily as needed for diarrhea or loose stools. , Disp: , Rfl:  .  docusate sodium (COLACE) 100 MG capsule, Take 100 mg by mouth 2 (two) times daily as needed for mild constipation., Disp: , Rfl:  .  ezetimibe (ZETIA) 10 MG tablet, ONE TABLET BY MOUTH DAILY, Disp: 90 tablet, Rfl: 3 .  ferrous sulfate 325 (65 FE) MG tablet, Take 325 mg by mouth 2 (two) times daily with a meal. , Disp: , Rfl:  .  fluticasone (FLONASE) 50 MCG/ACT nasal spray, Place 1 spray into both nostrils daily as needed for allergies or rhinitis., Disp: , Rfl:  .  Fluticasone-Umeclidin-Vilant (TRELEGY ELLIPTA) 100-62.5-25 MCG/INH AEPB, Inhale 1 puff into the lungs daily., Disp: 60 each, Rfl: 6 .  folic acid (FOLVITE) 1 MG tablet, TAKE 1 TABLET BY MOUTH DAILY, Disp: 90 tablet, Rfl: 3 .  ibuprofen (ADVIL) 800 MG tablet, ONE TABLET BY MOUTH THREE TIMES A DAY AS NEEDED , Disp: 60 tablet, Rfl: 0 .  losartan (COZAAR) 50 MG tablet, TAKE 1 TABLET BY MOUTH DAILY, Disp: 90 tablet, Rfl: 3 .  montelukast (SINGULAIR) 10 MG tablet, TAKE 1 TABLET BY MOUTH DAILY, Disp: 90 tablet, Rfl: 3 .  pantoprazole (PROTONIX) 40 MG tablet, ONE TABLET BY MOUTH DAILY, Disp: 90 tablet, Rfl: 2 .  traZODone (DESYREL) 25 mg TABS tablet, Take 25 mg by mouth at bedtime., Disp: , Rfl:  .  Vitamin D, Ergocalciferol,  (DRISDOL) 1.25 MG (50000 UNIT) CAPS capsule, TAKE 1 CAPSULE BY MOUTH WEEKLY, Disp: 12 capsule, Rfl: 0 .  gabapentin (NEURONTIN) 300 MG capsule, Take 300 mg by mouth in the morning, at noon, and at bedtime., Disp: , Rfl:  .  metFORMIN (GLUCOPHAGE-XR) 500 MG 24 hr tablet, , Disp: , Rfl:

## 2021-01-27 NOTE — Patient Instructions (Addendum)
Continue on trelegy inhaler daily  Continue to use albuterol as needed  Please consider having repeat sleep study done in the future.

## 2021-02-02 ENCOUNTER — Other Ambulatory Visit: Payer: Medicare HMO

## 2021-02-02 ENCOUNTER — Encounter: Payer: Self-pay | Admitting: Pulmonary Disease

## 2021-02-02 NOTE — Progress Notes (Signed)
[facility]   [phone] [phone]   Clinic Follow up Note   Patient Care Team: [name], MD as PCP - General (Internal Medicine) Pricilla Riffle, MD as PCP - Cardiology (Cardiology) Jearld Lesch, MD as Referring Physician (Specialist) Jearld Lesch, MD as Referring Physician (Specialist) Jeani Hawking, MD as Consulting Physician (Gastroenterology) Clyde Lundborg., MD as Referring Physician (Anesthesiology) Larey Dresser, DPM as Consulting Physician (Podiatry)  Date of Service:  02/04/2021  CHIEF COMPLAINT: Follow-up iron deficient anemia  CURRENT THERAPY:  Oral iron Ferous sulfate BID, Folic Acid, oral B12 daily. IV Feraheme as needed, last in 05/2020.   INTERVAL HISTORY:  Anita Gonzalez is here for a follow up of IDA. She was last seen by me 8 months ago. She presents to the clinic alone. She notes she is fatigued. She notes she does more personal care but not much cooking or house care. She notes she is sitting or laying down most of the time. Her energy is overall worse than before. She denies bloody or black stool. She notes she is seeing ophthalmologist, GI, (Dr. Elnoria Howard) pulmonologist, PCP and Liver doctor for prior hep C treatment and follow up. I reviewed her medication list with her.  She notes her sleep has improved but still wakes up 4-5AM and able to return to sleep.    REVIEW OF SYSTEMS:   Constitutional: Denies fevers, chills or abnormal weight loss (+) increased fatigue  Eyes: Denies blurriness of vision Ears, nose, mouth, throat, and face: Denies mucositis or sore throat Respiratory: Denies cough, dyspnea or wheezes Cardiovascular: Denies palpitation, chest discomfort or lower extremity swelling Gastrointestinal:  Denies nausea, heartburn or change in bowel habits Skin: Denies abnormal skin rashes Lymphatics: Denies new lymphadenopathy or easy bruising Neurological:Denies numbness, tingling or  new weaknesses Behavioral/Psych: Mood is stable, no new changes  All other systems were reviewed with the patient and are negative.  MEDICAL HISTORY:  Past Medical History:  Diagnosis Date  . Acute kidney injury (HCC) 01/11/2015  . Alcohol abuse, in remission   . Anemia   . Anemia, iron deficiency 01/28/2015  . Arthritis   . Asthma   . Bronchiectasis (HCC) 03/07/2016  . Bronchitis   . COPD (chronic obstructive pulmonary disease) (HCC)   . Diabetes mellitus without complication (HCC)    type 2  . DM II (diabetes mellitus, type II), controlled (HCC) 01/06/2015  . Dyspnea and respiratory abnormality 04/06/2015  . Dysrhythmia    hx of palpitations  . E-coli UTI 01/11/2015  . Essential hypertension 01/06/2015  . GERD (gastroesophageal reflux disease)   . Hay fever   . Hepatitis    c treated with pills  . HLD (hyperlipidemia) 01/06/2015  . Hypertension   . IBS (irritable bowel syndrome)   . ILD (interstitial lung disease) (HCC) 12/11/2016  . Leg cramps, sleep related 12/01/2015  . Pneumonia   . Rheumatoid arthritis (HCC) 1996  . Sinus tachycardia 01/11/2015   LT monitor 01/2019: normal sinus rhythm, occ short bursts of PAT, accelerated junctional rhythm, no sig arrhythmias.     SURGICAL HISTORY: Past Surgical History:  Procedure Laterality Date  . ABDOMINAL HYSTERECTOMY    . BALLOON DILATION N/A 03/15/2018   Procedure: BALLOON DILATION;  Surgeon: Jeani Hawking, MD;  Location: [facility];  Service: Endoscopy;  Laterality: N/A;  . CARPAL TUNNEL RELEASE Left 04/19/2018   Procedure: CARPAL TUNNEL RELEASE;  Surgeon: Coletta Memos, MD;  Location: [facility] OR;  Service: Neurosurgery;  Laterality: Left;  CARPAL TUNNEL RELEASE  . CESAREAN SECTION    . COLONOSCOPY WITH PROPOFOL N/A 04/02/2015   Procedure: COLONOSCOPY WITH PROPOFOL;  Surgeon: Jeani Hawking, MD;  Location: [facility];  Service: Endoscopy;  Laterality: N/A;  . ESOPHAGOGASTRODUODENOSCOPY (EGD) WITH PROPOFOL N/A 04/02/2015   Procedure:  ESOPHAGOGASTRODUODENOSCOPY (EGD) WITH PROPOFOL;  Surgeon: Jeani Hawking, MD;  Location: [facility];  Service: Endoscopy;  Laterality: N/A;  . ESOPHAGOGASTRODUODENOSCOPY (EGD) WITH PROPOFOL N/A 03/15/2018   Procedure: ESOPHAGOGASTRODUODENOSCOPY (EGD) WITH PROPOFOL;  Surgeon: Jeani Hawking, MD;  Location: [facility];  Service: Endoscopy;  Laterality: N/A;  . HEMORRHOID SURGERY    . HERNIA REPAIR    . HERNIA REPAIR      I have reviewed the social history and family history with the patient and they are unchanged from previous note.  ALLERGIES:  is allergic to fish allergy, peanuts [peanut oil], penicillins, iodinated diagnostic agents, iodine, percocet [oxycodone-acetaminophen], hydrocodone, other, oxycodone, and sulfa antibiotics.  MEDICATIONS:  Current Outpatient Medications  Medication Sig Dispense Refill  . albuterol (PROVENTIL) (2.5 MG/3ML) 0.083% nebulizer solution     . albuterol (VENTOLIN HFA) 108 (90 Base) MCG/ACT inhaler INHALE 2 PUFFS BY MOUTH EVERY 4-6 HRS AS NEEDED AS DIRECTED  54 g 5  . amLODipine (NORVASC) 5 MG tablet Take 1 tablet (5 mg total) by mouth in the morning and at bedtime. 180 tablet 3  . aspirin EC 81 MG tablet Take 1 tablet (81 mg total) by mouth daily. 90 tablet 3  . atorvastatin (LIPITOR) 40 MG tablet ONE TABLET BY MOUTH DAILY 90 tablet 2  . bumetanide (BUMEX) 2 MG tablet TAKE 1 TABLET BY MOUTH 2 TIMES A WEEK 24 tablet 3  . cyanocobalamin 1000 MCG tablet Take 1,000 mcg by mouth every Thursday.     . cyclobenzaprine (FLEXERIL) 10 MG tablet Take 10 mg by mouth at bedtime.     . diclofenac sodium (VOLTAREN) 1 % GEL APPLY 2 GRAM TO THE AFFECTED AREAS 4 TIMES A DAY AS NEEDED FOR KNEE PAIN  700 g 3  . diphenoxylate-atropine (LOMOTIL) 2.5-0.025 MG tablet Take 1 tablet by mouth 4 (four) times daily as needed for diarrhea or loose stools.     . docusate sodium (COLACE) 100 MG capsule Take 100 mg by mouth 2 (two) times daily as needed for mild constipation.    Marland Kitchen ezetimibe  (ZETIA) 10 MG tablet ONE TABLET BY MOUTH DAILY 90 tablet 3  . ferrous sulfate 325 (65 FE) MG tablet Take 325 mg by mouth 2 (two) times daily with a meal.     . fluticasone (FLONASE) 50 MCG/ACT nasal spray Place 1 spray into both nostrils daily as needed for allergies or rhinitis.    . Fluticasone-Umeclidin-Vilant (TRELEGY ELLIPTA) 100-62.5-25 MCG/INH AEPB Inhale 1 puff into the lungs daily. 60 each 6  . folic acid (FOLVITE) 1 MG tablet TAKE 1 TABLET BY MOUTH DAILY 90 tablet 3  . gabapentin (NEURONTIN) 300 MG capsule Take 300 mg by mouth in the morning, at noon, and at bedtime.    Marland Kitchen ibuprofen (ADVIL) 800 MG tablet ONE TABLET BY MOUTH THREE TIMES A DAY AS NEEDED  60 tablet 0  . losartan (COZAAR) 50 MG tablet TAKE 1 TABLET BY MOUTH DAILY 90 tablet 3  . metFORMIN (GLUCOPHAGE-XR) 500 MG 24 hr tablet     . montelukast (SINGULAIR) 10 MG tablet TAKE 1 TABLET BY MOUTH DAILY 90 tablet 3  . pantoprazole (PROTONIX) 40 MG tablet ONE TABLET BY MOUTH DAILY 90  tablet 2  . traZODone (DESYREL) 25 mg TABS tablet Take 25 mg by mouth at bedtime.    . Vitamin D, Ergocalciferol, (DRISDOL) 1.25 MG (50000 UNIT) CAPS capsule TAKE 1 CAPSULE BY MOUTH WEEKLY 12 capsule 0   No current facility-administered medications for this visit.    PHYSICAL EXAMINATION: ECOG PERFORMANCE STATUS: 2 - Symptomatic, <50% confined to bed  Vitals:   02/04/21 1252  BP: (!) 141/57  Pulse: 78  Resp: 16  Temp: 97.8 F (36.6 C)  SpO2: 92%   Filed Weights   02/04/21 1252  Weight: 217 lb 1.6 oz (98.5 kg)    Due to COVID19 we will limit examination to appearance. Patient had no complaints.  GENERAL:alert, no distress and comfortable SKIN: skin color normal, no rashes or significant lesions EYES: normal, Conjunctiva are pink and non-injected, sclera clear  NEURO: alert & oriented x 3 with fluent speech   LABORATORY DATA:  I have reviewed the data as listed CBC Latest Ref Rng & Units 02/04/2021 05/21/2020 04/09/2019  WBC 4.0 - 10.5  K/uL 5.0 7.4 6.0  Hemoglobin 12.0 - 15.0 g/dL 10.2(L) 9.8(L) 10.9(L)  Hematocrit 36.0 - 46.0 % 33.4(L) 32.5(L) 32.4(L)  Platelets 150 - 400 K/uL 258 320 281     CMP Latest Ref Rng & Units 02/04/2021 07/26/2020 05/21/2020  Glucose 70 - 99 mg/dL 409(W) 119(J) 478(G)  BUN 8 - 23 mg/dL Creatinine 0.44 - 1.00 mg/dL 9.56(O) 1.30(Q) 6.57(Q)  Sodium 135 - 145 mmol/L 145 143 142  Potassium 3.5 - 5.1 mmol/L 3.7 4.4 4.3  Chloride 98 - 111 mmol/L 103 100 103  CO2 22 - 32 mmol/L Calcium 8.9 - 10.3 mg/dL 9.4 9.5 9.7  Total Protein 6.5 - 8.1 g/dL 6.9 - 7.2  Total Bilirubin 0.3 - 1.2 mg/dL 0.3 - <4.6(N)  Alkaline Phos 38 - 126 U/L 104 - 87  AST 15 - 41 U/L 9(L) - 12(L)  ALT 0 - 44 U/L 8 - 12      RADIOGRAPHIC STUDIES: I have personally reviewed the radiological images as listed and agreed with the findings in the report. No results found.   ASSESSMENT & PLAN:  Anita Gonzalez is a 75 y.o. female with   1. Normocytic anemia secondary to iron deficiency, from GI AVM bleeding  -She has moderate anemia with baseline hemoglobin in 8-9 range, MCV normal, ferritin 16, serum iron 13, URBC elevated at 430, iron saturation 3%, this is consistent with iron deficient anemia -Anemia has been several years, likely secondary to slow GI bleeding. Her stool OB was positive. Colonoscopy showed AVM, last done in 03/2015. Her 03/15/18 Upper Endoscopy with Dr Elnoria Howard showed no evidence of bleeding.  -Her baseline SPEP and UPEP were negative.  -She also may have a component of anemia of chronic disease secondary to kidney disease and rheumatoid arthritis -She was treated with IV Feraheme 4 times in 2016 and  3 times in 2018. Last dose in 05/2020. She responded well and was to continue oral iron BID.  -She has intermittently lost follow up with labs. She continues to have progressively worse fatigue. She denies GI bleeding or overt blood loss. Labs reviewed, Hg 10.2, BG 130, Cr 1.17. iron panel still pending.  If her iron is low, I will give IV Ferahame -Continue oral B12, Folic acid, iron ferrous sulfate BID daily. Will set up iv feraheme if ferritin<100  -Given symptoms, will monitor with labs every 2 months  -  F/u in 4 months    2. Hypertension, diabetes, rheumatoid arthritis, asthma, CKD stage III, H/o Hep C (s/p treatment) -She will continue follow-up with her primary care physician and other specialists -Her CKD is secondary to her HTN and DM, she will continue to mange them.          -She has been on Ibuprofen for Rheumatoid Arthritis        -She f/u with Liver specialist and has US liver every 6 months, last in 2019   3. Insomnia  -Her increased fatigue is multifactorial including her trouble sleeping. I recommend she increase her Melatonin to 10-15mg .   Plan -Labs reviewed, ferritin 36, will schedule IV feraheme twice  -Continue oral iron BID, Folic Acid -Lab in 2 and 4 months, will set up iv feraheme if ferritin<100  -F/u in 4 months  -Proceed with liver US with liver specialist.    No problem-specific Assessment & Plan notes found for this encounter.   Orders Placed This Encounter  Procedures  . Vitamin B12    Standing Status:   Future    Standing Expiration Date:   02/04/2022  . Folate RBC    Standing Status:   Future    Standing Expiration Date:   02/04/2022   All questions were answered. The patient knows to call the clinic with any problems, questions or concerns. No barriers to learning was detected. The total time spent in the appointment was 20 minutes.     Malachy Mood, MD 02/04/2021   I, Delphina Cahill, am acting as scribe for Malachy Mood, MD.   I have reviewed the above documentation for accuracy and completeness, and I agree with the above.

## 2021-02-04 ENCOUNTER — Other Ambulatory Visit: Payer: Self-pay

## 2021-02-04 ENCOUNTER — Encounter: Payer: Self-pay | Admitting: Hematology

## 2021-02-04 ENCOUNTER — Inpatient Hospital Stay: Payer: Medicare HMO | Attending: Hematology | Admitting: Hematology

## 2021-02-04 ENCOUNTER — Inpatient Hospital Stay: Payer: Medicare HMO

## 2021-02-04 VITALS — BP 141/57 | HR 78 | Temp 97.8°F | Resp 16 | Ht 61.0 in | Wt 217.1 lb

## 2021-02-04 DIAGNOSIS — R6889 Other general symptoms and signs: Secondary | ICD-10-CM | POA: Diagnosis not present

## 2021-02-04 DIAGNOSIS — K922 Gastrointestinal hemorrhage, unspecified: Secondary | ICD-10-CM | POA: Diagnosis not present

## 2021-02-04 DIAGNOSIS — D500 Iron deficiency anemia secondary to blood loss (chronic): Secondary | ICD-10-CM

## 2021-02-04 DIAGNOSIS — G4700 Insomnia, unspecified: Secondary | ICD-10-CM | POA: Diagnosis not present

## 2021-02-04 DIAGNOSIS — D649 Anemia, unspecified: Secondary | ICD-10-CM

## 2021-02-04 LAB — CBC WITH DIFFERENTIAL (CANCER CENTER ONLY)
Abs Immature Granulocytes: 0.01 10*3/uL (ref 0.00–0.07)
Basophils Absolute: 0 10*3/uL (ref 0.0–0.1)
Basophils Relative: 0 %
Eosinophils Absolute: 0.2 10*3/uL (ref 0.0–0.5)
Eosinophils Relative: 3 %
HCT: 33.4 % — ABNORMAL LOW (ref 36.0–46.0)
Hemoglobin: 10.2 g/dL — ABNORMAL LOW (ref 12.0–15.0)
Immature Granulocytes: 0 %
Lymphocytes Relative: 28 %
Lymphs Abs: 1.4 10*3/uL (ref 0.7–4.0)
MCH: 29.1 pg (ref 26.0–34.0)
MCHC: 30.5 g/dL (ref 30.0–36.0)
MCV: 95.4 fL (ref 80.0–100.0)
Monocytes Absolute: 0.3 10*3/uL (ref 0.1–1.0)
Monocytes Relative: 6 %
Neutro Abs: 3.1 10*3/uL (ref 1.7–7.7)
Neutrophils Relative %: 63 %
Platelet Count: 258 10*3/uL (ref 150–400)
RBC: 3.5 MIL/uL — ABNORMAL LOW (ref 3.87–5.11)
RDW: 13.5 % (ref 11.5–15.5)
WBC Count: 5 10*3/uL (ref 4.0–10.5)
nRBC: 0 % (ref 0.0–0.2)

## 2021-02-04 LAB — CMP (CANCER CENTER ONLY)
ALT: 8 U/L (ref 0–44)
AST: 9 U/L — ABNORMAL LOW (ref 15–41)
Albumin: 3.6 g/dL (ref 3.5–5.0)
Alkaline Phosphatase: 104 U/L (ref 38–126)
Anion gap: 11 (ref 5–15)
BUN: 9 mg/dL (ref 8–23)
CO2: 31 mmol/L (ref 22–32)
Calcium: 9.4 mg/dL (ref 8.9–10.3)
Chloride: 103 mmol/L (ref 98–111)
Creatinine: 1.17 mg/dL — ABNORMAL HIGH (ref 0.44–1.00)
GFR, Estimated: 49 mL/min — ABNORMAL LOW (ref >60)
Glucose, Bld: 130 mg/dL — ABNORMAL HIGH (ref 70–99)
Potassium: 3.7 mmol/L (ref 3.5–5.1)
Sodium: 145 mmol/L (ref 135–145)
Total Bilirubin: 0.3 mg/dL (ref 0.3–1.2)
Total Protein: 6.9 g/dL (ref 6.5–8.1)

## 2021-02-04 LAB — IRON AND TIBC
Iron: 43 ug/dL (ref 41–142)
Saturation Ratios: 14 % — ABNORMAL LOW (ref 21–57)
TIBC: 305 ug/dL (ref 236–444)
UIBC: 262 ug/dL (ref 120–384)

## 2021-02-04 LAB — FERRITIN: Ferritin: 36 ng/mL (ref 11–307)

## 2021-02-07 ENCOUNTER — Telehealth: Payer: Self-pay | Admitting: Hematology

## 2021-02-07 ENCOUNTER — Telehealth: Payer: Self-pay | Admitting: Pulmonary Disease

## 2021-02-07 ENCOUNTER — Telehealth: Payer: Self-pay

## 2021-02-07 NOTE — Telephone Encounter (Signed)
Scheduled appts per 3/25 sch msg. Pt aware.  

## 2021-02-07 NOTE — Telephone Encounter (Signed)
-----   Message from Malachy Mood, MD sent at 02/07/2021  7:40 AM EDT ----- Please let pt know her iron level was low last week, please set up iv feraheme twice in next 2-4 weeks, thanks   Malachy Mood  02/07/2021

## 2021-02-07 NOTE — Telephone Encounter (Signed)
Called x 3 to make attempts at discussing with pt scheduling message sent per Dyanne Yorks request

## 2021-02-07 NOTE — Telephone Encounter (Signed)
Called and spoke with the pt  She states that [place] told her that they need some information from [place]  Sharyn Creamer and spoke with Archie Patten  Was advised that pt needs [place] to send new rx for her o2  Looks like she is using 2lpm with exertion only  Dr Francine Graven are you okay with sending new rx for her to continue her exertional o2?  Thanks

## 2021-02-07 NOTE — Telephone Encounter (Signed)
Call placed to home and cell phone with no answer. Home phone number in system is incorrect. No VM set up on cell phone. Message sent to scheduling to set up appts

## 2021-02-07 NOTE — Telephone Encounter (Signed)
-----   Message from Yan Feng, MD sent at 02/07/2021  7:40 AM EDT ----- Please let pt know her iron level was low last week, please set up iv feraheme twice in next 2-4 weeks, thanks   Yan Feng  02/07/2021  

## 2021-02-09 NOTE — Telephone Encounter (Signed)
Spoke with pt and notified her we are still waiting on ok to order O2 from Dr. Francine Graven. Dr. Francine Graven may we place a O2?

## 2021-02-10 NOTE — Telephone Encounter (Signed)
Called [employer] and spoke with Lawson Fiscal. She states the pt needs a new O2 order, not a CMN, and she needs to be re-qualified for her O2. Called and spoke to pt. Appt made for a qualifying walk for 02/15/21 and the O2 order can be placed at that visit as Dr. Francine Graven noted in last OV (3/17) that pt is on 2lpm with ambulation and did not advise to change anything. Will keep encounter open to assure walk is done and order is placed after walk test.

## 2021-02-14 ENCOUNTER — Other Ambulatory Visit: Payer: Self-pay | Admitting: Internal Medicine

## 2021-02-14 NOTE — Telephone Encounter (Signed)
Thank you. We will take care of this at her upcoming appointment.   Thanks again, [employer]

## 2021-02-15 ENCOUNTER — Ambulatory Visit: Payer: Medicare HMO

## 2021-02-15 NOTE — Telephone Encounter (Signed)
Patient checking on order for oxygen machine. Patient phone number is [phone].

## 2021-02-15 NOTE — Telephone Encounter (Signed)
Pt was scheduled for a qualifying walk today 4/5 at 11:30 and pt no showed for that appt. Pt needs to have a walk test done prior to [place] able to send an order to DME for her O2.  Called and spoke with pt about this and pt said that she had forgotten about the appt. Stated to pt that we needed to reschedule the appt prior to able to send order to DME and she verbalized understanding. appt rescheduled for pt 4/14. Nothing further needed.

## 2021-02-16 DIAGNOSIS — G4733 Obstructive sleep apnea (adult) (pediatric): Secondary | ICD-10-CM | POA: Diagnosis not present

## 2021-02-16 DIAGNOSIS — R1013 Epigastric pain: Secondary | ICD-10-CM | POA: Diagnosis not present

## 2021-02-16 DIAGNOSIS — E1129 Type 2 diabetes mellitus with other diabetic kidney complication: Secondary | ICD-10-CM | POA: Diagnosis not present

## 2021-02-16 DIAGNOSIS — I119 Hypertensive heart disease without heart failure: Secondary | ICD-10-CM | POA: Diagnosis not present

## 2021-02-16 DIAGNOSIS — G8929 Other chronic pain: Secondary | ICD-10-CM | POA: Diagnosis not present

## 2021-02-16 DIAGNOSIS — J45909 Unspecified asthma, uncomplicated: Secondary | ICD-10-CM | POA: Diagnosis not present

## 2021-02-16 DIAGNOSIS — E782 Mixed hyperlipidemia: Secondary | ICD-10-CM | POA: Diagnosis not present

## 2021-02-16 DIAGNOSIS — R6889 Other general symptoms and signs: Secondary | ICD-10-CM | POA: Diagnosis not present

## 2021-02-16 DIAGNOSIS — E559 Vitamin D deficiency, unspecified: Secondary | ICD-10-CM | POA: Diagnosis not present

## 2021-02-16 DIAGNOSIS — Z03818 Encounter for observation for suspected exposure to other biological agents ruled out: Secondary | ICD-10-CM | POA: Diagnosis not present

## 2021-02-16 DIAGNOSIS — R600 Localized edema: Secondary | ICD-10-CM | POA: Diagnosis not present

## 2021-02-17 DIAGNOSIS — M7989 Other specified soft tissue disorders: Secondary | ICD-10-CM | POA: Diagnosis not present

## 2021-02-17 DIAGNOSIS — I739 Peripheral vascular disease, unspecified: Secondary | ICD-10-CM | POA: Diagnosis not present

## 2021-02-17 DIAGNOSIS — L603 Nail dystrophy: Secondary | ICD-10-CM | POA: Diagnosis not present

## 2021-02-17 DIAGNOSIS — M722 Plantar fascial fibromatosis: Secondary | ICD-10-CM | POA: Diagnosis not present

## 2021-02-17 DIAGNOSIS — R6889 Other general symptoms and signs: Secondary | ICD-10-CM | POA: Diagnosis not present

## 2021-02-17 DIAGNOSIS — M7752 Other enthesopathy of left foot: Secondary | ICD-10-CM | POA: Diagnosis not present

## 2021-02-17 DIAGNOSIS — E1151 Type 2 diabetes mellitus with diabetic peripheral angiopathy without gangrene: Secondary | ICD-10-CM | POA: Diagnosis not present

## 2021-02-17 DIAGNOSIS — M7751 Other enthesopathy of right foot: Secondary | ICD-10-CM | POA: Diagnosis not present

## 2021-02-21 ENCOUNTER — Other Ambulatory Visit: Payer: Medicare HMO

## 2021-02-21 NOTE — Progress Notes (Signed)
Intravenous Iron Formulation Change  Anita Gonzalez has insurance that requires a change in intravenous iron product from Feraheme to Venofer. Orders have been updated to reflect this change and scheduling message sent to adjust infusion appointments. Dr Mosetta Putt notified and agrees with the plan.  Allergies:  Allergies  Allergen Reactions  . Fish Allergy Anaphylaxis, Hives and Swelling  . Peanuts [Peanut Oil] Anaphylaxis, Hives, Itching and Swelling  . Penicillins Hives, Itching, Swelling and Other (See Comments)    PATIENT HAS HAD A PCN REACTION WITH IMMEDIATE RASH, FACIAL/TONGUE/THROAT SWELLING, SOB, OR LIGHTHEADEDNESS WITH HYPOTENSION:  #  #  YES  #  #  HAS PT DEVELOPED SEVERE RASH INVOLVING MUCUS MEMBRANES or SKIN NECROSIS: #  #  YES  #  # PATIENT HAS HAD A PCN REACTION THAT REQUIRED HOSPITALIZATION:  #  #  YES  #  #  Has patient had a PCN reaction occurring within the last 10 years: No   . Iodinated Diagnostic Agents Hives, Itching and Swelling    SWELLING REACTION UNSPECIFIED   . Iodine Hives, Itching and Swelling    Patient reports receiving IV dye in abdominal CT scan without issue  . Percocet [Oxycodone-Acetaminophen] Nausea Only, Anxiety and Other (See Comments)    Raised blood pressure, Sweats, nervous,dizziness  . Hydrocodone Itching and Nausea Only  . Other Other (See Comments)    UNSPECIFIED REACTION  Hypersensitive to perfumes, colognes, powders, lotions, room sprays  . Oxycodone Itching, Nausea Only and Other (See Comments)    Sweats and dizziness  . Sulfa Antibiotics     UNSPECIFIED REACTION     The plan for iron therapy is as follows: Venofer 200 mg IVPB x 5 doses plus premedications due to extensive allergies.  [name] 02/21/2021

## 2021-02-23 ENCOUNTER — Other Ambulatory Visit: Payer: Self-pay

## 2021-02-23 ENCOUNTER — Inpatient Hospital Stay: Payer: Medicare HMO | Attending: Hematology

## 2021-02-23 VITALS — BP 134/62 | HR 71 | Temp 98.2°F | Resp 18

## 2021-02-23 DIAGNOSIS — Q2730 Arteriovenous malformation, site unspecified: Secondary | ICD-10-CM | POA: Diagnosis not present

## 2021-02-23 DIAGNOSIS — K922 Gastrointestinal hemorrhage, unspecified: Secondary | ICD-10-CM | POA: Insufficient documentation

## 2021-02-23 DIAGNOSIS — D500 Iron deficiency anemia secondary to blood loss (chronic): Secondary | ICD-10-CM

## 2021-02-23 MED ORDER — ACETAMINOPHEN 325 MG PO TABS
650.0000 mg | ORAL_TABLET | Freq: Once | ORAL | Status: AC
  Administered 2021-02-23: 650 mg via ORAL

## 2021-02-23 MED ORDER — SODIUM CHLORIDE 0.9 % IV SOLN
Freq: Once | INTRAVENOUS | Status: AC
  Filled 2021-02-23: qty 250

## 2021-02-23 MED ORDER — FAMOTIDINE 20 MG PO TABS
ORAL_TABLET | ORAL | Status: AC
  Filled 2021-02-23: qty 1

## 2021-02-23 MED ORDER — SODIUM CHLORIDE 0.9 % IV SOLN
200.0000 mg | Freq: Once | INTRAVENOUS | Status: AC
  Administered 2021-02-23: 200 mg via INTRAVENOUS
  Filled 2021-02-23: qty 200

## 2021-02-23 MED ORDER — FAMOTIDINE 20 MG PO TABS
20.0000 mg | ORAL_TABLET | Freq: Once | ORAL | Status: AC
  Administered 2021-02-23: 20 mg via ORAL

## 2021-02-23 MED ORDER — ACETAMINOPHEN 325 MG PO TABS
ORAL_TABLET | ORAL | Status: AC
  Filled 2021-02-23: qty 2

## 2021-02-23 MED ORDER — LORATADINE 10 MG PO TABS
10.0000 mg | ORAL_TABLET | Freq: Once | ORAL | Status: AC
  Administered 2021-02-23: 10 mg via ORAL

## 2021-02-23 MED ORDER — LORATADINE 10 MG PO TABS
ORAL_TABLET | ORAL | Status: AC
  Filled 2021-02-23: qty 1

## 2021-02-23 NOTE — Patient Instructions (Signed)

## 2021-02-24 ENCOUNTER — Ambulatory Visit: Payer: Medicare HMO

## 2021-02-24 VITALS — Temp 97.3°F | Ht 61.0 in | Wt 216.2 lb

## 2021-02-24 DIAGNOSIS — J4550 Severe persistent asthma, uncomplicated: Secondary | ICD-10-CM

## 2021-02-24 NOTE — Progress Notes (Signed)
Patient here for a qualifying walk, she walked 3 laps, sats down to 83% on RA, required 2L to keep sats >88%.  Orders sent to [facility] electronically.  Nothing further needed.

## 2021-03-01 DIAGNOSIS — E785 Hyperlipidemia, unspecified: Secondary | ICD-10-CM | POA: Diagnosis not present

## 2021-03-01 DIAGNOSIS — J449 Chronic obstructive pulmonary disease, unspecified: Secondary | ICD-10-CM | POA: Diagnosis not present

## 2021-03-01 DIAGNOSIS — N1831 Chronic kidney disease, stage 3a: Secondary | ICD-10-CM | POA: Diagnosis not present

## 2021-03-01 DIAGNOSIS — I2510 Atherosclerotic heart disease of native coronary artery without angina pectoris: Secondary | ICD-10-CM | POA: Diagnosis not present

## 2021-03-01 DIAGNOSIS — R6889 Other general symptoms and signs: Secondary | ICD-10-CM | POA: Diagnosis not present

## 2021-03-01 DIAGNOSIS — E1122 Type 2 diabetes mellitus with diabetic chronic kidney disease: Secondary | ICD-10-CM | POA: Diagnosis not present

## 2021-03-01 DIAGNOSIS — I129 Hypertensive chronic kidney disease with stage 1 through stage 4 chronic kidney disease, or unspecified chronic kidney disease: Secondary | ICD-10-CM | POA: Diagnosis not present

## 2021-03-02 ENCOUNTER — Other Ambulatory Visit: Payer: Self-pay

## 2021-03-02 ENCOUNTER — Inpatient Hospital Stay: Payer: Medicare HMO

## 2021-03-02 VITALS — BP 119/82 | HR 63 | Temp 98.0°F | Resp 18

## 2021-03-02 DIAGNOSIS — K922 Gastrointestinal hemorrhage, unspecified: Secondary | ICD-10-CM | POA: Diagnosis not present

## 2021-03-02 DIAGNOSIS — Q2730 Arteriovenous malformation, site unspecified: Secondary | ICD-10-CM | POA: Diagnosis not present

## 2021-03-02 DIAGNOSIS — D500 Iron deficiency anemia secondary to blood loss (chronic): Secondary | ICD-10-CM

## 2021-03-02 MED ORDER — LORATADINE 10 MG PO TABS
10.0000 mg | ORAL_TABLET | Freq: Once | ORAL | Status: AC
  Administered 2021-03-02: 10 mg via ORAL

## 2021-03-02 MED ORDER — ACETAMINOPHEN 325 MG PO TABS
ORAL_TABLET | ORAL | Status: AC
  Filled 2021-03-02: qty 2

## 2021-03-02 MED ORDER — ACETAMINOPHEN 325 MG PO TABS
650.0000 mg | ORAL_TABLET | Freq: Once | ORAL | Status: AC
  Administered 2021-03-02: 650 mg via ORAL

## 2021-03-02 MED ORDER — LORATADINE 10 MG PO TABS
ORAL_TABLET | ORAL | Status: AC
  Filled 2021-03-02: qty 1

## 2021-03-02 MED ORDER — FAMOTIDINE 20 MG PO TABS
ORAL_TABLET | ORAL | Status: AC
  Filled 2021-03-02: qty 1

## 2021-03-02 MED ORDER — SODIUM CHLORIDE 0.9 % IV SOLN
200.0000 mg | Freq: Once | INTRAVENOUS | Status: AC
  Administered 2021-03-02: 200 mg via INTRAVENOUS
  Filled 2021-03-02: qty 200

## 2021-03-02 MED ORDER — FAMOTIDINE 20 MG PO TABS
20.0000 mg | ORAL_TABLET | Freq: Once | ORAL | Status: AC
  Administered 2021-03-02: 20 mg via ORAL

## 2021-03-02 MED ORDER — SODIUM CHLORIDE 0.9 % IV SOLN
Freq: Once | INTRAVENOUS | Status: AC
  Filled 2021-03-02: qty 250

## 2021-03-02 NOTE — Patient Instructions (Signed)

## 2021-03-03 DIAGNOSIS — J449 Chronic obstructive pulmonary disease, unspecified: Secondary | ICD-10-CM | POA: Diagnosis not present

## 2021-03-07 ENCOUNTER — Telehealth: Payer: Self-pay | Admitting: Pulmonary Disease

## 2021-03-07 MED ORDER — TRELEGY ELLIPTA 100-62.5-25 MCG/INH IN AEPB: 1.0000 | INHALATION_SPRAY | Freq: Every day | RESPIRATORY_TRACT | 6 refills | Status: DC

## 2021-03-07 NOTE — Telephone Encounter (Signed)
Called and spoke with pt and she is aware of trelegy that has been sent to the [facility] on [address].  Pt stated that she has been having issues with the mail order pharmacy but she needs this medication now.

## 2021-03-09 ENCOUNTER — Other Ambulatory Visit: Payer: Self-pay

## 2021-03-09 ENCOUNTER — Inpatient Hospital Stay: Payer: Medicare HMO

## 2021-03-09 DIAGNOSIS — R6889 Other general symptoms and signs: Secondary | ICD-10-CM | POA: Diagnosis not present

## 2021-03-16 ENCOUNTER — Inpatient Hospital Stay: Payer: Medicare HMO | Attending: Hematology

## 2021-03-22 ENCOUNTER — Ambulatory Visit: Payer: Medicare HMO | Admitting: Internal Medicine

## 2021-03-22 DIAGNOSIS — R6889 Other general symptoms and signs: Secondary | ICD-10-CM | POA: Diagnosis not present

## 2021-03-28 ENCOUNTER — Ambulatory Visit: Payer: Medicare HMO | Admitting: Internal Medicine

## 2021-04-02 DIAGNOSIS — J449 Chronic obstructive pulmonary disease, unspecified: Secondary | ICD-10-CM | POA: Diagnosis not present

## 2021-04-05 DIAGNOSIS — R6889 Other general symptoms and signs: Secondary | ICD-10-CM | POA: Diagnosis not present

## 2021-04-07 ENCOUNTER — Inpatient Hospital Stay: Payer: Medicare HMO

## 2021-04-08 DIAGNOSIS — R6889 Other general symptoms and signs: Secondary | ICD-10-CM | POA: Diagnosis not present

## 2021-04-25 ENCOUNTER — Ambulatory Visit: Payer: Medicare HMO | Admitting: Pulmonary Disease

## 2021-04-26 ENCOUNTER — Other Ambulatory Visit: Payer: Self-pay | Admitting: Physician Assistant

## 2021-05-03 DIAGNOSIS — J449 Chronic obstructive pulmonary disease, unspecified: Secondary | ICD-10-CM | POA: Diagnosis not present

## 2021-05-06 ENCOUNTER — Ambulatory Visit: Payer: Medicare HMO | Admitting: Hematology

## 2021-05-06 ENCOUNTER — Other Ambulatory Visit: Payer: Medicare HMO

## 2021-05-16 ENCOUNTER — Other Ambulatory Visit: Payer: Self-pay | Admitting: Internal Medicine

## 2021-05-17 ENCOUNTER — Ambulatory Visit: Payer: Medicare HMO | Admitting: Pulmonary Disease

## 2021-05-26 ENCOUNTER — Other Ambulatory Visit: Payer: Self-pay | Admitting: Internal Medicine

## 2021-05-27 ENCOUNTER — Encounter: Payer: Self-pay | Admitting: Hematology

## 2021-06-03 ENCOUNTER — Inpatient Hospital Stay: Payer: Medicaid Other | Admitting: Hematology

## 2021-06-03 ENCOUNTER — Inpatient Hospital Stay: Payer: Medicaid Other | Attending: Hematology

## 2021-06-03 NOTE — Progress Notes (Deleted)
[facility]   [phone] [phone]   Clinic Follow up Note   Patient Care Team: [name], MD as PCP - General (Internal Medicine) Pricilla Riffle, MD as PCP - Cardiology (Cardiology) Jearld Lesch, MD as Referring Physician (Specialist) Jearld Lesch, MD as Referring Physician (Specialist) Jeani Hawking, MD as Consulting Physician (Gastroenterology) Clyde Lundborg., MD as Referring Physician (Anesthesiology) Larey Dresser, DPM as Consulting Physician (Podiatry)  Date of Service:  06/03/2021  CHIEF COMPLAINT: Follow-up iron deficient anemia  CURRENT THERAPY:  Oral iron Ferous sulfate BID, Folic Acid, oral B12 daily. IV Feraheme as needed, last in 05/2020.   INTERVAL HISTORY:  Anita Gonzalez is here for a follow up of IDA. She was last seen by me 8 months ago. She presents to the clinic alone. She notes she is fatigued. She notes she does more personal care but not much cooking or house care. She notes she is sitting or laying down most of the time. Her energy is overall worse than before. She denies bloody or black stool. She notes she is seeing ophthalmologist, GI, (Dr. Elnoria Howard) pulmonologist, PCP and Liver doctor for prior hep C treatment and follow up. I reviewed her medication list with her.  She notes her sleep has improved but still wakes up 4-5AM and able to return to sleep.    REVIEW OF SYSTEMS:   Constitutional: Denies fevers, chills or abnormal weight loss (+) increased fatigue  Eyes: Denies blurriness of vision Ears, nose, mouth, throat, and face: Denies mucositis or sore throat Respiratory: Denies cough, dyspnea or wheezes Cardiovascular: Denies palpitation, chest discomfort or lower extremity swelling Gastrointestinal:  Denies nausea, heartburn or change in bowel habits Skin: Denies abnormal skin rashes Lymphatics: Denies new lymphadenopathy or easy bruising Neurological:Denies numbness, tingling or  new weaknesses Behavioral/Psych: Mood is stable, no new changes  All other systems were reviewed with the patient and are negative.  MEDICAL HISTORY:  Past Medical History:  Diagnosis Date   Acute kidney injury (HCC) 01/11/2015   Alcohol abuse, in remission    Anemia    Anemia, iron deficiency 01/28/2015   Arthritis    Asthma    Bronchiectasis (HCC) 03/07/2016   Bronchitis    COPD (chronic obstructive pulmonary disease) (HCC)    Diabetes mellitus without complication (HCC)    type 2   DM II (diabetes mellitus, type II), controlled (HCC) 01/06/2015   Dyspnea and respiratory abnormality 04/06/2015   Dysrhythmia    hx of palpitations   E-coli UTI 01/11/2015   Essential hypertension 01/06/2015   GERD (gastroesophageal reflux disease)    Hay fever    Hepatitis    c treated with pills   HLD (hyperlipidemia) 01/06/2015   Hypertension    IBS (irritable bowel syndrome)    ILD (interstitial lung disease) (HCC) 12/11/2016   Leg cramps, sleep related 12/01/2015   Pneumonia    Rheumatoid arthritis (HCC) 1996   Sinus tachycardia 01/11/2015   LT monitor 01/2019: normal sinus rhythm, occ short bursts of PAT, accelerated junctional rhythm, no sig arrhythmias.     SURGICAL HISTORY: Past Surgical History:  Procedure Laterality Date   ABDOMINAL HYSTERECTOMY     BALLOON DILATION N/A 03/15/2018   Procedure: BALLOON DILATION;  Surgeon: Jeani Hawking, MD;  Location: [facility];  Service: Endoscopy;  Laterality: N/A;   CARPAL TUNNEL RELEASE Left 04/19/2018   Procedure: CARPAL TUNNEL RELEASE;  Surgeon: Coletta Memos, MD;  Location: [facility] OR;  Service: Neurosurgery;  Laterality: Left;  CARPAL TUNNEL RELEASE   CESAREAN SECTION     COLONOSCOPY WITH PROPOFOL N/A 04/02/2015   Procedure: COLONOSCOPY WITH PROPOFOL;  Surgeon: Jeani Hawking, MD;  Location: [facility];  Service: Endoscopy;  Laterality: N/A;   ESOPHAGOGASTRODUODENOSCOPY (EGD) WITH PROPOFOL N/A 04/02/2015   Procedure: ESOPHAGOGASTRODUODENOSCOPY (EGD)  WITH PROPOFOL;  Surgeon: Jeani Hawking, MD;  Location: [facility];  Service: Endoscopy;  Laterality: N/A;   ESOPHAGOGASTRODUODENOSCOPY (EGD) WITH PROPOFOL N/A 03/15/2018   Procedure: ESOPHAGOGASTRODUODENOSCOPY (EGD) WITH PROPOFOL;  Surgeon: Jeani Hawking, MD;  Location: [facility];  Service: Endoscopy;  Laterality: N/A;   HEMORRHOID SURGERY     HERNIA REPAIR     HERNIA REPAIR      I have reviewed the social history and family history with the patient and they are unchanged from previous note.  ALLERGIES:  is allergic to fish allergy, peanuts [peanut oil], penicillins, iodinated diagnostic agents, iodine, percocet [oxycodone-acetaminophen], hydrocodone, other, oxycodone, and sulfa antibiotics.  MEDICATIONS:  Current Outpatient Medications  Medication Sig Dispense Refill   albuterol (PROVENTIL) (2.5 MG/3ML) 0.083% nebulizer solution USE 1 UNIT DOSE(VIAL)VIA NEBULIZER 4 TIMES A DAY AS NEEDED AS DIRECTED 1080 mL 3   albuterol (VENTOLIN HFA) 108 (90 Base) MCG/ACT inhaler INHALE 2 PUFFS BY MOUTH EVERY 6 HRS AS NEEDED 54 g 10   amLODipine (NORVASC) 5 MG tablet TAKE 1 TABLET BY MOUTH IN THE MORNING AND AT BEDTIME 180 tablet 3   aspirin EC 81 MG tablet Take 1 tablet (81 mg total) by mouth daily. 90 tablet 3   atorvastatin (LIPITOR) 40 MG tablet ONE TABLET BY MOUTH DAILY 90 tablet 0   bumetanide (BUMEX) 2 MG tablet TAKE 1 TABLET BY MOUTH 2 TIMES A WEEK 24 tablet 3   cyanocobalamin 1000 MCG tablet Take 1,000 mcg by mouth every Thursday.      cyclobenzaprine (FLEXERIL) 10 MG tablet Take 10 mg by mouth at bedtime.      diclofenac sodium (VOLTAREN) 1 % GEL APPLY 2 GRAM TO THE AFFECTED AREAS 4 TIMES A DAY AS NEEDED FOR KNEE PAIN  700 g 3   diphenoxylate-atropine (LOMOTIL) 2.5-0.025 MG tablet Take 1 tablet by mouth 4 (four) times daily as needed for diarrhea or loose stools.      docusate sodium (COLACE) 100 MG capsule Take 100 mg by mouth 2 (two) times daily as needed for mild constipation.     ezetimibe  (ZETIA) 10 MG tablet ONE TABLET BY MOUTH DAILY 90 tablet 3   ferrous sulfate 325 (65 FE) MG tablet Take 325 mg by mouth 2 (two) times daily with a meal.      fluticasone (FLONASE) 50 MCG/ACT nasal spray Place 1 spray into both nostrils daily as needed for allergies or rhinitis.     Fluticasone-Umeclidin-Vilant (TRELEGY ELLIPTA) 100-62.5-25 MCG/INH AEPB Inhale 1 puff into the lungs daily. 60 each 6   folic acid (FOLVITE) 1 MG tablet TAKE 1 TABLET BY MOUTH DAILY 90 tablet 3   gabapentin (NEURONTIN) 300 MG capsule Take 300 mg by mouth in the morning, at noon, and at bedtime.     ibuprofen (ADVIL) 800 MG tablet ONE TABLET BY MOUTH THREE TIMES A DAY AS NEEDED  60 tablet 0   losartan (COZAAR) 50 MG tablet TAKE 1 TABLET BY MOUTH DAILY 90 tablet 3   metFORMIN (GLUCOPHAGE-XR) 500 MG 24 hr tablet      montelukast (SINGULAIR) 10 MG tablet TAKE 1 TABLET BY MOUTH DAILY 90 tablet 3   pantoprazole (PROTONIX) 40 MG  tablet ONE TABLET BY MOUTH DAILY 90 tablet 2   traZODone (DESYREL) 25 mg TABS tablet Take 25 mg by mouth at bedtime.     Vitamin D, Ergocalciferol, (DRISDOL) 1.25 MG (50000 UNIT) CAPS capsule TAKE 1 CAPSULE BY MOUTH WEEKLY 12 capsule 0   No current facility-administered medications for this visit.    PHYSICAL EXAMINATION: ECOG PERFORMANCE STATUS: 2 - Symptomatic, <50% confined to bed  There were no vitals filed for this visit.  There were no vitals filed for this visit.   Due to COVID19 we will limit examination to appearance. Patient had no complaints.  GENERAL:alert, no distress and comfortable SKIN: skin color normal, no rashes or significant lesions EYES: normal, Conjunctiva are pink and non-injected, sclera clear  NEURO: alert & oriented x 3 with fluent speech   LABORATORY DATA:  I have reviewed the data as listed CBC Latest Ref Rng & Units 02/04/2021 05/21/2020 04/09/2019  WBC 4.0 - 10.5 K/uL 5.0 7.4 6.0  Hemoglobin 12.0 - 15.0 g/dL 10.2(L) 9.8(L) 10.9(L)  Hematocrit 36.0 - 46.0 %  33.4(L) 32.5(L) 32.4(L)  Platelets 150 - 400 K/uL 258 320 281     CMP Latest Ref Rng & Units 02/04/2021 07/26/2020 05/21/2020  Glucose 70 - 99 mg/dL 330(Q) 762(U) 633(H)  BUN 8 - 23 mg/dL 9 18 19   Creatinine 0.44 - 1.00 mg/dL ) 5.45(G) 2.56(L)  Sodium 135 - 145 mmol/L 145 143 142  Potassium 3.5 - 5.1 mmol/L 3.7 4.4 4.3  Chloride 98 - 111 mmol/L 103 100 103  CO2 22 - 32 mmol/L 31 26 30   Calcium 8.9 - 10.3 mg/dL 9.4 9.5 9.7  Total Protein 6.5 - 8.1 g/dL 6.9 - 7.2  Total Bilirubin 0.3 - 1.2 mg/dL 0.3 - 8.93(T)  Alkaline Phos 38 - 126 U/L 104 - 87  AST 15 - 41 U/L 9(L) - 12(L)  ALT 0 - 44 U/L 8 - 12      RADIOGRAPHIC STUDIES: I have personally reviewed the radiological images as listed and agreed with the findings in the report. No results found.   ASSESSMENT & PLAN:  Jupiter Boys Vanauken is a 75 y.o. female with   1. Normocytic anemia secondary to iron deficiency, from GI AVM bleeding  -She has moderate anemia with baseline hemoglobin in 8-9 range, MCV normal, ferritin 16, serum iron 13, URBC elevated at 430, iron saturation 3%, this is consistent with iron deficient anemia -Anemia has been several years, likely secondary to slow GI bleeding. Her stool OB was positive. Colonoscopy showed AVM, last done in 03/2015. Her 03/15/18 Upper Endoscopy with Dr 04/2015 showed no evidence of bleeding.  -Her baseline SPEP and UPEP were negative.  -She also may have a component of anemia of chronic disease secondary to kidney disease and rheumatoid arthritis -She was treated with IV Feraheme 4 times in 2016 and  3 times in 2018. Last dose in 05/2020. She responded well and was to continue oral iron BID.  -She has intermittently lost follow up with labs. She continues to have progressively worse fatigue. She denies GI bleeding or overt blood loss. Labs reviewed, Hg 10.2, BG 130, Cr 1.17. iron panel still pending. If her iron is low, I will give IV Ferahame -Continue oral B12, Folic acid, iron ferrous sulfate  BID daily. Will set up iv feraheme if ferritin<100  -Given symptoms, will monitor with labs every 2 months  -F/u in 4 months     2. Hypertension, diabetes, rheumatoid arthritis, asthma, CKD stage III,  H/o Hep C (s/p treatment) -She will continue follow-up with her primary care physician and other specialists -Her CKD is secondary to her HTN and DM, she will continue to mange them.          -She has been on Ibuprofen for Rheumatoid Arthritis        -She f/u with Liver specialist and has US liver every 6 months, last in 2019    3. Insomnia  -Her increased fatigue is multifactorial including her trouble sleeping. I recommend she increase her Melatonin to 10-15mg .    Plan -Labs reviewed, ferritin 36, will schedule IV feraheme twice  -Continue oral iron BID, Folic Acid -Lab in 2 and 4 months, will set up iv feraheme if ferritin<100  -F/u in 4 months  -Proceed with liver US with liver specialist.    No problem-specific Assessment & Plan notes found for this encounter.   No orders of the defined types were placed in this encounter.  All questions were answered. The patient knows to call the clinic with any problems, questions or concerns. No barriers to learning was detected. The total time spent in the appointment was 20 minutes.     Malachy Mood, MD 06/03/2021   I, Delphina Cahill, am acting as scribe for Malachy Mood, MD.   I have reviewed the above documentation for accuracy and completeness, and I agree with the above.

## 2021-06-14 ENCOUNTER — Other Ambulatory Visit: Payer: Self-pay | Admitting: Physician Assistant

## 2021-06-24 DIAGNOSIS — M792 Neuralgia and neuritis, unspecified: Secondary | ICD-10-CM | POA: Diagnosis not present

## 2021-06-24 DIAGNOSIS — R6889 Other general symptoms and signs: Secondary | ICD-10-CM | POA: Diagnosis not present

## 2021-06-27 ENCOUNTER — Encounter: Payer: Self-pay | Admitting: Hematology

## 2021-06-28 NOTE — Progress Notes (Signed)
Cardiology Office Note    Date:  07/05/2021   ID:  Anita Gonzalez, DOB Apr 07, 1946, MRN [id number]   PCP:  [name], MD   [facility]  Cardiologist:  Dietrich Pates, MD   Advanced Practice Provider:  No care team member to display Electrophysiologist:  None   [phone]   Chief Complaint  Patient presents with   Follow-up     History of Present Illness:  Anita Gonzalez is a 75 y.o. female with history of coronary artery disease noted on CT 2017, normal Myoview 2018, hypertension, HLD, junctional bradycardia resolved with stopping beta-blockers, CKD, COPD, chronic hypoxic respiratory failure on O2 2L.  Patient last saw Tereso Newcomer, PA-C 07/26/2020 she was in a junctional rhythm rates of 70.  Verapamil was stopped and amlodipine was increased for better blood pressure control.  She was supposed to come back in a couple weeks for a repeat EKG but never did.  Echo was also ordered for shortness of breath and never done.  Patient comes in for f/u. Didn't come in with her O2-needs a portable tank. Has trouble with transportation. Patient denies chest pain, palpitations. Says her heart rate gets slow and times and gets a little lightheaded. Usually when she is walking or doing something. Doesn't check her pulse, just thinks its' going slow.  Past Medical History:  Diagnosis Date   Acute kidney injury (HCC) 01/11/2015   Alcohol abuse, in remission    Anemia    Anemia, iron deficiency 01/28/2015   Arthritis    Asthma    Bronchiectasis (HCC) 03/07/2016   Bronchitis    COPD (chronic obstructive pulmonary disease) (HCC)    Diabetes mellitus without complication (HCC)    type 2   DM II (diabetes mellitus, type II), controlled (HCC) 01/06/2015   Dyspnea and respiratory abnormality 04/06/2015   Dysrhythmia    hx of palpitations   E-coli UTI 01/11/2015   Essential hypertension 01/06/2015   GERD (gastroesophageal reflux disease)    Hay fever    Hepatitis    c  treated with pills   HLD (hyperlipidemia) 01/06/2015   Hypertension    IBS (irritable bowel syndrome)    ILD (interstitial lung disease) (HCC) 12/11/2016   Leg cramps, sleep related 12/01/2015   Pneumonia    Rheumatoid arthritis (HCC) 1996   Sinus tachycardia 01/11/2015   LT monitor 01/2019: normal sinus rhythm, occ short bursts of PAT, accelerated junctional rhythm, no sig arrhythmias.     Past Surgical History:  Procedure Laterality Date   ABDOMINAL HYSTERECTOMY     BALLOON DILATION N/A 03/15/2018   Procedure: BALLOON DILATION;  Surgeon: Jeani Hawking, MD;  Location: [facility];  Service: Endoscopy;  Laterality: N/A;   CARPAL TUNNEL RELEASE Left 04/19/2018   Procedure: CARPAL TUNNEL RELEASE;  Surgeon: Coletta Memos, MD;  Location: [facility] OR;  Service: Neurosurgery;  Laterality: Left;  CARPAL TUNNEL RELEASE   CESAREAN SECTION     COLONOSCOPY WITH PROPOFOL N/A 04/02/2015   Procedure: COLONOSCOPY WITH PROPOFOL;  Surgeon: Jeani Hawking, MD;  Location: [facility];  Service: Endoscopy;  Laterality: N/A;   ESOPHAGOGASTRODUODENOSCOPY (EGD) WITH PROPOFOL N/A 04/02/2015   Procedure: ESOPHAGOGASTRODUODENOSCOPY (EGD) WITH PROPOFOL;  Surgeon: Jeani Hawking, MD;  Location: [facility];  Service: Endoscopy;  Laterality: N/A;   ESOPHAGOGASTRODUODENOSCOPY (EGD) WITH PROPOFOL N/A 03/15/2018   Procedure: ESOPHAGOGASTRODUODENOSCOPY (EGD) WITH PROPOFOL;  Surgeon: Jeani Hawking, MD;  Location: [facility];  Service: Endoscopy;  Laterality: N/A;   HEMORRHOID SURGERY  HERNIA REPAIR     HERNIA REPAIR      Current Medications: Current Meds  Medication Sig   ADVAIR DISKUS 500-50 MCG/ACT AEPB INHALE 1 PUFF BY MOUTH 2 TIMES A DAY   albuterol (PROVENTIL) (2.5 MG/3ML) 0.083% nebulizer solution USE 1 UNIT DOSE(VIAL)VIA NEBULIZER 4 TIMES A DAY AS NEEDED AS DIRECTED   albuterol (VENTOLIN HFA) 108 (90 Base) MCG/ACT inhaler INHALE 2 PUFFS BY MOUTH EVERY 6 HRS AS NEEDED   amLODipine (NORVASC) 5 MG tablet TAKE 1 TABLET BY  MOUTH IN THE MORNING AND AT BEDTIME   aspirin EC 81 MG tablet Take 1 tablet (81 mg total) by mouth daily.   atorvastatin (LIPITOR) 40 MG tablet ONE TABLET BY MOUTH DAILY   bumetanide (BUMEX) 2 MG tablet TAKE 1 TABLET BY MOUTH 2 TIMES A WEEK   cyanocobalamin 1000 MCG tablet Take 1,000 mcg by mouth every Thursday.    cyclobenzaprine (FLEXERIL) 10 MG tablet Take 10 mg by mouth at bedtime.    diclofenac sodium (VOLTAREN) 1 % GEL APPLY 2 GRAM TO THE AFFECTED AREAS 4 TIMES A DAY AS NEEDED FOR KNEE PAIN    diphenoxylate-atropine (LOMOTIL) 2.5-0.025 MG tablet Take 1 tablet by mouth 4 (four) times daily as needed for diarrhea or loose stools.    docusate sodium (COLACE) 100 MG capsule Take 100 mg by mouth 2 (two) times daily as needed for mild constipation.   ezetimibe (ZETIA) 10 MG tablet ONE TABLET BY MOUTH DAILY   ferrous sulfate 325 (65 FE) MG tablet Take 325 mg by mouth 2 (two) times daily with a meal.    fluticasone (FLONASE) 50 MCG/ACT nasal spray Place 1 spray into both nostrils daily as needed for allergies or rhinitis.   folic acid (FOLVITE) 1 MG tablet TAKE 1 TABLET BY MOUTH DAILY   gabapentin (NEURONTIN) 600 MG tablet Take 600 mg by mouth 3 (three) times daily.   losartan (COZAAR) 50 MG tablet TAKE 1 TABLET BY MOUTH DAILY   metFORMIN (GLUCOPHAGE-XR) 500 MG 24 hr tablet Take 500 mg by mouth 2 (two) times daily.   montelukast (SINGULAIR) 10 MG tablet TAKE 1 TABLET BY MOUTH DAILY   pantoprazole (PROTONIX) 40 MG tablet ONE TABLET BY MOUTH DAILY   traZODone (DESYREL) 25 mg TABS tablet Take 25 mg by mouth at bedtime.   Vitamin D, Ergocalciferol, (DRISDOL) 1.25 MG (50000 UNIT) CAPS capsule TAKE 1 CAPSULE BY MOUTH WEEKLY     Allergies:   Fish allergy; Peanuts [peanut oil]; Penicillins; Iodinated diagnostic agents; Iodine; Percocet [oxycodone-acetaminophen]; 2,4-d dimethylamine (amisol); Peanut (diagnostic); Peanut allergen powder-dnfp; Hydrocodone; Other; Oxycodone; and Sulfa antibiotics   Social  History   Socioeconomic History   Marital status: Single    Spouse name: Not on file   Number of children: Not on file   Years of education: Not on file   Highest education level: Not on file  Occupational History   Occupation: retired  Tobacco Use   Smoking status: Former    Packs/day: 0.75    Years: 25.00    Pack years: 18.75    Types: Cigarettes    Quit date: 01/12/2011    Years since quitting: 10.4   Smokeless tobacco: Never  Vaping Use   Vaping Use: Never used  Substance and Sexual Activity   Alcohol use: Yes    Alcohol/week: 4.0 standard drinks    Types: 4 Cans of beer per week    Comment: heavy drinker in the past   Drug use: No  Sexual activity: Not Currently  Other Topics Concern   Not on file  Social History Narrative   Not on file   Social Determinants of Health   Financial Resource Strain: Not on file  Food Insecurity: Not on file  Transportation Needs: Not on file  Physical Activity: Not on file  Stress: Not on file  Social Connections: Not on file     Family History:  The patient's  family history includes Asthma in an other family member; COPD in an other family member; Diabetes in her mother; Heart disease in an other family member; Heart failure in her mother; Hypertension in an other family member; Stroke in an other family member; Throat cancer in her brother.   ROS:   Please see the history of present illness.    ROS All other systems reviewed and are negative.   PHYSICAL EXAM:   VS:  BP 134/64   Pulse 87   Ht  (1.549 m)   Wt 214 lb (97.1 kg)   SpO2 90%   BMI 40.43 kg/m   Physical Exam  GEN: Obese, in no acute distress  Neck: bilateral carotid bruits,no JVD, or masses Cardiac:RRR; 2/6 systolic murmur RSB Respiratory:  clear to auscultation bilaterally, normal work of breathing GI: soft, nontender, nondistended, + BS Ext: without cyanosis, clubbing, or edema, Good distal pulses bilaterally Neuro:  Alert and Oriented x 3, Psych:  euthymic mood, full affect  Wt Readings from Last 3 Encounters:  07/05/21 214 lb (97.1 kg)  02/24/21 216 lb 3.2 oz (98.1 kg)  02/04/21 217 lb 1.6 oz (98.5 kg)      Studies/Labs Reviewed:   EKG:  EKG is  ordered today.  The ekg ordered today demonstrates NSR incomplete RBBB, no acute change  Recent Labs: 07/26/2020: NT-Pro BNP 753 02/04/2021: ALT 8; BUN 9; Creatinine 1.17; Hemoglobin 10.2; Platelet Count 258; Potassium 3.7; Sodium 145   Lipid Panel    Component Value Date/Time   CHOL 131 04/09/2019 1530   TRIG 71 04/09/2019 1530   HDL 45 04/09/2019 1530   CHOLHDL 2.9 04/09/2019 1530   CHOLHDL 3 05/07/2015 1219   VLDL 15.8 05/07/2015 1219   LDLCALC 72 04/09/2019 1530    Additional studies/ records that were reviewed today include:  Cardiac monitor 09/2019 Sinus rhythm; heart rate 34-111 (average heart rate 66); occasional PAC, rare PVC; no pauses   Cardiac monitor 12/2018 Normal sinus rhythm, occ short bursts of PAT, accelerated junctional rhythm, no sig arrhythmias.   ABIs 05/24/2018 Normal    Myoview 01/31/2017 EF 66, soft tissue attenuation, no ischemia; low risk   24-hour Holter 08/2016 Sinus rhythm  33 to 121 bpm  Average HR 65 bpm  Occasional PVCs, PACs Symptoms of slow, fast HR or SOB did not correlate with arrhythmia    Carotid US 05/20/2015 Bilateral ICA 1-39   Echocardiogram 04/13/2015 EF 60-65, normal wall motion, grade 1 diastolic dysfunction, mild LAE     Risk Assessment/Calculations:         ASSESSMENT:    1. Junctional rhythm   2. Coronary artery disease involving native coronary artery of native heart without angina pectoris   3. Shortness of breath   4. Essential hypertension   5. Heart murmur   6. Bilateral carotid bruits      PLAN:  In order of problems listed above:  Junctional rhythm verapamil stopped 07/2020 was supposed to come back for follow-up EKG and never did-today's EKG shows normal sinus rhythm but patient says  she had feels  like her heart rate gets slow at times associated with lightheadedness with activity.  Will place 2-week monitor to rule out any type of arrhythmia.  CAD on CT 2017 normal Myoview 2018, no angina  COPD with chronic respiratory failure on home oxygen but does not have a travel tank so is not with her oxygen today  Hypertension blood pressure controlled  Systolic heart murmur check echo to rule out AS  Bilateral carotid bruits check carotid Dopplers.  We will provide her the number for [facility] [employer] ss her current Transportation Is unreliable and she has missed several appointments.    Shared Decision Making/Informed Consent        Medication Adjustments/Labs and Tests Ordered: Current medicines are reviewed at length with the patient today.  Concerns regarding medicines are outlined above.  Medication changes, Labs and Tests ordered today are listed in the Patient Instructions below. Patient Instructions  Medication Instructions:  Your physician recommends that you continue on your current medications as directed. Please refer to the Current Medication list given to you today.  *If you need a refill on your cardiac medications before your next appointment, please call your pharmacy*   Lab Work: NONE If you have labs (blood work) drawn today and your tests are completely normal, you will receive your results only by: MyChart Message (if you have MyChart) OR A paper copy in the mail If you have any lab test that is abnormal or we need to change your treatment, we will call you to review the results.   Testing/Procedures: Your physician has requested that you have a carotid duplex. This test is an ultrasound of the carotid arteries in your neck. It looks at blood flow through these arteries that supply the brain with blood. Allow one hour for this exam. There are no restrictions or special instructions.  Your physician has requested that you have an echocardiogram.  Echocardiography is a painless test that uses sound waves to create images of your heart. It provides your doctor with information about the size and shape of your heart and how well your heart's chambers and valves are working. This procedure takes approximately one hour. There are no restrictions for this procedure.   ZIO XT- Long Term Monitor Instructions  Your physician has requested you wear a ZIO patch monitor for 14 days.  This is a single patch monitor. [employer] supplies one patch monitor per enrollment. Additional stickers are not available. Please do not apply patch if you will be having a Nuclear Stress Test,  Echocardiogram, Cardiac CT, MRI, or Chest Xray during the period you would be wearing the  monitor. The patch cannot be worn during these tests. You cannot remove and re-apply the  ZIO XT patch monitor.  Your ZIO patch monitor will be mailed 3 day USPS to your address on file. It may take 3-5 days  to receive your monitor after you have been enrolled.  Once you have received your monitor, please review the enclosed instructions. Your monitor  has already been registered assigning a specific monitor serial # to you.  Billing and Patient Assistance Program Information  We have supplied [employer] with any of your insurance information on file for billing purposes. [employer] offers a sliding scale Patient Assistance Program for patients that do not have  insurance, or whose insurance does not completely cover the cost of the ZIO monitor.  You must apply for the Patient Assistance Program to qualify for this discounted rate.  To apply, please call [employer] at [phone], select option 4, select option 2, ask to apply for  Patient Assistance Program. Meredeth Ide will ask your household income, and how many people  are in your household. They will quote your out-of-pocket cost based on that information.  [employer] will also be able to set up a 61-month, interest-free payment plan if  needed.  Applying the monitor   Shave hair from upper left chest.  Hold abrader disc by orange tab. Rub abrader in 40 strokes over the upper left chest as  indicated in your monitor instructions.  Clean area with 4 enclosed alcohol pads. Let dry.  Apply patch as indicated in monitor instructions. Patch will be placed under collarbone on left  side of chest with arrow pointing upward.  Rub patch adhesive wings for 2 minutes. Remove white label marked "1". Remove the white  label marked "2". Rub patch adhesive wings for 2 additional minutes.  While looking in a mirror, press and release button in center of patch. A small green light will  flash 3-4 times. This will be your only indicator that the monitor has been turned on.  Do not shower for the first 24 hours. You may shower after the first 24 hours.  Press the button if you feel a symptom. You will hear a small click. Record Date, Time and  Symptom in the Patient Logbook.  When you are ready to remove the patch, follow instructions on the last 2 pages of Patient  Logbook. Stick patch monitor onto the last page of Patient Logbook.  Place Patient Logbook in the blue and white box. Use locking tab on box and tape box closed  securely. The blue and white box has prepaid postage on it. Please place it in the mailbox as  soon as possible. Your physician should have your test results approximately 7 days after the  monitor has been mailed back to [facility].  Call [facility] Customer Care at [phone] if you have questions regarding  your ZIO XT patch monitor. Call them immediately if you see an orange light blinking on your  monitor.  If your monitor falls off in less than 4 days, contact our Monitor department at [phone].  If your monitor becomes loose or falls off after 4 days call [employer] at [phone] for  suggestions on securing your monitor    Follow-Up: At [facility], you and your health needs are our  priority.  As part of our continuing mission to provide you with exceptional heart care, we have created designated Provider Care Teams.  These Care Teams include your primary Cardiologist (physician) and Advanced Practice Providers (APPs -  Physician Assistants and Nurse Practitioners) who all work together to provide you with the care you need, when you need it.  We recommend signing up for the patient portal called "MyChart".  Sign up information is provided on this After Visit Summary.  MyChart is used to connect with patients for Virtual Visits (Telemedicine).  Patients are able to view lab/test results, encounter notes, upcoming appointments, etc.  Non-urgent messages can be sent to your provider as well.   To learn more about what you can do with MyChart, go to [email or web address].    Your next appointment:   1 year(s)  The format for your next appointment:   In Person  Provider:   You may see Dietrich Pates, MD or one of the following Advanced Practice Providers on your designated Care Team:   Tereso Newcomer, [place]  Vin Bhagat, PA-C   Other Instructions Echocardiogram An echocardiogram is a test that uses sound waves (ultrasound) to produce images of the heart. Images from an echocardiogram can provide important information about: Heart size and shape. The size and thickness and movement of your heart's walls. Heart muscle function and strength. Heart valve function or if you have stenosis. Stenosis is when the heart valves are too narrow. If blood is flowing backward through the heart valves (regurgitation). A tumor or infectious growth around the heart valves. Areas of heart muscle that are not working well because of poor blood flow or injury from a heart attack. Aneurysm detection. An aneurysm is a weak or damaged part of an artery wall. The wall bulges out from the normal force of blood pumping through the body. Tell a health care provider about: Any allergies you have. All  medicines you are taking, including vitamins, herbs, eye drops, creams, and over-the-counter medicines. Any blood disorders you have. Any surgeries you have had. Any medical conditions you have. Whether you are pregnant or may be pregnant. What are the risks? Generally, this is a safe test. However, problems may occur, including an allergic reaction to dye (contrast) that may be used during the test. What happens before the test? No specific preparation is needed. You may eat and drink normally. What happens during the test?  You will take off your clothes from the waist up and put on a hospital gown. Electrodes or electrocardiogram (ECG)patches may be placed on your chest. The electrodes or patches are then connected to a device that monitors your heart rate and rhythm. You will lie down on a table for an ultrasound exam. A gel will be applied to your chest to help sound waves pass through your skin. A handheld device, called a transducer, will be pressed against your chest and moved over your heart. The transducer produces sound waves that travel to your heart and bounce back (or "echo" back) to the transducer. These sound waves will be captured in real-time and changed into images of your heart that can be viewed on a video monitor. The images will be recorded on a computer and reviewed by your health care provider. You may be asked to change positions or hold your breath for a short time. This makes it easier to get different views or better views of your heart. In some cases, you may receive contrast through an IV in one of your veins. This can improve the quality of the pictures from your heart. The procedure may vary among health care providers and hospitals. What can I expect after the test? You may return to your normal, everyday life, including diet, activities, andmedicines, unless your health care provider tells you not to do that. Follow these instructions at home: It is up to you  to get the results of your test. Ask your health care provider, or the department that is doing the test, when your results will be ready. Keep all follow-up visits. This is important. Summary An echocardiogram is a test that uses sound waves (ultrasound) to produce images of the heart. Images from an echocardiogram can provide important information about the size and shape of your heart, heart muscle function, heart valve function, and other possible heart problems. You do not need to do anything to prepare before this test. You may eat and drink normally. After the echocardiogram is completed, you may return to your normal, everyday life, unless your health care provider tells you not  to do that. Document Revised: 06/22/2020 Document Reviewed: 06/22/2020 Elsevier Patient Education  [address]      Signed, Jacolyn Reedy, [place]  07/05/2021 11:50 AM    [facility] [address] [place], [place], [place]  [address] Phone: [phone]; Fax: [phone]

## 2021-06-30 ENCOUNTER — Other Ambulatory Visit: Payer: Self-pay | Admitting: Internal Medicine

## 2021-07-03 DIAGNOSIS — J449 Chronic obstructive pulmonary disease, unspecified: Secondary | ICD-10-CM | POA: Diagnosis not present

## 2021-07-05 ENCOUNTER — Ambulatory Visit (INDEPENDENT_AMBULATORY_CARE_PROVIDER_SITE_OTHER): Payer: Medicare HMO

## 2021-07-05 ENCOUNTER — Ambulatory Visit (INDEPENDENT_AMBULATORY_CARE_PROVIDER_SITE_OTHER): Payer: Medicare HMO | Admitting: Physician Assistant

## 2021-07-05 ENCOUNTER — Other Ambulatory Visit: Payer: Self-pay

## 2021-07-05 ENCOUNTER — Encounter: Payer: Self-pay | Admitting: Physician Assistant

## 2021-07-05 VITALS — BP 134/64 | HR 87 | Ht 61.0 in | Wt 214.0 lb

## 2021-07-05 DIAGNOSIS — R0602 Shortness of breath: Secondary | ICD-10-CM | POA: Diagnosis not present

## 2021-07-05 DIAGNOSIS — I10 Essential (primary) hypertension: Secondary | ICD-10-CM | POA: Diagnosis not present

## 2021-07-05 DIAGNOSIS — I2510 Atherosclerotic heart disease of native coronary artery without angina pectoris: Secondary | ICD-10-CM

## 2021-07-05 DIAGNOSIS — R0989 Other specified symptoms and signs involving the circulatory and respiratory systems: Secondary | ICD-10-CM | POA: Diagnosis not present

## 2021-07-05 DIAGNOSIS — J9610 Chronic respiratory failure, unspecified whether with hypoxia or hypercapnia: Secondary | ICD-10-CM | POA: Diagnosis not present

## 2021-07-05 DIAGNOSIS — R6889 Other general symptoms and signs: Secondary | ICD-10-CM | POA: Diagnosis not present

## 2021-07-05 DIAGNOSIS — I498 Other specified cardiac arrhythmias: Secondary | ICD-10-CM | POA: Diagnosis not present

## 2021-07-05 DIAGNOSIS — R011 Cardiac murmur, unspecified: Secondary | ICD-10-CM | POA: Diagnosis not present

## 2021-07-05 NOTE — Progress Notes (Unsigned)
Patient enrolled for 14 day ZIO XT monitor to be mailed to her address on file.

## 2021-07-05 NOTE — Patient Instructions (Signed)
Medication Instructions:  Your physician recommends that you continue on your current medications as directed. Please refer to the Current Medication list given to you today.  *If you need a refill on your cardiac medications before your next appointment, please call your pharmacy*   Lab Work: NONE If you have labs (blood work) drawn today and your tests are completely normal, you will receive your results only by: MyChart Message (if you have MyChart) OR A paper copy in the mail If you have any lab test that is abnormal or we need to change your treatment, we will call you to review the results.   Testing/Procedures: Your physician has requested that you have a carotid duplex. This test is an ultrasound of the carotid arteries in your neck. It looks at blood flow through these arteries that supply the brain with blood. Allow one hour for this exam. There are no restrictions or special instructions.  Your physician has requested that you have an echocardiogram. Echocardiography is a painless test that uses sound waves to create images of your heart. It provides your doctor with information about the size and shape of your heart and how well your heart's chambers and valves are working. This procedure takes approximately one hour. There are no restrictions for this procedure.   ZIO XT- Long Term Monitor Instructions  Your physician has requested you wear a ZIO patch monitor for 14 days.  This is a single patch monitor. [employer] supplies one patch monitor per enrollment. Additional stickers are not available. Please do not apply patch if you will be having a Nuclear Stress Test,  Echocardiogram, Cardiac CT, MRI, or Chest Xray during the period you would be wearing the  monitor. The patch cannot be worn during these tests. You cannot remove and re-apply the  ZIO XT patch monitor.  Your ZIO patch monitor will be mailed 3 day USPS to your address on file. It may take 3-5 days  to receive your  monitor after you have been enrolled.  Once you have received your monitor, please review the enclosed instructions. Your monitor  has already been registered assigning a specific monitor serial # to you.  Billing and Patient Assistance Program Information  We have supplied [employer] with any of your insurance information on file for billing purposes. [employer] offers a sliding scale Patient Assistance Program for patients that do not have  insurance, or whose insurance does not completely cover the cost of the ZIO monitor.  You must apply for the Patient Assistance Program to qualify for this discounted rate.  To apply, please call [employer] at [phone], select option 4, select option 2, ask to apply for  Patient Assistance Program. Meredeth Ide will ask your household income, and how many people  are in your household. They will quote your out-of-pocket cost based on that information.  [employer] will also be able to set up a 10-month, interest-free payment plan if needed.  Applying the monitor   Shave hair from upper left chest.  Hold abrader disc by orange tab. Rub abrader in 40 strokes over the upper left chest as  indicated in your monitor instructions.  Clean area with 4 enclosed alcohol pads. Let dry.  Apply patch as indicated in monitor instructions. Patch will be placed under collarbone on left  side of chest with arrow pointing upward.  Rub patch adhesive wings for 2 minutes. Remove white label marked "1". Remove the white  label marked "2". Rub patch adhesive wings for 2 additional minutes.  While looking in a mirror, press and release button in center of patch. A small green light will  flash 3-4 times. This will be your only indicator that the monitor has been turned on.  Do not shower for the first 24 hours. You may shower after the first 24 hours.  Press the button if you feel a symptom. You will hear a small click. Record Date, Time and  Symptom in the Patient Logbook.  When you  are ready to remove the patch, follow instructions on the last 2 pages of Patient  Logbook. Stick patch monitor onto the last page of Patient Logbook.  Place Patient Logbook in the blue and white box. Use locking tab on box and tape box closed  securely. The blue and white box has prepaid postage on it. Please place it in the mailbox as  soon as possible. Your physician should have your test results approximately 7 days after the  monitor has been mailed back to [facility].  Call [facility] Customer Care at [phone] if you have questions regarding  your ZIO XT patch monitor. Call them immediately if you see an orange light blinking on your  monitor.  If your monitor falls off in less than 4 days, contact our Monitor department at [phone].  If your monitor becomes loose or falls off after 4 days call [employer] at [phone] for  suggestions on securing your monitor    Follow-Up: At [facility], you and your health needs are our priority.  As part of our continuing mission to provide you with exceptional heart care, we have created designated Provider Care Teams.  These Care Teams include your primary Cardiologist (physician) and Advanced Practice Providers (APPs -  Physician Assistants and Nurse Practitioners) who all work together to provide you with the care you need, when you need it.  We recommend signing up for the patient portal called "MyChart".  Sign up information is provided on this After Visit Summary.  MyChart is used to connect with patients for Virtual Visits (Telemedicine).  Patients are able to view lab/test results, encounter notes, upcoming appointments, etc.  Non-urgent messages can be sent to your provider as well.   To learn more about what you can do with MyChart, go to [email or web address].    Your next appointment:   1 year(s)  The format for your next appointment:   In Person  Provider:   You may see Dietrich Pates, MD or one of the following  Advanced Practice Providers on your designated Care Team:   Tereso Newcomer, PA-C [place], [place]   Other Instructions Echocardiogram An echocardiogram is a test that uses sound waves (ultrasound) to produce images of the heart. Images from an echocardiogram can provide important information about: Heart size and shape. The size and thickness and movement of your heart's walls. Heart muscle function and strength. Heart valve function or if you have stenosis. Stenosis is when the heart valves are too narrow. If blood is flowing backward through the heart valves (regurgitation). A tumor or infectious growth around the heart valves. Areas of heart muscle that are not working well because of poor blood flow or injury from a heart attack. Aneurysm detection. An aneurysm is a weak or damaged part of an artery wall. The wall bulges out from the normal force of blood pumping through the body. Tell a health care provider about: Any allergies you have. All medicines you are taking, including vitamins, herbs, eye drops, creams, and over-the-counter medicines. Any  blood disorders you have. Any surgeries you have had. Any medical conditions you have. Whether you are pregnant or may be pregnant. What are the risks? Generally, this is a safe test. However, problems may occur, including an allergic reaction to dye (contrast) that may be used during the test. What happens before the test? No specific preparation is needed. You may eat and drink normally. What happens during the test?  You will take off your clothes from the waist up and put on a hospital gown. Electrodes or electrocardiogram (ECG)patches may be placed on your chest. The electrodes or patches are then connected to a device that monitors your heart rate and rhythm. You will lie down on a table for an ultrasound exam. A gel will be applied to your chest to help sound waves pass through your skin. A handheld device, called a transducer,  will be pressed against your chest and moved over your heart. The transducer produces sound waves that travel to your heart and bounce back (or "echo" back) to the transducer. These sound waves will be captured in real-time and changed into images of your heart that can be viewed on a video monitor. The images will be recorded on a computer and reviewed by your health care provider. You may be asked to change positions or hold your breath for a short time. This makes it easier to get different views or better views of your heart. In some cases, you may receive contrast through an IV in one of your veins. This can improve the quality of the pictures from your heart. The procedure may vary among health care providers and hospitals. What can I expect after the test? You may return to your normal, everyday life, including diet, activities, andmedicines, unless your health care provider tells you not to do that. Follow these instructions at home: It is up to you to get the results of your test. Ask your health care provider, or the department that is doing the test, when your results will be ready. Keep all follow-up visits. This is important. Summary An echocardiogram is a test that uses sound waves (ultrasound) to produce images of the heart. Images from an echocardiogram can provide important information about the size and shape of your heart, heart muscle function, heart valve function, and other possible heart problems. You do not need to do anything to prepare before this test. You may eat and drink normally. After the echocardiogram is completed, you may return to your normal, everyday life, unless your health care provider tells you not to do that. Document Revised: 06/22/2020 Document Reviewed: 06/22/2020 Elsevier Patient Education  2022 [employer].

## 2021-07-07 ENCOUNTER — Ambulatory Visit: Payer: Medicaid Other | Admitting: Pulmonary Disease

## 2021-07-07 ENCOUNTER — Telehealth: Payer: Self-pay | Admitting: Internal Medicine

## 2021-07-07 NOTE — Telephone Encounter (Signed)
Pt called [facility] and was advised that she needs an referral from [facility]. Please advise pt further

## 2021-07-07 NOTE — Telephone Encounter (Signed)
Called [facility] at [phone].  They will give her a call to set up transportation services with her for upcoming appointments.

## 2021-07-12 DIAGNOSIS — I498 Other specified cardiac arrhythmias: Secondary | ICD-10-CM

## 2021-07-12 DIAGNOSIS — R011 Cardiac murmur, unspecified: Secondary | ICD-10-CM | POA: Diagnosis not present

## 2021-07-12 DIAGNOSIS — R0602 Shortness of breath: Secondary | ICD-10-CM

## 2021-07-14 ENCOUNTER — Other Ambulatory Visit: Payer: Self-pay

## 2021-07-14 ENCOUNTER — Encounter: Payer: Self-pay | Admitting: Hematology

## 2021-07-14 ENCOUNTER — Ambulatory Visit (HOSPITAL_COMMUNITY)
Admission: RE | Admit: 2021-07-14 | Discharge: 2021-07-14 | Disposition: A | Discharge status: Home / Self Care | Payer: Medicare HMO | Source: Ambulatory Visit | Attending: Internal Medicine | Admitting: Internal Medicine

## 2021-07-14 DIAGNOSIS — R0989 Other specified symptoms and signs involving the circulatory and respiratory systems: Secondary | ICD-10-CM | POA: Diagnosis not present

## 2021-07-20 DIAGNOSIS — I119 Hypertensive heart disease without heart failure: Secondary | ICD-10-CM | POA: Diagnosis not present

## 2021-07-20 DIAGNOSIS — J4540 Moderate persistent asthma, uncomplicated: Secondary | ICD-10-CM | POA: Diagnosis not present

## 2021-07-20 DIAGNOSIS — E782 Mixed hyperlipidemia: Secondary | ICD-10-CM | POA: Diagnosis not present

## 2021-07-20 DIAGNOSIS — G63 Polyneuropathy in diseases classified elsewhere: Secondary | ICD-10-CM | POA: Diagnosis not present

## 2021-07-20 DIAGNOSIS — R6889 Other general symptoms and signs: Secondary | ICD-10-CM | POA: Diagnosis not present

## 2021-07-20 DIAGNOSIS — Z0001 Encounter for general adult medical examination with abnormal findings: Secondary | ICD-10-CM | POA: Diagnosis not present

## 2021-07-20 DIAGNOSIS — E559 Vitamin D deficiency, unspecified: Secondary | ICD-10-CM | POA: Diagnosis not present

## 2021-07-20 DIAGNOSIS — G8929 Other chronic pain: Secondary | ICD-10-CM | POA: Diagnosis not present

## 2021-07-25 ENCOUNTER — Ambulatory Visit: Payer: Medicaid Other | Admitting: Pulmonary Disease

## 2021-07-25 NOTE — Progress Notes (Deleted)
Synopsis: Returns to clinic to re-establish care  Subjective:   PATIENT ID: Anita Gonzalez GENDER: female DOB: 1946-02-04, MRN: [id number]  HPI  No chief complaint on file.  Anita Gonzalez is a 75 year old woman, former smoker with chronic hypoxemic respiratory failure on 2L O2 with ambulation, asthma and obstructive sleep apnea who returns to pulmonary clinic for follow up.    She was transitioned from symbicort to trelegy at last visit with improvement in her breathing and cough. She does report a tickle in her throat of recent which she thinks is related to spring time allergies.   She was diagnosed with sleep apnea in the past and has not been on CPAP as she did not tolerate it previously.   OV 01/27/21: She continues to experience exertional dyspnea and is using 2L while ambulating. She complains of productive cough during the day but it does not disturb her sleep. She does have sinus congestion and pressure which she is using flonase and saline nasal spray. She has been using symbicort and as needed albuterol nebs for her shortness of breath. She does complain of wheezing as well.  PFTs from 2017 show FEV1/FVC 89, FEV1 1.03L (63%), FVC 1.16L (55%), TLC 2.84 (61%), DLCO 68% consistent with a mild restrictive defect and mild diffusion defect.   HR CT Chest in 2017 showed scattered areas of mild cylindrical bronchiectasis and associated thickening of the peribronchovascular interstitium. Inspiratory and expiratory imaging demonstrates air trapping. There is severe collapse of the trachea and mild collapse of the mainstem bronchi on expiratory phase indicative of tracheobronchomalacia.    Past Medical History:  Diagnosis Date   Acute kidney injury (HCC) 01/11/2015   Alcohol abuse, in remission    Anemia    Anemia, iron deficiency 01/28/2015   Arthritis    Asthma    Bronchiectasis (HCC) 03/07/2016   Bronchitis    COPD (chronic obstructive pulmonary disease) (HCC)    Diabetes mellitus  without complication (HCC)    type 2   DM II (diabetes mellitus, type II), controlled (HCC) 01/06/2015   Dyspnea and respiratory abnormality 04/06/2015   Dysrhythmia    hx of palpitations   E-coli UTI 01/11/2015   Essential hypertension 01/06/2015   GERD (gastroesophageal reflux disease)    Hay fever    Hepatitis    c treated with pills   HLD (hyperlipidemia) 01/06/2015   Hypertension    IBS (irritable bowel syndrome)    ILD (interstitial lung disease) (HCC) 12/11/2016   Leg cramps, sleep related 12/01/2015   Pneumonia    Rheumatoid arthritis (HCC) 1996   Sinus tachycardia 01/11/2015   LT monitor 01/2019: normal sinus rhythm, occ short bursts of PAT, accelerated junctional rhythm, no sig arrhythmias.      Family History  Problem Relation Age of Onset   Diabetes Mother    Heart failure Mother    Throat cancer Brother        throat cancer    Asthma Other    COPD Other    Hypertension Other    Stroke Other    Heart disease Other      Social History   Socioeconomic History   Marital status: Single    Spouse name: Not on file   Number of children: Not on file   Years of education: Not on file   Highest education level: Not on file  Occupational History   Occupation: retired  Tobacco Use   Smoking status: Former    Packs/day:  0.75    Years: 25.00    Pack years: 18.75    Types: Cigarettes    Quit date: 01/12/2011    Years since quitting: 10.5   Smokeless tobacco: Never  Vaping Use   Vaping Use: Never used  Substance and Sexual Activity   Alcohol use: Yes    Alcohol/week: 4.0 standard drinks    Types: 4 Cans of beer per week    Comment: heavy drinker in the past   Drug use: No   Sexual activity: Not Currently  Other Topics Concern   Not on file  Social History Narrative   Not on file   Social Determinants of Health   Financial Resource Strain: Not on file  Food Insecurity: Not on file  Transportation Needs: Not on file  Physical Activity: Not on file  Stress:  Not on file  Social Connections: Not on file  Intimate Partner Violence: Not on file     Allergies  Allergen Reactions   Fish Allergy Anaphylaxis, Hives, Swelling and Other (See Comments)   Peanuts [Peanut Oil] Anaphylaxis, Hives, Itching and Swelling   Penicillins Hives, Itching, Swelling and Other (See Comments)    PATIENT HAS HAD A PCN REACTION WITH IMMEDIATE RASH, FACIAL/TONGUE/THROAT SWELLING, SOB, OR LIGHTHEADEDNESS WITH HYPOTENSION:  #  #  YES  #  #  HAS PT DEVELOPED SEVERE RASH INVOLVING MUCUS MEMBRANES or SKIN NECROSIS: #  #  YES  #  # PATIENT HAS HAD A PCN REACTION THAT REQUIRED HOSPITALIZATION:  #  #  YES  #  #  Has patient had a PCN reaction occurring within the last 10 years: No    Iodinated Diagnostic Agents Hives, Itching and Swelling    SWELLING REACTION UNSPECIFIED    Iodine Hives, Itching and Swelling    Patient reports receiving IV dye in abdominal CT scan without issue   Percocet [Oxycodone-Acetaminophen] Nausea Only, Anxiety and Other (See Comments)    Raised blood pressure, Sweats, nervous,dizziness   2,4-D Dimethylamine (Amisol) Other (See Comments)   Peanut (Diagnostic) Other (See Comments)   Peanut Allergen Powder-Dnfp    Hydrocodone Itching and Nausea Only   Other Other (See Comments)    UNSPECIFIED REACTION  Hypersensitive to perfumes, colognes, powders, lotions, room sprays   Oxycodone Itching, Nausea Only and Other (See Comments)    Sweats and dizziness   Sulfa Antibiotics     UNSPECIFIED REACTION      Outpatient Medications Prior to Visit  Medication Sig Dispense Refill   ADVAIR DISKUS 500-50 MCG/ACT AEPB INHALE 1 PUFF BY MOUTH 2 TIMES A DAY 60 each 10   albuterol (PROVENTIL) (2.5 MG/3ML) 0.083% nebulizer solution USE 1 UNIT DOSE(VIAL)VIA NEBULIZER 4 TIMES A DAY AS NEEDED AS DIRECTED 1080 mL 3   albuterol (VENTOLIN HFA) 108 (90 Base) MCG/ACT inhaler INHALE 2 PUFFS BY MOUTH EVERY 6 HRS AS NEEDED 54 g 10   amLODipine (NORVASC) 5 MG tablet TAKE 1  TABLET BY MOUTH IN THE MORNING AND AT BEDTIME 180 tablet 3   aspirin EC 81 MG tablet Take 1 tablet (81 mg total) by mouth daily. 90 tablet 3   atorvastatin (LIPITOR) 40 MG tablet ONE TABLET BY MOUTH DAILY 90 tablet 0   bumetanide (BUMEX) 2 MG tablet TAKE 1 TABLET BY MOUTH 2 TIMES A WEEK 24 tablet 3   cyanocobalamin 1000 MCG tablet Take 1,000 mcg by mouth every Thursday.      cyclobenzaprine (FLEXERIL) 10 MG tablet Take 10 mg by mouth at bedtime.  diclofenac sodium (VOLTAREN) 1 % GEL APPLY 2 GRAM TO THE AFFECTED AREAS 4 TIMES A DAY AS NEEDED FOR KNEE PAIN  700 g 3   diphenoxylate-atropine (LOMOTIL) 2.5-0.025 MG tablet Take 1 tablet by mouth 4 (four) times daily as needed for diarrhea or loose stools.      docusate sodium (COLACE) 100 MG capsule Take 100 mg by mouth 2 (two) times daily as needed for mild constipation.     ezetimibe (ZETIA) 10 MG tablet ONE TABLET BY MOUTH DAILY 90 tablet 3   ferrous sulfate 325 (65 FE) MG tablet Take 325 mg by mouth 2 (two) times daily with a meal.      fluticasone (FLONASE) 50 MCG/ACT nasal spray Place 1 spray into both nostrils daily as needed for allergies or rhinitis.     folic acid (FOLVITE) 1 MG tablet TAKE 1 TABLET BY MOUTH DAILY 90 tablet 3   gabapentin (NEURONTIN) 600 MG tablet Take 600 mg by mouth 3 (three) times daily.     losartan (COZAAR) 50 MG tablet TAKE 1 TABLET BY MOUTH DAILY 90 tablet 3   metFORMIN (GLUCOPHAGE-XR) 500 MG 24 hr tablet Take 500 mg by mouth 2 (two) times daily.     montelukast (SINGULAIR) 10 MG tablet TAKE 1 TABLET BY MOUTH DAILY 90 tablet 3   pantoprazole (PROTONIX) 40 MG tablet ONE TABLET BY MOUTH DAILY 90 tablet 2   traZODone (DESYREL) 25 mg TABS tablet Take 25 mg by mouth at bedtime.     Vitamin D, Ergocalciferol, (DRISDOL) 1.25 MG (50000 UNIT) CAPS capsule TAKE 1 CAPSULE BY MOUTH WEEKLY 12 capsule 0   No facility-administered medications prior to visit.    Review of Systems  Constitutional:  Negative for chills, fever,  malaise/fatigue and weight loss.  HENT:  Negative for congestion, sinus pain and sore throat.   Eyes: Negative.   Respiratory:  Positive for cough, sputum production and shortness of breath. Negative for hemoptysis and wheezing.   Cardiovascular:  Negative for chest pain, palpitations, orthopnea, claudication, leg swelling and PND.  Gastrointestinal:  Negative for abdominal pain, heartburn, nausea and vomiting.  Genitourinary: Negative.   Musculoskeletal: Negative.   Neurological:  Negative for dizziness, weakness and headaches.  Psychiatric/Behavioral: Negative.     Objective:   There were no vitals filed for this visit.   Physical Exam Constitutional:      General: She is not in acute distress.    Appearance: She is obese.  HENT:     Head: Normocephalic and atraumatic.     Nose: Nose normal.     Mouth/Throat:     Mouth: Mucous membranes are moist.     Pharynx: Oropharynx is clear.  Eyes:     General: No scleral icterus.    Conjunctiva/sclera: Conjunctivae normal.     Pupils: Pupils are equal, round, and reactive to light.  Cardiovascular:     Rate and Rhythm: Normal rate and regular rhythm.     Pulses: Normal pulses.     Heart sounds: Normal heart sounds. No murmur heard. Pulmonary:     Effort: Pulmonary effort is normal.     Breath sounds: No wheezing, rhonchi or rales.  Abdominal:     General: Bowel sounds are normal.     Palpations: Abdomen is soft.  Musculoskeletal:     Cervical back: Neck supple.     Right lower leg: No edema.     Left lower leg: No edema.  Lymphadenopathy:     Cervical: No cervical  adenopathy.  Skin:    General: Skin is warm and dry.  Neurological:     General: No focal deficit present.     Mental Status: She is alert.  Psychiatric:        Mood and Affect: Mood normal.        Behavior: Behavior normal.        Thought Content: Thought content normal.        Judgment: Judgment normal.    CBC    Component Value Date/Time   WBC 5.0  02/04/2021 1211   WBC 10.9 (H) 09/02/2018 0414   RBC 3.50 (L) 02/04/2021 1211   HGB 10.2 (L) 02/04/2021 1211   HGB 10.9 (L) 04/09/2019 1530   HGB 11.9 07/24/2017 1334   HCT 33.4 (L) 02/04/2021 1211   HCT 32.4 (L) 04/09/2019 1530   HCT 37.1 07/24/2017 1334   PLT 258 02/04/2021 1211   PLT 281 04/09/2019 1530   MCV 95.4 02/04/2021 1211   MCV 93 04/09/2019 1530   MCV 96.7 07/24/2017 1334   MCH 29.1 02/04/2021 1211   MCHC 30.5 02/04/2021 1211   RDW 13.5 02/04/2021 1211   RDW 13.3 04/09/2019 1530   RDW 14.6 (H) 07/24/2017 1334   LYMPHSABS 1.4 02/04/2021 1211   LYMPHSABS 1.8 07/24/2017 1334   MONOABS 0.3 02/04/2021 1211   MONOABS 0.4 07/24/2017 1334   EOSABS 0.2 02/04/2021 1211   EOSABS 0.2 07/24/2017 1334   BASOSABS 0.0 02/04/2021 1211   BASOSABS 0.0 07/24/2017 1334   BMP Latest Ref Rng & Units 02/04/2021 07/26/2020 05/21/2020  Glucose 70 - 99 mg/dL 161(W) 960(A) 540(J)  BUN 8 - 23 mg/dL Creatinine 0.44 - 1.00 mg/dL 8.11(B) 1.47(W) 2.95(A)  BUN/Creat Ratio 12 - 28 - 11(L) -  Sodium 135 - 145 mmol/L 145 143 142  Potassium 3.5 - 5.1 mmol/L 3.7 4.4 4.3  Chloride 98 - 111 mmol/L 103 100 103  CO2 22 - 32 mmol/L Calcium 8.9 - 10.3 mg/dL 9.4 9.5 9.7   Chest imaging: CXR 09/02/2018 There is shallow lung inflation. The cardiomediastinal contours are normal.   There is no focal airspace consolidation or pulmonary edema. There is no pleural effusion or pneumothorax.  HRCT Chest 06/08/2016 1. Again noted is mild diffuse cylindrical bronchiectasis and thickening of the peribronchovascular interstitium. The subtle areas of ground-glass attenuation and septal thickening in the lungs persist compared to the prior study, but appear unchanged, and may indicate areas of nonspecific interstitial pneumonia (NSIP). 2. Today's study also demonstrates mild air trapping, indicative of small airways disease, and demonstrates evidence of tracheobronchomalacia. In retrospect, the  prior expiratory phase images were unlikely to be truly expiratory on study 04/13/2015. 3. Aortic atherosclerosis, in addition to three-vessel coronary artery disease. Please note that although the presence of coronary artery calcium documents the presence of coronary artery disease, the severity of this disease and any potential stenosis cannot be assessed on this non-gated CT examination. Assessment for potential risk factor modification, dietary therapy or pharmacologic therapy may be warranted, if clinically indicated. 4. Cardiomegaly with mild left atrial dilatation.  V/Q Scan 05/30/2015 No ventilation or perfusion defects identified. Very low probability of pulmonary embolus.  PFT: PFT Results Latest Ref Rng & Units 05/30/2016 04/16/2015  FVC-Pre L 1.16 1.18  FVC-Predicted Pre % 55 55  FVC-Post L 1.16 1.37  FVC-Predicted Post % 55 64  Pre FEV1/FVC % % 88 88  Post FEV1/FCV % % 89 88  FEV1-Pre  L 1.02 1.04  FEV1-Predicted Pre % 63 62  FEV1-Post L 1.03 1.20  DLCO uncorrected ml/min/mmHg 13.80 7.13  DLCO UNC% % 68 35  DLCO corrected ml/min/mmHg 14.85 7.91  DLCO COR %Predicted % 73 39  DLVA Predicted % 147 85  TLC L 2.84 2.72  TLC % Predicted % 61 59  RV % Predicted % 74 71   Echo: 04/13/2015 - Left ventricle: The cavity size was normal. Wall thickness was    normal. Systolic function was normal. The estimated ejection    fraction was in the range of 60% to 65%. Wall motion was normal;    there were no regional wall motion abnormalities. Doppler    parameters are consistent with abnormal left ventricular    relaxation (grade 1 diastolic dysfunction).  - Left atrium: The atrium was mildly dilated.   Stress Test 01/31/2017 1. EF 66%, normal wall motion.  2. Fixed, small, mild mid anterior perfusion defect.  Given normal wall motion, suspect soft tissue attenuation rather than infarction.  No ischemia.  3. Low risk study.    Assessment & Plan:   No diagnosis  found.  Discussion: Anita Gonzalez is a 75 year old woman, former smoker with chronic hypoxemic respiratory failure on 2L O2 with ambulation, asthma, bronchiectasis, tracheobronchomalacia and obstructive sleep apnea who returns to pulmonary clinic for follow up.     Her asthma is better controlled with Trelegy Ellipta 1 puff daily and as needed albuterol.   She has been referred to weight management clinic at the last visit in order to reduce her risk of obstructive sleep apnea. We have discussed the importance of treating sleep apnea as it can lead to risks of heart arrhythmias, heart failure and strokes.   Follow up in 4 months  Melody Comas, MD [facility] Office: [phone]   See Amion for Pager Details    Current Outpatient Medications:    ADVAIR DISKUS 500-50 MCG/ACT AEPB, INHALE 1 PUFF BY MOUTH 2 TIMES A DAY, Disp: 60 each, Rfl: 10   albuterol (PROVENTIL) (2.5 MG/3ML) 0.083% nebulizer solution, USE 1 UNIT DOSE(VIAL)VIA NEBULIZER 4 TIMES A DAY AS NEEDED AS DIRECTED, Disp: 1080 mL, Rfl: 3   albuterol (VENTOLIN HFA) 108 (90 Base) MCG/ACT inhaler, INHALE 2 PUFFS BY MOUTH EVERY 6 HRS AS NEEDED, Disp: 54 g, Rfl: 10   amLODipine (NORVASC) 5 MG tablet, TAKE 1 TABLET BY MOUTH IN THE MORNING AND AT BEDTIME, Disp: 180 tablet, Rfl: 3   aspirin EC 81 MG tablet, Take 1 tablet (81 mg total) by mouth daily., Disp: 90 tablet, Rfl: 3   atorvastatin (LIPITOR) 40 MG tablet, ONE TABLET BY MOUTH DAILY, Disp: 90 tablet, Rfl: 0   bumetanide (BUMEX) 2 MG tablet, TAKE 1 TABLET BY MOUTH 2 TIMES A WEEK, Disp: 24 tablet, Rfl: 3   cyanocobalamin 1000 MCG tablet, Take 1,000 mcg by mouth every Thursday. , Disp: , Rfl:    cyclobenzaprine (FLEXERIL) 10 MG tablet, Take 10 mg by mouth at bedtime. , Disp: , Rfl:    diclofenac sodium (VOLTAREN) 1 % GEL, APPLY 2 GRAM TO THE AFFECTED AREAS 4 TIMES A DAY AS NEEDED FOR KNEE PAIN , Disp: 700 g, Rfl: 3   diphenoxylate-atropine (LOMOTIL) 2.5-0.025 MG  tablet, Take 1 tablet by mouth 4 (four) times daily as needed for diarrhea or loose stools. , Disp: , Rfl:    docusate sodium (COLACE) 100 MG capsule, Take 100 mg by mouth 2 (two) times daily as needed  for mild constipation., Disp: , Rfl:    ezetimibe (ZETIA) 10 MG tablet, ONE TABLET BY MOUTH DAILY, Disp: 90 tablet, Rfl: 3   ferrous sulfate 325 (65 FE) MG tablet, Take 325 mg by mouth 2 (two) times daily with a meal. , Disp: , Rfl:    fluticasone (FLONASE) 50 MCG/ACT nasal spray, Place 1 spray into both nostrils daily as needed for allergies or rhinitis., Disp: , Rfl:    folic acid (FOLVITE) 1 MG tablet, TAKE 1 TABLET BY MOUTH DAILY, Disp: 90 tablet, Rfl: 3   gabapentin (NEURONTIN) 600 MG tablet, Take 600 mg by mouth 3 (three) times daily., Disp: , Rfl:    losartan (COZAAR) 50 MG tablet, TAKE 1 TABLET BY MOUTH DAILY, Disp: 90 tablet, Rfl: 3   metFORMIN (GLUCOPHAGE-XR) 500 MG 24 hr tablet, Take 500 mg by mouth 2 (two) times daily., Disp: , Rfl:    montelukast (SINGULAIR) 10 MG tablet, TAKE 1 TABLET BY MOUTH DAILY, Disp: 90 tablet, Rfl: 3   pantoprazole (PROTONIX) 40 MG tablet, ONE TABLET BY MOUTH DAILY, Disp: 90 tablet, Rfl: 2   traZODone (DESYREL) 25 mg TABS tablet, Take 25 mg by mouth at bedtime., Disp: , Rfl:    Vitamin D, Ergocalciferol, (DRISDOL) 1.25 MG (50000 UNIT) CAPS capsule, TAKE 1 CAPSULE BY MOUTH WEEKLY, Disp: 12 capsule, Rfl: 0

## 2021-07-26 ENCOUNTER — Ambulatory Visit (HOSPITAL_COMMUNITY): Payer: Medicare HMO | Attending: Internal Medicine

## 2021-07-26 ENCOUNTER — Encounter (HOSPITAL_COMMUNITY): Payer: Self-pay

## 2021-07-26 DIAGNOSIS — K7402 Hepatic fibrosis, advanced fibrosis: Secondary | ICD-10-CM | POA: Insufficient documentation

## 2021-07-26 DIAGNOSIS — I2510 Atherosclerotic heart disease of native coronary artery without angina pectoris: Secondary | ICD-10-CM | POA: Insufficient documentation

## 2021-07-26 DIAGNOSIS — K219 Gastro-esophageal reflux disease without esophagitis: Secondary | ICD-10-CM | POA: Insufficient documentation

## 2021-07-26 DIAGNOSIS — J449 Chronic obstructive pulmonary disease, unspecified: Secondary | ICD-10-CM | POA: Insufficient documentation

## 2021-07-27 ENCOUNTER — Ambulatory Visit (HOSPITAL_COMMUNITY): Payer: Medicare HMO | Attending: Cardiovascular Disease

## 2021-07-27 ENCOUNTER — Other Ambulatory Visit: Payer: Self-pay

## 2021-07-27 DIAGNOSIS — R011 Cardiac murmur, unspecified: Secondary | ICD-10-CM | POA: Diagnosis not present

## 2021-07-27 LAB — ECHOCARDIOGRAM COMPLETE
Area-P 1/2: 3.12 cm2
S' Lateral: 1.75 cm

## 2021-08-03 ENCOUNTER — Other Ambulatory Visit: Payer: Self-pay

## 2021-08-03 DIAGNOSIS — D500 Iron deficiency anemia secondary to blood loss (chronic): Secondary | ICD-10-CM

## 2021-08-03 DIAGNOSIS — J449 Chronic obstructive pulmonary disease, unspecified: Secondary | ICD-10-CM | POA: Diagnosis not present

## 2021-08-04 ENCOUNTER — Inpatient Hospital Stay: Payer: Medicare HMO | Attending: Hematology

## 2021-08-04 ENCOUNTER — Inpatient Hospital Stay (HOSPITAL_BASED_OUTPATIENT_CLINIC_OR_DEPARTMENT_OTHER): Payer: Medicare HMO | Admitting: Hematology

## 2021-08-04 ENCOUNTER — Other Ambulatory Visit: Payer: Self-pay

## 2021-08-04 VITALS — BP 134/59 | HR 78 | Temp 98.1°F | Resp 19 | Ht 61.0 in | Wt 216.8 lb

## 2021-08-04 DIAGNOSIS — D631 Anemia in chronic kidney disease: Secondary | ICD-10-CM | POA: Diagnosis not present

## 2021-08-04 DIAGNOSIS — D500 Iron deficiency anemia secondary to blood loss (chronic): Secondary | ICD-10-CM | POA: Diagnosis not present

## 2021-08-04 DIAGNOSIS — M069 Rheumatoid arthritis, unspecified: Secondary | ICD-10-CM | POA: Diagnosis not present

## 2021-08-04 DIAGNOSIS — Z79899 Other long term (current) drug therapy: Secondary | ICD-10-CM | POA: Diagnosis not present

## 2021-08-04 DIAGNOSIS — Q2730 Arteriovenous malformation, site unspecified: Secondary | ICD-10-CM | POA: Diagnosis not present

## 2021-08-04 DIAGNOSIS — N183 Chronic kidney disease, stage 3 unspecified: Secondary | ICD-10-CM | POA: Insufficient documentation

## 2021-08-04 LAB — COMPREHENSIVE METABOLIC PANEL
ALT: 9 U/L (ref 0–44)
AST: 11 U/L — ABNORMAL LOW (ref 15–41)
Albumin: 3.6 g/dL (ref 3.5–5.0)
Alkaline Phosphatase: 105 U/L (ref 38–126)
Anion gap: 12 (ref 5–15)
BUN: 16 mg/dL (ref 8–23)
CO2: 30 mmol/L (ref 22–32)
Calcium: 9.5 mg/dL (ref 8.9–10.3)
Chloride: 102 mmol/L (ref 98–111)
Creatinine, Ser: 1.22 mg/dL — ABNORMAL HIGH (ref 0.44–1.00)
GFR, Estimated: 47 mL/min — ABNORMAL LOW (ref >60)
Glucose, Bld: 173 mg/dL — ABNORMAL HIGH (ref 70–99)
Potassium: 4 mmol/L (ref 3.5–5.1)
Sodium: 144 mmol/L (ref 135–145)
Total Bilirubin: 0.3 mg/dL (ref 0.3–1.2)
Total Protein: 6.7 g/dL (ref 6.5–8.1)

## 2021-08-04 LAB — CBC WITH DIFFERENTIAL/PLATELET
Abs Immature Granulocytes: 0.02 10*3/uL (ref 0.00–0.07)
Basophils Absolute: 0 10*3/uL (ref 0.0–0.1)
Basophils Relative: 0 %
Eosinophils Absolute: 0.2 10*3/uL (ref 0.0–0.5)
Eosinophils Relative: 3 %
HCT: 29.9 % — ABNORMAL LOW (ref 36.0–46.0)
Hemoglobin: 9 g/dL — ABNORMAL LOW (ref 12.0–15.0)
Immature Granulocytes: 0 %
Lymphocytes Relative: 29 %
Lymphs Abs: 1.8 10*3/uL (ref 0.7–4.0)
MCH: 28.8 pg (ref 26.0–34.0)
MCHC: 30.1 g/dL (ref 30.0–36.0)
MCV: 95.5 fL (ref 80.0–100.0)
Monocytes Absolute: 0.3 10*3/uL (ref 0.1–1.0)
Monocytes Relative: 6 %
Neutro Abs: 3.8 10*3/uL (ref 1.7–7.7)
Neutrophils Relative %: 62 %
Platelets: 276 10*3/uL (ref 150–400)
RBC: 3.13 MIL/uL — ABNORMAL LOW (ref 3.87–5.11)
RDW: 14.8 % (ref 11.5–15.5)
WBC: 6.2 10*3/uL (ref 4.0–10.5)
nRBC: 0 % (ref 0.0–0.2)

## 2021-08-04 LAB — FERRITIN: Ferritin: 59 ng/mL (ref 11–307)

## 2021-08-04 LAB — IRON AND TIBC
Iron: 33 ug/dL — ABNORMAL LOW (ref 41–142)
Saturation Ratios: 12 % — ABNORMAL LOW (ref 21–57)
TIBC: 274 ug/dL (ref 236–444)
UIBC: 241 ug/dL (ref 120–384)

## 2021-08-04 LAB — VITAMIN B12: Vitamin B-12: 221 pg/mL (ref 180–914)

## 2021-08-04 LAB — FOLATE: Folate: 58.5 ng/mL (ref >5.9)

## 2021-08-04 MED ORDER — CYCLOBENZAPRINE HCL 10 MG PO TABS
10.0000 mg | ORAL_TABLET | Freq: Every day | ORAL | 0 refills | Status: DC
Start: 2021-08-04 — End: 2021-09-28

## 2021-08-04 NOTE — Progress Notes (Signed)
[facility]   [phone] Fax:([phone]   Clinic Follow up Note   Patient Care Team: [name], MD as PCP - General (Internal Medicine) Pricilla Riffle, MD as PCP - Cardiology (Cardiology) Jearld Lesch, MD as Referring Physician (Specialist) Jearld Lesch, MD as Referring Physician (Specialist) Jeani Hawking, MD as Consulting Physician (Gastroenterology) Clyde Lundborg., MD as Referring Physician (Anesthesiology) Larey Dresser, DPM as Consulting Physician (Podiatry)  Date of Service:  08/04/2021  CHIEF COMPLAINT: f/u of iron deficient anemia  CURRENT THERAPY:  -Oral iron Ferous sulfate BID, Folic Acid, oral B12 daily.  -IV Feraheme as needed, last in 02/2021.   ASSESSMENT & PLAN:  Anita Gonzalez is a 75 y.o. female with   1. Normocytic anemia secondary to iron deficiency, from GI AVM bleeding  -She has moderate anemia with baseline hemoglobin in 8-9 range, MCV normal, ferritin 16, serum iron 13, URBC elevated at 430, iron saturation 3%, this is consistent with iron deficient anemia -Anemia has been several years, likely secondary to slow GI bleeding. Her stool OB was positive. Colonoscopy showed AVM, last done in 03/2015. Her 03/15/18 Upper Endoscopy with Dr Elnoria Howard showed no evidence of bleeding.  -Her baseline SPEP and UPEP were negative.  -She also may have a component of anemia of chronic disease secondary to kidney disease and rheumatoid arthritis -She has received IV Feraheme as needed since 2016, most recently 02/2021. She responded well and continues oral iron BID.  -She continues to have progressively worse fatigue. She denies GI bleeding or overt blood loss. Labs reviewed, Hg 9. CMP and iron panel still pending. If her iron is low, I will give IV Ferahame in the near future. -Continue oral B12, Folic acid, iron ferrous sulfate BID daily. Will set up iv feraheme if ferritin<100  -Given symptoms, will monitor with  labs and f/u every 2-3 months    2. Hypertension, diabetes, rheumatoid arthritis, asthma, CKD stage III, H/o Hep C (s/p treatment) -She continues f/u with her PCP -Her CKD is secondary to her HTN and DM, she will continue to mange them.          -She has been on Ibuprofen for Rheumatoid Arthritis        -She previously saw Dr. Elsie Stain. She has not been seen for about a year, and her last liver US was in 2019.   3. Insomnia  -Her increased fatigue is multifactorial including her trouble sleeping. I previously recommend she increase her Melatonin to 10-15mg .   4. Social Support -she reports transportation issues, which has caused her to miss appointments. -She was previously unaware [place] had a transportation service. We will get her connected.    Plan -based on today's lab results, will schedule IV venofer, will do 400mg  infusion due to her transportation issue  -Continue oral iron BID, Folic Acid -Lab every 2 months and f/u in 4 months    No problem-specific Assessment & Plan notes found for this encounter.   INTERVAL HISTORY:  Anita Gonzalez is here for a follow up of anemia. She was last seen by me on 02/04/21. She presents to the clinic alone. She reports having zero energy. She expressed frustration today with one of her doctors. She notes she barely sees them and feels they don't care. For example, she is told not to take a medication but is not given an alternative. I recommended she express her concerns, but she feels they will still not  listen. She notes she is looking for another physician. She notes she is in the process of moving and will call us with her new address.   All other systems were reviewed with the patient and are negative.  MEDICAL HISTORY:  Past Medical History:  Diagnosis Date   Acute kidney injury (HCC) 01/11/2015   Alcohol abuse, in remission    Anemia    Anemia, iron deficiency 01/28/2015   Arthritis    Asthma    Bronchiectasis (HCC) 03/07/2016    Bronchitis    COPD (chronic obstructive pulmonary disease) (HCC)    Diabetes mellitus without complication (HCC)    type 2   DM II (diabetes mellitus, type II), controlled (HCC) 01/06/2015   Dyspnea and respiratory abnormality 04/06/2015   Dysrhythmia    hx of palpitations   E-coli UTI 01/11/2015   Essential hypertension 01/06/2015   GERD (gastroesophageal reflux disease)    Hay fever    Hepatitis    c treated with pills   HLD (hyperlipidemia) 01/06/2015   Hypertension    IBS (irritable bowel syndrome)    ILD (interstitial lung disease) (HCC) 12/11/2016   Leg cramps, sleep related 12/01/2015   Pneumonia    Rheumatoid arthritis (HCC) 1996   Sinus tachycardia 01/11/2015   LT monitor 01/2019: normal sinus rhythm, occ short bursts of PAT, accelerated junctional rhythm, no sig arrhythmias.     SURGICAL HISTORY: Past Surgical History:  Procedure Laterality Date   ABDOMINAL HYSTERECTOMY     BALLOON DILATION N/A 03/15/2018   Procedure: BALLOON DILATION;  Surgeon: Jeani Hawking, MD;  Location: [facility];  Service: Endoscopy;  Laterality: N/A;   CARPAL TUNNEL RELEASE Left 04/19/2018   Procedure: CARPAL TUNNEL RELEASE;  Surgeon: Coletta Memos, MD;  Location: [facility] OR;  Service: Neurosurgery;  Laterality: Left;  CARPAL TUNNEL RELEASE   CESAREAN SECTION     COLONOSCOPY WITH PROPOFOL N/A 04/02/2015   Procedure: COLONOSCOPY WITH PROPOFOL;  Surgeon: Jeani Hawking, MD;  Location: [facility];  Service: Endoscopy;  Laterality: N/A;   ESOPHAGOGASTRODUODENOSCOPY (EGD) WITH PROPOFOL N/A 04/02/2015   Procedure: ESOPHAGOGASTRODUODENOSCOPY (EGD) WITH PROPOFOL;  Surgeon: Jeani Hawking, MD;  Location: [facility];  Service: Endoscopy;  Laterality: N/A;   ESOPHAGOGASTRODUODENOSCOPY (EGD) WITH PROPOFOL N/A 03/15/2018   Procedure: ESOPHAGOGASTRODUODENOSCOPY (EGD) WITH PROPOFOL;  Surgeon: Jeani Hawking, MD;  Location: [facility];  Service: Endoscopy;  Laterality: N/A;   HEMORRHOID SURGERY     HERNIA REPAIR      HERNIA REPAIR      I have reviewed the social history and family history with the patient and they are unchanged from previous note.  ALLERGIES:  is allergic to fish allergy; peanuts [peanut oil]; penicillins; iodinated diagnostic agents; iodine; percocet [oxycodone-acetaminophen]; 2,4-d dimethylamine (amisol); peanut (diagnostic); peanut allergen powder-dnfp; hydrocodone; other; oxycodone; and sulfa antibiotics.  MEDICATIONS:  Current Outpatient Medications  Medication Sig Dispense Refill   ADVAIR DISKUS 500-50 MCG/ACT AEPB INHALE 1 PUFF BY MOUTH 2 TIMES A DAY 60 each 10   albuterol (PROVENTIL) (2.5 MG/3ML) 0.083% nebulizer solution USE 1 UNIT DOSE(VIAL)VIA NEBULIZER 4 TIMES A DAY AS NEEDED AS DIRECTED 1080 mL 3   albuterol (VENTOLIN HFA) 108 (90 Base) MCG/ACT inhaler INHALE 2 PUFFS BY MOUTH EVERY 6 HRS AS NEEDED 54 g 10   amLODipine (NORVASC) 5 MG tablet TAKE 1 TABLET BY MOUTH IN THE MORNING AND AT BEDTIME 180 tablet 3   aspirin EC 81 MG tablet Take 1 tablet (81 mg total) by mouth daily. 90 tablet 3  atorvastatin (LIPITOR) 40 MG tablet ONE TABLET BY MOUTH DAILY 90 tablet 0   bumetanide (BUMEX) 2 MG tablet TAKE 1 TABLET BY MOUTH 2 TIMES A WEEK 24 tablet 3   cyanocobalamin 1000 MCG tablet Take 1,000 mcg by mouth every Thursday.      cyclobenzaprine (FLEXERIL) 10 MG tablet Take 1 tablet (10 mg total) by mouth at bedtime. 30 tablet 0   diclofenac sodium (VOLTAREN) 1 % GEL APPLY 2 GRAM TO THE AFFECTED AREAS 4 TIMES A DAY AS NEEDED FOR KNEE PAIN  700 g 3   diphenoxylate-atropine (LOMOTIL) 2.5-0.025 MG tablet Take 1 tablet by mouth 4 (four) times daily as needed for diarrhea or loose stools.      docusate sodium (COLACE) 100 MG capsule Take 100 mg by mouth 2 (two) times daily as needed for mild constipation.     ezetimibe (ZETIA) 10 MG tablet ONE TABLET BY MOUTH DAILY 90 tablet 3   ferrous sulfate 325 (65 FE) MG tablet Take 325 mg by mouth 2 (two) times daily with a meal.      fluticasone  (FLONASE) 50 MCG/ACT nasal spray Place 1 spray into both nostrils daily as needed for allergies or rhinitis.     folic acid (FOLVITE) 1 MG tablet TAKE 1 TABLET BY MOUTH DAILY 90 tablet 3   gabapentin (NEURONTIN) 600 MG tablet Take 600 mg by mouth 3 (three) times daily.     losartan (COZAAR) 50 MG tablet TAKE 1 TABLET BY MOUTH DAILY 90 tablet 3   metFORMIN (GLUCOPHAGE-XR) 500 MG 24 hr tablet Take 500 mg by mouth 2 (two) times daily.     montelukast (SINGULAIR) 10 MG tablet TAKE 1 TABLET BY MOUTH DAILY 90 tablet 3   pantoprazole (PROTONIX) 40 MG tablet ONE TABLET BY MOUTH DAILY 90 tablet 2   traZODone (DESYREL) 25 mg TABS tablet Take 25 mg by mouth at bedtime.     Vitamin D, Ergocalciferol, (DRISDOL) 1.25 MG (50000 UNIT) CAPS capsule TAKE 1 CAPSULE BY MOUTH WEEKLY 12 capsule 0   No current facility-administered medications for this visit.    PHYSICAL EXAMINATION: ECOG PERFORMANCE STATUS: 3 - Symptomatic, >50% confined to bed  Vitals:   08/04/21 1242  BP: (!) 134/59  Pulse: 78  Resp: 19  Temp: 98.1 F (36.7 C)   Wt Readings from Last 3 Encounters:  08/04/21 216 lb 12.8 oz (98.3 kg)  07/05/21 214 lb (97.1 kg)  02/24/21 216 lb 3.2 oz (98.1 kg)     GENERAL:alert, no distress and comfortable SKIN: skin color normal, no rashes or significant lesions EYES: normal, Conjunctiva are pink and non-injected, sclera clear  NEURO: alert & oriented x 3 with fluent speech  LABORATORY DATA:  I have reviewed the data as listed CBC Latest Ref Rng & Units 08/04/2021 02/04/2021 05/21/2020  WBC 4.0 - 10.5 K/uL 6.2 5.0 7.4  Hemoglobin 12.0 - 15.0 g/dL 9.0(L) 10.2(L) 9.8(L)  Hematocrit 36.0 - 46.0 % 29.9(L) 33.4(L) 32.5(L)  Platelets 150 - 400 K/uL 276 258 320     CMP Latest Ref Rng & Units 08/04/2021 02/04/2021 07/26/2020  Glucose 70 - 99 mg/dL 423(N) 361(W) 431(V)  BUN 8 - 23 mg/dL 16 9 18   Creatinine 0.44 - 1.00 mg/dL ) 4.00(Q) 6.76(P)  Sodium 135 - 145 mmol/L 144 145 143  Potassium 3.5 -  5.1 mmol/L 4.0 3.7 4.4  Chloride 98 - 111 mmol/L 102 103 100  CO2 22 - 32 mmol/L 30 31 26   Calcium 8.9 - 10.3  mg/dL 9.5 9.4 9.5  Total Protein 6.5 - 8.1 g/dL 6.7 6.9 -  Total Bilirubin 0.3 - 1.2 mg/dL 0.3 0.3 -  Alkaline Phos 38 - 126 U/L 105 104 -  AST 15 - 41 U/L 11(L) 9(L) -  ALT 0 - 44 U/L 9 8 -      RADIOGRAPHIC STUDIES: I have personally reviewed the radiological images as listed and agreed with the findings in the report. No results found.    No orders of the defined types were placed in this encounter.  All questions were answered. The patient knows to call the clinic with any problems, questions or concerns. No barriers to learning was detected. The total time spent in the appointment was 20 minutes.     Malachy Mood, MD 08/04/2021   I, Mickie Bail, am acting as scribe for Malachy Mood, MD.   I have reviewed the above documentation for accuracy and completeness, and I agree with the above.

## 2021-08-07 ENCOUNTER — Other Ambulatory Visit: Payer: Self-pay | Admitting: Hematology

## 2021-08-09 ENCOUNTER — Telehealth: Payer: Self-pay | Admitting: Hematology

## 2021-08-09 NOTE — Telephone Encounter (Signed)
Scheduled per sch msg. Called and spoke with patient. Confirmed appt  

## 2021-08-11 ENCOUNTER — Encounter: Payer: Self-pay | Admitting: Pulmonary Disease

## 2021-08-11 ENCOUNTER — Other Ambulatory Visit: Payer: Self-pay

## 2021-08-11 ENCOUNTER — Ambulatory Visit (INDEPENDENT_AMBULATORY_CARE_PROVIDER_SITE_OTHER): Payer: Medicare HMO | Admitting: Pulmonary Disease

## 2021-08-11 ENCOUNTER — Other Ambulatory Visit: Payer: Self-pay | Admitting: Physician Assistant

## 2021-08-11 VITALS — BP 120/74 | HR 76 | Ht 61.0 in | Wt 215.0 lb

## 2021-08-11 DIAGNOSIS — J398 Other specified diseases of upper respiratory tract: Secondary | ICD-10-CM

## 2021-08-11 DIAGNOSIS — J9611 Chronic respiratory failure with hypoxia: Secondary | ICD-10-CM | POA: Diagnosis not present

## 2021-08-11 DIAGNOSIS — E119 Type 2 diabetes mellitus without complications: Secondary | ICD-10-CM | POA: Diagnosis not present

## 2021-08-11 DIAGNOSIS — J4550 Severe persistent asthma, uncomplicated: Secondary | ICD-10-CM | POA: Diagnosis not present

## 2021-08-11 DIAGNOSIS — J479 Bronchiectasis, uncomplicated: Secondary | ICD-10-CM

## 2021-08-11 MED ORDER — SPIRIVA RESPIMAT 2.5 MCG/ACT IN AERS: 2.0000 | INHALATION_SPRAY | Freq: Every day | RESPIRATORY_TRACT | 11 refills | Status: DC

## 2021-08-11 MED ORDER — SPIRIVA RESPIMAT 2.5 MCG/ACT IN AERS: 2.0000 | INHALATION_SPRAY | Freq: Every day | RESPIRATORY_TRACT | 0 refills | Status: DC

## 2021-08-11 NOTE — Patient Instructions (Addendum)
Continue advair diskus 1 puff twice daily  Start spiriva respimat inhaler 2 puffs daily  Continue to use albuterol as needed.

## 2021-08-11 NOTE — Progress Notes (Signed)
Synopsis: Returns to clinic to re-establish care  Subjective:   PATIENT ID: Anita Gonzalez GENDER: female DOB: Feb 19, 1946, MRN: [id number]  HPI  Chief Complaint  Patient presents with   Follow-up    F/U on asthma. States she has been more SOB lately and having chest congestion. Yellow phlegm. Denies any wheezing.    Anita Gonzalez is a 75 year old woman, former smoker with chronic hypoxemic respiratory failure on 2L O2 with ambulation, asthma and obstructive sleep apnea who returns to pulmonary clinic for follow up.    She is currently using advair diskus instead of trelegy as her insurance company would not cover Trelegy. She has ongoing exertional dyspnea and coughing with exertion.  She was diagnosed with sleep apnea in the past and has not been on CPAP as she did not tolerate it previously.   PFTs from 2017 show FEV1/FVC 89, FEV1 1.03L (63%), FVC 1.16L (55%), TLC 2.84 (61%), DLCO 68% consistent with a mild restrictive defect and mild diffusion defect.   HR CT Chest in 2017 showed scattered areas of mild cylindrical bronchiectasis and associated thickening of the peribronchovascular interstitium. Inspiratory and expiratory imaging demonstrates air trapping. There is severe collapse of the trachea and mild collapse of the mainstem bronchi on expiratory phase indicative of tracheobronchomalacia.    Past Medical History:  Diagnosis Date   Acute kidney injury (HCC) 01/11/2015   Alcohol abuse, in remission    Anemia    Anemia, iron deficiency 01/28/2015   Arthritis    Asthma    Bronchiectasis (HCC) 03/07/2016   Bronchitis    COPD (chronic obstructive pulmonary disease) (HCC)    Diabetes mellitus without complication (HCC)    type 2   DM II (diabetes mellitus, type II), controlled (HCC) 01/06/2015   Dyspnea and respiratory abnormality 04/06/2015   Dysrhythmia    hx of palpitations   E-coli UTI 01/11/2015   Essential hypertension 01/06/2015   GERD (gastroesophageal reflux disease)    Hay  fever    Hepatitis    c treated with pills   HLD (hyperlipidemia) 01/06/2015   Hypertension    IBS (irritable bowel syndrome)    ILD (interstitial lung disease) (HCC) 12/11/2016   Leg cramps, sleep related 12/01/2015   Pneumonia    Rheumatoid arthritis (HCC) 1996   Sinus tachycardia 01/11/2015   LT monitor 01/2019: normal sinus rhythm, occ short bursts of PAT, accelerated junctional rhythm, no sig arrhythmias.      Family History  Problem Relation Age of Onset   Diabetes Mother    Heart failure Mother    Throat cancer Brother        throat cancer    Asthma Other    COPD Other    Hypertension Other    Stroke Other    Heart disease Other      Social History   Socioeconomic History   Marital status: Single    Spouse name: Not on file   Number of children: Not on file   Years of education: Not on file   Highest education level: Not on file  Occupational History   Occupation: retired  Tobacco Use   Smoking status: Former    Packs/day: 0.75    Years: 25.00    Pack years: 18.75    Types: Cigarettes    Quit date: 01/12/2011    Years since quitting: 10.5   Smokeless tobacco: Never  Vaping Use   Vaping Use: Never used  Substance and Sexual Activity  Alcohol use: Yes    Alcohol/week: 4.0 standard drinks    Types: 4 Cans of beer per week    Comment: heavy drinker in the past   Drug use: No   Sexual activity: Not Currently  Other Topics Concern   Not on file  Social History Narrative   Not on file   Social Determinants of Health   Financial Resource Strain: Not on file  Food Insecurity: Not on file  Transportation Needs: Not on file  Physical Activity: Not on file  Stress: Not on file  Social Connections: Not on file  Intimate Partner Violence: Not on file     Allergies  Allergen Reactions   Fish Allergy Anaphylaxis, Hives, Swelling and Other (See Comments)   Peanuts [Peanut Oil] Anaphylaxis, Hives, Itching and Swelling   Penicillins Hives, Itching, Swelling  and Other (See Comments)    PATIENT HAS HAD A PCN REACTION WITH IMMEDIATE RASH, FACIAL/TONGUE/THROAT SWELLING, SOB, OR LIGHTHEADEDNESS WITH HYPOTENSION:  #  #  YES  #  #  HAS PT DEVELOPED SEVERE RASH INVOLVING MUCUS MEMBRANES or SKIN NECROSIS: #  #  YES  #  # PATIENT HAS HAD A PCN REACTION THAT REQUIRED HOSPITALIZATION:  #  #  YES  #  #  Has patient had a PCN reaction occurring within the last 10 years: No    Iodinated Diagnostic Agents Hives, Itching and Swelling    SWELLING REACTION UNSPECIFIED    Iodine Hives, Itching and Swelling    Patient reports receiving IV dye in abdominal CT scan without issue   Percocet [Oxycodone-Acetaminophen] Nausea Only, Anxiety and Other (See Comments)    Raised blood pressure, Sweats, nervous,dizziness   2,4-D Dimethylamine (Amisol) Other (See Comments)   Peanut (Diagnostic) Other (See Comments)   Peanut Allergen Powder-Dnfp    Hydrocodone Itching and Nausea Only   Other Other (See Comments)    UNSPECIFIED REACTION  Hypersensitive to perfumes, colognes, powders, lotions, room sprays   Oxycodone Itching, Nausea Only and Other (See Comments)    Sweats and dizziness   Sulfa Antibiotics     UNSPECIFIED REACTION      Outpatient Medications Prior to Visit  Medication Sig Dispense Refill   ADVAIR DISKUS 500-50 MCG/ACT AEPB INHALE 1 PUFF BY MOUTH 2 TIMES A DAY 60 each 10   albuterol (PROVENTIL) (2.5 MG/3ML) 0.083% nebulizer solution USE 1 UNIT DOSE(VIAL)VIA NEBULIZER 4 TIMES A DAY AS NEEDED AS DIRECTED 1080 mL 3   albuterol (VENTOLIN HFA) 108 (90 Base) MCG/ACT inhaler INHALE 2 PUFFS BY MOUTH EVERY 6 HRS AS NEEDED 54 g 10   amLODipine (NORVASC) 5 MG tablet TAKE 1 TABLET BY MOUTH IN THE MORNING AND AT BEDTIME 180 tablet 3   aspirin EC 81 MG tablet Take 1 tablet (81 mg total) by mouth daily. 90 tablet 3   atorvastatin (LIPITOR) 40 MG tablet ONE TABLET BY MOUTH DAILY 90 tablet 0   cyanocobalamin 1000 MCG tablet Take 1,000 mcg by mouth every Thursday.       cyclobenzaprine (FLEXERIL) 10 MG tablet Take 1 tablet (10 mg total) by mouth at bedtime. 30 tablet 0   diclofenac sodium (VOLTAREN) 1 % GEL APPLY 2 GRAM TO THE AFFECTED AREAS 4 TIMES A DAY AS NEEDED FOR KNEE PAIN  700 g 3   diphenoxylate-atropine (LOMOTIL) 2.5-0.025 MG tablet Take 1 tablet by mouth 4 (four) times daily as needed for diarrhea or loose stools.      docusate sodium (COLACE) 100 MG capsule Take 100  mg by mouth 2 (two) times daily as needed for mild constipation.     ezetimibe (ZETIA) 10 MG tablet ONE TABLET BY MOUTH DAILY 90 tablet 3   ferrous sulfate 325 (65 FE) MG tablet Take 325 mg by mouth 2 (two) times daily with a meal.      fluticasone (FLONASE) 50 MCG/ACT nasal spray Place 1 spray into both nostrils daily as needed for allergies or rhinitis.     folic acid (FOLVITE) 1 MG tablet TAKE 1 TABLET BY MOUTH DAILY 90 tablet 3   gabapentin (NEURONTIN) 600 MG tablet Take 600 mg by mouth 3 (three) times daily.     losartan (COZAAR) 50 MG tablet TAKE 1 TABLET BY MOUTH DAILY 90 tablet 3   metFORMIN (GLUCOPHAGE-XR) 500 MG 24 hr tablet Take 500 mg by mouth 2 (two) times daily.     montelukast (SINGULAIR) 10 MG tablet TAKE 1 TABLET BY MOUTH DAILY 90 tablet 3   pantoprazole (PROTONIX) 40 MG tablet ONE TABLET BY MOUTH DAILY 90 tablet 2   traZODone (DESYREL) 25 mg TABS tablet Take 25 mg by mouth at bedtime.     Vitamin D, Ergocalciferol, (DRISDOL) 1.25 MG (50000 UNIT) CAPS capsule TAKE 1 CAPSULE BY MOUTH WEEKLY 12 capsule 0   bumetanide (BUMEX) 2 MG tablet TAKE 1 TABLET BY MOUTH 2 TIMES A WEEK 24 tablet 3   No facility-administered medications prior to visit.    Review of Systems  Constitutional:  Negative for chills, fever, malaise/fatigue and weight loss.  HENT:  Negative for congestion, sinus pain and sore throat.   Eyes: Negative.   Respiratory:  Positive for cough, sputum production and shortness of breath. Negative for hemoptysis and wheezing.   Cardiovascular:  Negative for chest  pain, palpitations, orthopnea, claudication, leg swelling and PND.  Gastrointestinal:  Negative for abdominal pain, heartburn, nausea and vomiting.  Genitourinary: Negative.   Musculoskeletal: Negative.   Neurological:  Negative for dizziness, weakness and headaches.  Psychiatric/Behavioral: Negative.     Objective:   Vitals:   08/11/21 1333  BP: 120/74  Pulse: 76  SpO2: 91%  Weight: 215 lb (97.5 kg)  Height: 5\' 1"  (1.549 m)    Physical Exam Constitutional:      General: She is not in acute distress.    Appearance: She is obese.  HENT:     Head: Normocephalic and atraumatic.     Nose: Nose normal.     Mouth/Throat:     Mouth: Mucous membranes are moist.     Pharynx: Oropharynx is clear.  Eyes:     General: No scleral icterus.    Conjunctiva/sclera: Conjunctivae normal.     Pupils: Pupils are equal, round, and reactive to light.  Cardiovascular:     Rate and Rhythm: Normal rate and regular rhythm.     Pulses: Normal pulses.     Heart sounds: Normal heart sounds. No murmur heard. Pulmonary:     Effort: Pulmonary effort is normal.     Breath sounds: No wheezing, rhonchi or rales.  Musculoskeletal:     Cervical back: Neck supple.     Right lower leg: No edema.     Left lower leg: No edema.  Skin:    General: Skin is warm and dry.  Neurological:     General: No focal deficit present.     Mental Status: She is alert.    CBC    Component Value Date/Time   WBC 6.2 08/04/2021 1224   RBC 3.13 (L)  08/04/2021 1224   HGB 9.0 (L) 08/04/2021 1224   HGB 10.2 (L) 02/04/2021 1211   HGB 10.9 (L) 04/09/2019 1530   HGB 11.9 07/24/2017 1334   HCT 29.9 (L) 08/04/2021 1224   HCT 32.4 (L) 04/09/2019 1530   HCT 37.1 07/24/2017 1334   PLT 276 08/04/2021 1224   PLT 258 02/04/2021 1211   PLT 281 04/09/2019 1530   MCV 95.5 08/04/2021 1224   MCV 93 04/09/2019 1530   MCV 96.7 07/24/2017 1334   MCH 28.8 08/04/2021 1224   MCHC 30.1 08/04/2021 1224   RDW 14.8 08/04/2021 1224   RDW  13.3 04/09/2019 1530   RDW 14.6 (H) 07/24/2017 1334   LYMPHSABS 1.8 08/04/2021 1224   LYMPHSABS 1.8 07/24/2017 1334   MONOABS 0.3 08/04/2021 1224   MONOABS 0.4 07/24/2017 1334   EOSABS 0.2 08/04/2021 1224   EOSABS 0.2 07/24/2017 1334   BASOSABS 0.0 08/04/2021 1224   BASOSABS 0.0 07/24/2017 1334   BMP Latest Ref Rng & Units 08/04/2021 02/04/2021 07/26/2020  Glucose 70 - 99 mg/dL 485(I) 627(O) 350(K)  BUN 8 - 23 mg/dL 16 9 18   Creatinine 0.44 - 1.00 mg/dL ) 9.38(H) 8.29(H)  BUN/Creat Ratio 12 - 28 - - 11(L)  Sodium 135 - 145 mmol/L 144 145 143  Potassium 3.5 - 5.1 mmol/L 4.0 3.7 4.4  Chloride 98 - 111 mmol/L 102 103 100  CO2 22 - 32 mmol/L 30 31 26   Calcium 8.9 - 10.3 mg/dL 9.5 9.4 9.5   Chest imaging: CXR 09/02/2018 There is shallow lung inflation. The cardiomediastinal contours are normal.   There is no focal airspace consolidation or pulmonary edema. There is no pleural effusion or pneumothorax.  HRCT Chest 06/08/2016 1. Again noted is mild diffuse cylindrical bronchiectasis and thickening of the peribronchovascular interstitium. The subtle areas of ground-glass attenuation and septal thickening in the lungs persist compared to the prior study, but appear unchanged, and may indicate areas of nonspecific interstitial pneumonia (NSIP). 2. Today's study also demonstrates mild air trapping, indicative of small airways disease, and demonstrates evidence of tracheobronchomalacia. In retrospect, the prior expiratory phase images were unlikely to be truly expiratory on study 04/13/2015. 3. Aortic atherosclerosis, in addition to three-vessel coronary artery disease. Please note that although the presence of coronary artery calcium documents the presence of coronary artery disease, the severity of this disease and any potential stenosis cannot be assessed on this non-gated CT examination. Assessment for potential risk factor modification, dietary therapy or pharmacologic  therapy may be warranted, if clinically indicated. 4. Cardiomegaly with mild left atrial dilatation.  V/Q Scan 05/30/2015 No ventilation or perfusion defects identified. Very low probability of pulmonary embolus.  PFT: PFT Results Latest Ref Rng & Units 05/30/2016 04/16/2015  FVC-Pre L 1.16 1.18  FVC-Predicted Pre % 55 55  FVC-Post L 1.16 1.37  FVC-Predicted Post % 55 64  Pre FEV1/FVC % % 88 88  Post FEV1/FCV % % 89 88  FEV1-Pre L 1.02 1.04  FEV1-Predicted Pre % 63 62  FEV1-Post L 1.03 1.20  DLCO uncorrected ml/min/mmHg 13.80 7.13  DLCO UNC% % 68 35  DLCO corrected ml/min/mmHg 14.85 7.91  DLCO COR %Predicted % 73 39  DLVA Predicted % 147 85  TLC L 2.84 2.72  TLC % Predicted % 61 59  RV % Predicted % 74 71   Echo: 04/13/2015 - Left ventricle: The cavity size was normal. Wall thickness was    normal. Systolic function was normal. The estimated ejection  fraction was in the range of 60% to 65%. Wall motion was normal;    there were no regional wall motion abnormalities. Doppler    parameters are consistent with abnormal left ventricular    relaxation (grade 1 diastolic dysfunction).  - Left atrium: The atrium was mildly dilated.   Stress Test 01/31/2017 1. EF 66%, normal wall motion.  2. Fixed, small, mild mid anterior perfusion defect.  Given normal wall motion, suspect soft tissue attenuation rather than infarction.  No ischemia.  3. Low risk study.    Assessment & Plan:   Severe persistent asthma without complication - Plan: Tiotropium Bromide Monohydrate (SPIRIVA RESPIMAT) 2.5 MCG/ACT AERS  Bronchiectasis without complication (HCC) - Plan: Tiotropium Bromide Monohydrate (SPIRIVA RESPIMAT) 2.5 MCG/ACT AERS  Tracheobronchomalacia - Plan: Ambulatory Referral for DME  Class 3 severe obesity with serious comorbidity in adult, unspecified BMI, unspecified obesity type (HCC)  Discussion: Anita Gonzalez is a 75 year old woman, former smoker with chronic hypoxemic respiratory  failure on 2L O2 with ambulation, asthma, bronchiectasis, tracheobronchomalacia and obstructive sleep apnea who returns to pulmonary clinic for follow up.     Her asthma symptoms were better controlled on ICS/LAMA/LABA therapy with trelegy but she is unable to get this medication due to insurance issues. She has been usking advair diskus so we will add spiriva to her regimen.   I discussed with her that the cough and dyspnea with exertion is in part related to her tracheobronchomalacia.   She is not interested in CPAP therapy for her history of sleep apnea.   Follow up in 6 months  Melody Comas, MD [facility] Office: [phone]    Current Outpatient Medications:    ADVAIR DISKUS 500-50 MCG/ACT AEPB, INHALE 1 PUFF BY MOUTH 2 TIMES A DAY, Disp: 60 each, Rfl: 10   albuterol (PROVENTIL) (2.5 MG/3ML) 0.083% nebulizer solution, USE 1 UNIT DOSE(VIAL)VIA NEBULIZER 4 TIMES A DAY AS NEEDED AS DIRECTED, Disp: 1080 mL, Rfl: 3   albuterol (VENTOLIN HFA) 108 (90 Base) MCG/ACT inhaler, INHALE 2 PUFFS BY MOUTH EVERY 6 HRS AS NEEDED, Disp: 54 g, Rfl: 10   amLODipine (NORVASC) 5 MG tablet, TAKE 1 TABLET BY MOUTH IN THE MORNING AND AT BEDTIME, Disp: 180 tablet, Rfl: 3   aspirin EC 81 MG tablet, Take 1 tablet (81 mg total) by mouth daily., Disp: 90 tablet, Rfl: 3   atorvastatin (LIPITOR) 40 MG tablet, ONE TABLET BY MOUTH DAILY, Disp: 90 tablet, Rfl: 0   cyanocobalamin 1000 MCG tablet, Take 1,000 mcg by mouth every Thursday. , Disp: , Rfl:    cyclobenzaprine (FLEXERIL) 10 MG tablet, Take 1 tablet (10 mg total) by mouth at bedtime., Disp: 30 tablet, Rfl: 0   diclofenac sodium (VOLTAREN) 1 % GEL, APPLY 2 GRAM TO THE AFFECTED AREAS 4 TIMES A DAY AS NEEDED FOR KNEE PAIN , Disp: 700 g, Rfl: 3   diphenoxylate-atropine (LOMOTIL) 2.5-0.025 MG tablet, Take 1 tablet by mouth 4 (four) times daily as needed for diarrhea or loose stools. , Disp: , Rfl:    docusate sodium (COLACE) 100 MG capsule,  Take 100 mg by mouth 2 (two) times daily as needed for mild constipation., Disp: , Rfl:    ezetimibe (ZETIA) 10 MG tablet, ONE TABLET BY MOUTH DAILY, Disp: 90 tablet, Rfl: 3   ferrous sulfate 325 (65 FE) MG tablet, Take 325 mg by mouth 2 (two) times daily with a meal. , Disp: , Rfl:    fluticasone (FLONASE) 50 MCG/ACT  nasal spray, Place 1 spray into both nostrils daily as needed for allergies or rhinitis., Disp: , Rfl:    folic acid (FOLVITE) 1 MG tablet, TAKE 1 TABLET BY MOUTH DAILY, Disp: 90 tablet, Rfl: 3   gabapentin (NEURONTIN) 600 MG tablet, Take 600 mg by mouth 3 (three) times daily., Disp: , Rfl:    losartan (COZAAR) 50 MG tablet, TAKE 1 TABLET BY MOUTH DAILY, Disp: 90 tablet, Rfl: 3   metFORMIN (GLUCOPHAGE-XR) 500 MG 24 hr tablet, Take 500 mg by mouth 2 (two) times daily., Disp: , Rfl:    montelukast (SINGULAIR) 10 MG tablet, TAKE 1 TABLET BY MOUTH DAILY, Disp: 90 tablet, Rfl: 3   pantoprazole (PROTONIX) 40 MG tablet, ONE TABLET BY MOUTH DAILY, Disp: 90 tablet, Rfl: 2   Tiotropium Bromide Monohydrate (SPIRIVA RESPIMAT) 2.5 MCG/ACT AERS, Inhale 2 puffs into the lungs daily., Disp: 4 g, Rfl: 11   Tiotropium Bromide Monohydrate (SPIRIVA RESPIMAT) 2.5 MCG/ACT AERS, Inhale 2 puffs into the lungs daily., Disp: 8 g, Rfl: 0   traZODone (DESYREL) 25 mg TABS tablet, Take 25 mg by mouth at bedtime., Disp: , Rfl:    Vitamin D, Ergocalciferol, (DRISDOL) 1.25 MG (50000 UNIT) CAPS capsule, TAKE 1 CAPSULE BY MOUTH WEEKLY, Disp: 12 capsule, Rfl: 0   bumetanide (BUMEX) 2 MG tablet, TAKE 1 TABLET BY MOUTH 2 TIMES A WEEK, Disp: 24 tablet, Rfl: 3

## 2021-08-11 NOTE — Progress Notes (Signed)
Patient seen in the office today and instructed on use of Spiriva 2.67mcg.  Patient expressed understanding and demonstrated technique.  Anita Gonzalez [facility] 08/11/21

## 2021-08-12 ENCOUNTER — Encounter: Payer: Self-pay | Admitting: Pulmonary Disease

## 2021-08-15 ENCOUNTER — Encounter: Payer: Self-pay | Admitting: Hematology

## 2021-08-24 ENCOUNTER — Inpatient Hospital Stay: Payer: Medicare HMO | Attending: Hematology

## 2021-08-24 ENCOUNTER — Other Ambulatory Visit: Payer: Self-pay

## 2021-08-24 VITALS — BP 105/56 | HR 56 | Temp 98.8°F | Resp 19

## 2021-08-24 DIAGNOSIS — D500 Iron deficiency anemia secondary to blood loss (chronic): Secondary | ICD-10-CM | POA: Diagnosis not present

## 2021-08-24 DIAGNOSIS — Q2739 Arteriovenous malformation, other site: Secondary | ICD-10-CM | POA: Insufficient documentation

## 2021-08-24 DIAGNOSIS — N183 Chronic kidney disease, stage 3 unspecified: Secondary | ICD-10-CM | POA: Diagnosis not present

## 2021-08-24 MED ORDER — SODIUM CHLORIDE 0.9 % IV SOLN: Freq: Once | INTRAVENOUS | Status: AC

## 2021-08-24 MED ORDER — CYANOCOBALAMIN 1000 MCG/ML IJ SOLN
1000.0000 ug | Freq: Once | INTRAMUSCULAR | Status: AC
  Administered 2021-08-24: 1000 ug via INTRAMUSCULAR
  Filled 2021-08-24: qty 1

## 2021-08-24 MED ORDER — IRON SUCROSE 20 MG/ML IV SOLN
400.0000 mg | Freq: Once | INTRAVENOUS | Status: AC
  Administered 2021-08-24: 400 mg via INTRAVENOUS
  Filled 2021-08-24: qty 20

## 2021-08-24 MED ORDER — ACETAMINOPHEN 325 MG PO TABS
650.0000 mg | ORAL_TABLET | Freq: Once | ORAL | Status: AC
  Administered 2021-08-24: 650 mg via ORAL
  Filled 2021-08-24: qty 2

## 2021-08-24 MED ORDER — FAMOTIDINE 20 MG PO TABS
20.0000 mg | ORAL_TABLET | Freq: Once | ORAL | Status: AC
  Administered 2021-08-24: 20 mg via ORAL
  Filled 2021-08-24: qty 1

## 2021-08-24 MED ORDER — LORATADINE 10 MG PO TABS
10.0000 mg | ORAL_TABLET | Freq: Once | ORAL | Status: AC
  Administered 2021-08-24: 10 mg via ORAL
  Filled 2021-08-24: qty 1

## 2021-08-24 NOTE — Patient Instructions (Signed)

## 2021-08-25 ENCOUNTER — Telehealth: Payer: Self-pay | Admitting: Pulmonary Disease

## 2021-08-25 DIAGNOSIS — R0902 Hypoxemia: Secondary | ICD-10-CM | POA: Diagnosis not present

## 2021-08-25 DIAGNOSIS — R6889 Other general symptoms and signs: Secondary | ICD-10-CM | POA: Diagnosis not present

## 2021-08-25 DIAGNOSIS — K7402 Hepatic fibrosis, advanced fibrosis: Secondary | ICD-10-CM | POA: Diagnosis not present

## 2021-08-25 MED ORDER — AZITHROMYCIN 250 MG PO TABS: 250.0000 mg | ORAL_TABLET | ORAL | 0 refills | Status: DC

## 2021-08-25 MED ORDER — PREDNISONE 10 MG PO TABS: ORAL_TABLET | ORAL | 0 refills | Status: DC

## 2021-08-25 NOTE — Telephone Encounter (Signed)
I called and spoke with the pt  I notified of response per JD  She describes having a sensation that she has something in her throat, esp after eating sometimes  She takes protonix 40 mg daily  She is typically taking this after breakfast  I advised to try 30 min before she eats breakfast  She verbalized understanding  JD ok with this and states no other recs for now  She is to call if not improving

## 2021-08-25 NOTE — Telephone Encounter (Signed)
Spoke with the pt  She was seen here 08/11/21- Return in about 6 months (around 02/08/2022). Continue advair diskus 1 puff twice daily   Start spiriva respimat inhaler 2 puffs daily   Continue to use albuterol as needed.        She says her cough is still bothering her  She states not wheezing anymore and her SOB is at baseline  She is coughing up very minimal yellow sputum  Denies any f/c/s, aches  She is using her advair and spiriva as directed and albuterol inhaler 2-3 x per day  She rarely uses her neb  She is asking if something can be sent to help with cough Please advise, thank you! Allergies  Allergen Reactions   Fish Allergy Anaphylaxis, Hives, Swelling and Other (See Comments)   Peanuts [Peanut Oil] Anaphylaxis, Hives, Itching and Swelling   Penicillins Hives, Itching, Swelling and Other (See Comments)    PATIENT HAS HAD A PCN REACTION WITH IMMEDIATE RASH, FACIAL/TONGUE/THROAT SWELLING, SOB, OR LIGHTHEADEDNESS WITH HYPOTENSION:  #  #  YES  #  #  HAS PT DEVELOPED SEVERE RASH INVOLVING MUCUS MEMBRANES or SKIN NECROSIS: #  #  YES  #  # PATIENT HAS HAD A PCN REACTION THAT REQUIRED HOSPITALIZATION:  #  #  YES  #  #  Has patient had a PCN reaction occurring within the last 10 years: No    Iodinated Diagnostic Agents Hives, Itching and Swelling    SWELLING REACTION UNSPECIFIED    Iodine Hives, Itching and Swelling    Patient reports receiving IV dye in abdominal CT scan without issue   Percocet [Oxycodone-Acetaminophen] Nausea Only, Anxiety and Other (See Comments)    Raised blood pressure, Sweats, nervous,dizziness   2,4-D Dimethylamine (Amisol) Other (See Comments)   Peanut (Diagnostic) Other (See Comments)   Peanut Allergen Powder-Dnfp    Hydrocodone Itching and Nausea Only   Other Other (See Comments)    UNSPECIFIED REACTION  Hypersensitive to perfumes, colognes, powders, lotions, room sprays   Oxycodone Itching, Nausea Only and Other (See Comments)    Sweats and  dizziness   Sulfa Antibiotics     UNSPECIFIED REACTION

## 2021-08-25 NOTE — Telephone Encounter (Signed)
Please send in steroid taper:  30mg  x 3 days 20mg  x 3 days 10mg  x 3 days  Along with Zpak.  Is she having trouble with GERD or sinus drainage?  Thanks, 

## 2021-08-26 ENCOUNTER — Other Ambulatory Visit: Payer: Self-pay | Admitting: Nurse Practitioner

## 2021-08-26 DIAGNOSIS — K7402 Hepatic fibrosis, advanced fibrosis: Secondary | ICD-10-CM

## 2021-08-30 ENCOUNTER — Telehealth: Payer: Self-pay | Admitting: Pulmonary Disease

## 2021-08-30 NOTE — Telephone Encounter (Signed)
I called and spoke with pt and she stated that she would be willing to change all of her equipment to another DME if she can get the smaller POC.  She stated that the one that she has now she cannot carry it so she leaves it when she goes out.  Could we change her over to a new DME?  She said that she has been with [employer] for years and she has an emergency tank, rollator machine and oximizer.

## 2021-08-30 NOTE — Telephone Encounter (Signed)
PCC's  can we send the order for the POC to another DME company?  Pt is requesting that we do this.  Thanks

## 2021-08-30 NOTE — Telephone Encounter (Signed)
If she is with another company for her oxygen a new Dme will not take her for just a POC

## 2021-08-31 ENCOUNTER — Inpatient Hospital Stay: Payer: Medicare HMO

## 2021-09-01 ENCOUNTER — Telehealth: Payer: Self-pay | Admitting: Hematology

## 2021-09-01 NOTE — Telephone Encounter (Signed)
I have called the pt and she is aware of this.  She will check with [employer] to see how long she has been with them.

## 2021-09-01 NOTE — Telephone Encounter (Signed)
FOR O2/POC:  If that patient has been with a DME for more then 30 days & less than 5 years, the patient is not able to switch DMEs unless the patient would like to pay out of pocket.

## 2021-09-01 NOTE — Telephone Encounter (Signed)
Rescheduled missed appointment. Patient is aware of changes.

## 2021-09-02 DIAGNOSIS — J449 Chronic obstructive pulmonary disease, unspecified: Secondary | ICD-10-CM | POA: Diagnosis not present

## 2021-09-05 ENCOUNTER — Other Ambulatory Visit: Payer: Self-pay | Admitting: Physician Assistant

## 2021-09-05 ENCOUNTER — Other Ambulatory Visit: Payer: Self-pay | Admitting: Internal Medicine

## 2021-09-05 DIAGNOSIS — Z1231 Encounter for screening mammogram for malignant neoplasm of breast: Secondary | ICD-10-CM

## 2021-09-06 ENCOUNTER — Other Ambulatory Visit: Payer: Medicare HMO

## 2021-09-07 ENCOUNTER — Inpatient Hospital Stay: Payer: Medicare HMO

## 2021-09-07 ENCOUNTER — Other Ambulatory Visit: Payer: Self-pay

## 2021-09-07 VITALS — BP 146/57 | HR 57 | Temp 98.4°F | Resp 16

## 2021-09-07 DIAGNOSIS — D500 Iron deficiency anemia secondary to blood loss (chronic): Secondary | ICD-10-CM | POA: Diagnosis not present

## 2021-09-07 DIAGNOSIS — N183 Chronic kidney disease, stage 3 unspecified: Secondary | ICD-10-CM | POA: Diagnosis not present

## 2021-09-07 DIAGNOSIS — Q2739 Arteriovenous malformation, other site: Secondary | ICD-10-CM | POA: Diagnosis not present

## 2021-09-07 MED ORDER — LORATADINE 10 MG PO TABS
10.0000 mg | ORAL_TABLET | Freq: Once | ORAL | Status: AC
  Administered 2021-09-07: 10 mg via ORAL
  Filled 2021-09-07: qty 1

## 2021-09-07 MED ORDER — SODIUM CHLORIDE 0.9 % IV SOLN
400.0000 mg | Freq: Once | INTRAVENOUS | Status: AC
  Administered 2021-09-07: 400 mg via INTRAVENOUS
  Filled 2021-09-07: qty 20

## 2021-09-07 MED ORDER — ACETAMINOPHEN 325 MG PO TABS
650.0000 mg | ORAL_TABLET | Freq: Once | ORAL | Status: AC
  Administered 2021-09-07: 650 mg via ORAL
  Filled 2021-09-07: qty 2

## 2021-09-07 MED ORDER — FAMOTIDINE 20 MG PO TABS
20.0000 mg | ORAL_TABLET | Freq: Once | ORAL | Status: AC
  Administered 2021-09-07: 20 mg via ORAL
  Filled 2021-09-07: qty 1

## 2021-09-07 MED ORDER — SODIUM CHLORIDE 0.9 % IV SOLN: Freq: Once | INTRAVENOUS | Status: AC

## 2021-09-07 NOTE — Patient Instructions (Signed)

## 2021-09-08 ENCOUNTER — Other Ambulatory Visit: Payer: Self-pay

## 2021-09-08 ENCOUNTER — Telehealth: Payer: Self-pay

## 2021-09-08 ENCOUNTER — Other Ambulatory Visit: Payer: Medicare HMO

## 2021-09-08 NOTE — Telephone Encounter (Signed)
Pt called stating she's not feeling any different from getting the iron infusions.  Pt is questioning should she continue to get iron transfusions since they are not improving her fatigue.  Pt wants to know if there is anything else she can take or do to help restore her iron.  Pt confirmed that she's taking oral B12 along with getting B12 injections.

## 2021-09-20 ENCOUNTER — Other Ambulatory Visit: Payer: Medicare HMO

## 2021-09-20 ENCOUNTER — Telehealth: Payer: Self-pay | Admitting: Pulmonary Disease

## 2021-09-20 NOTE — Telephone Encounter (Signed)
I have attempted to call the pt back but the line keeps hanging up on me.

## 2021-09-20 NOTE — Telephone Encounter (Signed)
I have attempted to call the pt but the line just rings and then hangs up.  Will try back later.

## 2021-09-24 DIAGNOSIS — J209 Acute bronchitis, unspecified: Secondary | ICD-10-CM | POA: Diagnosis not present

## 2021-09-24 DIAGNOSIS — M545 Low back pain, unspecified: Secondary | ICD-10-CM | POA: Diagnosis not present

## 2021-09-24 DIAGNOSIS — R8290 Unspecified abnormal findings in urine: Secondary | ICD-10-CM | POA: Diagnosis not present

## 2021-09-24 DIAGNOSIS — J320 Chronic maxillary sinusitis: Secondary | ICD-10-CM | POA: Diagnosis not present

## 2021-09-27 ENCOUNTER — Ambulatory Visit (HOSPITAL_BASED_OUTPATIENT_CLINIC_OR_DEPARTMENT_OTHER): Payer: Medicare HMO | Admitting: Nurse Practitioner

## 2021-09-27 DIAGNOSIS — E559 Vitamin D deficiency, unspecified: Secondary | ICD-10-CM | POA: Insufficient documentation

## 2021-09-27 DIAGNOSIS — G63 Polyneuropathy in diseases classified elsewhere: Secondary | ICD-10-CM | POA: Insufficient documentation

## 2021-09-27 DIAGNOSIS — E1121 Type 2 diabetes mellitus with diabetic nephropathy: Secondary | ICD-10-CM | POA: Insufficient documentation

## 2021-09-27 DIAGNOSIS — E611 Iron deficiency: Secondary | ICD-10-CM | POA: Insufficient documentation

## 2021-09-27 DIAGNOSIS — M109 Gout, unspecified: Secondary | ICD-10-CM | POA: Insufficient documentation

## 2021-09-27 DIAGNOSIS — E669 Obesity, unspecified: Secondary | ICD-10-CM | POA: Insufficient documentation

## 2021-09-27 DIAGNOSIS — E1142 Type 2 diabetes mellitus with diabetic polyneuropathy: Secondary | ICD-10-CM | POA: Insufficient documentation

## 2021-09-27 DIAGNOSIS — I119 Hypertensive heart disease without heart failure: Secondary | ICD-10-CM | POA: Insufficient documentation

## 2021-09-27 DIAGNOSIS — G629 Polyneuropathy, unspecified: Secondary | ICD-10-CM | POA: Insufficient documentation

## 2021-09-27 DIAGNOSIS — J302 Other seasonal allergic rhinitis: Secondary | ICD-10-CM | POA: Insufficient documentation

## 2021-09-27 DIAGNOSIS — Z9981 Dependence on supplemental oxygen: Secondary | ICD-10-CM | POA: Insufficient documentation

## 2021-09-27 DIAGNOSIS — R1013 Epigastric pain: Secondary | ICD-10-CM | POA: Insufficient documentation

## 2021-09-27 DIAGNOSIS — J4540 Moderate persistent asthma, uncomplicated: Secondary | ICD-10-CM | POA: Insufficient documentation

## 2021-09-27 NOTE — Progress Notes (Deleted)
Tollie Eth, DNP, AGNP-c [facility] [address]  [address] [place], [place] [address] [phone]  New patient visit   Patient: Anita Gonzalez   DOB: October 29, 1946   75 y.o. Female  MRN: [id number] Visit Date: 09/27/2021  Patient Care Team: [name], MD as PCP - General (Internal Medicine) Pricilla Riffle, MD as PCP - Cardiology (Cardiology) Jearld Lesch, MD as Referring Physician (Specialist) Jearld Lesch, MD as Referring Physician (Specialist) Jeani Hawking, MD as Consulting Physician (Gastroenterology) Clyde Lundborg., MD as Referring Physician (Anesthesiology) Larey Dresser, DPM as Consulting Physician (Podiatry)  Today's healthcare provider: Tollie Eth, NP   No chief complaint on file.  Subjective    Anita Gonzalez is a 75 y.o. female who presents today as a new patient to establish care.  HPI  ***  Past Medical History:  Diagnosis Date   Acute kidney injury (HCC) 01/11/2015   Alcohol abuse, in remission    Anemia    Anemia, iron deficiency 01/28/2015   Arthritis    Asthma    Bronchiectasis (HCC) 03/07/2016   Bronchitis    COPD (chronic obstructive pulmonary disease) (HCC)    Diabetes mellitus without complication (HCC)    type 2   DM II (diabetes mellitus, type II), controlled (HCC) 01/06/2015   Dyspnea and respiratory abnormality 04/06/2015   Dysrhythmia    hx of palpitations   E-coli UTI 01/11/2015   Essential hypertension 01/06/2015   GERD (gastroesophageal reflux disease)    Hay fever    Hepatitis    c treated with pills   HLD (hyperlipidemia) 01/06/2015   Hypertension    IBS (irritable bowel syndrome)    ILD (interstitial lung disease) (HCC) 12/11/2016   Leg cramps, sleep related 12/01/2015   Pneumonia    Rheumatoid arthritis (HCC) 1996   Sinus tachycardia 01/11/2015   LT monitor 01/2019: normal sinus rhythm, occ short bursts of PAT, accelerated junctional rhythm, no sig  arrhythmias.    Past Surgical History:  Procedure Laterality Date   ABDOMINAL HYSTERECTOMY     BALLOON DILATION N/A 03/15/2018   Procedure: BALLOON DILATION;  Surgeon: Jeani Hawking, MD;  Location: [facility];  Service: Endoscopy;  Laterality: N/A;   CARPAL TUNNEL RELEASE Left 04/19/2018   Procedure: CARPAL TUNNEL RELEASE;  Surgeon: Coletta Memos, MD;  Location: [facility];  Service: Neurosurgery;  Laterality: Left;  CARPAL TUNNEL RELEASE   CESAREAN SECTION     COLONOSCOPY WITH PROPOFOL N/A 04/02/2015   Procedure: COLONOSCOPY WITH PROPOFOL;  Surgeon: Jeani Hawking, MD;  Location: [facility];  Service: Endoscopy;  Laterality: N/A;   ESOPHAGOGASTRODUODENOSCOPY (EGD) WITH PROPOFOL N/A 04/02/2015   Procedure: ESOPHAGOGASTRODUODENOSCOPY (EGD) WITH PROPOFOL;  Surgeon: Jeani Hawking, MD;  Location: [facility];  Service: Endoscopy;  Laterality: N/A;   ESOPHAGOGASTRODUODENOSCOPY (EGD) WITH PROPOFOL N/A 03/15/2018   Procedure: ESOPHAGOGASTRODUODENOSCOPY (EGD) WITH PROPOFOL;  Surgeon: Jeani Hawking, MD;  Location: [facility];  Service: Endoscopy;  Laterality: N/A;   HEMORRHOID SURGERY     HERNIA REPAIR     HERNIA REPAIR     Family Status  Relation Name Status   Mother  Deceased   Father  Deceased   Brother  (Not Specified)   MGM  Deceased   MGF  Deceased   PGM  Deceased   PGF  Deceased   Other  (Not Specified)   Family History  Problem Relation Age of Onset   Diabetes Mother    Heart failure  Mother    Throat cancer Brother        throat cancer    Asthma Other    COPD Other    Hypertension Other    Stroke Other    Heart disease Other    Social History   Socioeconomic History   Marital status: Single    Spouse name: Not on file   Number of children: Not on file   Years of education: Not on file   Highest education level: Not on file  Occupational History   Occupation: retired  Tobacco Use   Smoking status: Former    Packs/day: 0.75    Years: 25.00    Pack years: 18.75    Types:  Cigarettes    Quit date: 01/12/2011    Years since quitting: 10.7   Smokeless tobacco: Never  Vaping Use   Vaping Use: Never used  Substance and Sexual Activity   Alcohol use: Yes    Alcohol/week: 4.0 standard drinks    Types: 4 Cans of beer per week    Comment: heavy drinker in the past   Drug use: No   Sexual activity: Not Currently  Other Topics Concern   Not on file  Social History Narrative   Not on file   Social Determinants of Health   Financial Resource Strain: Not on file  Food Insecurity: Not on file  Transportation Needs: Not on file  Physical Activity: Not on file  Stress: Not on file  Social Connections: Not on file   Outpatient Medications Prior to Visit  Medication Sig   ADVAIR DISKUS 500-50 MCG/ACT AEPB INHALE 1 PUFF BY MOUTH 2 TIMES A DAY   albuterol (PROVENTIL) (2.5 MG/3ML) 0.083% nebulizer solution USE 1 UNIT DOSE(VIAL)VIA NEBULIZER 4 TIMES A DAY AS NEEDED AS DIRECTED   albuterol (VENTOLIN HFA) 108 (90 Base) MCG/ACT inhaler INHALE 2 PUFFS BY MOUTH EVERY 6 HRS AS NEEDED   amLODipine (NORVASC) 5 MG tablet TAKE 1 TABLET BY MOUTH IN THE MORNING AND AT BEDTIME   aspirin EC 81 MG tablet Take 1 tablet (81 mg total) by mouth daily.   atorvastatin (LIPITOR) 40 MG tablet ONE TABLET BY MOUTH DAILY   azithromycin (ZITHROMAX) 250 MG tablet Take 1 tablet (250 mg total) by mouth as directed.   bumetanide (BUMEX) 2 MG tablet TAKE 1 TABLET BY MOUTH 2 TIMES A WEEK   cyanocobalamin 1000 MCG tablet Take 1,000 mcg by mouth every Thursday.    cyclobenzaprine (FLEXERIL) 10 MG tablet Take 1 tablet (10 mg total) by mouth at bedtime.   diclofenac sodium (VOLTAREN) 1 % GEL APPLY 2 GRAM TO THE AFFECTED AREAS 4 TIMES A DAY AS NEEDED FOR KNEE PAIN    diphenoxylate-atropine (LOMOTIL) 2.5-0.025 MG tablet Take 1 tablet by mouth 4 (four) times daily as needed for diarrhea or loose stools.    docusate sodium (COLACE) 100 MG capsule Take 100 mg by mouth 2 (two) times daily as needed for mild  constipation.   ezetimibe (ZETIA) 10 MG tablet ONE TABLET BY MOUTH DAILY   ferrous sulfate 325 (65 FE) MG tablet Take 325 mg by mouth 2 (two) times daily with a meal.    fluticasone (FLONASE) 50 MCG/ACT nasal spray Place 1 spray into both nostrils daily as needed for allergies or rhinitis.   folic acid (FOLVITE) 1 MG tablet TAKE 1 TABLET BY MOUTH DAILY   gabapentin (NEURONTIN) 600 MG tablet Take 600 mg by mouth 3 (three) times daily.   losartan (COZAAR) 50  MG tablet TAKE 1 TABLET BY MOUTH DAILY   metFORMIN (GLUCOPHAGE-XR) 500 MG 24 hr tablet Take 500 mg by mouth 2 (two) times daily.   montelukast (SINGULAIR) 10 MG tablet TAKE 1 TABLET BY MOUTH DAILY   pantoprazole (PROTONIX) 40 MG tablet ONE TABLET BY MOUTH DAILY   predniSONE (DELTASONE) 10 MG tablet 3 x 3 days, 2 x 3 days, 1 x 3 days then stop   Tiotropium Bromide Monohydrate (SPIRIVA RESPIMAT) 2.5 MCG/ACT AERS Inhale 2 puffs into the lungs daily.   Tiotropium Bromide Monohydrate (SPIRIVA RESPIMAT) 2.5 MCG/ACT AERS Inhale 2 puffs into the lungs daily.   traZODone (DESYREL) 25 mg TABS tablet Take 25 mg by mouth at bedtime.   Vitamin D, Ergocalciferol, (DRISDOL) 1.25 MG (50000 UNIT) CAPS capsule TAKE 1 CAPSULE BY MOUTH WEEKLY   No facility-administered medications prior to visit.   Allergies  Allergen Reactions   Fish Allergy Anaphylaxis, Hives, Swelling and Other (See Comments)   Peanuts [Peanut Oil] Anaphylaxis, Hives, Itching and Swelling   Penicillins Hives, Itching, Swelling and Other (See Comments)    PATIENT HAS HAD A PCN REACTION WITH IMMEDIATE RASH, FACIAL/TONGUE/THROAT SWELLING, SOB, OR LIGHTHEADEDNESS WITH HYPOTENSION:  #  #  YES  #  #  HAS PT DEVELOPED SEVERE RASH INVOLVING MUCUS MEMBRANES or SKIN NECROSIS: #  #  YES  #  # PATIENT HAS HAD A PCN REACTION THAT REQUIRED HOSPITALIZATION:  #  #  YES  #  #  Has patient had a PCN reaction occurring within the last 10 years: No    Iodinated Diagnostic Agents Hives, Itching and Swelling     SWELLING REACTION UNSPECIFIED    Iodine Hives, Itching and Swelling    Patient reports receiving IV dye in abdominal CT scan without issue   Percocet [Oxycodone-Acetaminophen] Nausea Only, Anxiety and Other (See Comments)    Raised blood pressure, Sweats, nervous,dizziness   2,4-D Dimethylamine (Amisol) Other (See Comments)   Peanut (Diagnostic) Other (See Comments)   Peanut Allergen Powder-Dnfp    Hydrocodone Itching and Nausea Only   Other Other (See Comments)    UNSPECIFIED REACTION  Hypersensitive to perfumes, colognes, powders, lotions, room sprays   Oxycodone Itching, Nausea Only and Other (See Comments)    Sweats and dizziness   Sulfa Antibiotics     UNSPECIFIED REACTION     Immunization History  Administered Date(s) Administered   Influenza Split 09/15/2015   Influenza, High Dose Seasonal PF 09/13/2020   PFIZER(Purple Top)SARS-COV-2 Vaccination 02/28/2020, 03/20/2020, 09/22/2020    Health Maintenance  Topic Date Due   HEMOGLOBIN A1C  Never done   Pneumonia Vaccine 65+ Years old (1 - PCV) Never done   FOOT EXAM  Never done   OPHTHALMOLOGY EXAM  Never done   Hepatitis C Screening  Never done   TETANUS/TDAP  Never done   Zoster Vaccines- Shingrix (1 of 2) Never done   MAMMOGRAM  08/23/2020   COVID-19 Vaccine (4 - Booster for Pfizer series) 11/17/2020   INFLUENZA VACCINE  06/13/2021   COLONOSCOPY (Pts 45-36yrs Insurance coverage will need to be confirmed)  04/01/2025   DEXA SCAN  Completed   HPV VACCINES  Aged Out    Patient Care Team: [name], MD as PCP - General (Internal Medicine) Pricilla Riffle, MD as PCP - Cardiology (Cardiology) Jearld Lesch, MD as Referring Physician (Specialist) Jearld Lesch, MD as Referring Physician (Specialist) Jeani Hawking, MD as Consulting Physician (Gastroenterology) Clyde Lundborg., MD as Referring Physician (  Anesthesiology) Larey Dresser, DPM as Consulting Physician (Podiatry)  Review of  Systems All review of systems negative except what is listed in the HPI    Objective    There were no vitals taken for this visit. Physical Exam ***  Depression Screen PHQ 2/9 Scores 06/25/2017 05/09/2017  PHQ - 2 Score 0 0   No results found for any visits on 09/27/21.  Assessment & Plan      Problem List Items Addressed This Visit   None    No follow-ups on file.      Avital Dancy, Sung Amabile, NP, DNP, AGNP-C [facility] at [facility]

## 2021-09-28 ENCOUNTER — Other Ambulatory Visit: Payer: Self-pay | Admitting: Hematology

## 2021-09-28 ENCOUNTER — Inpatient Hospital Stay: Payer: Medicaid Other

## 2021-09-28 ENCOUNTER — Inpatient Hospital Stay: Payer: Medicaid Other | Attending: Hematology

## 2021-09-28 ENCOUNTER — Telehealth: Payer: Self-pay

## 2021-09-28 MED ORDER — CYCLOBENZAPRINE HCL 10 MG PO TABS: 10.0000 mg | ORAL_TABLET | Freq: Every day | ORAL | 0 refills | Status: DC

## 2021-09-28 NOTE — Telephone Encounter (Signed)
Spoke with pt regarding refills for her cyclobenzaprine.  Dr. Mosetta Putt refilled the prescription for 1 month but pt will need to follow up with her PCP for future refills.  Pt verbalized understanding of instructions.

## 2021-09-29 DIAGNOSIS — E1129 Type 2 diabetes mellitus with other diabetic kidney complication: Secondary | ICD-10-CM | POA: Diagnosis not present

## 2021-09-29 DIAGNOSIS — J209 Acute bronchitis, unspecified: Secondary | ICD-10-CM | POA: Diagnosis not present

## 2021-09-29 DIAGNOSIS — I119 Hypertensive heart disease without heart failure: Secondary | ICD-10-CM | POA: Diagnosis not present

## 2021-09-29 DIAGNOSIS — E782 Mixed hyperlipidemia: Secondary | ICD-10-CM | POA: Diagnosis not present

## 2021-09-29 NOTE — Telephone Encounter (Signed)
Called and spoke to pt. Walked pt through assembling, priming, and act of taking the inhaler. Pt verbalized understanding and is aware to call us back if she has any questions.

## 2021-10-03 DIAGNOSIS — J449 Chronic obstructive pulmonary disease, unspecified: Secondary | ICD-10-CM | POA: Diagnosis not present

## 2021-10-04 ENCOUNTER — Other Ambulatory Visit: Payer: Medicare HMO

## 2021-10-04 ENCOUNTER — Inpatient Hospital Stay: Payer: Medicaid Other

## 2021-10-11 ENCOUNTER — Ambulatory Visit: Payer: Medicare HMO

## 2021-10-18 DIAGNOSIS — Z743 Need for continuous supervision: Secondary | ICD-10-CM | POA: Diagnosis not present

## 2021-10-18 DIAGNOSIS — R531 Weakness: Secondary | ICD-10-CM | POA: Diagnosis not present

## 2021-10-18 DIAGNOSIS — R251 Tremor, unspecified: Secondary | ICD-10-CM | POA: Diagnosis not present

## 2021-10-18 DIAGNOSIS — R202 Paresthesia of skin: Secondary | ICD-10-CM | POA: Diagnosis not present

## 2021-10-24 ENCOUNTER — Other Ambulatory Visit: Payer: Self-pay | Admitting: Internal Medicine

## 2021-10-24 DIAGNOSIS — Z1231 Encounter for screening mammogram for malignant neoplasm of breast: Secondary | ICD-10-CM

## 2021-10-26 ENCOUNTER — Inpatient Hospital Stay: Payer: Medicaid Other | Attending: Hematology

## 2021-11-02 DIAGNOSIS — J449 Chronic obstructive pulmonary disease, unspecified: Secondary | ICD-10-CM | POA: Diagnosis not present

## 2021-11-03 ENCOUNTER — Ambulatory Visit (HOSPITAL_BASED_OUTPATIENT_CLINIC_OR_DEPARTMENT_OTHER): Payer: Medicare HMO | Admitting: Nurse Practitioner

## 2021-11-11 ENCOUNTER — Encounter (HOSPITAL_BASED_OUTPATIENT_CLINIC_OR_DEPARTMENT_OTHER): Payer: Self-pay | Admitting: Nurse Practitioner

## 2021-11-11 ENCOUNTER — Other Ambulatory Visit: Payer: Medicare HMO

## 2021-11-11 DIAGNOSIS — R6889 Other general symptoms and signs: Secondary | ICD-10-CM | POA: Diagnosis not present

## 2021-11-16 ENCOUNTER — Other Ambulatory Visit: Payer: Medicare HMO

## 2021-11-18 ENCOUNTER — Telehealth: Payer: Self-pay | Admitting: Pulmonary Disease

## 2021-11-18 NOTE — Telephone Encounter (Signed)
Called and spoke with patient. She wanted to know if it would be ok for her to use Mucinex Max in addition to all of her other medications. I reviewed her medications to make sure nothing new had been added. Spoke with JD and he stated that he was ok with her using Mucinex.   She is aware of this and will start tonight. She will call back if this is not working.   Nothing further needed at time of call.

## 2021-11-23 ENCOUNTER — Other Ambulatory Visit: Payer: Medicare HMO

## 2021-11-23 ENCOUNTER — Other Ambulatory Visit: Payer: Self-pay | Admitting: Hematology

## 2021-11-24 ENCOUNTER — Ambulatory Visit
Admission: RE | Admit: 2021-11-24 | Discharge: 2021-11-24 | Disposition: A | Discharge status: Home / Self Care | Payer: Medicare HMO | Source: Ambulatory Visit | Attending: Internal Medicine | Admitting: Internal Medicine

## 2021-11-24 ENCOUNTER — Other Ambulatory Visit: Payer: Self-pay | Admitting: Internal Medicine

## 2021-11-24 DIAGNOSIS — Z1231 Encounter for screening mammogram for malignant neoplasm of breast: Secondary | ICD-10-CM

## 2021-11-24 DIAGNOSIS — R6889 Other general symptoms and signs: Secondary | ICD-10-CM | POA: Diagnosis not present

## 2021-11-25 ENCOUNTER — Other Ambulatory Visit: Payer: Self-pay | Admitting: Internal Medicine

## 2021-11-25 ENCOUNTER — Other Ambulatory Visit: Payer: Self-pay | Admitting: Hematology

## 2021-11-28 DIAGNOSIS — M545 Low back pain, unspecified: Secondary | ICD-10-CM | POA: Diagnosis not present

## 2021-11-28 DIAGNOSIS — M25559 Pain in unspecified hip: Secondary | ICD-10-CM | POA: Diagnosis not present

## 2021-11-28 DIAGNOSIS — M25551 Pain in right hip: Secondary | ICD-10-CM | POA: Diagnosis not present

## 2021-12-01 ENCOUNTER — Other Ambulatory Visit: Payer: Self-pay

## 2021-12-01 DIAGNOSIS — D500 Iron deficiency anemia secondary to blood loss (chronic): Secondary | ICD-10-CM

## 2021-12-01 NOTE — Progress Notes (Signed)
[facility] OFFICE PROGRESS NOTE  Benito Mccreedy, MD [id number] [place] [address]  CHIEF COMPLAINT: f/u of iron deficient anemia   CURRENT THERAPY:  -Oral iron Ferous sulfate BID, Folic Acid, oral 123456 [id number] reported to be weekly.  -IV Feraheme as needed, last in 02/2021.   INTERVAL HISTORY: Mykaela Chaplain Wren 76 y.o. female returns to clinic today for follow-up visit.  She was last seen in clinic 4 months ago.  She presents the clinic with her grandson.  The patient is feeling similar today.  She continues to have fatigue.  She states that her primary care provider is out of network and she needs a new provider.  She states she got a list the names yesterday for other practices.  She would be open to me referring her to the internal medicine residency clinic.  The patient has COPD and interstitial lung disease and she is followed by pulmonary medicine.  She continues to have baseline dyspnea on exertion which is unchanged.  She has intermittent lightheadedness.  She denies any abnormal bleeding or bruising including epistaxis, gingival bleeding, hemoptysis, hematemesis, melena, or hematochezia.  She is currently taking her ferrous sulfate twice daily and is compliant with this.  She is taking folic acid daily.  She also is taking vitamin B12 weekly.  She was scheduled for monthly B12 injection in the interval since her last appointment in September but has not shown up to any of them.  She is here today for evaluation and repeat blood work.   MEDICAL HISTORY: Past Medical History:  Diagnosis Date   Acute kidney injury ([place]) 01/11/2015   Alcohol abuse, in remission    Anemia    Anemia, iron deficiency 01/28/2015   Arthritis    Asthma    Bronchiectasis ([place]) 03/07/2016   Bronchitis    COPD (chronic obstructive pulmonary disease) ([place])    Diabetes mellitus without complication ([place])    type 2   DM II (diabetes mellitus, type II), controlled ([place]) 01/06/2015    Dyspnea and respiratory abnormality 04/06/2015   Dysrhythmia    hx of palpitations   E-coli UTI 01/11/2015   Essential hypertension 01/06/2015   GERD (gastroesophageal reflux disease)    Hay fever    Hepatitis    c treated with pills   HLD (hyperlipidemia) 01/06/2015   Hypertension    IBS (irritable bowel syndrome)    ILD (interstitial lung disease) ([place]) 12/11/2016   Leg cramps, sleep related 12/01/2015   Pneumonia    Rheumatoid arthritis ([place]) 1996   Sinus tachycardia 01/11/2015   LT monitor 01/2019: normal sinus rhythm, occ short bursts of PAT, accelerated junctional rhythm, no sig arrhythmias.     ALLERGIES:  is allergic to fish allergy; peanuts [peanut oil]; penicillins; iodinated contrast media; iodine; percocet [oxycodone-acetaminophen]; 2,4-d dimethylamine (amisol); peanut (diagnostic); peanut allergen powder-dnfp; hydrocodone; other; oxycodone; and sulfa antibiotics.  MEDICATIONS:  Current Outpatient Medications  Medication Sig Dispense Refill   ADVAIR DISKUS 500-50 MCG/ACT AEPB INHALE 1 PUFF BY MOUTH 2 TIMES A DAY 60 each 10   albuterol (PROVENTIL) (2.5 MG/3ML) 0.083% nebulizer solution USE 1 UNIT DOSE(VIAL)VIA NEBULIZER 4 TIMES A DAY AS NEEDED AS DIRECTED 1080 mL 3   albuterol (VENTOLIN HFA) 108 (90 Base) MCG/ACT inhaler INHALE 2 PUFFS BY MOUTH EVERY 6 HRS AS NEEDED 54 g 10   amLODipine (NORVASC) 5 MG tablet TAKE 1 TABLET BY MOUTH IN THE MORNING AND AT BEDTIME 180 tablet 3   aspirin  EC 81 MG tablet Take 1 tablet (81 mg total) by mouth daily. 90 tablet 3   atorvastatin (LIPITOR) 40 MG tablet ONE TABLET BY MOUTH DAILY 90 tablet 0   azithromycin (ZITHROMAX) 250 MG tablet Take 1 tablet (250 mg total) by mouth as directed. 6 tablet 0   bumetanide (BUMEX) 2 MG tablet TAKE 1 TABLET BY MOUTH 2 TIMES A WEEK 24 tablet 3   cyanocobalamin 1000 MCG tablet Take 1,000 mcg by mouth every Thursday.      cyclobenzaprine (FLEXERIL) 10 MG tablet TAKE ONE TABLET BY MOUTH AT BEDTIME 30 tablet 10    diclofenac sodium (VOLTAREN) 1 % GEL APPLY 2 GRAM TO THE AFFECTED AREAS 4 TIMES A DAY AS NEEDED FOR KNEE PAIN  700 g 3   diphenoxylate-atropine (LOMOTIL) 2.5-0.025 MG tablet Take 1 tablet by mouth 4 (four) times daily as needed for diarrhea or loose stools.      docusate sodium (COLACE) 100 MG capsule Take 100 mg by mouth 2 (two) times daily as needed for mild constipation.     ezetimibe (ZETIA) 10 MG tablet ONE TABLET BY MOUTH DAILY 90 tablet 2   ferrous sulfate 325 (65 FE) MG tablet Take 325 mg by mouth 2 (two) times daily with a meal.      fluticasone (FLONASE) 50 MCG/ACT nasal spray Place 1 spray into both nostrils daily as needed for allergies or rhinitis.     folic acid (FOLVITE) 1 MG tablet TAKE 1 TABLET BY MOUTH DAILY 90 tablet 3   gabapentin (NEURONTIN) 600 MG tablet Take 600 mg by mouth 3 (three) times daily.     losartan (COZAAR) 50 MG tablet TAKE 1 TABLET BY MOUTH DAILY 90 tablet 3   metFORMIN (GLUCOPHAGE-XR) 500 MG 24 hr tablet Take 500 mg by mouth 2 (two) times daily.     montelukast (SINGULAIR) 10 MG tablet TAKE 1 TABLET BY MOUTH DAILY 90 tablet 3   pantoprazole (PROTONIX) 40 MG tablet ONE TABLET BY MOUTH DAILY 90 tablet 2   predniSONE (DELTASONE) 10 MG tablet 3 x 3 days, 2 x 3 days, 1 x 3 days then stop 18 tablet 0   pregabalin (LYRICA) 100 MG capsule      Tiotropium Bromide Monohydrate (SPIRIVA RESPIMAT) 2.5 MCG/ACT AERS Inhale 2 puffs into the lungs daily. 4 g 11   Tiotropium Bromide Monohydrate (SPIRIVA RESPIMAT) 2.5 MCG/ACT AERS Inhale 2 puffs into the lungs daily. 8 g 0   traZODone (DESYREL) 25 mg TABS tablet Take 25 mg by mouth at bedtime.     Vitamin D, Ergocalciferol, (DRISDOL) 1.25 MG (50000 UNIT) CAPS capsule TAKE 1 CAPSULE BY MOUTH WEEKLY 12 capsule 0   No current facility-administered medications for this visit.    SURGICAL HISTORY:  Past Surgical History:  Procedure Laterality Date   ABDOMINAL HYSTERECTOMY     BALLOON DILATION N/A 03/15/2018   Procedure:  BALLOON DILATION;  Surgeon: Carol Ada, MD;  Location: [facility];  Service: Endoscopy;  Laterality: N/A;   CARPAL TUNNEL RELEASE Left 04/19/2018   Procedure: CARPAL TUNNEL RELEASE;  Surgeon: Ashok Pall, MD;  Location: [facility];  Service: Neurosurgery;  Laterality: Left;  CARPAL TUNNEL RELEASE   CESAREAN SECTION     COLONOSCOPY WITH PROPOFOL N/A 04/02/2015   Procedure: COLONOSCOPY WITH PROPOFOL;  Surgeon: Carol Ada, MD;  Location: [facility];  Service: Endoscopy;  Laterality: N/A;   ESOPHAGOGASTRODUODENOSCOPY (EGD) WITH PROPOFOL N/A 04/02/2015   Procedure: ESOPHAGOGASTRODUODENOSCOPY (EGD) WITH PROPOFOL;  Surgeon: Carol Ada, MD;  Location: [facility];  Service: Endoscopy;  Laterality: N/A;   ESOPHAGOGASTRODUODENOSCOPY (EGD) WITH PROPOFOL N/A 03/15/2018   Procedure: ESOPHAGOGASTRODUODENOSCOPY (EGD) WITH PROPOFOL;  Surgeon: Carol Ada, MD;  Location: [facility];  Service: Endoscopy;  Laterality: N/A;   HEMORRHOID SURGERY     HERNIA REPAIR     HERNIA REPAIR      REVIEW OF SYSTEMS:   Review of Systems  Constitutional: Positive for chronic fatigue.  Negative for appetite change, chills, fever and unexpected weight change.  HENT: Negative for mouth sores, nosebleeds, sore throat and trouble swallowing.   Eyes: Negative for eye problems and icterus.  Respiratory: Positive for dyspnea on exertion (stable/chronic).  Negative for cough, hemoptysis, and wheezing.   Cardiovascular: Negative for chest pain and leg swelling.  Gastrointestinal: Negative for abdominal pain, constipation, diarrhea, nausea and vomiting.  Genitourinary: Negative for bladder incontinence, difficulty urinating, dysuria, frequency and hematuria.   Musculoskeletal: Negative for back pain, gait problem, neck pain and neck stiffness.  Skin: Negative for itching and rash.  Neurological: Positive for occasional lightheadedness.  Negative for dizziness, extremity weakness, gait problem, headaches, and seizures.   Hematological: Negative for adenopathy. Does not bruise/bleed easily.  Psychiatric/Behavioral: Negative for confusion, depression and sleep disturbance. The patient is not nervous/anxious.     PHYSICAL EXAMINATION:  Blood pressure (!) 123/50, pulse 72, temperature (!) 96.6 F (35.9 C), temperature source Tympanic, resp. rate 16, weight 208 lb 6 oz (94.5 kg), SpO2 96 %.  ECOG PERFORMANCE STATUS: 2-3  Physical Exam  Constitutional: Oriented to person, place, and time and well-developed, well-nourished, and in no distress.  HENT:  Head: Normocephalic and atraumatic.  Mouth/Throat: Oropharynx is clear and moist. No oropharyngeal exudate.  Eyes: Conjunctivae are normal. Right eye exhibits no discharge. Left eye exhibits no discharge. No scleral icterus.  Neck: Normal range of motion. Neck supple.  Cardiovascular: Normal rate, regular rhythm, normal heart sounds and intact distal pulses.   Pulmonary/Chest: Effort normal and breath sounds normal. No respiratory distress. No wheezes. No rales.  Abdominal: Soft. Bowel sounds are normal. Exhibits no distension and no mass. There is no tenderness.  Musculoskeletal: Normal range of motion. Exhibits no edema.  Lymphadenopathy:    No cervical adenopathy.  Neurological: Alert and oriented to person, place, and time. Exhibits normal muscle tone. Gait normal. Coordination normal.  Skin: Skin is warm and dry. No rash noted. Not diaphoretic. No erythema. No pallor.  Psychiatric: Mood, memory and judgment normal.  Vitals reviewed.  LABORATORY DATA: Lab Results  Component Value Date   WBC 5.5 12/02/2021   HGB 10.2 (L) 12/02/2021   HCT 34.3 (L) 12/02/2021   MCV 98.3 12/02/2021   PLT 281 12/02/2021      Chemistry      Component Value Date/Time   NA 143 12/02/2021 1411   NA 143 07/26/2020 1328   NA 142 02/25/2015 1402   K 3.9 12/02/2021 1411   K 4.0 02/25/2015 1402   CL 105 12/02/2021 1411   CO2 31 12/02/2021 1411   CO2 30 (H) 02/25/2015  1402   BUN 12 12/02/2021 1411   BUN 18 07/26/2020 1328   BUN 11.2 02/25/2015 1402   CREATININE 1.17 (H) 12/02/2021 1411   CREATININE 1.17 (H) 02/04/2021 1211   CREATININE 1.0 02/25/2015 1402      Component Value Date/Time   CALCIUM 9.7 12/02/2021 1411   CALCIUM 8.8 02/25/2015 1402   ALKPHOS 89 12/02/2021 1411   ALKPHOS 90 02/25/2015 1402   AST 9 (L) 12/02/2021  1411   AST 9 (L) 02/04/2021 1211   AST 24 02/25/2015 1402   ALT 7 12/02/2021 1411   ALT 8 02/04/2021 1211   ALT 23 02/25/2015 1402   BILITOT 0.2 (L) 12/02/2021 1411   BILITOT 0.3 02/04/2021 1211   BILITOT 0.32 02/25/2015 1402       RADIOGRAPHIC STUDIES:  MM 3D SCREEN BREAST BILATERAL  Result Date: 11/24/2021 CLINICAL DATA:  Screening. EXAM: DIGITAL SCREENING BILATERAL MAMMOGRAM WITH TOMOSYNTHESIS AND CAD TECHNIQUE: Bilateral screening digital craniocaudal and mediolateral oblique mammograms were obtained. Bilateral screening digital breast tomosynthesis was performed. The images were evaluated with computer-aided detection. COMPARISON:  Previous exam(s). ACR Breast Density Category b: There are scattered areas of fibroglandular density. FINDINGS: There are no findings suspicious for malignancy. IMPRESSION: No mammographic evidence of malignancy. A result letter of this screening mammogram will be mailed directly to the patient. RECOMMENDATION: Screening mammogram in one year. (Code:SM-B-01Y) BI-RADS CATEGORY  1: Negative. Electronically Signed   By: Abelardo Diesel M.D.   On: 11/24/2021 14:49    ASSESSMENT & PLAN:  Blakleigh Waychoff Hartnett is a 75 y.o. female with    1. Normocytic anemia secondary to iron deficiency, from GI AVM bleeding  -She has moderate anemia with baseline hemoglobin in 8-9 range, MCV normal, ferritin 16, serum iron 13, URBC elevated at 430, iron saturation 3%, this is consistent with iron deficient anemia -Anemia has been several years, likely secondary to slow GI bleeding. Her stool OB was positive. Colonoscopy  showed AVM, last done in 03/2015. Her 03/15/18 Upper Endoscopy with Dr Benson Norway showed no evidence of bleeding.  -Her baseline SPEP and UPEP were negative.  -She also may have a component of anemia of chronic disease secondary to kidney disease and rheumatoid arthritis -She has received IV Feraheme as needed since 2016. This was switched to 400 mg weekly x2 due to transportation issues. Most recently IV iron 08/2021. She responded well and continues oral iron BID.  -She continues to have continued fatigue. She denies GI bleeding or overt blood loss. Labs reviewed, Hg 10.2. Creatinine stable 1.17. and iron panel shows iron of 31, TIBC at 293, saturation of 11, and UIBC of 262. Her ferritin 142.  -She was supposed to receive monthly B12 injections in the clinic. She missed all of these appointments. B12 pending at the time of her visit today but she reported she was only taking this once a week. She is scheduled for B12. She will receive this as planned.  -Her B12 resulted at 678. Since she has transporation concerns, I called the patient and let her know we can just continue on oral B12 for now. Advised to take daily.  -Continue oral 123456, Folic acid, iron ferrous sulfate BID daily. Will set up iv feraheme if ferritin<100 per Dr. Ernestina Penna laste note which she may need in a few months. -Given symptoms, will monitor with labs every 2 months and follow up visit with labs in 4 months.    2. Hypertension, diabetes, rheumatoid arthritis, asthma, CKD stage III, H/o Hep C (s/p treatment) -Her PCP is reportedly out of network starting this year.  -The patient is wondering if she can be referred to another practice.  -Discussed with the patient that she can check online with her insurance and see who is in network. She does not have a computer.  -She states  is in network.  -She would be open to seeing the internal medicine residency clinic. I have placed a referral.  -Her  CKD is secondary to her HTN and DM,  she will continue to mange them.          -She previously saw Dr. Beverley Fiedler. She has not been seen for about a year, and her last liver US was in 2019. Her next US liver is scheduled for 12/05/21   3. Insomnia  -Her increased fatigue is multifactorial including her trouble sleeping. Dr. Burr Medico previously recommend she increase her Melatonin to 10-15mg .    4. Social Support -she reports transportation issues, which has caused her to miss appointments. -She was previously unaware [facility] had a transportation service. Dr. Burr Medico and her team have gotten the patient connected.      Plan -based on today's lab results, no venofer needed at this time.  -labs in 2 months. Will arrange venofer if needed.  -labs and follow up in 4 months.  -Continue oral iron BID, Folic Acid, and 123456 -Referral placed to internal medicine residency clinic.     Orders Placed This Encounter  Procedures   Ambulatory referral to Internal Medicine    Referral Priority:   Routine    Referral Type:   Consultation    Referral Reason:   Specialty Services Required    Requested Specialty:   Internal Medicine    Number of Visits Requested:   1      The total time spent in the appointment was 20-29 minutes.   Valerie Cones L Okie Jansson, PA-C 12/02/21

## 2021-12-02 ENCOUNTER — Inpatient Hospital Stay: Payer: Medicare HMO

## 2021-12-02 ENCOUNTER — Other Ambulatory Visit: Payer: Self-pay

## 2021-12-02 ENCOUNTER — Other Ambulatory Visit: Payer: Self-pay | Admitting: Hematology

## 2021-12-02 ENCOUNTER — Inpatient Hospital Stay: Payer: Medicare HMO | Attending: Hematology | Admitting: Physician Assistant

## 2021-12-02 VITALS — BP 123/50 | HR 72 | Temp 96.6°F | Resp 16 | Wt 208.4 lb

## 2021-12-02 DIAGNOSIS — N183 Chronic kidney disease, stage 3 unspecified: Secondary | ICD-10-CM

## 2021-12-02 DIAGNOSIS — J45909 Unspecified asthma, uncomplicated: Secondary | ICD-10-CM | POA: Diagnosis not present

## 2021-12-02 DIAGNOSIS — Z8619 Personal history of other infectious and parasitic diseases: Secondary | ICD-10-CM

## 2021-12-02 DIAGNOSIS — Q2730 Arteriovenous malformation, site unspecified: Secondary | ICD-10-CM | POA: Insufficient documentation

## 2021-12-02 DIAGNOSIS — G4700 Insomnia, unspecified: Secondary | ICD-10-CM | POA: Diagnosis not present

## 2021-12-02 DIAGNOSIS — E119 Type 2 diabetes mellitus without complications: Secondary | ICD-10-CM | POA: Diagnosis not present

## 2021-12-02 DIAGNOSIS — M069 Rheumatoid arthritis, unspecified: Secondary | ICD-10-CM | POA: Diagnosis not present

## 2021-12-02 DIAGNOSIS — J849 Interstitial pulmonary disease, unspecified: Secondary | ICD-10-CM | POA: Diagnosis not present

## 2021-12-02 DIAGNOSIS — E538 Deficiency of other specified B group vitamins: Secondary | ICD-10-CM | POA: Insufficient documentation

## 2021-12-02 DIAGNOSIS — E611 Iron deficiency: Secondary | ICD-10-CM

## 2021-12-02 DIAGNOSIS — K922 Gastrointestinal hemorrhage, unspecified: Secondary | ICD-10-CM

## 2021-12-02 DIAGNOSIS — D649 Anemia, unspecified: Secondary | ICD-10-CM

## 2021-12-02 DIAGNOSIS — D500 Iron deficiency anemia secondary to blood loss (chronic): Secondary | ICD-10-CM | POA: Diagnosis not present

## 2021-12-02 DIAGNOSIS — I129 Hypertensive chronic kidney disease with stage 1 through stage 4 chronic kidney disease, or unspecified chronic kidney disease: Secondary | ICD-10-CM | POA: Diagnosis not present

## 2021-12-02 DIAGNOSIS — Z597 Insufficient social insurance and welfare support: Secondary | ICD-10-CM

## 2021-12-02 DIAGNOSIS — E1122 Type 2 diabetes mellitus with diabetic chronic kidney disease: Secondary | ICD-10-CM

## 2021-12-02 DIAGNOSIS — J449 Chronic obstructive pulmonary disease, unspecified: Secondary | ICD-10-CM | POA: Diagnosis not present

## 2021-12-02 LAB — COMPREHENSIVE METABOLIC PANEL
ALT: 7 U/L (ref 0–44)
AST: 9 U/L — ABNORMAL LOW (ref 15–41)
Albumin: 4.2 g/dL (ref 3.5–5.0)
Alkaline Phosphatase: 89 U/L (ref 38–126)
Anion gap: 7 (ref 5–15)
BUN: 12 mg/dL (ref 8–23)
CO2: 31 mmol/L (ref 22–32)
Calcium: 9.7 mg/dL (ref 8.9–10.3)
Chloride: 105 mmol/L (ref 98–111)
Creatinine, Ser: 1.17 mg/dL — ABNORMAL HIGH (ref 0.44–1.00)
GFR, Estimated: 49 mL/min — ABNORMAL LOW (ref >60)
Glucose, Bld: 125 mg/dL — ABNORMAL HIGH (ref 70–99)
Potassium: 3.9 mmol/L (ref 3.5–5.1)
Sodium: 143 mmol/L (ref 135–145)
Total Bilirubin: 0.2 mg/dL — ABNORMAL LOW (ref 0.3–1.2)
Total Protein: 7.1 g/dL (ref 6.5–8.1)

## 2021-12-02 LAB — CBC WITH DIFFERENTIAL/PLATELET
Abs Immature Granulocytes: 0.02 10*3/uL (ref 0.00–0.07)
Basophils Absolute: 0 10*3/uL (ref 0.0–0.1)
Basophils Relative: 0 %
Eosinophils Absolute: 0.2 10*3/uL (ref 0.0–0.5)
Eosinophils Relative: 4 %
HCT: 34.3 % — ABNORMAL LOW (ref 36.0–46.0)
Hemoglobin: 10.2 g/dL — ABNORMAL LOW (ref 12.0–15.0)
Immature Granulocytes: 0 %
Lymphocytes Relative: 28 %
Lymphs Abs: 1.5 10*3/uL (ref 0.7–4.0)
MCH: 29.2 pg (ref 26.0–34.0)
MCHC: 29.7 g/dL — ABNORMAL LOW (ref 30.0–36.0)
MCV: 98.3 fL (ref 80.0–100.0)
Monocytes Absolute: 0.4 10*3/uL (ref 0.1–1.0)
Monocytes Relative: 7 %
Neutro Abs: 3.4 10*3/uL (ref 1.7–7.7)
Neutrophils Relative %: 61 %
Platelets: 281 10*3/uL (ref 150–400)
RBC: 3.49 MIL/uL — ABNORMAL LOW (ref 3.87–5.11)
RDW: 15.5 % (ref 11.5–15.5)
WBC: 5.5 10*3/uL (ref 4.0–10.5)
nRBC: 0 % (ref 0.0–0.2)

## 2021-12-02 LAB — IRON AND IRON BINDING CAPACITY (CC-WL,HP ONLY)
Iron: 31 ug/dL (ref 28–170)
Saturation Ratios: 11 % (ref 10.4–31.8)
TIBC: 293 ug/dL (ref 250–450)
UIBC: 262 ug/dL (ref 148–442)

## 2021-12-02 LAB — VITAMIN B12: Vitamin B-12: 678 pg/mL (ref 180–914)

## 2021-12-02 LAB — FOLATE: Folate: 40 ng/mL (ref >5.9)

## 2021-12-02 LAB — FERRITIN: Ferritin: 142 ng/mL (ref 11–307)

## 2021-12-02 MED ORDER — CYANOCOBALAMIN 1000 MCG/ML IJ SOLN
1000.0000 ug | Freq: Once | INTRAMUSCULAR | Status: AC
  Administered 2021-12-02: 1000 ug via INTRAMUSCULAR
  Filled 2021-12-02: qty 1

## 2021-12-03 DIAGNOSIS — J449 Chronic obstructive pulmonary disease, unspecified: Secondary | ICD-10-CM | POA: Diagnosis not present

## 2021-12-05 ENCOUNTER — Ambulatory Visit
Admission: RE | Admit: 2021-12-05 | Discharge: 2021-12-05 | Disposition: A | Discharge status: Home / Self Care | Payer: Medicare HMO | Source: Ambulatory Visit | Attending: Nurse Practitioner | Admitting: Nurse Practitioner

## 2021-12-05 ENCOUNTER — Other Ambulatory Visit: Payer: Self-pay

## 2021-12-05 DIAGNOSIS — K7689 Other specified diseases of liver: Secondary | ICD-10-CM | POA: Diagnosis not present

## 2021-12-05 DIAGNOSIS — K7402 Hepatic fibrosis, advanced fibrosis: Secondary | ICD-10-CM

## 2021-12-06 ENCOUNTER — Other Ambulatory Visit: Payer: Self-pay

## 2021-12-12 DIAGNOSIS — H903 Sensorineural hearing loss, bilateral: Secondary | ICD-10-CM | POA: Diagnosis not present

## 2021-12-21 ENCOUNTER — Telehealth: Payer: Self-pay | Admitting: Internal Medicine

## 2021-12-21 ENCOUNTER — Telehealth: Payer: Self-pay

## 2021-12-21 NOTE — Telephone Encounter (Signed)
° ° °*  STAT* If patient is at the pharmacy, call can be transferred to refill team.   1. Which medications need to be refilled? (please list name of each medication and dose if known)    diclofenac sodium (VOLTAREN) 1 % GEL    2. Which pharmacy/location (including street and city if local pharmacy) is medication to be sent to? [facility] - [place], [place] - [phone]  3. Do they need a 30 day or 90 day supply? 1 packed  Pt said her pcp is gone and she needs this meds, she asked if Dr. Tenny Craw can write this prescription again

## 2021-12-21 NOTE — Telephone Encounter (Signed)
Lendon KaWilson, Michalene, RN      1:44 PM Note   Should be deferred to PCP for refills.      Jacqlyn Kraussobertson, Melissa, CMA to Lendon KaWilson, Michalene, RN     10:07 AM Please review medication refill request  Apr 03, 2019  Called pt to inform her per phone note, to contact PCP for any further refill for this medication. Pt verbalized understanding.

## 2021-12-21 NOTE — Telephone Encounter (Signed)
Pt called wanting a prescription for Voltaren Gel.  Medication is an OTC medication but the pt wants a prescription for the medication.  Notified Dr. Mosetta PuttFeng of pt's request.

## 2021-12-22 ENCOUNTER — Other Ambulatory Visit: Payer: Self-pay

## 2021-12-22 ENCOUNTER — Other Ambulatory Visit: Payer: Self-pay | Admitting: Hematology

## 2021-12-22 MED ORDER — DICLOFENAC SODIUM 1 % EX GEL: 2.0000 g | Freq: Four times a day (QID) | CUTANEOUS | 1 refills | Status: AC

## 2021-12-22 NOTE — Progress Notes (Signed)
Per staff message from Dr. Mosetta Putt to order Voltaren 1% Gel.  Sent in prescription to pt preferred pharmacy.

## 2021-12-23 DIAGNOSIS — R251 Tremor, unspecified: Secondary | ICD-10-CM | POA: Diagnosis not present

## 2021-12-26 DIAGNOSIS — E119 Type 2 diabetes mellitus without complications: Secondary | ICD-10-CM | POA: Diagnosis not present

## 2021-12-26 DIAGNOSIS — M25559 Pain in unspecified hip: Secondary | ICD-10-CM | POA: Diagnosis not present

## 2021-12-26 DIAGNOSIS — R635 Abnormal weight gain: Secondary | ICD-10-CM | POA: Diagnosis not present

## 2021-12-26 DIAGNOSIS — I10 Essential (primary) hypertension: Secondary | ICD-10-CM | POA: Diagnosis not present

## 2021-12-26 DIAGNOSIS — Z79899 Other long term (current) drug therapy: Secondary | ICD-10-CM | POA: Diagnosis not present

## 2022-01-03 DIAGNOSIS — J449 Chronic obstructive pulmonary disease, unspecified: Secondary | ICD-10-CM | POA: Diagnosis not present

## 2022-01-06 ENCOUNTER — Telehealth: Payer: Self-pay | Admitting: Internal Medicine

## 2022-01-06 NOTE — Telephone Encounter (Signed)
Spoke with the patient who would like to know if she can take tylenol 650 mg tablets instead of 500 mg tablets. She states that she is taking them for arthritis pain. Advised patient to contact her PCP in regards to Tylenol dosage. She states that she does not have a PCP anymore because they no longer take her insurance. She is working on finding a new one. She states that she did talk with her GI doctor that follows her liver who advised not to exceed 1,000mg  of tylenol per day. I advised her to follow the recommendations of her GI doctor and to discuss arthritis pain with PCP once she finds a new one for additional management. Patient verbalized understanding.

## 2022-01-06 NOTE — Telephone Encounter (Signed)
Pt c/o medication issue:  1. Name of Medication: tylenol 500 mg  2. How are you currently taking this medication (dosage and times per day)? 2 tablet daily  3. Are you having a reaction (difficulty breathing--STAT)? no  4. What is your medication issue? Patient states she currently take 500 mg tablets daily, but they are not doing much for her and she found 650 mg tablets. She would like to know if she can take the 650 mg tablets instead.

## 2022-01-09 DIAGNOSIS — R251 Tremor, unspecified: Secondary | ICD-10-CM | POA: Diagnosis not present

## 2022-01-09 DIAGNOSIS — R202 Paresthesia of skin: Secondary | ICD-10-CM | POA: Diagnosis not present

## 2022-01-11 ENCOUNTER — Ambulatory Visit: Payer: Medicare HMO | Admitting: Pulmonary Disease

## 2022-01-11 NOTE — Progress Notes (Unsigned)
Synopsis: Returns to clinic to re-establish care  Subjective:   PATIENT ID: Anita Gonzalez GENDER: female DOB: July 25, 1946, MRN: [id number]  HPI  No chief complaint on file.  Midge Ringen is a 76 year old woman, former smoker with chronic hypoxemic respiratory failure on 2L O2 with ambulation, asthma and obstructive sleep apnea who returns to pulmonary clinic for follow up.    She is currently using advair diskus instead of trelegy as her insurance company would not cover Trelegy. She has ongoing exertional dyspnea and coughing with exertion.  She was diagnosed with sleep apnea in the past and has not been on CPAP as she did not tolerate it previously.   PFTs from 2017 show FEV1/FVC 89, FEV1 1.03L (63%), FVC 1.16L (55%), TLC 2.84 (61%), DLCO 68% consistent with a mild restrictive defect and mild diffusion defect.   HR CT Chest in 2017 showed scattered areas of mild cylindrical bronchiectasis and associated thickening of the peribronchovascular interstitium. Inspiratory and expiratory imaging demonstrates air trapping. There is severe collapse of the trachea and mild collapse of the mainstem bronchi on expiratory phase indicative of tracheobronchomalacia.    Past Medical History:  Diagnosis Date   Acute kidney injury ([place]) 01/11/2015   Alcohol abuse, in remission    Anemia    Anemia, iron deficiency 01/28/2015   Arthritis    Asthma    Bronchiectasis ([place]) 03/07/2016   Bronchitis    COPD (chronic obstructive pulmonary disease) ([place])    Diabetes mellitus without complication ([place])    type 2   DM II (diabetes mellitus, type II), controlled ([place]) 01/06/2015   Dyspnea and respiratory abnormality 04/06/2015   Dysrhythmia    hx of palpitations   E-coli UTI 01/11/2015   Essential hypertension 01/06/2015   GERD (gastroesophageal reflux disease)    Hay fever    Hepatitis    c treated with pills   HLD (hyperlipidemia) 01/06/2015   Hypertension    IBS (irritable bowel syndrome)    ILD  (interstitial lung disease) ([place]) 12/11/2016   Leg cramps, sleep related 12/01/2015   Pneumonia    Rheumatoid arthritis ([place]) 1996   Sinus tachycardia 01/11/2015   LT monitor 01/2019: normal sinus rhythm, occ short bursts of PAT, accelerated junctional rhythm, no sig arrhythmias.      Family History  Problem Relation Age of Onset   Diabetes Mother    Heart failure Mother    Throat cancer Brother        throat cancer    Asthma Other    COPD Other    Hypertension Other    Stroke Other    Heart disease Other      Social History   Socioeconomic History   Marital status: Single    Spouse name: Not on file   Number of children: Not on file   Years of education: Not on file   Highest education level: Not on file  Occupational History   Occupation: retired  Tobacco Use   Smoking status: Former    Packs/day: 0.75    Years: 25.00    Pack years: 18.75    Types: Cigarettes    Quit date: 01/12/2011    Years since quitting: 11.0   Smokeless tobacco: Never  Vaping Use   Vaping Use: Never used  Substance and Sexual Activity   Alcohol use: Yes    Alcohol/week: 4.0 standard drinks    Types: 4 Cans of beer per week    Comment: heavy drinker in the  past   Drug use: No   Sexual activity: Not Currently  Other Topics Concern   Not on file  Social History Narrative   Not on file   Social Determinants of Health   Financial Resource Strain: Not on file  Food Insecurity: Not on file  Transportation Needs: Not on file  Physical Activity: Not on file  Stress: Not on file  Social Connections: Not on file  Intimate Partner Violence: Not on file     Allergies  Allergen Reactions   Fish Allergy Anaphylaxis, Hives, Swelling and Other (See Comments)   Peanuts [Peanut Oil] Anaphylaxis, Hives, Itching and Swelling   Penicillins Hives, Itching, Swelling and Other (See Comments)    PATIENT HAS HAD A PCN REACTION WITH IMMEDIATE RASH, FACIAL/TONGUE/THROAT SWELLING, SOB, OR LIGHTHEADEDNESS  WITH HYPOTENSION:  #  #  YES  #  #  HAS PT DEVELOPED SEVERE RASH INVOLVING MUCUS MEMBRANES or SKIN NECROSIS: #  #  YES  #  # PATIENT HAS HAD A PCN REACTION THAT REQUIRED HOSPITALIZATION:  #  #  YES  #  #  Has patient had a PCN reaction occurring within the last 10 years: No    Iodinated Contrast Media Hives, Itching and Swelling    SWELLING REACTION UNSPECIFIED    Iodine Hives, Itching and Swelling    Patient reports receiving IV dye in abdominal CT scan without issue   Percocet [Oxycodone-Acetaminophen] Nausea Only, Anxiety and Other (See Comments)    Raised blood pressure, Sweats, nervous,dizziness   2,4-D Dimethylamine Other (See Comments)   Peanut (Diagnostic) Other (See Comments)   Peanut Allergen Powder-Dnfp    Hydrocodone Itching and Nausea Only   Other Other (See Comments)    UNSPECIFIED REACTION  Hypersensitive to perfumes, colognes, powders, lotions, room sprays   Oxycodone Itching, Nausea Only and Other (See Comments)    Sweats and dizziness   Sulfa Antibiotics     UNSPECIFIED REACTION      Outpatient Medications Prior to Visit  Medication Sig Dispense Refill   ADVAIR DISKUS 500-50 MCG/ACT AEPB INHALE 1 PUFF BY MOUTH 2 TIMES A DAY 60 each 10   albuterol (PROVENTIL) (2.5 MG/3ML) 0.083% nebulizer solution USE 1 UNIT DOSE(VIAL)VIA NEBULIZER 4 TIMES A DAY AS NEEDED AS DIRECTED 1080 mL 3   albuterol (VENTOLIN HFA) 108 (90 Base) MCG/ACT inhaler INHALE 2 PUFFS BY MOUTH EVERY 6 HRS AS NEEDED 54 g 10   amLODipine (NORVASC) 5 MG tablet TAKE 1 TABLET BY MOUTH IN THE MORNING AND AT BEDTIME 180 tablet 3   aspirin EC 81 MG tablet Take 1 tablet (81 mg total) by mouth daily. 90 tablet 3   atorvastatin (LIPITOR) 40 MG tablet ONE TABLET BY MOUTH DAILY 90 tablet 0   azithromycin (ZITHROMAX) 250 MG tablet Take 1 tablet (250 mg total) by mouth as directed. 6 tablet 0   bumetanide (BUMEX) 2 MG tablet TAKE 1 TABLET BY MOUTH 2 TIMES A WEEK 24 tablet 3   cyanocobalamin 1000 MCG tablet Take 1,000  mcg by mouth every Thursday.      cyclobenzaprine (FLEXERIL) 10 MG tablet TAKE ONE TABLET BY MOUTH AT BEDTIME 30 tablet 10   diclofenac Sodium (VOLTAREN) 1 % GEL Apply 2 g topically 4 (four) times daily. 350 g 1   diphenoxylate-atropine (LOMOTIL) 2.5-0.025 MG tablet Take 1 tablet by mouth 4 (four) times daily as needed for diarrhea or loose stools.      docusate sodium (COLACE) 100 MG capsule Take 100 mg by  mouth 2 (two) times daily as needed for mild constipation.     ezetimibe (ZETIA) 10 MG tablet ONE TABLET BY MOUTH DAILY 90 tablet 2   ferrous sulfate 325 (65 FE) MG tablet Take 325 mg by mouth 2 (two) times daily with a meal.      fluticasone (FLONASE) 50 MCG/ACT nasal spray Place 1 spray into both nostrils daily as needed for allergies or rhinitis.     folic acid (FOLVITE) 1 MG tablet TAKE 1 TABLET BY MOUTH DAILY 90 tablet 3   gabapentin (NEURONTIN) 600 MG tablet Take 600 mg by mouth 3 (three) times daily.     losartan (COZAAR) 50 MG tablet TAKE 1 TABLET BY MOUTH DAILY 90 tablet 3   metFORMIN (GLUCOPHAGE-XR) 500 MG 24 hr tablet Take 500 mg by mouth 2 (two) times daily.     montelukast (SINGULAIR) 10 MG tablet TAKE 1 TABLET BY MOUTH DAILY 90 tablet 3   pantoprazole (PROTONIX) 40 MG tablet ONE TABLET BY MOUTH DAILY 90 tablet 2   predniSONE (DELTASONE) 10 MG tablet 3 x 3 days, 2 x 3 days, 1 x 3 days then stop 18 tablet 0   pregabalin (LYRICA) 100 MG capsule      Tiotropium Bromide Monohydrate (SPIRIVA RESPIMAT) 2.5 MCG/ACT AERS Inhale 2 puffs into the lungs daily. 4 g 11   Tiotropium Bromide Monohydrate (SPIRIVA RESPIMAT) 2.5 MCG/ACT AERS Inhale 2 puffs into the lungs daily. 8 g 0   traZODone (DESYREL) 25 mg TABS tablet Take 25 mg by mouth at bedtime.     Vitamin D, Ergocalciferol, (DRISDOL) 1.25 MG (50000 UNIT) CAPS capsule TAKE 1 CAPSULE BY MOUTH WEEKLY 12 capsule 0   No facility-administered medications prior to visit.    Review of Systems  Constitutional:  Negative for chills, fever,  malaise/fatigue and weight loss.  HENT:  Negative for congestion, sinus pain and sore throat.   Eyes: Negative.   Respiratory:  Positive for cough, sputum production and shortness of breath. Negative for hemoptysis and wheezing.   Cardiovascular:  Negative for chest pain, palpitations, orthopnea, claudication, leg swelling and PND.  Gastrointestinal:  Negative for abdominal pain, heartburn, nausea and vomiting.  Genitourinary: Negative.   Musculoskeletal: Negative.   Neurological:  Negative for dizziness, weakness and headaches.  Psychiatric/Behavioral: Negative.     Objective:   There were no vitals filed for this visit.   Physical Exam Constitutional:      General: She is not in acute distress.    Appearance: She is obese.  HENT:     Head: Normocephalic and atraumatic.     Nose: Nose normal.     Mouth/Throat:     Mouth: Mucous membranes are moist.     Pharynx: Oropharynx is clear.  Eyes:     General: No scleral icterus.    Conjunctiva/sclera: Conjunctivae normal.     Pupils: Pupils are equal, round, and reactive to light.  Cardiovascular:     Rate and Rhythm: Normal rate and regular rhythm.     Pulses: Normal pulses.     Heart sounds: Normal heart sounds. No murmur heard. Pulmonary:     Effort: Pulmonary effort is normal.     Breath sounds: No wheezing, rhonchi or rales.  Musculoskeletal:     Cervical back: Neck supple.     Right lower leg: No edema.     Left lower leg: No edema.  Skin:    General: Skin is warm and dry.  Neurological:     General:  No focal deficit present.     Mental Status: She is alert.    CBC    Component Value Date/Time   WBC 5.5 12/02/2021 1411   RBC 3.49 (L) 12/02/2021 1411   HGB 10.2 (L) 12/02/2021 1411   HGB 10.2 (L) 02/04/2021 1211   HGB 10.9 (L) 04/09/2019 1530   HGB 11.9 07/24/2017 1334   HCT 34.3 (L) 12/02/2021 1411   HCT 32.4 (L) 04/09/2019 1530   HCT 37.1 07/24/2017 1334   PLT 281 12/02/2021 1411   PLT 258 02/04/2021 1211    PLT 281 04/09/2019 1530   MCV 98.3 12/02/2021 1411   MCV 93 04/09/2019 1530   MCV 96.7 07/24/2017 1334   MCH 29.2 12/02/2021 1411   MCHC 29.7 (L) 12/02/2021 1411   RDW 15.5 12/02/2021 1411   RDW 13.3 04/09/2019 1530   RDW 14.6 (H) 07/24/2017 1334   LYMPHSABS 1.5 12/02/2021 1411   LYMPHSABS 1.8 07/24/2017 1334   MONOABS 0.4 12/02/2021 1411   MONOABS 0.4 07/24/2017 1334   EOSABS 0.2 12/02/2021 1411   EOSABS 0.2 07/24/2017 1334   BASOSABS 0.0 12/02/2021 1411   BASOSABS 0.0 07/24/2017 1334   BMP Latest Ref Rng & Units 12/02/2021 08/04/2021 02/04/2021  Glucose 70 - 99 mg/dL 125(H) 173(H) 130(H)  BUN 8 - 23 mg/dL 12 16 9   Creatinine 0.44 - 1.00 mg/dL 1.17(H) 1.22(H) 1.17(H)  BUN/Creat Ratio 12 - 28 - - -  Sodium 135 - 145 mmol/L 143 144 145  Potassium 3.5 - 5.1 mmol/L 3.9 4.0 3.7  Chloride 98 - 111 mmol/L 105 102 103  CO2 22 - 32 mmol/L 31 30 31   Calcium 8.9 - 10.3 mg/dL 9.7 9.5 9.4   Chest imaging: CXR 09/02/2018 There is shallow lung inflation. The cardiomediastinal contours are normal.   There is no focal airspace consolidation or pulmonary edema. There is no pleural effusion or pneumothorax.  HRCT Chest 06/08/2016 1. Again noted is mild diffuse cylindrical bronchiectasis and thickening of the peribronchovascular interstitium. The subtle areas of ground-glass attenuation and septal thickening in the lungs persist compared to the prior study, but appear unchanged, and may indicate areas of nonspecific interstitial pneumonia (NSIP). 2. Today's study also demonstrates mild air trapping, indicative of small airways disease, and demonstrates evidence of tracheobronchomalacia. In retrospect, the prior expiratory phase images were unlikely to be truly expiratory on study 04/13/2015. 3. Aortic atherosclerosis, in addition to three-vessel coronary artery disease. Please note that although the presence of coronary artery calcium documents the presence of coronary artery  disease, the severity of this disease and any potential stenosis cannot be assessed on this non-gated CT examination. Assessment for potential risk factor modification, dietary therapy or pharmacologic therapy may be warranted, if clinically indicated. 4. Cardiomegaly with mild left atrial dilatation.  V/Q Scan 05/30/2015 No ventilation or perfusion defects identified. Very low probability of pulmonary embolus.  PFT: PFT Results Latest Ref Rng & Units 05/30/2016 04/16/2015  FVC-Pre L 1.16 1.18  FVC-Predicted Pre % 55 55  FVC-Post L 1.16 1.37  FVC-Predicted Post % 55 64  Pre FEV1/FVC % % 88 88  Post FEV1/FCV % % 89 88  FEV1-Pre L 1.02 1.04  FEV1-Predicted Pre % 63 62  FEV1-Post L 1.03 1.20  DLCO uncorrected ml/min/mmHg 13.80 7.13  DLCO UNC% % 68 35  DLCO corrected ml/min/mmHg 14.85 7.91  DLCO COR %Predicted % 73 39  DLVA Predicted % 147 85  TLC L 2.84 2.72  TLC % Predicted % 61 59  RV % Predicted % 74 71   Echo: 04/13/2015 - Left ventricle: The cavity size was normal. Wall thickness was    normal. Systolic function was normal. The estimated ejection    fraction was in the range of 60% to 65%. Wall motion was normal;    there were no regional wall motion abnormalities. Doppler    parameters are consistent with abnormal left ventricular    relaxation (grade 1 diastolic dysfunction).  - Left atrium: The atrium was mildly dilated.   Stress Test 01/31/2017 1. EF 66%, normal wall motion.  2. Fixed, small, mild mid anterior perfusion defect.  Given normal wall motion, suspect soft tissue attenuation rather than infarction.  No ischemia.  3. Low risk study.    Assessment & Plan:   No diagnosis found.  Discussion: Tonnette Zatorski is a 76 year old woman, former smoker with chronic hypoxemic respiratory failure on 2L O2 with ambulation, asthma, bronchiectasis, tracheobronchomalacia and obstructive sleep apnea who returns to pulmonary clinic for follow up.     Her asthma symptoms were  better controlled on ICS/LAMA/LABA therapy with trelegy but she is unable to get this medication due to insurance issues. She has been usking advair diskus so we will add spiriva to her regimen.   I discussed with her that the cough and dyspnea with exertion is in part related to her tracheobronchomalacia.   She is not interested in CPAP therapy for her history of sleep apnea.   Follow up in 6 months  Freda Jackson, MD [facility] Office: [phone]    Current Outpatient Medications:    ADVAIR DISKUS 500-50 MCG/ACT AEPB, INHALE 1 PUFF BY MOUTH 2 TIMES A DAY, Disp: 60 each, Rfl: 10   albuterol (PROVENTIL) (2.5 MG/3ML) 0.083% nebulizer solution, USE 1 UNIT DOSE(VIAL)VIA NEBULIZER 4 TIMES A DAY AS NEEDED AS DIRECTED, Disp: 1080 mL, Rfl: 3   albuterol (VENTOLIN HFA) 108 (90 Base) MCG/ACT inhaler, INHALE 2 PUFFS BY MOUTH EVERY 6 HRS AS NEEDED, Disp: 54 g, Rfl: 10   amLODipine (NORVASC) 5 MG tablet, TAKE 1 TABLET BY MOUTH IN THE MORNING AND AT BEDTIME, Disp: 180 tablet, Rfl: 3   aspirin EC 81 MG tablet, Take 1 tablet (81 mg total) by mouth daily., Disp: 90 tablet, Rfl: 3   atorvastatin (LIPITOR) 40 MG tablet, ONE TABLET BY MOUTH DAILY, Disp: 90 tablet, Rfl: 0   azithromycin (ZITHROMAX) 250 MG tablet, Take 1 tablet (250 mg total) by mouth as directed., Disp: 6 tablet, Rfl: 0   bumetanide (BUMEX) 2 MG tablet, TAKE 1 TABLET BY MOUTH 2 TIMES A WEEK, Disp: 24 tablet, Rfl: 3   cyanocobalamin 1000 MCG tablet, Take 1,000 mcg by mouth every Thursday. , Disp: , Rfl:    cyclobenzaprine (FLEXERIL) 10 MG tablet, TAKE ONE TABLET BY MOUTH AT BEDTIME, Disp: 30 tablet, Rfl: 10   diclofenac Sodium (VOLTAREN) 1 % GEL, Apply 2 g topically 4 (four) times daily., Disp: 350 g, Rfl: 1   diphenoxylate-atropine (LOMOTIL) 2.5-0.025 MG tablet, Take 1 tablet by mouth 4 (four) times daily as needed for diarrhea or loose stools. , Disp: , Rfl:    docusate sodium (COLACE) 100 MG capsule, Take 100 mg  by mouth 2 (two) times daily as needed for mild constipation., Disp: , Rfl:    ezetimibe (ZETIA) 10 MG tablet, ONE TABLET BY MOUTH DAILY, Disp: 90 tablet, Rfl: 2   ferrous sulfate 325 (65 FE) MG tablet, Take 325 mg by mouth 2 (two) times  daily with a meal. , Disp: , Rfl:    fluticasone (FLONASE) 50 MCG/ACT nasal spray, Place 1 spray into both nostrils daily as needed for allergies or rhinitis., Disp: , Rfl:    folic acid (FOLVITE) 1 MG tablet, TAKE 1 TABLET BY MOUTH DAILY, Disp: 90 tablet, Rfl: 3   gabapentin (NEURONTIN) 600 MG tablet, Take 600 mg by mouth 3 (three) times daily., Disp: , Rfl:    losartan (COZAAR) 50 MG tablet, TAKE 1 TABLET BY MOUTH DAILY, Disp: 90 tablet, Rfl: 3   metFORMIN (GLUCOPHAGE-XR) 500 MG 24 hr tablet, Take 500 mg by mouth 2 (two) times daily., Disp: , Rfl:    montelukast (SINGULAIR) 10 MG tablet, TAKE 1 TABLET BY MOUTH DAILY, Disp: 90 tablet, Rfl: 3   pantoprazole (PROTONIX) 40 MG tablet, ONE TABLET BY MOUTH DAILY, Disp: 90 tablet, Rfl: 2   predniSONE (DELTASONE) 10 MG tablet, 3 x 3 days, 2 x 3 days, 1 x 3 days then stop, Disp: 18 tablet, Rfl: 0   pregabalin (LYRICA) 100 MG capsule, , Disp: , Rfl:    Tiotropium Bromide Monohydrate (SPIRIVA RESPIMAT) 2.5 MCG/ACT AERS, Inhale 2 puffs into the lungs daily., Disp: 4 g, Rfl: 11   Tiotropium Bromide Monohydrate (SPIRIVA RESPIMAT) 2.5 MCG/ACT AERS, Inhale 2 puffs into the lungs daily., Disp: 8 g, Rfl: 0   traZODone (DESYREL) 25 mg TABS tablet, Take 25 mg by mouth at bedtime., Disp: , Rfl:    Vitamin D, Ergocalciferol, (DRISDOL) 1.25 MG (50000 UNIT) CAPS capsule, TAKE 1 CAPSULE BY MOUTH WEEKLY, Disp: 12 capsule, Rfl: 0

## 2022-01-12 DIAGNOSIS — E119 Type 2 diabetes mellitus without complications: Secondary | ICD-10-CM | POA: Diagnosis not present

## 2022-01-20 ENCOUNTER — Telehealth: Payer: Self-pay | Admitting: Hematology

## 2022-01-20 NOTE — Telephone Encounter (Signed)
Called patient regarding upcoming appointment, left a voicemail. 

## 2022-01-24 DIAGNOSIS — I119 Hypertensive heart disease without heart failure: Secondary | ICD-10-CM | POA: Diagnosis not present

## 2022-01-24 DIAGNOSIS — Z0001 Encounter for general adult medical examination with abnormal findings: Secondary | ICD-10-CM | POA: Diagnosis not present

## 2022-01-24 DIAGNOSIS — J3089 Other allergic rhinitis: Secondary | ICD-10-CM | POA: Diagnosis not present

## 2022-01-24 DIAGNOSIS — E1129 Type 2 diabetes mellitus with other diabetic kidney complication: Secondary | ICD-10-CM | POA: Diagnosis not present

## 2022-01-24 DIAGNOSIS — E782 Mixed hyperlipidemia: Secondary | ICD-10-CM | POA: Diagnosis not present

## 2022-01-25 ENCOUNTER — Other Ambulatory Visit: Payer: Self-pay | Admitting: Internal Medicine

## 2022-01-27 ENCOUNTER — Inpatient Hospital Stay: Payer: Medicare HMO | Attending: Hematology

## 2022-01-31 DIAGNOSIS — J449 Chronic obstructive pulmonary disease, unspecified: Secondary | ICD-10-CM | POA: Diagnosis not present

## 2022-02-16 DIAGNOSIS — R6889 Other general symptoms and signs: Secondary | ICD-10-CM | POA: Diagnosis not present

## 2022-02-21 ENCOUNTER — Other Ambulatory Visit: Payer: Self-pay | Admitting: Hematology

## 2022-02-28 ENCOUNTER — Other Ambulatory Visit: Payer: Self-pay | Admitting: Internal Medicine

## 2022-03-01 ENCOUNTER — Encounter: Payer: Medicare HMO | Admitting: Internal Medicine

## 2022-03-01 NOTE — Telephone Encounter (Signed)
PCP (Dr Julio Sickssei-Bonsu) should really be the one who refills this   Go ahead and for few months PCP to fill (since used for lung issues) ?

## 2022-03-01 NOTE — Telephone Encounter (Signed)
Pt's pharmacy is requesting a refill on advair diskus. Would Dr. Tenny Crawoss like to refill with medication? Please address ?

## 2022-03-02 NOTE — Telephone Encounter (Signed)
Future refills to be sent to her PCP Dr. Julio Sicks.  ?

## 2022-03-03 DIAGNOSIS — J449 Chronic obstructive pulmonary disease, unspecified: Secondary | ICD-10-CM | POA: Diagnosis not present

## 2022-03-07 DIAGNOSIS — R2681 Unsteadiness on feet: Secondary | ICD-10-CM | POA: Diagnosis not present

## 2022-03-07 DIAGNOSIS — M2550 Pain in unspecified joint: Secondary | ICD-10-CM | POA: Diagnosis not present

## 2022-03-07 DIAGNOSIS — E1129 Type 2 diabetes mellitus with other diabetic kidney complication: Secondary | ICD-10-CM | POA: Diagnosis not present

## 2022-03-07 DIAGNOSIS — R6889 Other general symptoms and signs: Secondary | ICD-10-CM | POA: Diagnosis not present

## 2022-03-07 DIAGNOSIS — W19XXXD Unspecified fall, subsequent encounter: Secondary | ICD-10-CM | POA: Diagnosis not present

## 2022-03-07 DIAGNOSIS — I119 Hypertensive heart disease without heart failure: Secondary | ICD-10-CM | POA: Diagnosis not present

## 2022-03-07 DIAGNOSIS — E782 Mixed hyperlipidemia: Secondary | ICD-10-CM | POA: Diagnosis not present

## 2022-03-09 ENCOUNTER — Other Ambulatory Visit: Payer: Self-pay | Admitting: Hematology

## 2022-03-21 NOTE — Therapy (Incomplete)
?OUTPATIENT PHYSICAL THERAPY LOWER EXTREMITY EVALUATION ? ? ?Patient Name: Anita Gonzalez ?MRN: [id number] ?DOB:07/27/1946, 76 y.o., female ?Today's Date: 03/21/2022 ? ? ? ?Past Medical History:  ?Diagnosis Date  ? Acute kidney injury (HCC) 01/11/2015  ? Alcohol abuse, in remission   ? Anemia   ? Anemia, iron deficiency 01/28/2015  ? Arthritis   ? Asthma   ? Bronchiectasis (HCC) 03/07/2016  ? Bronchitis   ? COPD (chronic obstructive pulmonary disease) (HCC)   ? Diabetes mellitus without complication (HCC)   ? type 2  ? DM II (diabetes mellitus, type II), controlled (HCC) 01/06/2015  ? Dyspnea and respiratory abnormality 04/06/2015  ? Dysrhythmia   ? hx of palpitations  ? E-coli UTI 01/11/2015  ? Essential hypertension 01/06/2015  ? GERD (gastroesophageal reflux disease)   ? Hay fever   ? Hepatitis   ? c treated with pills  ? HLD (hyperlipidemia) 01/06/2015  ? Hypertension   ? IBS (irritable bowel syndrome)   ? ILD (interstitial lung disease) (HCC) 12/11/2016  ? Leg cramps, sleep related 12/01/2015  ? Pneumonia   ? Rheumatoid arthritis (HCC) 1996  ? Sinus tachycardia 01/11/2015  ? LT monitor 01/2019: normal sinus rhythm, occ short bursts of PAT, accelerated junctional rhythm, no sig arrhythmias.   ? ?Past Surgical History:  ?Procedure Laterality Date  ? ABDOMINAL HYSTERECTOMY    ? BALLOON DILATION N/A 03/15/2018  ? Procedure: BALLOON DILATION;  Surgeon: Jeani HawkingHung, Patrick, MD;  [facility];  Service: Endoscopy;  Laterality: N/A;  ? CARPAL TUNNEL RELEASE Left 04/19/2018  ? Procedure: CARPAL TUNNEL RELEASE;  Surgeon: Coletta Memosabbell, Kyle, MD;  [facility];  Service: Neurosurgery;  Laterality: Left;  CARPAL TUNNEL RELEASE  ? CESAREAN SECTION    ? COLONOSCOPY WITH PROPOFOL N/A 04/02/2015  ? Procedure: COLONOSCOPY WITH PROPOFOL;  Surgeon: Jeani HawkingPatrick Hung, MD;  Location: [facility];  Service: Endoscopy;  Laterality: N/A;  ? ESOPHAGOGASTRODUODENOSCOPY (EGD) WITH PROPOFOL N/A 04/02/2015  ? Procedure: ESOPHAGOGASTRODUODENOSCOPY (EGD) WITH  PROPOFOL;  Surgeon: Jeani HawkingPatrick Hung, MD;  Location: [facility];  Service: Endoscopy;  Laterality: N/A;  ? ESOPHAGOGASTRODUODENOSCOPY (EGD) WITH PROPOFOL N/A 03/15/2018  ? Procedure: ESOPHAGOGASTRODUODENOSCOPY (EGD) WITH PROPOFOL;  Surgeon: Jeani HawkingHung, Patrick, MD;  Location: [facility];  Service: Endoscopy;  Laterality: N/A;  ? HEMORRHOID SURGERY    ? HERNIA REPAIR    ? HERNIA REPAIR    ? ?Patient Active Problem List  ? Diagnosis Date Noted  ? Diabetic renal disease (HCC) 09/27/2021  ? Dyspepsia 09/27/2021  ? Gout 09/27/2021  ? Hypertensive heart disease without congestive heart failure 09/27/2021  ? Iron deficiency 09/27/2021  ? Moderate persistent asthma, uncomplicated 09/27/2021  ? Neuropathy 09/27/2021  ? Obese 09/27/2021  ? Other seasonal allergic rhinitis 09/27/2021  ? Oxygen dependent 09/27/2021  ? Polyneuropathy due to type 2 diabetes mellitus (HCC) 09/27/2021  ? Polyneuropathy in diseases classified elsewhere (HCC) 09/27/2021  ? Vitamin D deficiency 09/27/2021  ? Arteriosclerosis of coronary artery 07/26/2021  ? Chronic obstructive pulmonary disease (HCC) 07/26/2021  ? Gastroesophageal reflux disease 07/26/2021  ? Hepatic fibrosis, advanced fibrosis 07/26/2021  ? Osteoarthritis of knee 06/04/2020  ? Low back pain 06/03/2020  ? Bilateral leg pain 07/09/2018  ? ILD (interstitial lung disease) (HCC) 12/11/2016  ? Bronchiectasis (HCC) 03/07/2016  ? Asthma 03/06/2016  ? Unspecified viral hepatitis C without hepatic coma 02/18/2016  ? Chronic pain disorder 02/07/2016  ? Leg cramps, sleep related 12/01/2015  ? OSA (obstructive sleep apnea) 07/20/2015  ? Dyspnea and respiratory abnormality 04/06/2015  ?  Hypoxemia 04/06/2015  ? History of rheumatoid arthritis 04/06/2015  ? History of smoking 04/06/2015  ? Anemia, iron deficiency 01/28/2015  ? Acute kidney injury (HCC) 01/11/2015  ? E-coli UTI 01/11/2015  ? Sinus tachycardia 01/11/2015  ? Anemia 01/06/2015  ? Essential hypertension 01/06/2015  ? Type 2 diabetes mellitus  without complication (HCC) 01/06/2015  ? HLD (hyperlipidemia) 01/06/2015  ? Rheumatoid arthritis (HCC) 01/06/2015  ? ? ?PCP: [name], MD ? ?REFERRING PROVIDER: [name], MD ? ?REFERRING DIAG: R26.81 (ICD-10-CM) - Unsteadiness on feet ? ?THERAPY DIAG:  ?No diagnosis found. ? ?ONSET DATE: *** ? ?SUBJECTIVE:  ? ?SUBJECTIVE STATEMENT: ?*** ? ?PERTINENT HISTORY: ?*** ? ?PAIN:  ?Are you having pain? {OPRCPAIN:27236} ? ?PRECAUTIONS: {Therapy precautions:24002} ? ?WEIGHT BEARING RESTRICTIONS {Yes ***/No:24003} ? ?FALLS:  ?Has patient fallen in last 6 months? {fallsyesno:27318} ? ?LIVING ENVIRONMENT: ?Lives with: {OPRC lives with:25569::"lives with their family"} ?Lives in: {Lives in:25570} ?Stairs: {opstairs:27293} ?Has following equipment at home: {Assistive devices:23999} ? ?OCCUPATION: *** ? ?PLOF: {PLOF:24004} ? ?PATIENT GOALS *** ? ? ?OBJECTIVE:  ? ?DIAGNOSTIC FINDINGS: *** ? ?PATIENT SURVEYS:  ?{rehab surveys:24030} ? ?COGNITION: ? Overall cognitive status: {cognition:24006}   ?  ?SENSATION: ?{sensation:27233} ? ?MUSCLE LENGTH: ?Hamstrings: Right *** deg; Left *** deg ?Thomas test: Right *** deg; Left *** deg ? ?POSTURE:  ?*** ? ?PALPATION: ?*** ? ?LE ROM: ? ?{AROM/PROM:27142} ROM Right ?03/21/2022 Left ?03/21/2022  ?Hip flexion    ?Hip extension    ?Hip abduction    ?Hip adduction    ?Hip internal rotation    ?Hip external rotation    ?Knee flexion    ?Knee extension    ?Ankle dorsiflexion    ?Ankle plantarflexion    ?Ankle inversion    ?Ankle eversion    ? (Blank rows = not tested) ? ?LE MMT: ? ?MMT Right ?03/21/2022 Left ?03/21/2022  ?Hip flexion    ?Hip extension    ?Hip abduction    ?Hip adduction    ?Hip internal rotation    ?Hip external rotation    ?Knee flexion    ?Knee extension    ?Ankle dorsiflexion    ?Ankle plantarflexion    ?Ankle inversion    ?Ankle eversion    ? (Blank rows = not tested) ? ?LOWER EXTREMITY SPECIAL TESTS:  ?{LEspecialtests:26242} ? ?FUNCTIONAL TESTS:  ?{Functional  tests:24029} ? ?GAIT: ?Distance walked: *** ?Assistive device utilized: {Assistive devices:23999} ?Level of assistance: {Levels of assistance:24026} ?Comments: *** ? ? ? ?TODAY'S TREATMENT: ?*** ? ? ?PATIENT EDUCATION:  ?Education details: *** ?Person educated: {Person educated:25204} ?Education method: {Education Method:25205} ?Education comprehension: {Education Comprehension:25206} ? ? ?HOME EXERCISE PROGRAM: ?*** ? ?ASSESSMENT: ? ?CLINICAL IMPRESSION: ?Patient is a *** y.o. *** who was seen today for physical therapy evaluation and treatment for ***.  ? ? ?OBJECTIVE IMPAIRMENTS {opptimpairments:25111}.  ? ?ACTIVITY LIMITATIONS {activity limitations:25113}.  ? ?PERSONAL FACTORS {Personal factors:25162} are also affecting patient's functional outcome.  ? ? ?REHAB POTENTIAL: {rehabpotential:25112} ? ?CLINICAL DECISION MAKING: {clinical decision making:25114} ? ?EVALUATION COMPLEXITY: {Evaluation complexity:25115} ? ? ?GOALS: ?Goals reviewed with patient? {yes/no:20286} ? ?SHORT TERM GOALS: Target date: {follow up:25551}  (Remove Blue Hyperlink) ? ?*** ?Baseline: ?Goal status: {GOALSTATUS:25110} ? ?2.  *** ?Baseline:  ?Goal status: {GOALSTATUS:25110} ? ?3.  *** ?Baseline:  ?Goal status: {GOALSTATUS:25110} ? ?4.  *** ?Baseline:  ?Goal status: {GOALSTATUS:25110} ? ?5.  *** ?Baseline:  ?Goal status: {GOALSTATUS:25110} ? ?6.  *** ?Baseline:  ?Goal status: {GOALSTATUS:25110} ? ?LONG TERM GOALS: Target date: {follow up:25551}  (Remove Blue Hyperlink) ? ?*** ?Baseline:  ?  Goal status: {GOALSTATUS:25110} ? ?2.  *** ?Baseline:  ?Goal status: {GOALSTATUS:25110} ? ?3.  *** ?Baseline:  ?Goal status: {GOALSTATUS:25110} ? ?4.  *** ?Baseline:  ?Goal status: {GOALSTATUS:25110} ? ?5.  *** ?Baseline:  ?Goal status: {GOALSTATUS:25110} ? ?6.  *** ?Baseline:  ?Goal status: {GOALSTATUS:25110} ? ? ?PLAN: ?PT FREQUENCY: {rehab frequency:25116} ? ?PT DURATION: {rehab duration:25117} ? ?PLANNED INTERVENTIONS: {rehab planned  interventions:25118::"Therapeutic exercises","Therapeutic activity","Neuromuscular re-education","Balance training","Gait training","Patient/Family education","Joint mobilization"} ? ?PLAN FOR NEXT SESSION: *** ? ? ?Joellyn Rued, PT ?5

## 2022-03-22 ENCOUNTER — Ambulatory Visit: Payer: Medicare HMO

## 2022-03-30 ENCOUNTER — Ambulatory Visit: Payer: Medicare HMO

## 2022-03-30 ENCOUNTER — Other Ambulatory Visit: Payer: Self-pay | Admitting: Internal Medicine

## 2022-03-31 ENCOUNTER — Inpatient Hospital Stay: Payer: Medicare HMO | Attending: Hematology

## 2022-03-31 ENCOUNTER — Inpatient Hospital Stay: Payer: Medicare HMO | Admitting: Hematology

## 2022-04-02 DIAGNOSIS — J449 Chronic obstructive pulmonary disease, unspecified: Secondary | ICD-10-CM | POA: Diagnosis not present

## 2022-04-11 NOTE — Therapy (Addendum)
OUTPATIENT PHYSICAL THERAPY ORTHO EVALUATION/DC SUMMARY   Patient Name: Anita Gonzalez MRN: [id number] DOB:03/16/1946, 76 y.o., female Today's Date: 04/12/2022   PCP: Osei-Bonsu, George, MD REFERRING PROVIDER: Osei-Bonsu, George, MD PHYSICAL THERAPY DISCHARGE SUMMARY  Visits from Start of Care: 1  Current functional level related to goals / functional outcomes: UTA   Remaining deficits: UTA   Education / Equipment: RW, HEP   Patient agrees to discharge. Patient goals were not met. Patient is being discharged due to not returning since the last visit.   PT End of Session - 04/12/22 1554     Visit Number 1    Number of Visits 8    Date for PT Re-Evaluation 06/07/22    Authorization Type [employer]    PT Start Time 1455    PT Stop Time 1530    PT Time Calculation (min) 35 min    Activity Tolerance Patient limited by pain    Behavior During Therapy WFL for tasks assessed/performed             Past Medical History:  Diagnosis Date   Acute kidney injury (HCC) 01/11/2015   Alcohol abuse, in remission    Anemia    Anemia, iron deficiency 01/28/2015   Arthritis    Asthma    Bronchiectasis (HCC) 03/07/2016   Bronchitis    COPD (chronic obstructive pulmonary disease) (HCC)    Diabetes mellitus without complication (HCC)    type 2   DM II (diabetes mellitus, type II), controlled (HCC) 01/06/2015   Dyspnea and respiratory abnormality 04/06/2015   Dysrhythmia    hx of palpitations   E-coli UTI 01/11/2015   Essential hypertension 01/06/2015   GERD (gastroesophageal reflux disease)    Hay fever    Hepatitis    c treated with pills   HLD (hyperlipidemia) 01/06/2015   Hypertension    IBS (irritable bowel syndrome)    ILD (interstitial lung disease) (HCC) 12/11/2016   Leg cramps, sleep related 12/01/2015   Pneumonia    Rheumatoid arthritis (HCC) 1996   Sinus tachycardia 01/11/2015   LT monitor 01/2019: normal sinus rhythm, occ short bursts of PAT, accelerated junctional rhythm,  no sig arrhythmias.    Past Surgical History:  Procedure Laterality Date   ABDOMINAL HYSTERECTOMY     BALLOON DILATION N/A 03/15/2018   Procedure: BALLOON DILATION;  Surgeon: Hung, Patrick, MD;  Location: [facility];  Service: Endoscopy;  Laterality: N/A;   CARPAL TUNNEL RELEASE Left 04/19/2018   Procedure: CARPAL TUNNEL RELEASE;  Surgeon: Cabbell, Kyle, MD;  Location: [facility];  Service: Neurosurgery;  Laterality: Left;  CARPAL TUNNEL RELEASE   CESAREAN SECTION     COLONOSCOPY WITH PROPOFOL N/A 04/02/2015   Procedure: COLONOSCOPY WITH PROPOFOL;  Surgeon: Patrick Hung, MD;  Location: [facility];  Service: Endoscopy;  Laterality: N/A;   ESOPHAGOGASTRODUODENOSCOPY (EGD) WITH PROPOFOL N/A 04/02/2015   Procedure: ESOPHAGOGASTRODUODENOSCOPY (EGD) WITH PROPOFOL;  Surgeon: Patrick Hung, MD;  Location: [facility];  Service: Endoscopy;  Laterality: N/A;   ESOPHAGOGASTRODUODENOSCOPY (EGD) WITH PROPOFOL N/A 03/15/2018   Procedure: ESOPHAGOGASTRODUODENOSCOPY (EGD) WITH PROPOFOL;  Surgeon: Hung, Patrick, MD;  Location: [facility];  Service: Endoscopy;  Laterality: N/A;   HEMORRHOID SURGERY     HERNIA REPAIR     HERNIA REPAIR     Patient Active Problem List   Diagnosis Date Noted   Diabetic renal disease (HCC) 09/27/2021   Dyspepsia 09/27/2021   Gout 09/27/2021   Hypertensive heart disease without congestive heart failure 09/27/2021   Iron deficiency   09/27/2021   Moderate persistent asthma, uncomplicated 97/98/9211   Neuropathy 09/27/2021   Obese 09/27/2021   Other seasonal allergic rhinitis 09/27/2021   Oxygen dependent 09/27/2021   Polyneuropathy due to type 2 diabetes mellitus ([place]) 09/27/2021   Polyneuropathy in diseases classified elsewhere ([place]) 09/27/2021   Vitamin D deficiency 09/27/2021   Arteriosclerosis of coronary artery 07/26/2021   Chronic obstructive pulmonary disease ([place]) 07/26/2021   Gastroesophageal reflux disease 07/26/2021   Hepatic fibrosis, advanced fibrosis 07/26/2021    Osteoarthritis of knee 06/04/2020   Low back pain 06/03/2020   Bilateral leg pain 07/09/2018   ILD (interstitial lung disease) ([place]) 12/11/2016   Bronchiectasis ([place]) 03/07/2016   Asthma 03/06/2016   Unspecified viral hepatitis C without hepatic coma 02/18/2016   Chronic pain disorder 02/07/2016   Leg cramps, sleep related 12/01/2015   OSA (obstructive sleep apnea) 07/20/2015   Dyspnea and respiratory abnormality 04/06/2015   Hypoxemia 04/06/2015   History of rheumatoid arthritis 04/06/2015   History of smoking 04/06/2015   Anemia, iron deficiency 01/28/2015   Acute kidney injury ([place]) 01/11/2015   E-coli UTI 01/11/2015   Sinus tachycardia 01/11/2015   Anemia 01/06/2015   Essential hypertension 01/06/2015   Type 2 diabetes mellitus without complication ([place]) 94/17/4081   HLD (hyperlipidemia) 01/06/2015   Rheumatoid arthritis ([place]) 01/06/2015    ONSET DATE: 03/09/22(referral date)  REFERRING DIAG: R26.81 (ICD-10-CM) - Unsteadiness on feet  THERAPY DIAG: gait instability   Rationale for Evaluation and Treatment Rehabilitation  SUBJECTIVE:                                                                                                                                                                                              SUBJECTIVE STATEMENT: Describes a 10 year history of R hip and low back pain, previous treatment of cortisone injections with no benefit. Pt accompanied by: self  PERTINENT HISTORY: RA  PAIN:  Are you having pain? Yes: NPRS scale: 9/10 Pain location: R hip Pain description: sharp Aggravating factors: activity Relieving factors: heat  PRECAUTIONS: Fall  WEIGHT BEARING RESTRICTIONS No  FALLS: Has patient fallen in last 6 months? Yes. Number of falls 2  LIVING ENVIRONMENT: Lives with: lives with their family Lives in: House/apartment Stairs: No Has following equipment at home: Single point cane and Walker - 4 wheeled  PLOF: Needs assistance  with gait  PATIENT GOALS To reduce an manage my pain  OBJECTIVE:   DIAGNOSTIC FINDINGS:  CLINICAL DATA:  Fall.   EXAM: LUMBAR SPINE - COMPLETE 4+ VIEW   COMPARISON:  None.   FINDINGS: No acute fracture. Grade 1 stepwise anterolisthesis of  L3 on L4, L4 on L5, and L5 on S1, likely due to severe facet arthropathy. Mild disc height loss at L3-L4 and L4-L5. Moderate to severe disc height loss at L5-S1. Vertebral body heights are preserved. The bones are osteopenic. Atherosclerotic vascular calcifications of the abdominal aorta.   IMPRESSION: 1. No definite lumbar spine fracture. 2. Stepwise grade 1 anterolisthesis at L3-L4, L4-L5, and L5-S1 due to severe facet arthropathy.     Electronically Signed   By: Titus Dubin M.D.   On: 07/13/2017 13:18  CLINICAL DATA:  Lower back and left hip pain after fall.   EXAM: DG HIP (WITH OR WITHOUT PELVIS) 2-3V LEFT   COMPARISON:  None.   FINDINGS: There is no evidence of hip fracture or dislocation. Mild degenerative changes of the bilateral hip and sacroiliac joints. Severe degenerative changes of the lower lumbar spine. Osteopenia.   IMPRESSION: 1. No acute fracture or malalignment. If occult hip fracture is suspected or if the patient is unable to bear weight, MRI is the preferred modality for further evaluation. 2. Mild bilateral hip joint degenerative changes.     Electronically Signed   By: Titus Dubin M.D.   On: 07/13/2017 13:13  COGNITION: Overall cognitive status: Within functional limits for tasks assessed   SENSATION: WFL  COORDINATION: Limited by pain  EDEMA:  None noted  MUSCLE TONE: WFL   MUSCLE LENGTH: NT  DTRs:  NT  POSTURE: rounded shoulders, forward head, and flexed hip and knees  LOWER EXTREMITY ROM:   WFL for tasks assessed  Active  Right Eval Left Eval  Hip flexion    Hip extension    Hip abduction    Hip adduction    Hip internal rotation    Hip external rotation     Knee flexion    Knee extension    Ankle dorsiflexion    Ankle plantarflexion    Ankle inversion    Ankle eversion     (Blank rows = not tested)  LOWER EXTREMITY MMT:    MMT Right Eval Left Eval  Hip flexion 3+ 3+  Hip extension 3+ 3+  Hip abduction 3+ 3+  Hip adduction    Hip internal rotation    Hip external rotation    Knee flexion 3+ 3+  Knee extension 3+ 3+  Ankle dorsiflexion    Ankle plantarflexion 3+ 3+  Ankle inversion    Ankle eversion    (Blank rows = not tested)    TRANSFERS: Requires marked UE support   GAIT: Gait pattern: step to pattern, decreased step length- Right, decreased step length- Left, decreased stance time- Right, decreased stance time- Left, decreased stride length, decreased hip/knee flexion- Right, and decreased hip/knee flexion- Left Distance walked: 45f x2 Assistive device utilized: Walker - 4 wheeled Level of assistance: Modified independence Comments: antalgic gait  FUNCTIONAL TESTs:  5 times sit to stand: 28s  PATIENT SURVEYS:  FOTO 38  TODAY'S TREATMENT:  eval   PATIENT EDUCATION: Education details: Discussed eval findings, rehab rationale and POC and patient is in agreement  Person educated: Patient Education method: Explanation Education comprehension: verbalized understanding and needs further education   HOME EXERCISE PROGRAM: TBD    GOALS: Goals reviewed with patient? Yes  SHORT TERM GOALS: Target date: 04/26/2022  Patient to demonstrate independence in HEP  Baseline:TBD Goal status: INITIAL  LONG TERM GOALS: Target date: 05/10/2022  2072fambulation with 4WW Baseline: 7539foal status: INITIAL  2.  Decrease 5x STS score to 25s Baseline:  28s with UE support Goal status: INITIAL  3.  Increase BLE strength to 4-/5 Baseline: 3+/5  Goal status: INITIAL  4.  Decrease pain to 6/10 worst Baseline: 9/10 Goal status: INITIAL  5. Increase FOTO score to 45  Baseline: 38  Goal status: Initial      ASSESSMENT:  CLINICAL IMPRESSION: Patient is a 76 y.o. female who was seen today for physical therapy evaluation and treatment for decreased functional mobility due to chronic hip and back back pain. Symptoms have been present for 10+ years and conservative treatment has not helped to date.  Imaging studies 5 years ago demo marked arthritic changes in lumbar spine but only minimal degenerative changes in B hips.  Patient requires 4WW to mobilize.  Evaluation scope limited due to poor tolerance of positions   OBJECTIVE IMPAIRMENTS Abnormal gait, decreased activity tolerance, decreased balance, decreased endurance, decreased mobility, difficulty walking, decreased ROM, decreased strength, postural dysfunction, and pain.   ACTIVITY LIMITATIONS carrying, lifting, bending, standing, squatting, and stairs  PARTICIPATION LIMITATIONS: meal prep, cleaning, laundry, driving, shopping, and community activity  PERSONAL FACTORS Age, Past/current experiences, Time since onset of injury/illness/exacerbation, and Transportation are also affecting patient's functional outcome.   REHAB POTENTIAL: Fair based on chronic nature of symptoms  CLINICAL DECISION MAKING: Evolving/moderate complexity  EVALUATION COMPLEXITY: Moderate  PLAN: PT FREQUENCY: 2x/week  PT DURATION: 4 weeks  PLANNED INTERVENTIONS: Therapeutic exercises, Therapeutic activity, Neuromuscular re-education, Balance training, Gait training, Patient/Family education, Joint mobilization, Stair training, Aquatic Therapy, and Re-evaluation  PLAN FOR NEXT SESSION: HEP instruction, gait training, f/u on possible ortho consult   Jeffrey M Ziemba, PT 04/12/2022, 3:56 PM  Referring diagnosis? gait instability Treatment diagnosis? (if different than referring diagnosis) RLE and low back pain, chronic What was this (referring dx) caused by? [] Surgery [] Fall [x] Ongoing issue [x] Arthritis [] Other: ____________  Laterality: [x]  Rt [] Lt [] Both  Check all possible CPT codes:  *CHOOSE 10 OR LESS*    [x] 97110 (Therapeutic Exercise)  [] 92507 (SLP Treatment)  [x] 97112 (Neuro Re-ed)   [] 92526 (Swallowing Treatment)   [x] 97116 (Gait Training)   [] 97129 (Cognitive Training, 1st 15 minutes) [] 97140 (Manual Therapy)   [] 97130 (Cognitive Training, each add'l 15 minutes)  [x] 97164 (Re-evaluation)                              [] Other, List CPT Code ____________  [x] 97530 (Therapeutic Activities)     [x] 97535 (Self Care)   [] All codes above (97110 - 97535)  [] 97012 (Mechanical Traction)  [] 97014 (E-stim Unattended)  [] 97032 (E-stim manual)  [] 97033 (Ionto)  [] 97035 (Ultrasound)  [] 97760 (Orthotic Fit) [] 97750 (Physical Performance Training) [x] 97113 (Aquatic Therapy) [] 97034 (Contrast Bath) [] 97018 (Paraffin) [] 97597 (Wound Care 1st 20 sq cm) [] 97598 (Wound Care each add'l 20 sq cm) [] 97016 (Vasopneumatic Device) [] 97760 (Orthotic Training) [] 97761 (Prosthetic Training)       

## 2022-04-12 ENCOUNTER — Ambulatory Visit: Payer: Medicare HMO | Attending: Internal Medicine

## 2022-04-12 DIAGNOSIS — M25561 Pain in right knee: Secondary | ICD-10-CM | POA: Diagnosis not present

## 2022-04-12 DIAGNOSIS — M25551 Pain in right hip: Secondary | ICD-10-CM | POA: Insufficient documentation

## 2022-04-12 DIAGNOSIS — G8929 Other chronic pain: Secondary | ICD-10-CM | POA: Diagnosis not present

## 2022-04-12 DIAGNOSIS — R6889 Other general symptoms and signs: Secondary | ICD-10-CM | POA: Diagnosis not present

## 2022-04-12 DIAGNOSIS — R2681 Unsteadiness on feet: Secondary | ICD-10-CM | POA: Diagnosis not present

## 2022-04-13 ENCOUNTER — Encounter: Payer: Self-pay | Admitting: Hematology

## 2022-04-20 ENCOUNTER — Telehealth: Payer: Self-pay | Admitting: Hematology

## 2022-04-20 ENCOUNTER — Other Ambulatory Visit: Payer: Self-pay | Admitting: Pulmonary Disease

## 2022-04-20 NOTE — Telephone Encounter (Signed)
Rescheduled missed appointment per patient's request. Patient is aware.

## 2022-04-21 ENCOUNTER — Telehealth: Payer: Self-pay | Admitting: Hematology

## 2022-04-21 NOTE — Telephone Encounter (Signed)
Called pt per 6/9 inbasket, pt req to r/s missed may appt, informed pt she already had 7/6 appt scheduled, pt informed me she would keep 7/6 appt.

## 2022-04-25 NOTE — Therapy (Deleted)
OUTPATIENT PHYSICAL THERAPY TREATMENT NOTE   Patient Name: Anita Gonzalez MRN: [id number] DOB:02-Mar-1946, 76 y.o., female Today's Date: 04/25/2022  PCP: Benito Mccreedy, MD REFERRING PROVIDER: Benito Mccreedy, MD  END OF SESSION:    Past Medical History:  Diagnosis Date   Acute kidney injury ([place]) 01/11/2015   Alcohol abuse, in remission    Anemia    Anemia, iron deficiency 01/28/2015   Arthritis    Asthma    Bronchiectasis ([place]) 03/07/2016   Bronchitis    COPD (chronic obstructive pulmonary disease) ([place])    Diabetes mellitus without complication ([place])    type 2   DM II (diabetes mellitus, type II), controlled ([place]) 01/06/2015   Dyspnea and respiratory abnormality 04/06/2015   Dysrhythmia    hx of palpitations   E-coli UTI 01/11/2015   Essential hypertension 01/06/2015   GERD (gastroesophageal reflux disease)    Hay fever    Hepatitis    c treated with pills   HLD (hyperlipidemia) 01/06/2015   Hypertension    IBS (irritable bowel syndrome)    ILD (interstitial lung disease) ([place]) 12/11/2016   Leg cramps, sleep related 12/01/2015   Pneumonia    Rheumatoid arthritis ([place]) 1996   Sinus tachycardia 01/11/2015   LT monitor 01/2019: normal sinus rhythm, occ short bursts of PAT, accelerated junctional rhythm, no sig arrhythmias.    Past Surgical History:  Procedure Laterality Date   ABDOMINAL HYSTERECTOMY     BALLOON DILATION N/A 03/15/2018   Procedure: BALLOON DILATION;  Surgeon: Carol Ada, MD;  Location: [facility];  Service: Endoscopy;  Laterality: N/A;   CARPAL TUNNEL RELEASE Left 04/19/2018   Procedure: CARPAL TUNNEL RELEASE;  Surgeon: Ashok Pall, MD;  Location: [facility];  Service: Neurosurgery;  Laterality: Left;  CARPAL TUNNEL RELEASE   CESAREAN SECTION     COLONOSCOPY WITH PROPOFOL N/A 04/02/2015   Procedure: COLONOSCOPY WITH PROPOFOL;  Surgeon: Carol Ada, MD;  Location: [facility];  Service: Endoscopy;  Laterality: N/A;   ESOPHAGOGASTRODUODENOSCOPY (EGD) WITH  PROPOFOL N/A 04/02/2015   Procedure: ESOPHAGOGASTRODUODENOSCOPY (EGD) WITH PROPOFOL;  Surgeon: Carol Ada, MD;  Location: [facility];  Service: Endoscopy;  Laterality: N/A;   ESOPHAGOGASTRODUODENOSCOPY (EGD) WITH PROPOFOL N/A 03/15/2018   Procedure: ESOPHAGOGASTRODUODENOSCOPY (EGD) WITH PROPOFOL;  Surgeon: Carol Ada, MD;  Location: [facility];  Service: Endoscopy;  Laterality: N/A;   HEMORRHOID SURGERY     HERNIA REPAIR     HERNIA REPAIR     Patient Active Problem List   Diagnosis Date Noted   Diabetic renal disease ([place]) 09/27/2021   Dyspepsia 09/27/2021   Gout 09/27/2021   Hypertensive heart disease without congestive heart failure 09/27/2021   Iron deficiency 09/27/2021   Moderate persistent asthma, uncomplicated [id number]   Neuropathy 09/27/2021   Obese 09/27/2021   Other seasonal allergic rhinitis 09/27/2021   Oxygen dependent 09/27/2021   Polyneuropathy due to type 2 diabetes mellitus ([place]) 09/27/2021   Polyneuropathy in diseases classified elsewhere ([place]) 09/27/2021   Vitamin D deficiency 09/27/2021   Arteriosclerosis of coronary artery 07/26/2021   Chronic obstructive pulmonary disease ([place]) 07/26/2021   Gastroesophageal reflux disease 07/26/2021   Hepatic fibrosis, advanced fibrosis 07/26/2021   Osteoarthritis of knee 06/04/2020   Low back pain 06/03/2020   Bilateral leg pain 07/09/2018   ILD (interstitial lung disease) ([place]) 12/11/2016   Bronchiectasis ([place]) 03/07/2016   Asthma 03/06/2016   Unspecified viral hepatitis C without hepatic coma 02/18/2016   Chronic pain disorder 02/07/2016   Leg cramps, sleep related 12/01/2015   OSA (  obstructive sleep apnea) 07/20/2015   Dyspnea and respiratory abnormality 04/06/2015   Hypoxemia 04/06/2015   History of rheumatoid arthritis 04/06/2015   History of smoking 04/06/2015   Anemia, iron deficiency 01/28/2015   Acute kidney injury ([place]) 01/11/2015   E-coli UTI 01/11/2015   Sinus tachycardia 01/11/2015   Anemia  01/06/2015   Essential hypertension 01/06/2015   Type 2 diabetes mellitus without complication ([place]) [id number]   HLD (hyperlipidemia) 01/06/2015   Rheumatoid arthritis ([place]) 01/06/2015    REFERRING DIAG: R26.81 (ICD-10-CM) - Unsteadiness on feet  THERAPY DIAG: gait instability   Rationale for Evaluation and Treatment Rehabilitation  PERTINENT HISTORY: RA  PRECAUTIONS: Fall  SUBJECTIVE: ***  PAIN:  Are you having pain? {[id number]}   OBJECTIVE: (objective measures completed at initial evaluation unless otherwise dated)   OBJECTIVE:    DIAGNOSTIC FINDINGS:  CLINICAL DATA:  Fall.   EXAM: LUMBAR SPINE - COMPLETE 4+ VIEW   COMPARISON:  None.   FINDINGS: No acute fracture. Grade 1 stepwise anterolisthesis of L3 on L4, L4 on L5, and L5 on S1, likely due to severe facet arthropathy. Mild disc height loss at L3-L4 and L4-L5. Moderate to severe disc height loss at L5-S1. Vertebral body heights are preserved. The bones are osteopenic. Atherosclerotic vascular calcifications of the abdominal aorta.   IMPRESSION: 1. No definite lumbar spine fracture. 2. Stepwise grade 1 anterolisthesis at L3-L4, L4-L5, and L5-S1 due to severe facet arthropathy.     Electronically Signed   By: Titus Dubin M.D.   On: 07/13/2017 13:18   CLINICAL DATA:  Lower back and left hip pain after fall.   EXAM: DG HIP (WITH OR WITHOUT PELVIS) 2-3V LEFT   COMPARISON:  None.   FINDINGS: There is no evidence of hip fracture or dislocation. Mild degenerative changes of the bilateral hip and sacroiliac joints. Severe degenerative changes of the lower lumbar spine. Osteopenia.   IMPRESSION: 1. No acute fracture or malalignment. If occult hip fracture is suspected or if the patient is unable to bear weight, MRI is the preferred modality for further evaluation. 2. Mild bilateral hip joint degenerative changes.     Electronically Signed   By: Titus Dubin M.D.   On: 07/13/2017  13:13   COGNITION: Overall cognitive status: Within functional limits for tasks assessed             SENSATION: WFL   COORDINATION: Limited by pain   EDEMA:  None noted   MUSCLE TONE: WFL     MUSCLE LENGTH: NT   DTRs:  NT   POSTURE: rounded shoulders, forward head, and flexed hip and knees   LOWER EXTREMITY ROM:   WFL for tasks assessed   Active  Right Eval Left Eval  Hip flexion      Hip extension      Hip abduction      Hip adduction      Hip internal rotation      Hip external rotation      Knee flexion      Knee extension      Ankle dorsiflexion      Ankle plantarflexion      Ankle inversion      Ankle eversion       (Blank rows = not tested)   LOWER EXTREMITY MMT:     MMT Right Eval Left Eval  Hip flexion 3+ 3+  Hip extension 3+ 3+  Hip abduction 3+ 3+  Hip adduction      Hip internal  rotation      Hip external rotation      Knee flexion 3+ 3+  Knee extension 3+ 3+  Ankle dorsiflexion      Ankle plantarflexion 3+ 3+  Ankle inversion      Ankle eversion      (Blank rows = not tested)       TRANSFERS: Requires marked UE support     GAIT: Gait pattern: step to pattern, decreased step length- Right, decreased step length- Left, decreased stance time- Right, decreased stance time- Left, decreased stride length, decreased hip/knee flexion- Right, and decreased hip/knee flexion- Left Distance walked: 47ft x2 Assistive device utilized: Environmental consultant - 4 wheeled Level of assistance: Modified independence Comments: antalgic gait   FUNCTIONAL TESTs:  5 times sit to stand: 28s   PATIENT SURVEYS:  FOTO 38   TODAY'S TREATMENT:  eval     PATIENT EDUCATION: Education details: Discussed eval findings, rehab rationale and POC and patient is in agreement  Person educated: Patient Education method: Explanation Education comprehension: verbalized understanding and needs further education     HOME EXERCISE PROGRAM: TBD       GOALS: Goals  reviewed with patient? Yes   SHORT TERM GOALS: Target date: 04/26/2022   Patient to demonstrate independence in HEP  Baseline:TBD Goal status: INITIAL   LONG TERM GOALS: Target date: 05/10/2022   23ft ambulation with 4WW Baseline: 84ft Goal status: INITIAL   2.  Decrease 5x STS score to 25s Baseline: 28s with UE support Goal status: INITIAL   3.  Increase BLE strength to 4-/5 Baseline: 3+/5  Goal status: INITIAL   4.  Decrease pain to 6/10 worst Baseline: 9/10 Goal status: INITIAL   5. Increase FOTO score to 45            Baseline: 38            Goal status: Initial        ASSESSMENT:   CLINICAL IMPRESSION: Patient is a 76 y.o. female who was seen today for physical therapy evaluation and treatment for decreased functional mobility due to chronic hip and back back pain. Symptoms have been present for 10+ years and conservative treatment has not helped to date.  Imaging studies 5 years ago demo marked arthritic changes in lumbar spine but only minimal degenerative changes in B hips.  Patient requires 4WW to mobilize.  Evaluation scope limited due to poor tolerance of positions     OBJECTIVE IMPAIRMENTS Abnormal gait, decreased activity tolerance, decreased balance, decreased endurance, decreased mobility, difficulty walking, decreased ROM, decreased strength, postural dysfunction, and pain.    ACTIVITY LIMITATIONS carrying, lifting, bending, standing, squatting, and stairs   PARTICIPATION LIMITATIONS: meal prep, cleaning, laundry, driving, shopping, and community activity   PERSONAL FACTORS Age, Past/current experiences, Time since onset of injury/illness/exacerbation, and Transportation are also affecting patient's functional outcome.    REHAB POTENTIAL: Fair based on chronic nature of symptoms   CLINICAL DECISION MAKING: Evolving/moderate complexity   EVALUATION COMPLEXITY: Moderate   PLAN: PT FREQUENCY: 2x/week   PT DURATION: 4 weeks   PLANNED INTERVENTIONS:  Therapeutic exercises, Therapeutic activity, Neuromuscular re-education, Balance training, Gait training, Patient/Family education, Joint mobilization, Stair training, Aquatic Therapy, and Re-evaluation   PLAN FOR NEXT SESSION: HEP instruction, gait training, f/u on possible ortho consult    Lanice Shirts, PT 04/25/2022, 3:17 PM

## 2022-04-27 ENCOUNTER — Ambulatory Visit: Payer: Medicare HMO | Attending: Internal Medicine

## 2022-04-27 ENCOUNTER — Telehealth: Payer: Self-pay

## 2022-04-27 NOTE — Telephone Encounter (Signed)
TC due to missed appointment, spoke with patient, she had forgotten appointment, reminded her of next visit date  and time

## 2022-05-03 DIAGNOSIS — J449 Chronic obstructive pulmonary disease, unspecified: Secondary | ICD-10-CM | POA: Diagnosis not present

## 2022-05-04 ENCOUNTER — Ambulatory Visit: Payer: Medicare HMO

## 2022-05-11 ENCOUNTER — Ambulatory Visit: Payer: Medicare HMO

## 2022-05-18 ENCOUNTER — Ambulatory Visit: Payer: Medicare HMO | Attending: Internal Medicine

## 2022-05-18 ENCOUNTER — Inpatient Hospital Stay: Payer: Medicare HMO | Attending: Hematology

## 2022-05-18 ENCOUNTER — Inpatient Hospital Stay: Payer: Medicare HMO | Admitting: Hematology

## 2022-05-19 ENCOUNTER — Other Ambulatory Visit: Payer: Self-pay | Admitting: Internal Medicine

## 2022-05-22 NOTE — Telephone Encounter (Signed)
Pt's pharmacy is requesting a refill on albuterol inhaler. Would Dr. Tenny Craw like to refill this medication? Please address

## 2022-05-24 ENCOUNTER — Other Ambulatory Visit: Payer: Self-pay | Admitting: Internal Medicine

## 2022-05-25 ENCOUNTER — Ambulatory Visit: Payer: Medicare HMO

## 2022-05-30 ENCOUNTER — Other Ambulatory Visit: Payer: Self-pay | Admitting: Internal Medicine

## 2022-06-01 ENCOUNTER — Ambulatory Visit: Payer: Medicare HMO

## 2022-06-09 DIAGNOSIS — R6889 Other general symptoms and signs: Secondary | ICD-10-CM | POA: Diagnosis not present

## 2022-06-09 DIAGNOSIS — E1129 Type 2 diabetes mellitus with other diabetic kidney complication: Secondary | ICD-10-CM | POA: Diagnosis not present

## 2022-06-09 DIAGNOSIS — I119 Hypertensive heart disease without heart failure: Secondary | ICD-10-CM | POA: Diagnosis not present

## 2022-06-09 DIAGNOSIS — E782 Mixed hyperlipidemia: Secondary | ICD-10-CM | POA: Diagnosis not present

## 2022-06-09 DIAGNOSIS — M2550 Pain in unspecified joint: Secondary | ICD-10-CM | POA: Diagnosis not present

## 2022-06-09 DIAGNOSIS — M25551 Pain in right hip: Secondary | ICD-10-CM | POA: Diagnosis not present

## 2022-06-09 DIAGNOSIS — R2681 Unsteadiness on feet: Secondary | ICD-10-CM | POA: Diagnosis not present

## 2022-06-09 DIAGNOSIS — M62838 Other muscle spasm: Secondary | ICD-10-CM | POA: Diagnosis not present

## 2022-06-28 DIAGNOSIS — E1129 Type 2 diabetes mellitus with other diabetic kidney complication: Secondary | ICD-10-CM | POA: Diagnosis not present

## 2022-06-28 DIAGNOSIS — R6889 Other general symptoms and signs: Secondary | ICD-10-CM | POA: Diagnosis not present

## 2022-06-28 DIAGNOSIS — M62838 Other muscle spasm: Secondary | ICD-10-CM | POA: Diagnosis not present

## 2022-06-28 DIAGNOSIS — E782 Mixed hyperlipidemia: Secondary | ICD-10-CM | POA: Diagnosis not present

## 2022-06-28 DIAGNOSIS — M2550 Pain in unspecified joint: Secondary | ICD-10-CM | POA: Diagnosis not present

## 2022-06-28 DIAGNOSIS — R2681 Unsteadiness on feet: Secondary | ICD-10-CM | POA: Diagnosis not present

## 2022-06-28 DIAGNOSIS — M25551 Pain in right hip: Secondary | ICD-10-CM | POA: Diagnosis not present

## 2022-06-28 DIAGNOSIS — I119 Hypertensive heart disease without heart failure: Secondary | ICD-10-CM | POA: Diagnosis not present

## 2022-07-10 ENCOUNTER — Other Ambulatory Visit: Payer: Self-pay | Admitting: Internal Medicine

## 2022-07-12 ENCOUNTER — Encounter (HOSPITAL_COMMUNITY): Payer: Self-pay

## 2022-07-12 ENCOUNTER — Emergency Department (HOSPITAL_COMMUNITY)
Admission: EM | Admit: 2022-07-12 | Discharge: 2022-07-12 | Disposition: A | Discharge status: Home / Self Care | Payer: Medicare HMO | Attending: Emergency Medicine | Admitting: Emergency Medicine

## 2022-07-12 DIAGNOSIS — J449 Chronic obstructive pulmonary disease, unspecified: Secondary | ICD-10-CM | POA: Insufficient documentation

## 2022-07-12 DIAGNOSIS — E1122 Type 2 diabetes mellitus with diabetic chronic kidney disease: Secondary | ICD-10-CM | POA: Insufficient documentation

## 2022-07-12 DIAGNOSIS — N189 Chronic kidney disease, unspecified: Secondary | ICD-10-CM | POA: Diagnosis not present

## 2022-07-12 DIAGNOSIS — M549 Dorsalgia, unspecified: Secondary | ICD-10-CM | POA: Diagnosis not present

## 2022-07-12 DIAGNOSIS — Z7982 Long term (current) use of aspirin: Secondary | ICD-10-CM | POA: Diagnosis not present

## 2022-07-12 DIAGNOSIS — I129 Hypertensive chronic kidney disease with stage 1 through stage 4 chronic kidney disease, or unspecified chronic kidney disease: Secondary | ICD-10-CM | POA: Insufficient documentation

## 2022-07-12 DIAGNOSIS — G8929 Other chronic pain: Secondary | ICD-10-CM | POA: Insufficient documentation

## 2022-07-12 DIAGNOSIS — M545 Low back pain, unspecified: Secondary | ICD-10-CM | POA: Diagnosis not present

## 2022-07-12 MED ORDER — PREDNISONE 20 MG PO TABS
60.0000 mg | ORAL_TABLET | Freq: Once | ORAL | Status: AC
  Administered 2022-07-12: 60 mg via ORAL
  Filled 2022-07-12: qty 3

## 2022-07-12 MED ORDER — METHOCARBAMOL 1000 MG/10ML IJ SOLN
1000.0000 mg | Freq: Once | INTRAVENOUS | Status: AC
  Administered 2022-07-12: 1000 mg via INTRAVENOUS
  Filled 2022-07-12: qty 1000

## 2022-07-12 MED ORDER — PREDNISONE 50 MG PO TABS: 50.0000 mg | ORAL_TABLET | Freq: Every day | ORAL | 0 refills | Status: DC

## 2022-07-12 MED ORDER — METHOCARBAMOL 500 MG PO TABS: 500.0000 mg | ORAL_TABLET | Freq: Four times a day (QID) | ORAL | 0 refills | Status: DC | PRN

## 2022-07-12 MED ORDER — KETOROLAC TROMETHAMINE 30 MG/ML IJ SOLN
15.0000 mg | Freq: Once | INTRAMUSCULAR | Status: AC
  Administered 2022-07-12: 15 mg via INTRAVENOUS
  Filled 2022-07-12: qty 1

## 2022-07-12 MED ORDER — NAPROXEN 375 MG PO TABS: 375.0000 mg | ORAL_TABLET | Freq: Two times a day (BID) | ORAL | 0 refills | Status: DC

## 2022-07-12 MED ORDER — TRAMADOL HCL 50 MG PO TABS: 50.0000 mg | ORAL_TABLET | Freq: Four times a day (QID) | ORAL | 0 refills | Status: DC | PRN

## 2022-07-12 NOTE — ED Provider Notes (Signed)
Anita Gonzalez   CSN: [id number] Arrival date & time: 07/12/22  0047     History  Chief Complaint  Patient presents with   Back Pain    Anita Gonzalez is a 76 y.o. female.  The history is provided by the patient.  Back Pain She has history of hypertension, diabetes, hyperlipidemia, chronic kidney disease, COPD, rheumatoid arthritis and comes in complaining of pain across her lower back for the last 5 days.  She has had similar pain before, but it usually resolved after 1-2 days.  She has been using topical lidocaine patches which initially gave relief, but have not been helping over the last 2 days.  She states she does have a history of rheumatoid arthritis in her hips.  She denies any weakness or numbness or tingling and denies any bowel or bladder dysfunction.  She denies any trauma or unusual activity.  She does relate that she is intolerant of oxycodone and hydrocodone.   Home Medications Prior to Admission medications   Medication Sig Start Date End Date Taking? Authorizing Provider  ADVAIR DISKUS 500-50 MCG/ACT AEPB INHALE 1 PUFF BY MOUTH 2 TIMES A DAY 03/02/22   Pricilla Riffle, MD  albuterol (PROVENTIL) (2.5 MG/3ML) 0.083% nebulizer solution USE 1 UNIT DOSE(VIAL)VIA NEBULIZER 4 TIMES A DAY AS NEEDED AS DIRECTED 02/15/21   Pricilla Riffle, MD  albuterol (VENTOLIN HFA) 108 (90 Base) MCG/ACT inhaler INHALE 2 PUFFS BY MOUTH EVERY 6 HRS AS NEEDED 05/23/22   Pricilla Riffle, MD  amLODipine (NORVASC) 5 MG tablet TAKE 1 TABLET BY MOUTH IN THE MORNING AND AT BEDTIME 07/11/22   Pricilla Riffle, MD  aspirin EC 81 MG tablet Take 1 tablet (81 mg total) by mouth daily. 12/12/18   Tereso Newcomer T, PA-C  atorvastatin (LIPITOR) 40 MG tablet ONE TABLET BY MOUTH DAILY 05/19/21   Pricilla Riffle, MD  azithromycin (ZITHROMAX) 250 MG tablet Take 1 tablet (250 mg total) by mouth as directed. 08/25/21   Martina Sinner, MD  bumetanide (BUMEX) 2 MG tablet TAKE 1 TABLET BY  MOUTH 2 TIMES A WEEK 08/12/21   Pricilla Riffle, MD  cyanocobalamin 1000 MCG tablet Take 1,000 mcg by mouth every Thursday.     [provider]  cyclobenzaprine (FLEXERIL) 10 MG tablet TAKE ONE TABLET BY MOUTH AT BEDTIME 11/25/21   Malachy Mood, MD  diclofenac Sodium (VOLTAREN) 1 % GEL APPLY 2 G TOPICALLY 4 TIMES A DAY 03/09/22   Malachy Mood, MD  diphenoxylate-atropine (LOMOTIL) 2.5-0.025 MG tablet Take 1 tablet by mouth 4 (four) times daily as needed for diarrhea or loose stools.     [provider]  docusate sodium (COLACE) 100 MG capsule Take 100 mg by mouth 2 (two) times daily as needed for mild constipation.    [provider]  ezetimibe (ZETIA) 10 MG tablet ONE TABLET BY MOUTH DAILY 11/25/21   Pricilla Riffle, MD  ferrous sulfate 325 (65 FE) MG tablet Take 325 mg by mouth 2 (two) times daily with a meal.     [provider]  fluticasone (FLONASE) 50 MCG/ACT nasal spray Place 1 spray into both nostrils daily as needed for allergies or rhinitis.    [provider]  folic acid (FOLVITE) 1 MG tablet ONE TABLET BY MOUTH DAILY 05/24/22   Pricilla Riffle, MD  gabapentin (NEURONTIN) 100 MG capsule Take 100 mg by mouth 3 (three) times daily. 06/09/22   [provider]  gabapentin (NEURONTIN) 600 MG tablet Take 600 mg by mouth 3 (three) times daily.    [provider]  losartan (COZAAR) 50 MG tablet TAKE 1 TABLET BY MOUTH DAILY 08/27/20   Pricilla Riffle, MD  metFORMIN (GLUCOPHAGE) 500 MG tablet Take 500 mg by mouth 2 (two) times daily. 05/24/22   [provider]  metFORMIN (GLUCOPHAGE-XR) 500 MG 24 hr tablet Take 500 mg by mouth 2 (two) times daily. 11/23/20   [provider]  montelukast (SINGULAIR) 10 MG tablet TAKE 1 TABLET BY MOUTH DAILY 05/19/21   Martina Sinner, MD  pantoprazole (PROTONIX) 40 MG tablet ONE TABLET BY MOUTH DAILY 12/22/21   Malachy Mood, MD  predniSONE (DELTASONE) 10 MG tablet 3 x 3 days, 2 x 3 days, 1 x 3 days then stop  08/25/21   Martina Sinner, MD  pregabalin (LYRICA) 100 MG capsule  06/30/21   [provider]  pregabalin (LYRICA) 150 MG capsule Take 150 mg by mouth 2 (two) times daily. 05/24/22   [provider]  SPIRIVA RESPIMAT 2.5 MCG/ACT AERS INHALE 2 PUFFS INTO THE LUNGS DAILY 04/20/22   Martina Sinner, MD  traZODone (DESYREL) 25 mg TABS tablet Take 25 mg by mouth at bedtime.    [provider]  Vitamin D, Ergocalciferol, (DRISDOL) 1.25 MG (50000 UNIT) CAPS capsule TAKE 1 CAPSULE BY MOUTH WEEKLY 04/04/22   Pricilla Riffle, MD      Allergies    Fish allergy; Peanuts [peanut oil]; Penicillins; Iodinated contrast media; Iodine; Percocet [oxycodone-acetaminophen]; 2,4-d dimethylamine; Peanut (diagnostic); Peanut allergen powder-dnfp; Hydrocodone; Other; Oxycodone; and Sulfa antibiotics    Review of Systems   Review of Systems  Musculoskeletal:  Positive for back pain.  All other systems reviewed and are negative.   Physical Exam Updated Vital Signs BP (!) 123/98 (BP Location: Right Arm)   Pulse 91   Temp 99.6 F (37.6 C) (Oral)   Resp 19   Ht  (1.549 m)   Wt 85.7 kg   SpO2 100%   BMI 35.71 kg/m  Physical Exam Vitals and nursing Gonzalez reviewed.   76 year old female, resting comfortably and in no acute distress. Vital signs are significant for elevated blood pressure. Oxygen saturation is 100%, which is normal. Head is normocephalic and atraumatic. PERRLA, EOMI. Oropharynx is clear. Neck is nontender and supple without adenopathy or JVD. Back is moderately tender in the mid and lower lumbar area with moderate bilateral paralumbar spasm.  Straight leg raise is positive at 30 degrees bilaterally.  There is no CVA tenderness. Lungs are clear without rales, wheezes, or rhonchi. Chest is nontender. Heart has regular rate and rhythm without murmur. Abdomen is soft, flat, nontender. Extremities have no cyanosis or edema, full range of motion is present. Skin is  warm and dry without rash. Neurologic: Mental status is normal, cranial nerves are intact, normal and symmetric strength in all leg muscles, normal sensation in legs.  ED Results / Procedures / Treatments    Radiology No results found.  Procedures Procedures  Cardiac monitor shows normal sinus rhythm, per my interpretation.  Medications Ordered in ED Medications - No data to display  ED Course/ Medical Decision Making/ A&P                           Medical Decision Making Risk Prescription drug management.   Low back pain which is most likely musculoskeletal.  Consider  herniated nucleus pulposus, arthritis of cervical spine, spinal stenosis.  Doubt aortic aneurysm, urolithiasis.  Old records are reviewed, and on 16/05/2018 she had an abdominal ultrasound showing normal caliber of the abdominal aorta.  I have ordered intravenous ketorolac, methocarbamol, oral prednisone and patient will be reassessed.  Following above-noted treatment, patient did Gonzalez significant improvement in her pain although not complete relief.  She is felt to be safe for discharge at this point.  I have ordered prescriptions for naproxen, prednisone, methocarbamol, tramadol.  Patient has requested these be sent to a mail order pharmacy.  I have explained to her that she would be without medication for several days until the medication was sent and is suggested she might prefer to have it filled locally but she insists on it being sent to the mail order pharmacy.  Final Clinical Impression(s) / ED Diagnoses Final diagnoses:  Acute exacerbation of chronic low back pain    Rx / DC Orders ED Discharge Orders          Ordered    naproxen (NAPROSYN) 375 MG tablet  2 times daily        07/12/22 0451    methocarbamol (ROBAXIN) 500 MG tablet  Every 6 hours PRN        07/12/22 0451    predniSONE (DELTASONE) 50 MG tablet  Daily        07/12/22 0451    traMADol (ULTRAM) 50 MG tablet  Every 6 hours PRN         07/12/22 0451              Dione Booze, MD 07/12/22 [phone]

## 2022-07-12 NOTE — ED Triage Notes (Signed)
Pt from home BIB [facility] c/o lower back pain that radiates to bilateral flanks, hx of RA in bilateral hips. Prescribe meds have not relieved pain. Pt denies falling or injury, denies urinary s/s. VSS, A&O x4.

## 2022-07-12 NOTE — Discharge Instructions (Signed)
Apply ice for 30 minutes at a time, 4 times a day.  Continue taking your gabapentin.  Take acetaminophen as needed for additional pain relief.  You may continue using your lidocaine patches as needed.

## 2022-07-12 NOTE — ED Notes (Signed)
Pt requesting food, advised will need to wait until seen the provider see her and depends on POC, pt verbalized understanding.

## 2022-07-12 NOTE — ED Notes (Signed)
Pt reposition in bed for comfort, pt placed on her right side

## 2022-07-19 ENCOUNTER — Ambulatory Visit: Payer: Medicare HMO | Admitting: Internal Medicine

## 2022-07-31 DIAGNOSIS — M2550 Pain in unspecified joint: Secondary | ICD-10-CM | POA: Diagnosis not present

## 2022-07-31 DIAGNOSIS — M62838 Other muscle spasm: Secondary | ICD-10-CM | POA: Diagnosis not present

## 2022-07-31 DIAGNOSIS — E1129 Type 2 diabetes mellitus with other diabetic kidney complication: Secondary | ICD-10-CM | POA: Diagnosis not present

## 2022-07-31 DIAGNOSIS — E782 Mixed hyperlipidemia: Secondary | ICD-10-CM | POA: Diagnosis not present

## 2022-07-31 DIAGNOSIS — R2681 Unsteadiness on feet: Secondary | ICD-10-CM | POA: Diagnosis not present

## 2022-07-31 DIAGNOSIS — M25551 Pain in right hip: Secondary | ICD-10-CM | POA: Diagnosis not present

## 2022-07-31 DIAGNOSIS — Z0000 Encounter for general adult medical examination without abnormal findings: Secondary | ICD-10-CM | POA: Diagnosis not present

## 2022-07-31 DIAGNOSIS — R6889 Other general symptoms and signs: Secondary | ICD-10-CM | POA: Diagnosis not present

## 2022-07-31 DIAGNOSIS — I119 Hypertensive heart disease without heart failure: Secondary | ICD-10-CM | POA: Diagnosis not present

## 2022-08-01 DIAGNOSIS — R6889 Other general symptoms and signs: Secondary | ICD-10-CM | POA: Diagnosis not present

## 2022-08-02 NOTE — Progress Notes (Deleted)
Cardiology Office Note:    Date:  08/02/2022   ID:  Anita Gonzalez, DOB 12/17/1945, MRN [id number]  PCP:  Benito Mccreedy, MD   [facility] Providers Cardiologist:  Dorris Carnes, MD { Click to update primary MD,subspecialty MD or APP then REFRESH:1}    Referring MD: Benito Mccreedy, MD   Chief Complaint: ***  History of Present Illness:    Anita Gonzalez is a *** 76 y.o. female with a hx of CAD noted on CT 2017, normal Myoview 2018, HTN, HLD, junctional bradycardia resolved with stopping beta-blockers, CKD, COPD, chronic hypoxic respiratory failure on chronic oxygen  Found to be in junctional rhythm with rates of 70 in 07/2020, verapamil was stopped and amlodipine was increased for better blood pressure control.  Was scheduled for soon follow-up but did not return.  Echocardiogram was ordered for shortness of breath but was not completed.  She was last seen in our office on 07/05/2021 by Ermalinda Barrios, PA at which time she continued to report heart rate gets low and at times she gets a little lightheaded usually when she is walking or otherwise active.  She does not check her pulse just feels that it is going slow. She reported chronic O2 use but did not come in with oxygen due to no portable tank.  Cardiac monitor revealed sinus rhythm with heart rates 53 to 136 bpm, average HR 77 bpm, rare PACs, PVCs, short burst of SVT, no atrial fibrillation. No medication changes were recommended.  Echocardiogram 07/27/2021 revealed vigorous LV function with EF > [id number], grade 1 diastolic dysfunction, normal RV, mild to moderate mitral stenosis. Mild bilateral carotid artery disease on duplex 07/15/21.   Past Medical History:  Diagnosis Date   Acute kidney injury ([place]) 01/11/2015   Alcohol abuse, in remission    Anemia    Anemia, iron deficiency 01/28/2015   Arthritis    Asthma    Bronchiectasis ([place]) 03/07/2016   Bronchitis    COPD (chronic obstructive pulmonary disease) ([place])    Diabetes  mellitus without complication ([place])    type 2   DM II (diabetes mellitus, type II), controlled ([place]) 01/06/2015   Dyspnea and respiratory abnormality 04/06/2015   Dysrhythmia    hx of palpitations   E-coli UTI 01/11/2015   Essential hypertension 01/06/2015   GERD (gastroesophageal reflux disease)    Hay fever    Hepatitis    c treated with pills   HLD (hyperlipidemia) 01/06/2015   Hypertension    IBS (irritable bowel syndrome)    ILD (interstitial lung disease) ([place]) 12/11/2016   Leg cramps, sleep related 12/01/2015   Pneumonia    Rheumatoid arthritis ([place]) 1996   Sinus tachycardia 01/11/2015   LT monitor 01/2019: normal sinus rhythm, occ short bursts of PAT, accelerated junctional rhythm, no sig arrhythmias.     Past Surgical History:  Procedure Laterality Date   ABDOMINAL HYSTERECTOMY     BALLOON DILATION N/A 03/15/2018   Procedure: BALLOON DILATION;  Surgeon: Carol Ada, MD;  Location: [facility];  Service: Endoscopy;  Laterality: N/A;   CARPAL TUNNEL RELEASE Left 04/19/2018   Procedure: CARPAL TUNNEL RELEASE;  Surgeon: Ashok Pall, MD;  Location: [place];  Service: Neurosurgery;  Laterality: Left;  CARPAL TUNNEL RELEASE   CESAREAN SECTION     COLONOSCOPY WITH PROPOFOL N/A 04/02/2015   Procedure: COLONOSCOPY WITH PROPOFOL;  Surgeon: Carol Ada, MD;  Location: [facility];  Service: Endoscopy;  Laterality: N/A;   ESOPHAGOGASTRODUODENOSCOPY (EGD) WITH PROPOFOL N/A 04/02/2015  Procedure: ESOPHAGOGASTRODUODENOSCOPY (EGD) WITH PROPOFOL;  Surgeon: Carol Ada, MD;  Location: [facility];  Service: Endoscopy;  Laterality: N/A;   ESOPHAGOGASTRODUODENOSCOPY (EGD) WITH PROPOFOL N/A 03/15/2018   Procedure: ESOPHAGOGASTRODUODENOSCOPY (EGD) WITH PROPOFOL;  Surgeon: Carol Ada, MD;  Location: [facility];  Service: Endoscopy;  Laterality: N/A;   HEMORRHOID SURGERY     HERNIA REPAIR     HERNIA REPAIR      Current Medications: No outpatient medications have been marked as taking for the  08/04/22 encounter (Appointment) with Ann Maki, Lanice Schwab, NP.     Allergies:   Fish allergy; Peanuts [peanut oil]; Penicillins; Iodinated contrast media; Iodine; Percocet [oxycodone-acetaminophen]; 2,4-d dimethylamine; Peanut (diagnostic); Peanut allergen powder-dnfp; Hydrocodone; Other; Oxycodone; and Sulfa antibiotics   Social History   Socioeconomic History   Marital status: Single    Spouse name: Not on file   Number of children: Not on file   Years of education: Not on file   Highest education level: Not on file  Occupational History   Occupation: retired  Tobacco Use   Smoking status: Former    Packs/day: 0.75    Years: 25.00    Total pack years: 18.75    Types: Cigarettes    Quit date: 01/12/2011    Years since quitting: 11.5   Smokeless tobacco: Never  Vaping Use   Vaping Use: Never used  Substance and Sexual Activity   Alcohol use: Not Currently    Comment: 2 years ago (2021)   Drug use: No   Sexual activity: Not Currently  Other Topics Concern   Not on file  Social History Narrative   Not on file   Social Determinants of Health   Financial Resource Strain: Not on file  Food Insecurity: Not on file  Transportation Needs: Not on file  Physical Activity: Not on file  Stress: Not on file  Social Connections: Not on file     Family History: The patient's ***family history includes Asthma in an other family member; COPD in an other family member; Diabetes in her mother; Heart disease in an other family member; Heart failure in her mother; Hypertension in an other family member; Stroke in an other family member; Throat cancer in her brother.  ROS:   Please see the history of present illness.    *** All other systems reviewed and are negative.  Labs/Other Studies Reviewed:    The following studies were reviewed today:  Cardiac monitor 08/02/21  Patch Wear Time:  14 days and 0 hours (2022-08-30T20:18:59-0400 to 2022-09-13T20:19:01-399)   Predominant rhythm  is SR   Rates 53 t o136 bpm  Average HR 77 bpm    Rare PACs, PVCs   SHort bursts of SVT   Fastest 13 beats at 193 bpm    Longest 11.2 sec at 146 bpm No diary entries    Echo 07/27/21  1. There is a dynamic LV gradient . Peak gradient of 33 mmHg with an  increase to 49 mmHG with Valsalva. . Left ventricular ejection fraction,  by estimation, is >75%. The left ventricle has hyperdynamic function. The  left ventricle has no regional wall  motion abnormalities. Left ventricular diastolic parameters are consistent  with Grade I diastolic dysfunction (impaired relaxation).   2. Right ventricular systolic function is normal. The right ventricular  size is normal.   3. The mitral valve is abnormal. No evidence of mitral valve  regurgitation. Mild to moderate mitral stenosis.   4. The aortic valve is grossly normal. Aortic valve regurgitation  is not  visualized. No aortic stenosis is present.   Comparison(s): 04/13/15 EF 60-65%.   Carotid Duplex 07/15/21  Summary:  Right Carotid: Velocities in the right ICA are consistent with a 1-39%  stenosis.                Non-hemodynamically significant plaque <50% noted in the  CCA.                Essentially stable RICA velocities.   Left Carotid: Velocities in the left ICA are consistent with a 1-39%  stenosis.               Non-hemodynamically significant plaque <50% noted in the  CCA.               Essentially stable LICA velocities.   Vertebrals:  Bilateral vertebral arteries demonstrate antegrade flow.  Subclavians: Right subclavian artery flow was disturbed. Normal flow               hemodynamics were seen in the left subclavian artery.   *See table(s) above for measurements and observations.    Recent Labs: 12/02/2021: ALT 7; BUN 12; Creatinine, Ser 1.17; Hemoglobin 10.2; Platelets 281; Potassium 3.9; Sodium 143  Recent Lipid Panel    Component Value Date/Time   CHOL 131 04/09/2019 1530   TRIG 71 04/09/2019 1530   HDL 45 04/09/2019  1530   CHOLHDL 2.9 04/09/2019 1530   CHOLHDL 3 05/07/2015 1219   VLDL 15.8 05/07/2015 1219   LDLCALC 72 04/09/2019 1530     Risk Assessment/Calculations:   {Does this patient have ATRIAL FIBRILLATION?[phone]}       Physical Exam:    VS:  There were no vitals taken for this visit.    Wt Readings from Last 3 Encounters:  07/12/22 189 lb (85.7 kg)  12/02/21 208 lb 6 oz (94.5 kg)  08/11/21 215 lb (97.5 kg)     GEN: *** Well nourished, well developed in no acute distress HEENT: Normal NECK: No JVD; No carotid bruits CARDIAC: ***RRR, no murmurs, rubs, gallops RESPIRATORY:  Clear to auscultation without rales, wheezing or rhonchi  ABDOMEN: Soft, non-tender, non-distended MUSCULOSKELETAL:  No edema; No deformity. *** pedal pulses, ***bilaterally SKIN: Warm and dry NEUROLOGIC:  Alert and oriented x 3 PSYCHIATRIC:  Normal affect   EKG:  EKG is *** ordered today.  The ekg ordered today demonstrates ***  No BP recorded.  {Refresh Note OR Click here to enter BP  :1}***    Diagnoses:    1. Bilateral carotid artery stenosis    Assessment and Plan:     Junctional rhythm: Cardiac monitor 07/2021 revealed average heart rate normal, no significant episodes of bradycardia, rare skipped beats, rare short bursts of tachycardia.  Carotid artery disease: 1 to 39% bilateral carotid artery stenosis 07/2021. We will repeat when clinically appropriate   Hypertension:  {Are you ordering a CV Procedure (e.g. stress test, cath, DCCV, TEE, etc)?   Press F2        :[id number]}   Disposition:  Medication Adjustments/Labs and Tests Ordered: Current medicines are reviewed at length with the patient today.  Concerns regarding medicines are outlined above.  No orders of the defined types were placed in this encounter.  No orders of the defined types were placed in this encounter.   There are no Patient Instructions on file for this visit.   Signed, Emmaline Life, NP  08/02/2022  9:10 AM     [facility];i

## 2022-08-04 ENCOUNTER — Ambulatory Visit: Payer: Medicare HMO | Admitting: Nurse Practitioner

## 2022-08-04 DIAGNOSIS — I6523 Occlusion and stenosis of bilateral carotid arteries: Secondary | ICD-10-CM

## 2022-08-23 ENCOUNTER — Other Ambulatory Visit: Payer: Self-pay | Admitting: Internal Medicine

## 2022-08-24 NOTE — Telephone Encounter (Signed)
Pt's pharmacy is requesting a refill on pt's inhaler. Would Dr. Ross like to refill this medication? Please address 

## 2022-08-28 DIAGNOSIS — E782 Mixed hyperlipidemia: Secondary | ICD-10-CM | POA: Diagnosis not present

## 2022-08-28 DIAGNOSIS — R6889 Other general symptoms and signs: Secondary | ICD-10-CM | POA: Diagnosis not present

## 2022-08-28 DIAGNOSIS — R2681 Unsteadiness on feet: Secondary | ICD-10-CM | POA: Diagnosis not present

## 2022-08-28 DIAGNOSIS — I119 Hypertensive heart disease without heart failure: Secondary | ICD-10-CM | POA: Diagnosis not present

## 2022-08-28 DIAGNOSIS — M25551 Pain in right hip: Secondary | ICD-10-CM | POA: Diagnosis not present

## 2022-08-28 DIAGNOSIS — M2550 Pain in unspecified joint: Secondary | ICD-10-CM | POA: Diagnosis not present

## 2022-08-28 DIAGNOSIS — E1129 Type 2 diabetes mellitus with other diabetic kidney complication: Secondary | ICD-10-CM | POA: Diagnosis not present

## 2022-08-28 DIAGNOSIS — M62838 Other muscle spasm: Secondary | ICD-10-CM | POA: Diagnosis not present

## 2022-08-29 ENCOUNTER — Other Ambulatory Visit: Payer: Self-pay | Admitting: Internal Medicine

## 2022-08-30 NOTE — Telephone Encounter (Signed)
Pt's pharmacy is requesting a refill on pt's inhaler. Would Dr. Harrington Challenger like to refill this medication? Please address

## 2022-08-31 ENCOUNTER — Other Ambulatory Visit: Payer: Self-pay | Admitting: Internal Medicine

## 2022-09-06 DIAGNOSIS — R6889 Other general symptoms and signs: Secondary | ICD-10-CM | POA: Diagnosis not present

## 2022-09-07 DIAGNOSIS — B351 Tinea unguium: Secondary | ICD-10-CM | POA: Diagnosis not present

## 2022-09-07 DIAGNOSIS — R6889 Other general symptoms and signs: Secondary | ICD-10-CM | POA: Diagnosis not present

## 2022-09-07 DIAGNOSIS — E1151 Type 2 diabetes mellitus with diabetic peripheral angiopathy without gangrene: Secondary | ICD-10-CM | POA: Diagnosis not present

## 2022-09-07 DIAGNOSIS — I739 Peripheral vascular disease, unspecified: Secondary | ICD-10-CM | POA: Diagnosis not present

## 2022-09-07 DIAGNOSIS — R234 Changes in skin texture: Secondary | ICD-10-CM | POA: Diagnosis not present

## 2022-09-07 DIAGNOSIS — L609 Nail disorder, unspecified: Secondary | ICD-10-CM | POA: Diagnosis not present

## 2022-09-12 DIAGNOSIS — I10 Essential (primary) hypertension: Secondary | ICD-10-CM | POA: Diagnosis not present

## 2022-09-12 DIAGNOSIS — E782 Mixed hyperlipidemia: Secondary | ICD-10-CM | POA: Diagnosis not present

## 2022-09-14 DIAGNOSIS — L601 Onycholysis: Secondary | ICD-10-CM | POA: Diagnosis not present

## 2022-09-15 DIAGNOSIS — E119 Type 2 diabetes mellitus without complications: Secondary | ICD-10-CM | POA: Diagnosis not present

## 2022-09-15 DIAGNOSIS — H5213 Myopia, bilateral: Secondary | ICD-10-CM | POA: Diagnosis not present

## 2022-09-15 DIAGNOSIS — H25813 Combined forms of age-related cataract, bilateral: Secondary | ICD-10-CM | POA: Diagnosis not present

## 2022-09-15 DIAGNOSIS — H524 Presbyopia: Secondary | ICD-10-CM | POA: Diagnosis not present

## 2022-09-15 DIAGNOSIS — H4323 Crystalline deposits in vitreous body, bilateral: Secondary | ICD-10-CM | POA: Diagnosis not present

## 2022-09-15 DIAGNOSIS — H52203 Unspecified astigmatism, bilateral: Secondary | ICD-10-CM | POA: Diagnosis not present

## 2022-09-15 DIAGNOSIS — R6889 Other general symptoms and signs: Secondary | ICD-10-CM | POA: Diagnosis not present

## 2022-09-19 ENCOUNTER — Ambulatory Visit: Payer: Medicare HMO | Admitting: Pulmonary Disease

## 2022-09-19 ENCOUNTER — Other Ambulatory Visit: Payer: Self-pay | Admitting: Nurse Practitioner

## 2022-09-19 DIAGNOSIS — K760 Fatty (change of) liver, not elsewhere classified: Secondary | ICD-10-CM

## 2022-09-19 DIAGNOSIS — R6889 Other general symptoms and signs: Secondary | ICD-10-CM | POA: Diagnosis not present

## 2022-09-19 DIAGNOSIS — K7469 Other cirrhosis of liver: Secondary | ICD-10-CM

## 2022-09-21 DIAGNOSIS — R6889 Other general symptoms and signs: Secondary | ICD-10-CM | POA: Diagnosis not present

## 2022-09-25 ENCOUNTER — Other Ambulatory Visit: Payer: Medicare HMO

## 2022-09-26 ENCOUNTER — Other Ambulatory Visit: Payer: Medicare HMO

## 2022-09-26 ENCOUNTER — Other Ambulatory Visit: Payer: Self-pay | Admitting: Internal Medicine

## 2022-09-26 NOTE — Telephone Encounter (Signed)
Patient called to ask about refill for Advair. Advised patient this is not on her current med list. She states Dr. Tenny Craw gave her a courtesy refill, however usually gets from [facility]. Advised her to contact Pulm for possible refill as we typically do not manage asthma meds. Patient voiced understanding.

## 2022-09-28 ENCOUNTER — Ambulatory Visit: Payer: Medicare HMO | Admitting: Pulmonary Disease

## 2022-09-30 DIAGNOSIS — H524 Presbyopia: Secondary | ICD-10-CM | POA: Diagnosis not present

## 2022-10-03 ENCOUNTER — Ambulatory Visit: Payer: Medicare HMO | Admitting: Pulmonary Disease

## 2022-10-03 ENCOUNTER — Telehealth: Payer: Self-pay | Admitting: Pulmonary Disease

## 2022-10-03 MED ORDER — FLUTICASONE-SALMETEROL 500-50 MCG/ACT IN AEPB: INHALATION_SPRAY | RESPIRATORY_TRACT | 0 refills | Status: DC

## 2022-10-03 NOTE — Telephone Encounter (Signed)
Called and left voicemail for patient that I can send in one inhaler for her to her pharmacy but she is going to call back for office visit. She has seen Dr Francine Graven 07/2021. Patient needs follow up for more refills. Nothing further needed

## 2022-10-03 NOTE — Telephone Encounter (Signed)
Patient called to request a refill for her Advair.  Please send to [facility].  Any questions, please call at [phone]

## 2022-10-04 ENCOUNTER — Other Ambulatory Visit: Payer: Medicare HMO

## 2022-10-11 DIAGNOSIS — R6889 Other general symptoms and signs: Secondary | ICD-10-CM | POA: Diagnosis not present

## 2022-10-12 DIAGNOSIS — E782 Mixed hyperlipidemia: Secondary | ICD-10-CM | POA: Diagnosis not present

## 2022-10-12 DIAGNOSIS — I10 Essential (primary) hypertension: Secondary | ICD-10-CM | POA: Diagnosis not present

## 2022-10-17 ENCOUNTER — Other Ambulatory Visit: Payer: Self-pay | Admitting: Internal Medicine

## 2022-10-17 DIAGNOSIS — Z1231 Encounter for screening mammogram for malignant neoplasm of breast: Secondary | ICD-10-CM

## 2022-10-18 ENCOUNTER — Ambulatory Visit (INDEPENDENT_AMBULATORY_CARE_PROVIDER_SITE_OTHER): Payer: Medicare HMO | Admitting: Pulmonary Disease

## 2022-10-18 ENCOUNTER — Encounter: Payer: Self-pay | Admitting: Pulmonary Disease

## 2022-10-18 VITALS — BP 130/78 | HR 71 | Ht 61.0 in | Wt 197.6 lb

## 2022-10-18 DIAGNOSIS — J4550 Severe persistent asthma, uncomplicated: Secondary | ICD-10-CM | POA: Diagnosis not present

## 2022-10-18 DIAGNOSIS — R6889 Other general symptoms and signs: Secondary | ICD-10-CM | POA: Diagnosis not present

## 2022-10-18 MED ORDER — ALBUTEROL SULFATE HFA 108 (90 BASE) MCG/ACT IN AERS: INHALATION_SPRAY | RESPIRATORY_TRACT | 4 refills | Status: DC

## 2022-10-18 NOTE — Progress Notes (Signed)
Synopsis: Returns to clinic to re-establish care  Subjective:   PATIENT ID: Anita Gonzalez GENDER: female DOB: [birth year], MRN: [id number]  HPI  Chief Complaint  Patient presents with   Follow-up   Anita Gonzalez is a 76 year old woman, former smoker with chronic hypoxemic respiratory failure on 2L O2 with ambulation, asthma and obstructive sleep apnea who returns to pulmonary clinic for follow up.    She is using advair 250-109mcg 1 puff twice daily and spiriva 2.46mcg 2 puffs daily. She does not have much of need for albuterol rescue inhaler or nebulizer treatment.   She is requesting small oxygen tank. The POC is too heavy for her to carry.   OV 08/11/21 She is currently using advair diskus instead of trelegy as her insurance company would not cover Trelegy. She has ongoing exertional dyspnea and coughing with exertion.  She was diagnosed with sleep apnea in the past and has not been on CPAP as she did not tolerate it previously.   PFTs from 2017 show FEV1/FVC 89, FEV1 1.03L (63%), FVC 1.16L (55%), TLC 2.84 (61%), DLCO 68% consistent with a mild restrictive defect and mild diffusion defect.   HR CT Chest in 2017 showed scattered areas of mild cylindrical bronchiectasis and associated thickening of the peribronchovascular interstitium. Inspiratory and expiratory imaging demonstrates air trapping. There is severe collapse of the trachea and mild collapse of the mainstem bronchi on expiratory phase indicative of tracheobronchomalacia.    Past Medical History:  Diagnosis Date   Acute kidney injury (HCC) 01/11/2015   Alcohol abuse, in remission    Anemia    Anemia, iron deficiency 01/28/2015   Arthritis    Asthma    Bronchiectasis (HCC) 03/07/2016   Bronchitis    COPD (chronic obstructive pulmonary disease) (HCC)    Diabetes mellitus without complication (HCC)    type 2   DM II (diabetes mellitus, type II), controlled (HCC) 01/06/2015   Dyspnea and respiratory abnormality 04/06/2015    Dysrhythmia    hx of palpitations   E-coli UTI 01/11/2015   Essential hypertension 01/06/2015   GERD (gastroesophageal reflux disease)    Hay fever    Hepatitis    c treated with pills   HLD (hyperlipidemia) 01/06/2015   Hypertension    IBS (irritable bowel syndrome)    ILD (interstitial lung disease) (HCC) 12/11/2016   Leg cramps, sleep related 12/01/2015   Pneumonia    Rheumatoid arthritis (HCC) 1996   Sinus tachycardia 01/11/2015   LT monitor 01/2019: normal sinus rhythm, occ short bursts of PAT, accelerated junctional rhythm, no sig arrhythmias.      Family History  Problem Relation Age of Onset   Diabetes Mother    Heart failure Mother    Throat cancer Brother        throat cancer    Asthma Other    COPD Other    Hypertension Other    Stroke Other    Heart disease Other      Social History   Socioeconomic History   Marital status: Single    Spouse name: Not on file   Number of children: Not on file   Years of education: Not on file   Highest education level: Not on file  Occupational History   Occupation: retired  Tobacco Use   Smoking status: Former    Packs/day: 0.75    Years: 25.00    Total pack years: 18.75    Types: Cigarettes    Quit date: 01/12/2011  Years since quitting: 11.7   Smokeless tobacco: Never  Vaping Use   Vaping Use: Never used  Substance and Sexual Activity   Alcohol use: Not Currently    Comment: 2 years ago (2021)   Drug use: No   Sexual activity: Not Currently  Other Topics Concern   Not on file  Social History Narrative   Not on file   Social Determinants of Health   Financial Resource Strain: Not on file  Food Insecurity: Not on file  Transportation Needs: Not on file  Physical Activity: Not on file  Stress: Not on file  Social Connections: Not on file  Intimate Partner Violence: Not on file     Allergies  Allergen Reactions   Fish Allergy Anaphylaxis, Hives, Swelling and Other (See Comments)   Peanuts [Peanut Oil]  Anaphylaxis, Hives, Itching and Swelling   Penicillins Hives, Itching, Swelling and Other (See Comments)    PATIENT HAS HAD A PCN REACTION WITH IMMEDIATE RASH, FACIAL/TONGUE/THROAT SWELLING, SOB, OR LIGHTHEADEDNESS WITH HYPOTENSION:  #  #  YES  #  #  HAS PT DEVELOPED SEVERE RASH INVOLVING MUCUS MEMBRANES or SKIN NECROSIS: #  #  YES  #  # PATIENT HAS HAD A PCN REACTION THAT REQUIRED HOSPITALIZATION:  #  #  YES  #  #  Has patient had a PCN reaction occurring within the last 10 years: No    Iodinated Contrast Media Hives, Itching and Swelling    SWELLING REACTION UNSPECIFIED    Iodine Hives, Itching and Swelling    Patient reports receiving IV dye in abdominal CT scan without issue   Percocet [Oxycodone-Acetaminophen] Nausea Only, Anxiety and Other (See Comments)    Raised blood pressure, Sweats, nervous,dizziness   2,4-D Dimethylamine Other (See Comments)   Peanut (Diagnostic) Other (See Comments)   Peanut Allergen Powder-Dnfp    Hydrocodone Itching and Nausea Only   Other Other (See Comments)    UNSPECIFIED REACTION  Hypersensitive to perfumes, colognes, powders, lotions, room sprays   Oxycodone Itching, Nausea Only and Other (See Comments)    Sweats and dizziness   Sulfa Antibiotics     UNSPECIFIED REACTION      Outpatient Medications Prior to Visit  Medication Sig Dispense Refill   albuterol (PROVENTIL) (2.5 MG/3ML) 0.083% nebulizer solution USE 1 UNIT DOSE(VIAL)VIA NEBULIZER 4 TIMES A DAY AS NEEDED AS DIRECTED 1080 mL 3   amLODipine (NORVASC) 5 MG tablet TAKE 1 TABLET BY MOUTH IN THE MORNING AND AT BEDTIME 180 tablet 1   aspirin EC 81 MG tablet Take 1 tablet (81 mg total) by mouth daily. 90 tablet 3   atorvastatin (LIPITOR) 40 MG tablet ONE TABLET BY MOUTH DAILY 90 tablet 0   bumetanide (BUMEX) 2 MG tablet TAKE 1 TABLET BY MOUTH 2 TIMES A WEEK 24 tablet 3   cyanocobalamin 1000 MCG tablet Take 1,000 mcg by mouth every Thursday.      cyclobenzaprine (FLEXERIL) 10 MG tablet TAKE ONE  TABLET BY MOUTH AT BEDTIME 30 tablet 10   diclofenac Sodium (VOLTAREN) 1 % GEL APPLY 2 G TOPICALLY 4 TIMES A DAY 700 g 2   diphenoxylate-atropine (LOMOTIL) 2.5-0.025 MG tablet Take 1 tablet by mouth 4 (four) times daily as needed for diarrhea or loose stools.      docusate sodium (COLACE) 100 MG capsule Take 100 mg by mouth 2 (two) times daily as needed for mild constipation.     ferrous sulfate 325 (65 FE) MG tablet Take 325 mg by mouth  2 (two) times daily with a meal.      fluticasone (FLONASE) 50 MCG/ACT nasal spray Place 1 spray into both nostrils daily as needed for allergies or rhinitis.     fluticasone-salmeterol (ADVAIR DISKUS) 500-50 MCG/ACT AEPB INHALE 1 PUFF BY MOUTH 2 TIMES A DAY 180 each 0   folic acid (FOLVITE) 1 MG tablet ONE TABLET BY MOUTH DAILY 90 tablet 0   gabapentin (NEURONTIN) 600 MG tablet Take 600 mg by mouth 3 (three) times daily.     losartan (COZAAR) 50 MG tablet TAKE 1 TABLET BY MOUTH DAILY 90 tablet 3   metFORMIN (GLUCOPHAGE) 500 MG tablet Take 500 mg by mouth 2 (two) times daily.     metFORMIN (GLUCOPHAGE-XR) 500 MG 24 hr tablet Take 500 mg by mouth 2 (two) times daily.     methocarbamol (ROBAXIN) 500 MG tablet Take 1 tablet (500 mg total) by mouth every 6 (six) hours as needed for muscle spasms. 40 tablet 0   montelukast (SINGULAIR) 10 MG tablet TAKE 1 TABLET BY MOUTH DAILY 90 tablet 3   naproxen (NAPROSYN) 375 MG tablet Take 1 tablet (375 mg total) by mouth 2 (two) times daily. 20 tablet 0   pantoprazole (PROTONIX) 40 MG tablet ONE TABLET BY MOUTH DAILY 90 tablet 2   SPIRIVA RESPIMAT 2.5 MCG/ACT AERS INHALE 2 PUFFS INTO THE LUNGS DAILY 12 g 0   traZODone (DESYREL) 25 mg TABS tablet Take 25 mg by mouth at bedtime.     Vitamin D, Ergocalciferol, (DRISDOL) 1.25 MG (50000 UNIT) CAPS capsule TAKE 1 CAPSULE BY MOUTH WEEKLY 12 capsule 2   albuterol (VENTOLIN HFA) 108 (90 Base) MCG/ACT inhaler INHALE 2 PUFFS BY MOUTH EVERY 6 HRS AS NEEDED 54 g 4   ezetimibe (ZETIA) 10 MG  tablet ONE TABLET BY MOUTH DAILY (Patient not taking: Reported on 10/18/2022) 90 tablet 2   gabapentin (NEURONTIN) 100 MG capsule Take 100 mg by mouth 3 (three) times daily.     predniSONE (DELTASONE) 50 MG tablet Take 1 tablet (50 mg total) by mouth daily. (Patient not taking: Reported on 10/18/2022) 5 tablet 0   pregabalin (LYRICA) 100 MG capsule  (Patient not taking: Reported on 10/18/2022)     pregabalin (LYRICA) 150 MG capsule Take 150 mg by mouth 2 (two) times daily. (Patient not taking: Reported on 10/18/2022)     traMADol (ULTRAM) 50 MG tablet Take 1 tablet (50 mg total) by mouth every 6 (six) hours as needed. (Patient not taking: Reported on 10/18/2022) 15 tablet 0   No facility-administered medications prior to visit.    Review of Systems  Constitutional:  Negative for chills, fever, malaise/fatigue and weight loss.  HENT:  Negative for congestion, sinus pain and sore throat.   Eyes: Negative.   Respiratory:  Positive for cough, sputum production and shortness of breath. Negative for hemoptysis and wheezing.   Cardiovascular:  Negative for chest pain, palpitations, orthopnea, claudication, leg swelling and PND.  Gastrointestinal:  Negative for abdominal pain, heartburn, nausea and vomiting.  Genitourinary: Negative.   Musculoskeletal: Negative.   Neurological:  Negative for dizziness, weakness and headaches.  Psychiatric/Behavioral: Negative.      Objective:   Vitals:   10/18/22 1525  BP: 130/78  Pulse: 71  SpO2: 98%  Weight: 197 lb 9.6 oz (89.6 kg)  Height: 5\' 1"  (1.549 m)    Physical Exam Constitutional:      General: She is not in acute distress.    Appearance: She is obese.  HENT:     Head: Normocephalic and atraumatic.     Nose: Nose normal.     Mouth/Throat:     Mouth: Mucous membranes are moist.     Pharynx: Oropharynx is clear.  Eyes:     General: No scleral icterus.    Conjunctiva/sclera: Conjunctivae normal.     Pupils: Pupils are equal, round, and  reactive to light.  Cardiovascular:     Rate and Rhythm: Normal rate and regular rhythm.     Pulses: Normal pulses.     Heart sounds: Normal heart sounds. No murmur heard. Pulmonary:     Effort: Pulmonary effort is normal.     Breath sounds: No wheezing, rhonchi or rales.  Musculoskeletal:     Cervical back: Neck supple.     Right lower leg: No edema.     Left lower leg: No edema.  Skin:    General: Skin is warm and dry.  Neurological:     General: No focal deficit present.     Mental Status: She is alert.     CBC    Component Value Date/Time   WBC 5.5 12/02/2021 1411   RBC 3.49 (L) 12/02/2021 1411   HGB 10.2 (L) 12/02/2021 1411   HGB 10.2 (L) 02/04/2021 1211   HGB 10.9 (L) 04/09/2019 1530   HGB 11.9 07/24/2017 1334   HCT 34.3 (L) 12/02/2021 1411   HCT 32.4 (L) 04/09/2019 1530   HCT 37.1 07/24/2017 1334   PLT 281 12/02/2021 1411   PLT 258 02/04/2021 1211   PLT 281 04/09/2019 1530   MCV 98.3 12/02/2021 1411   MCV 93 04/09/2019 1530   MCV 96.7 07/24/2017 1334   MCH 29.2 12/02/2021 1411   MCHC 29.7 (L) 12/02/2021 1411   RDW 15.5 12/02/2021 1411   RDW 13.3 04/09/2019 1530   RDW 14.6 (H) 07/24/2017 1334   LYMPHSABS 1.5 12/02/2021 1411   LYMPHSABS 1.8 07/24/2017 1334   MONOABS 0.4 12/02/2021 1411   MONOABS 0.4 07/24/2017 1334   EOSABS 0.2 12/02/2021 1411   EOSABS 0.2 07/24/2017 1334   BASOSABS 0.0 12/02/2021 1411   BASOSABS 0.0 07/24/2017 1334      Latest Ref Rng & Units 12/02/2021    2:11 PM 08/04/2021   12:24 PM 02/04/2021   12:11 PM  BMP  Glucose 70 - 99 mg/dL 299  371  696   BUN 8 - 23 mg/dL 12  16  9    Creatinine 0.44 - 1.00 mg/dL 7.89  3.81  0.17   Sodium 135 - 145 mmol/L 143  144  145   Potassium 3.5 - 5.1 mmol/L 3.9  4.0  3.7   Chloride 98 - 111 mmol/L 105  102  103   CO2 22 - 32 mmol/L 31  30  31    Calcium 8.9 - 10.3 mg/dL 9.7  9.5  9.4    Chest imaging: CXR 09/02/2018 There is shallow lung inflation. The cardiomediastinal contours are normal.    There is no focal airspace consolidation or pulmonary edema. There is no pleural effusion or pneumothorax.  HRCT Chest 06/08/2016 1. Again noted is mild diffuse cylindrical bronchiectasis and thickening of the peribronchovascular interstitium. The subtle areas of ground-glass attenuation and septal thickening in the lungs persist compared to the prior study, but appear unchanged, and may indicate areas of nonspecific interstitial pneumonia (NSIP). 2. Today's study also demonstrates mild air trapping, indicative of small airways disease, and demonstrates evidence of tracheobronchomalacia. In retrospect, the prior expiratory phase images  were unlikely to be truly expiratory on study 04/13/2015. 3. Aortic atherosclerosis, in addition to three-vessel coronary artery disease. Please note that although the presence of coronary artery calcium documents the presence of coronary artery disease, the severity of this disease and any potential stenosis cannot be assessed on this non-gated CT examination. Assessment for potential risk factor modification, dietary therapy or pharmacologic therapy may be warranted, if clinically indicated. 4. Cardiomegaly with mild left atrial dilatation.  V/Q Scan 05/30/2015 No ventilation or perfusion defects identified. Very low probability of pulmonary embolus.  PFT:    Latest Ref Rng & Units 05/30/2016   11:50 AM 04/16/2015   12:22 PM  PFT Results  FVC-Pre L 1.16  1.18   FVC-Predicted Pre % 55  55   FVC-Post L 1.16  1.37   FVC-Predicted Post % 55  64   Pre FEV1/FVC % % 88  88   Post FEV1/FCV % % 89  88   FEV1-Pre L 1.02  1.04   FEV1-Predicted Pre % 63  62   FEV1-Post L 1.03  1.20   DLCO uncorrected ml/min/mmHg 13.80  7.13   DLCO UNC% % 68  35   DLCO corrected ml/min/mmHg 14.85  7.91   DLCO COR %Predicted % 73  39   DLVA Predicted % 147  85   TLC L 2.84  2.72   TLC % Predicted % 61  59   RV % Predicted % 74  71    Echo: 04/13/2015 - Left  ventricle: The cavity size was normal. Wall thickness was    normal. Systolic function was normal. The estimated ejection    fraction was in the range of 60% to 65%. Wall motion was normal;    there were no regional wall motion abnormalities. Doppler    parameters are consistent with abnormal left ventricular    relaxation (grade 1 diastolic dysfunction).  - Left atrium: The atrium was mildly dilated.   Stress Test 01/31/2017 1. EF 66%, normal wall motion.  2. Fixed, small, mild mid anterior perfusion defect.  Given normal wall motion, suspect soft tissue attenuation rather than infarction.  No ischemia.  3. Low risk study.    Assessment & Plan:   Severe persistent asthma without complication - Plan: albuterol (VENTOLIN HFA) 108 (90 Base) MCG/ACT inhaler  Discussion: Isidora Laham is a 76 year old woman, former smoker with chronic hypoxemic respiratory failure on 2L O2 with ambulation, asthma, bronchiectasis, tracheobronchomalacia and obstructive sleep apnea who returns to pulmonary clinic for follow up.     She is to continue advair diskus 1 puff twice daily and spiriva 2 puffs daily. She is to use albuterol as needed.  Follow up in 1 year.  Melody Comas, MD [facility] Office: [phone]    Current Outpatient Medications:    albuterol (PROVENTIL) (2.5 MG/3ML) 0.083% nebulizer solution, USE 1 UNIT DOSE(VIAL)VIA NEBULIZER 4 TIMES A DAY AS NEEDED AS DIRECTED, Disp: 1080 mL, Rfl: 3   amLODipine (NORVASC) 5 MG tablet, TAKE 1 TABLET BY MOUTH IN THE MORNING AND AT BEDTIME, Disp: 180 tablet, Rfl: 1   aspirin EC 81 MG tablet, Take 1 tablet (81 mg total) by mouth daily., Disp: 90 tablet, Rfl: 3   atorvastatin (LIPITOR) 40 MG tablet, ONE TABLET BY MOUTH DAILY, Disp: 90 tablet, Rfl: 0   bumetanide (BUMEX) 2 MG tablet, TAKE 1 TABLET BY MOUTH 2 TIMES A WEEK, Disp: 24 tablet, Rfl: 3   cyanocobalamin 1000 MCG tablet, Take 1,000  mcg by mouth every Thursday. , Disp: , Rfl:     cyclobenzaprine (FLEXERIL) 10 MG tablet, TAKE ONE TABLET BY MOUTH AT BEDTIME, Disp: 30 tablet, Rfl: 10   diclofenac Sodium (VOLTAREN) 1 % GEL, APPLY 2 G TOPICALLY 4 TIMES A DAY, Disp: 700 g, Rfl: 2   diphenoxylate-atropine (LOMOTIL) 2.5-0.025 MG tablet, Take 1 tablet by mouth 4 (four) times daily as needed for diarrhea or loose stools. , Disp: , Rfl:    docusate sodium (COLACE) 100 MG capsule, Take 100 mg by mouth 2 (two) times daily as needed for mild constipation., Disp: , Rfl:    ferrous sulfate 325 (65 FE) MG tablet, Take 325 mg by mouth 2 (two) times daily with a meal. , Disp: , Rfl:    fluticasone (FLONASE) 50 MCG/ACT nasal spray, Place 1 spray into both nostrils daily as needed for allergies or rhinitis., Disp: , Rfl:    fluticasone-salmeterol (ADVAIR DISKUS) 500-50 MCG/ACT AEPB, INHALE 1 PUFF BY MOUTH 2 TIMES A DAY, Disp: 180 each, Rfl: 0   folic acid (FOLVITE) 1 MG tablet, ONE TABLET BY MOUTH DAILY, Disp: 90 tablet, Rfl: 0   gabapentin (NEURONTIN) 600 MG tablet, Take 600 mg by mouth 3 (three) times daily., Disp: , Rfl:    losartan (COZAAR) 50 MG tablet, TAKE 1 TABLET BY MOUTH DAILY, Disp: 90 tablet, Rfl: 3   metFORMIN (GLUCOPHAGE) 500 MG tablet, Take 500 mg by mouth 2 (two) times daily., Disp: , Rfl:    metFORMIN (GLUCOPHAGE-XR) 500 MG 24 hr tablet, Take 500 mg by mouth 2 (two) times daily., Disp: , Rfl:    methocarbamol (ROBAXIN) 500 MG tablet, Take 1 tablet (500 mg total) by mouth every 6 (six) hours as needed for muscle spasms., Disp: 40 tablet, Rfl: 0   montelukast (SINGULAIR) 10 MG tablet, TAKE 1 TABLET BY MOUTH DAILY, Disp: 90 tablet, Rfl: 3   naproxen (NAPROSYN) 375 MG tablet, Take 1 tablet (375 mg total) by mouth 2 (two) times daily., Disp: 20 tablet, Rfl: 0   pantoprazole (PROTONIX) 40 MG tablet, ONE TABLET BY MOUTH DAILY, Disp: 90 tablet, Rfl: 2   SPIRIVA RESPIMAT 2.5 MCG/ACT AERS, INHALE 2 PUFFS INTO THE LUNGS DAILY, Disp: 12 g, Rfl: 0   traZODone (DESYREL) 25 mg TABS tablet,  Take 25 mg by mouth at bedtime., Disp: , Rfl:    Vitamin D, Ergocalciferol, (DRISDOL) 1.25 MG (50000 UNIT) CAPS capsule, TAKE 1 CAPSULE BY MOUTH WEEKLY, Disp: 12 capsule, Rfl: 2   albuterol (VENTOLIN HFA) 108 (90 Base) MCG/ACT inhaler, INHALE 2 PUFFS BY MOUTH EVERY 6 HRS AS NEEDED, Disp: 54 g, Rfl: 4

## 2022-10-18 NOTE — Patient Instructions (Signed)
Continue advair diskus 1 puff twice daily   Continue spiriva respimat inhaler 2 puffs daily   Continue to use albuterol as needed.   Follow up in 1 year

## 2022-10-19 ENCOUNTER — Ambulatory Visit
Admission: RE | Admit: 2022-10-19 | Discharge: 2022-10-19 | Disposition: A | Discharge status: Home / Self Care | Payer: Medicare HMO | Source: Ambulatory Visit | Attending: Nurse Practitioner | Admitting: Nurse Practitioner

## 2022-10-19 DIAGNOSIS — R6889 Other general symptoms and signs: Secondary | ICD-10-CM | POA: Diagnosis not present

## 2022-10-19 DIAGNOSIS — K760 Fatty (change of) liver, not elsewhere classified: Secondary | ICD-10-CM

## 2022-10-19 DIAGNOSIS — K7460 Unspecified cirrhosis of liver: Secondary | ICD-10-CM | POA: Diagnosis not present

## 2022-10-19 DIAGNOSIS — K7469 Other cirrhosis of liver: Secondary | ICD-10-CM

## 2022-10-31 DIAGNOSIS — R6889 Other general symptoms and signs: Secondary | ICD-10-CM | POA: Diagnosis not present

## 2022-11-12 DIAGNOSIS — E782 Mixed hyperlipidemia: Secondary | ICD-10-CM | POA: Diagnosis not present

## 2022-11-12 DIAGNOSIS — I10 Essential (primary) hypertension: Secondary | ICD-10-CM | POA: Diagnosis not present

## 2022-11-21 ENCOUNTER — Encounter: Payer: Self-pay | Admitting: Pulmonary Disease

## 2022-11-21 ENCOUNTER — Other Ambulatory Visit: Payer: Self-pay | Admitting: Hematology

## 2022-11-23 ENCOUNTER — Other Ambulatory Visit: Payer: Self-pay | Admitting: Hematology

## 2022-11-28 ENCOUNTER — Other Ambulatory Visit: Payer: Self-pay | Admitting: Hematology

## 2022-12-05 DIAGNOSIS — R6889 Other general symptoms and signs: Secondary | ICD-10-CM | POA: Diagnosis not present

## 2022-12-07 DIAGNOSIS — R6889 Other general symptoms and signs: Secondary | ICD-10-CM | POA: Diagnosis not present

## 2022-12-07 DIAGNOSIS — M170 Bilateral primary osteoarthritis of knee: Secondary | ICD-10-CM | POA: Diagnosis not present

## 2022-12-07 DIAGNOSIS — M25552 Pain in left hip: Secondary | ICD-10-CM | POA: Diagnosis not present

## 2022-12-07 DIAGNOSIS — M1711 Unilateral primary osteoarthritis, right knee: Secondary | ICD-10-CM | POA: Diagnosis not present

## 2022-12-07 DIAGNOSIS — M1712 Unilateral primary osteoarthritis, left knee: Secondary | ICD-10-CM | POA: Diagnosis not present

## 2022-12-07 DIAGNOSIS — M25551 Pain in right hip: Secondary | ICD-10-CM | POA: Diagnosis not present

## 2022-12-08 DIAGNOSIS — M0609 Rheumatoid arthritis without rheumatoid factor, multiple sites: Secondary | ICD-10-CM | POA: Diagnosis not present

## 2022-12-08 DIAGNOSIS — M1389 Other specified arthritis, multiple sites: Secondary | ICD-10-CM | POA: Diagnosis not present

## 2022-12-08 DIAGNOSIS — R269 Unspecified abnormalities of gait and mobility: Secondary | ICD-10-CM | POA: Diagnosis not present

## 2022-12-08 DIAGNOSIS — M109 Gout, unspecified: Secondary | ICD-10-CM | POA: Diagnosis not present

## 2022-12-12 DIAGNOSIS — Z0100 Encounter for examination of eyes and vision without abnormal findings: Secondary | ICD-10-CM | POA: Diagnosis not present

## 2022-12-12 DIAGNOSIS — R6889 Other general symptoms and signs: Secondary | ICD-10-CM | POA: Diagnosis not present

## 2022-12-12 DIAGNOSIS — E119 Type 2 diabetes mellitus without complications: Secondary | ICD-10-CM | POA: Diagnosis not present

## 2022-12-13 ENCOUNTER — Ambulatory Visit
Admission: RE | Admit: 2022-12-13 | Discharge: 2022-12-13 | Disposition: A | Discharge status: Home / Self Care | Payer: Medicare HMO | Source: Ambulatory Visit | Attending: Internal Medicine | Admitting: Internal Medicine

## 2022-12-13 DIAGNOSIS — Z1231 Encounter for screening mammogram for malignant neoplasm of breast: Secondary | ICD-10-CM

## 2022-12-13 DIAGNOSIS — R6889 Other general symptoms and signs: Secondary | ICD-10-CM | POA: Diagnosis not present

## 2022-12-19 ENCOUNTER — Other Ambulatory Visit: Payer: Self-pay | Admitting: Pulmonary Disease

## 2022-12-20 DIAGNOSIS — M25551 Pain in right hip: Secondary | ICD-10-CM | POA: Diagnosis not present

## 2022-12-20 DIAGNOSIS — R6889 Other general symptoms and signs: Secondary | ICD-10-CM | POA: Diagnosis not present

## 2022-12-20 DIAGNOSIS — M7061 Trochanteric bursitis, right hip: Secondary | ICD-10-CM | POA: Diagnosis not present

## 2022-12-25 DIAGNOSIS — R6889 Other general symptoms and signs: Secondary | ICD-10-CM | POA: Diagnosis not present

## 2022-12-25 DIAGNOSIS — I119 Hypertensive heart disease without heart failure: Secondary | ICD-10-CM | POA: Diagnosis not present

## 2022-12-25 DIAGNOSIS — J449 Chronic obstructive pulmonary disease, unspecified: Secondary | ICD-10-CM | POA: Diagnosis not present

## 2022-12-25 DIAGNOSIS — E782 Mixed hyperlipidemia: Secondary | ICD-10-CM | POA: Diagnosis not present

## 2022-12-25 DIAGNOSIS — R2681 Unsteadiness on feet: Secondary | ICD-10-CM | POA: Diagnosis not present

## 2022-12-25 DIAGNOSIS — M62838 Other muscle spasm: Secondary | ICD-10-CM | POA: Diagnosis not present

## 2022-12-25 DIAGNOSIS — M2550 Pain in unspecified joint: Secondary | ICD-10-CM | POA: Diagnosis not present

## 2022-12-25 DIAGNOSIS — E1129 Type 2 diabetes mellitus with other diabetic kidney complication: Secondary | ICD-10-CM | POA: Diagnosis not present

## 2022-12-25 DIAGNOSIS — M25551 Pain in right hip: Secondary | ICD-10-CM | POA: Diagnosis not present

## 2023-01-05 DIAGNOSIS — R6889 Other general symptoms and signs: Secondary | ICD-10-CM | POA: Diagnosis not present

## 2023-01-10 DIAGNOSIS — R6889 Other general symptoms and signs: Secondary | ICD-10-CM | POA: Diagnosis not present

## 2023-01-10 DIAGNOSIS — H25812 Combined forms of age-related cataract, left eye: Secondary | ICD-10-CM | POA: Diagnosis not present

## 2023-01-11 ENCOUNTER — Other Ambulatory Visit: Payer: Self-pay | Admitting: Internal Medicine

## 2023-02-01 ENCOUNTER — Encounter (HOSPITAL_BASED_OUTPATIENT_CLINIC_OR_DEPARTMENT_OTHER): Payer: Self-pay

## 2023-02-01 ENCOUNTER — Other Ambulatory Visit: Payer: Self-pay

## 2023-02-01 ENCOUNTER — Emergency Department (HOSPITAL_BASED_OUTPATIENT_CLINIC_OR_DEPARTMENT_OTHER): Payer: Medicare HMO

## 2023-02-01 ENCOUNTER — Emergency Department (HOSPITAL_BASED_OUTPATIENT_CLINIC_OR_DEPARTMENT_OTHER)
Admission: EM | Admit: 2023-02-01 | Discharge: 2023-02-02 | Disposition: A | Discharge status: Home / Self Care | Payer: Medicare HMO | Attending: Emergency Medicine | Admitting: Emergency Medicine

## 2023-02-01 ENCOUNTER — Emergency Department (HOSPITAL_BASED_OUTPATIENT_CLINIC_OR_DEPARTMENT_OTHER): Payer: Medicare HMO | Admitting: Radiology

## 2023-02-01 DIAGNOSIS — R609 Edema, unspecified: Secondary | ICD-10-CM | POA: Diagnosis not present

## 2023-02-01 DIAGNOSIS — M25562 Pain in left knee: Secondary | ICD-10-CM | POA: Insufficient documentation

## 2023-02-01 DIAGNOSIS — R102 Pelvic and perineal pain: Secondary | ICD-10-CM | POA: Diagnosis not present

## 2023-02-01 DIAGNOSIS — J45909 Unspecified asthma, uncomplicated: Secondary | ICD-10-CM | POA: Insufficient documentation

## 2023-02-01 DIAGNOSIS — M25551 Pain in right hip: Secondary | ICD-10-CM | POA: Insufficient documentation

## 2023-02-01 DIAGNOSIS — E119 Type 2 diabetes mellitus without complications: Secondary | ICD-10-CM | POA: Diagnosis not present

## 2023-02-01 DIAGNOSIS — W19XXXA Unspecified fall, initial encounter: Secondary | ICD-10-CM | POA: Insufficient documentation

## 2023-02-01 DIAGNOSIS — Z87891 Personal history of nicotine dependence: Secondary | ICD-10-CM | POA: Insufficient documentation

## 2023-02-01 DIAGNOSIS — S80919A Unspecified superficial injury of unspecified knee, initial encounter: Secondary | ICD-10-CM | POA: Diagnosis not present

## 2023-02-01 DIAGNOSIS — M25561 Pain in right knee: Secondary | ICD-10-CM | POA: Insufficient documentation

## 2023-02-01 DIAGNOSIS — J449 Chronic obstructive pulmonary disease, unspecified: Secondary | ICD-10-CM | POA: Diagnosis not present

## 2023-02-01 DIAGNOSIS — I10 Essential (primary) hypertension: Secondary | ICD-10-CM | POA: Insufficient documentation

## 2023-02-01 NOTE — ED Triage Notes (Addendum)
Patient BIB [facility] from Home.  Endorses Right Knee Pain for 2 Days. Had a Fall today at 0600 where she was reaching for her [place]ile and her Knees gave way falling onto both. Now both are painful.   No head injury. No LOC. VSS with EMS en Route. No EMS Interventions. Uses 2LNC chronically.   NAD Noted during Triage. A&Ox4. GCS 15. BIB Wheelchair/Stretcher.

## 2023-02-02 DIAGNOSIS — M25561 Pain in right knee: Secondary | ICD-10-CM | POA: Diagnosis not present

## 2023-02-02 MED ORDER — TRAMADOL HCL 50 MG PO TABS
50.0000 mg | ORAL_TABLET | Freq: Once | ORAL | Status: AC
  Administered 2023-02-02: 50 mg via ORAL
  Filled 2023-02-02: qty 1

## 2023-02-02 MED ORDER — ACETAMINOPHEN 500 MG PO TABS
1000.0000 mg | ORAL_TABLET | Freq: Once | ORAL | Status: AC
  Administered 2023-02-02: 1000 mg via ORAL
  Filled 2023-02-02: qty 2

## 2023-02-02 NOTE — Discharge Instructions (Signed)
You were evaluated in the Emergency Department and after careful evaluation, we did not find any emergent condition requiring admission or further testing in the hospital.  Your exam/testing today is overall reassuring.  Recommend using Tylenol and your tramadol at home for discomfort and following up with your primary care doctor to discuss your arthritis.  Please return to the Emergency Department if you experience any worsening of your condition.   Thank you for allowing [place] to be a part of your care.

## 2023-02-02 NOTE — ED Notes (Signed)
RN reviewed discharge instructions with pt. Pt verbalized understanding and had no further questions. VSS upon discharge.  

## 2023-02-02 NOTE — ED Provider Notes (Signed)
[facility] Emergency Department Provider Note MRN:  [id number]  Arrival date & time: 02/02/23     Chief Complaint   Fall   History of Present Illness   Anita Gonzalez is a 77 y.o. year-old female with a history of diabetes, COPD presenting to the ED with chief complaint of fall.  Stood up reaching for the walker and fell onto her knees.  Pain to both knees as well as the right hip.  Worse with motion or palpation.  No head trauma, no loss consciousness, no neck or back pain, no chest pain or abdominal pain.  Review of Systems  A thorough review of systems was obtained and all systems are negative except as noted in the HPI and PMH.   Patient's Health History    Past Medical History:  Diagnosis Date   Acute kidney injury ([place]) 01/11/2015   Alcohol abuse, in remission    Anemia    Anemia, iron deficiency 01/28/2015   Arthritis    Asthma    Bronchiectasis ([place]) 03/07/2016   Bronchitis    COPD (chronic obstructive pulmonary disease) (HCC)    Diabetes mellitus without complication ([place])    type 2   DM II (diabetes mellitus, type II), controlled ([place]) 01/06/2015   Dyspnea and respiratory abnormality 04/06/2015   Dysrhythmia    hx of palpitations   E-coli UTI 01/11/2015   Essential hypertension 01/06/2015   GERD (gastroesophageal reflux disease)    Hay fever    Hepatitis    c treated with pills   HLD (hyperlipidemia) 01/06/2015   Hypertension    IBS (irritable bowel syndrome)    ILD (interstitial lung disease) ([place]) 12/11/2016   Leg cramps, sleep related 12/01/2015   Pneumonia    Rheumatoid arthritis ([place]) 1996   Sinus tachycardia 01/11/2015   LT monitor 01/2019: normal sinus rhythm, occ short bursts of PAT, accelerated junctional rhythm, no sig arrhythmias.     Past Surgical History:  Procedure Laterality Date   ABDOMINAL HYSTERECTOMY     BALLOON DILATION N/A 03/15/2018   Procedure: BALLOON DILATION;  Surgeon: Carol Ada, MD;  [facility];   Service: Endoscopy;  Laterality: N/A;   CARPAL TUNNEL RELEASE Left 04/19/2018   Procedure: CARPAL TUNNEL RELEASE;  Surgeon: Ashok Pall, MD;  Location: [facility];  Service: Neurosurgery;  Laterality: Left;  CARPAL TUNNEL RELEASE   CESAREAN SECTION     COLONOSCOPY WITH PROPOFOL N/A 04/02/2015   Procedure: COLONOSCOPY WITH PROPOFOL;  Surgeon: Carol Ada, MD;  [facility];  Service: Endoscopy;  Laterality: N/A;   ESOPHAGOGASTRODUODENOSCOPY (EGD) WITH PROPOFOL N/A 04/02/2015   Procedure: ESOPHAGOGASTRODUODENOSCOPY (EGD) WITH PROPOFOL;  Surgeon: Carol Ada, MD;  [facility];  Service: Endoscopy;  Laterality: N/A;   ESOPHAGOGASTRODUODENOSCOPY (EGD) WITH PROPOFOL N/A 03/15/2018   Procedure: ESOPHAGOGASTRODUODENOSCOPY (EGD) WITH PROPOFOL;  Surgeon: Carol Ada, MD;  [facility];  Service: Endoscopy;  Laterality: N/A;   HEMORRHOID SURGERY     HERNIA REPAIR     HERNIA REPAIR      Family History  Problem Relation Age of Onset   Diabetes Mother    Heart failure Mother    Throat cancer Brother        throat cancer    Asthma Other    COPD Other    Hypertension Other    Stroke Other    Heart disease Other     Social History   Socioeconomic History   Marital status: Single    Spouse name:  Not on file   Number of children: Not on file   Years of education: Not on file   Highest education level: Not on file  Occupational History   Occupation: retired  Tobacco Use   Smoking status: Former    Packs/day: 0.75    Years: 25.00    Additional pack years: 0.00    Total pack years: 18.75    Types: Cigarettes    Quit date: 01/12/2011    Years since quitting: 12.0   Smokeless tobacco: Never  Vaping Use   Vaping Use: Never used  Substance and Sexual Activity   Alcohol use: Not Currently    Comment: 2 years ago (2021)   Drug use: No   Sexual activity: Not Currently  Other Topics Concern   Not on file  Social History Narrative   Not on file   Social Determinants  of Health   Financial Resource Strain: Not on file  Food Insecurity: Not on file  Transportation Needs: Not on file  Physical Activity: Not on file  Stress: Not on file  Social Connections: Not on file  Intimate Partner Violence: Not on file     Physical Exam   Vitals:   02/01/23 2220  BP: 114/67  Pulse: 83  Resp: 17  Temp: 98.8 F (37.1 C)  SpO2: 100%    CONSTITUTIONAL: Well-appearing, NAD NEURO/PSYCH:  Alert and oriented x 3, no focal deficits EYES:  eyes equal and reactive ENT/NECK:  no LAD, no JVD CARDIO: Regular rate, well-perfused, normal S1 and S2 PULM:  CTAB no wheezing or rhonchi GI/GU:  non-distended, non-tender MSK/SPINE:  No gross deformities, no edema SKIN:  no rash, atraumatic   *Additional and/or pertinent findings included in MDM below  Diagnostic and Interventional Summary    EKG Interpretation  Date/Time:    Ventricular Rate:    PR Interval:    QRS Duration:   QT Interval:    QTC Calculation:   R Axis:     Text Interpretation:         Labs Reviewed - No data to display  DG Knee Complete 4 Views Right  Final Result    DG Knee Complete 4 Views Left  Final Result    DG Pelvis Portable  Final Result      Medications  acetaminophen (TYLENOL) tablet 1,000 mg (1,000 mg Oral Given 02/02/23 0039)  traMADol (ULTRAM) tablet 50 mg (50 mg Oral Given 02/02/23 0039)     Procedures  /  Critical Care Procedures  ED Course and Medical Decision Making  Initial Impression and Ddx Some mild limited range of motion to the knees due to pain.  Same for the right hip.  Fracture is considered.  Neurovascularly intact lower extremities.  Denies any other trauma or complaints.  Past medical/surgical history that increases complexity of ED encounter: COPD  Interpretation of Diagnostics I personally reviewed the knee x-rays, pelvis x-ray and my interpretation is as follows: No fracture  Radiology commenting on fairly advanced arthritis.  Patient  Reassessment and Ultimate Disposition/Management     With no emergent process patient is appropriate for discharge.  Patient management required discussion with the following services or consulting groups:  None  Complexity of Problems Addressed Acute complicated illness or Injury  Additional Data Reviewed and Analyzed Further history obtained from: Further history from spouse/family member  Additional Factors Impacting ED Encounter Risk None  Barth Kirks. Sedonia Small, [place] [email or web address]  Final Clinical Impressions(s) /  ED Diagnoses     ICD-10-CM   1. Fall, initial encounter  W19.XXXA     2. Acute pain of both knees  M25.561    M25.562     3. Pain of right hip  M25.551       ED Discharge Orders     None        Discharge Instructions Discussed with and Provided to Patient:    Discharge Instructions      You were evaluated in the Emergency Department and after careful evaluation, we did not find any emergent condition requiring admission or further testing in the hospital.  Your exam/testing today is overall reassuring.  Recommend using Tylenol and your tramadol at home for discomfort and following up with your primary care doctor to discuss your arthritis.  Please return to the Emergency Department if you experience any worsening of your condition.   Thank you for allowing [place] to be a part of your care.      Maudie Flakes, MD 02/02/23 [phone]

## 2023-02-09 DIAGNOSIS — I10 Essential (primary) hypertension: Secondary | ICD-10-CM | POA: Diagnosis not present

## 2023-02-09 DIAGNOSIS — E782 Mixed hyperlipidemia: Secondary | ICD-10-CM | POA: Diagnosis not present

## 2023-02-15 ENCOUNTER — Other Ambulatory Visit: Payer: Self-pay | Admitting: Pulmonary Disease

## 2023-02-21 DIAGNOSIS — R6889 Other general symptoms and signs: Secondary | ICD-10-CM | POA: Diagnosis not present

## 2023-02-23 DIAGNOSIS — R6889 Other general symptoms and signs: Secondary | ICD-10-CM | POA: Diagnosis not present

## 2023-02-28 NOTE — Progress Notes (Deleted)
Office Visit    Patient Name: Anita Gonzalez Date of Encounter: 02/28/2023  Primary Care Provider:  [name], MD Primary Cardiologist:  Dietrich Pates, MD Primary Electrophysiologist: None  Chief Complaint    Anita Gonzalez is a 77 y.o. female with PMH of CAD, aortic atherosclerosis, HTN, HLD, junctional bradycardia (resolved off BB), anemia, CKD, COPD, chronic hypoxic respiratory failure who presents today for 1 year follow-up.  Past Medical History    Past Medical History:  Diagnosis Date   Acute kidney injury (HCC) 01/11/2015   Alcohol abuse, in remission    Anemia    Anemia, iron deficiency 01/28/2015   Arthritis    Asthma    Bronchiectasis (HCC) 03/07/2016   Bronchitis    COPD (chronic obstructive pulmonary disease) (HCC)    Diabetes mellitus without complication (HCC)    type 2   DM II (diabetes mellitus, type II), controlled (HCC) 01/06/2015   Dyspnea and respiratory abnormality 04/06/2015   Dysrhythmia    hx of palpitations   E-coli UTI 01/11/2015   Essential hypertension 01/06/2015   GERD (gastroesophageal reflux disease)    Hay fever    Hepatitis    c treated with pills   HLD (hyperlipidemia) 01/06/2015   Hypertension    IBS (irritable bowel syndrome)    ILD (interstitial lung disease) (HCC) 12/11/2016   Leg cramps, sleep related 12/01/2015   Pneumonia    Rheumatoid arthritis (HCC) 1996   Sinus tachycardia 01/11/2015   LT monitor 01/2019: normal sinus rhythm, occ short bursts of PAT, accelerated junctional rhythm, no sig arrhythmias.    Past Surgical History:  Procedure Laterality Date   ABDOMINAL HYSTERECTOMY     BALLOON DILATION N/A 03/15/2018   Procedure: BALLOON DILATION;  Surgeon: Jeani Hawking, MD;  [facility];  Service: Endoscopy;  Laterality: N/A;   CARPAL TUNNEL RELEASE Left 04/19/2018   Procedure: CARPAL TUNNEL RELEASE;  Surgeon: Coletta Memos, MD;  [facility];  Service: Neurosurgery;  Laterality: Left;  CARPAL TUNNEL RELEASE    CESAREAN SECTION     COLONOSCOPY WITH PROPOFOL N/A 04/02/2015   Procedure: COLONOSCOPY WITH PROPOFOL;  Surgeon: Jeani Hawking, MD;  [facility];  Service: Endoscopy;  Laterality: N/A;   ESOPHAGOGASTRODUODENOSCOPY (EGD) WITH PROPOFOL N/A 04/02/2015   Procedure: ESOPHAGOGASTRODUODENOSCOPY (EGD) WITH PROPOFOL;  Surgeon: Jeani Hawking, MD;  [facility];  Service: Endoscopy;  Laterality: N/A;   ESOPHAGOGASTRODUODENOSCOPY (EGD) WITH PROPOFOL N/A 03/15/2018   Procedure: ESOPHAGOGASTRODUODENOSCOPY (EGD) WITH PROPOFOL;  Surgeon: Jeani Hawking, MD;  [facility];  Service: Endoscopy;  Laterality: N/A;   HEMORRHOID SURGERY     HERNIA REPAIR     HERNIA REPAIR      Allergies  Allergies  Allergen Reactions   Fish Allergy Anaphylaxis, Hives, Swelling and Other (See Comments)   Peanuts [Peanut Oil] Anaphylaxis, Hives, Itching and Swelling   Penicillins Hives, Itching, Swelling and Other (See Comments)    PATIENT HAS HAD A PCN REACTION WITH IMMEDIATE RASH, FACIAL/TONGUE/THROAT SWELLING, SOB, OR LIGHTHEADEDNESS WITH HYPOTENSION:  #  #  YES  #  #  HAS PT DEVELOPED SEVERE RASH INVOLVING MUCUS MEMBRANES or SKIN NECROSIS: #  #  YES  #  # PATIENT HAS HAD A PCN REACTION THAT REQUIRED HOSPITALIZATION:  #  #  YES  #  #  Has patient had a PCN reaction occurring within the last 10 years: No    Iodinated Contrast Media Hives, Itching and Swelling    SWELLING REACTION UNSPECIFIED  Iodine Hives, Itching and Swelling    Patient reports receiving IV dye in abdominal CT scan without issue   Percocet [Oxycodone-Acetaminophen] Nausea Only, Anxiety and Other (See Comments)    Raised blood pressure, Sweats, nervous,dizziness   2,4-D Dimethylamine Other (See Comments)   Peanut (Diagnostic) Other (See Comments)   Peanut Allergen Powder-Dnfp    Hydrocodone Itching and Nausea Only   Other Other (See Comments)    UNSPECIFIED REACTION  Hypersensitive to perfumes, colognes, powders, lotions, room  sprays   Oxycodone Itching, Nausea Only and Other (See Comments)    Sweats and dizziness   Sulfa Antibiotics     UNSPECIFIED REACTION     History of Present Illness    Anita Gonzalez  is a 77 year old female with the above mention past medical history who presents today for 1 year follow-up.  Ms. Soja has been followed by Dr. Tenny Craw since 2016.  She had relocated to [place] from [place] in 2015.  She had a stress test in [place] that was normal.  CT of the chest that showed CAD and aortic atherosclerosis.  She completed a Myoview and 01/2017 that was normal.  She developed junctional bradycardia and was taken off of beta-blockers with resolution to rhythm.  She wore an event monitor 11/2018 shows sinus rhythm with occasional accelerated junctional beats.  She was last seen by Herma Carson, NP and noted chronic shortness of breath.  She also noted occasional palpitations.  She had verapamil discontinued and amlodipine was increased for better blood pressure control.   Since last being seen in the office patient reports***.  Patient denies chest pain, palpitations, dyspnea, PND, orthopnea, nausea, vomiting, dizziness, syncope, edema, weight gain, or early satiety.   ***Notes: -Any lightheadedness or shortness of breath -Last echo was completed 2022 with normal valve function and hyperdynamic EF -Event monitor showed sinus with normal heart rate with rare tachycardia or burst beats. Home Medications    Current Outpatient Medications  Medication Sig Dispense Refill   albuterol (PROVENTIL) (2.5 MG/3ML) 0.083% nebulizer solution USE 1 UNIT DOSE(VIAL)VIA NEBULIZER 4 TIMES A DAY AS NEEDED AS DIRECTED 1080 mL 3   albuterol (VENTOLIN HFA) 108 (90 Base) MCG/ACT inhaler INHALE 2 PUFFS BY MOUTH EVERY 6 HRS AS NEEDED 54 g 4   amLODipine (NORVASC) 5 MG tablet Take 1 tablet (5 mg total) by mouth in the morning and at bedtime. Pt needs to make appt with provider for further refills - 1st attempt 60  tablet 0   aspirin EC 81 MG tablet Take 1 tablet (81 mg total) by mouth daily. 90 tablet 3   atorvastatin (LIPITOR) 40 MG tablet ONE TABLET BY MOUTH DAILY 90 tablet 0   bumetanide (BUMEX) 2 MG tablet TAKE 1 TABLET BY MOUTH 2 TIMES A WEEK 24 tablet 3   cyanocobalamin 1000 MCG tablet Take 1,000 mcg by mouth every Thursday.      cyclobenzaprine (FLEXERIL) 10 MG tablet TAKE ONE TABLET BY MOUTH AT BEDTIME 30 tablet 10   diclofenac Sodium (VOLTAREN) 1 % GEL APPLY 2 G TOPICALLY 4 TIMES A DAY 700 g 2   diphenoxylate-atropine (LOMOTIL) 2.5-0.025 MG tablet Take 1 tablet by mouth 4 (four) times daily as needed for diarrhea or loose stools.      docusate sodium (COLACE) 100 MG capsule Take 100 mg by mouth 2 (two) times daily as needed for mild constipation.     ferrous sulfate 325 (65 FE) MG tablet Take 325 mg by mouth 2 (two)  times daily with a meal.      fluticasone (FLONASE) 50 MCG/ACT nasal spray Place 1 spray into both nostrils daily as needed for allergies or rhinitis.     fluticasone-salmeterol (ADVAIR) 500-50 MCG/ACT AEPB INHALE 1 PUFF BY MOUTH 2 TIMES A DAY 180 each 2   folic acid (FOLVITE) 1 MG tablet ONE TABLET BY MOUTH DAILY 90 tablet 0   gabapentin (NEURONTIN) 600 MG tablet Take 600 mg by mouth 3 (three) times daily.     losartan (COZAAR) 50 MG tablet TAKE 1 TABLET BY MOUTH DAILY 90 tablet 3   metFORMIN (GLUCOPHAGE) 500 MG tablet Take 500 mg by mouth 2 (two) times daily.     metFORMIN (GLUCOPHAGE-XR) 500 MG 24 hr tablet Take 500 mg by mouth 2 (two) times daily.     methocarbamol (ROBAXIN) 500 MG tablet Take 1 tablet (500 mg total) by mouth every 6 (six) hours as needed for muscle spasms. 40 tablet 0   montelukast (SINGULAIR) 10 MG tablet TAKE 1 TABLET BY MOUTH DAILY 90 tablet 3   naproxen (NAPROSYN) 375 MG tablet Take 1 tablet (375 mg total) by mouth 2 (two) times daily. 20 tablet 0   pantoprazole (PROTONIX) 40 MG tablet ONE TABLET BY MOUTH DAILY 90 tablet 2   SPIRIVA RESPIMAT 2.5 MCG/ACT AERS  INHALE 2 PUFFS INTO THE LUNGS DAILY 12 g 0   Tiotropium Bromide Monohydrate (SPIRIVA RESPIMAT) 1.25 MCG/ACT AERS INHALE 2 PUFFS BY MOUTH ONCE A DAY 12 g 2   traZODone (DESYREL) 25 mg TABS tablet Take 25 mg by mouth at bedtime.     Vitamin D, Ergocalciferol, (DRISDOL) 1.25 MG (50000 UNIT) CAPS capsule TAKE 1 CAPSULE BY MOUTH WEEKLY 12 capsule 2   No current facility-administered medications for this visit.     Review of Systems  Please see the history of present illness.    (+)*** (+)***  All other systems reviewed and are otherwise negative except as noted above.  Physical Exam    Wt Readings from Last 3 Encounters:  02/01/23 197 lb 8.5 oz (89.6 kg)  10/18/22 197 lb 9.6 oz (89.6 kg)  07/12/22 189 lb (85.7 kg)   XB:JYNWG were no vitals filed for this visit.,There is no height or weight on file to calculate BMI.  Constitutional:      Appearance: Healthy appearance. Not in distress.  Neck:     Vascular: JVD normal.  Pulmonary:     Effort: Pulmonary effort is normal.     Breath sounds: No wheezing. No rales. Diminished in the bases Cardiovascular:     Normal rate. Regular rhythm. Normal S1. Normal S2.      Murmurs: There is no murmur.  Edema:    Peripheral edema absent.  Abdominal:     Palpations: Abdomen is soft non tender. There is no hepatomegaly.  Skin:    General: Skin is warm and dry.  Neurological:     General: No focal deficit present.     Mental Status: Alert and oriented to person, place and time.     Cranial Nerves: Cranial nerves are intact.  EKG/LABS/ Recent Cardiac Studies    ECG personally reviewed by me today - ***  Risk Assessment/Calculations:   {Does this patient have ATRIAL FIBRILLATION?:[id number]}        Lab Results  Component Value Date   WBC 5.5 12/02/2021   HGB 10.2 (L) 12/02/2021   HCT 34.3 (L) 12/02/2021   MCV 98.3 12/02/2021   PLT 281 12/02/2021  Lab Results  Component Value Date   CREATININE 1.17 (H) 12/02/2021   BUN 12  12/02/2021   NA 143 12/02/2021   K 3.9 12/02/2021   CL 105 12/02/2021   CO2 31 12/02/2021   Lab Results  Component Value Date   ALT 7 12/02/2021   AST 9 (L) 12/02/2021   ALKPHOS 89 12/02/2021   BILITOT 0.2 (L) 12/02/2021   Lab Results  Component Value Date   CHOL 131 04/09/2019   HDL 45 04/09/2019   LDLCALC 72 04/09/2019   TRIG 71 04/09/2019   CHOLHDL 2.9 04/09/2019    No results found for: "HGBA1C"  Cardiac Studies & Procedures     STRESS TESTS  MYOCARDIAL PERFUSION IMAGING 01/31/2017  Narrative  Nuclear stress EF: 66%.  There was no ST segment deviation noted during stress.  This is a low risk study.  The left ventricular ejection fraction is hyperdynamic (>65%).  1. EF 66%, normal wall motion. 2. Fixed, small, mild mid anterior perfusion defect.  Given normal wall motion, suspect soft tissue attenuation rather than infarction.  No ischemia. 3. Low risk study.   ECHOCARDIOGRAM  ECHOCARDIOGRAM COMPLETE 07/27/2021  Narrative ECHOCARDIOGRAM REPORT    Patient Name:   JAYCEONA GAZZOLA Date of Exam: 07/27/2021 Medical Rec #:  [id number]     Height:       61.0 in Accession #:    [id number]    Weight:       214.0 lb Date of Birth:  [birth year]      BSA:          1.944 m Patient Age:    75 years      BP:           134/64 mmHg Patient Gender: F             HR:           89 bpm. Exam Location:  [address]  Procedure: 2D Echo, Cardiac Doppler and Color Doppler  Indications:    R01.1 Murmur  History:        Patient has prior history of Echocardiogram examinations, most recent 04/13/2015. Risk Factors:Hypertension, Sleep Apnea, Diabetes, Dyslipidemia and Former Smoker. Anemia.  Sonographer:    Jorje Guild BS, RDCS Referring Phys: [id number] PAULA V ROSS  IMPRESSIONS   1. There is a dynamic LV gradient . Peak gradient of 33 mmHg with an increase to 49 mmHG with Valsalva. . Left ventricular ejection fraction, by estimation, is >75%. The left ventricle has hyperdynamic  function. The left ventricle has no regional wall motion abnormalities. Left ventricular diastolic parameters are consistent with Grade I diastolic dysfunction (impaired relaxation). 2. Right ventricular systolic function is normal. The right ventricular size is normal. 3. The mitral valve is abnormal. No evidence of mitral valve regurgitation. Mild to moderate mitral stenosis. 4. The aortic valve is grossly normal. Aortic valve regurgitation is not visualized. No aortic stenosis is present.  Comparison(s): 04/13/15 EF 60-65%.  FINDINGS Left Ventricle: There is a dynamic LV gradient . Peak gradient of 33 mmHg with an increase to 49 mmHG with Valsalva. Left ventricular ejection fraction, by estimation, is >75%. The left ventricle has hyperdynamic function. The left ventricle has no regional wall motion abnormalities. The left ventricular internal cavity size was normal in size. There is no left ventricular hypertrophy. Left ventricular diastolic parameters are consistent with Grade I diastolic dysfunction (impaired relaxation).  Right Ventricle: The right ventricular size is normal. Right vetricular wall thickness was not  well visualized. Right ventricular systolic function is normal.  Left Atrium: Left atrial size was normal in size.  Right Atrium: Right atrial size was normal in size.  Pericardium: There is no evidence of pericardial effusion.  Mitral Valve: The mitral valve is abnormal. No evidence of mitral valve regurgitation. Mild to moderate mitral valve stenosis. MV peak gradient, 9.9 mmHg. The mean mitral valve gradient is 5.0 mmHg.  Tricuspid Valve: The tricuspid valve is grossly normal. Tricuspid valve regurgitation is not demonstrated.  Aortic Valve: The aortic valve is grossly normal. Aortic valve regurgitation is not visualized. No aortic stenosis is present.  Pulmonic Valve: The pulmonic valve was grossly normal. Pulmonic valve regurgitation is trivial.  Aorta: The aortic  root and ascending aorta are structurally normal, with no evidence of dilitation.  IAS/Shunts: The atrial septum is grossly normal.   LEFT VENTRICLE PLAX 2D LVIDd:         4.10 cm  Diastology LVIDs:         1.75 cm  LV e' medial:    8.70 cm/s LV PW:         1.00 cm  LV E/e' medial:  11.1 LV IVS:        0.70 cm  LV e' lateral:   9.14 cm/s LVOT diam:     2.00 cm  LV E/e' lateral: 10.5 LVOT Area:     3.14 cm   RIGHT VENTRICLE RV Basal diam:  3.00 cm RV S prime:     14.40 cm/s TAPSE (M-mode): 2.4 cm  LEFT ATRIUM             Index       RIGHT ATRIUM           Index LA diam:        4.20 cm 2.16 cm/m  RA Pressure: 3.00 mmHg LA Vol (A2C):   67.5 ml 34.72 ml/m RA Area:     7.45 cm LA Vol (A4C):   56.6 ml 29.12 ml/m RA Volume:   11.50 ml  5.92 ml/m LA Biplane Vol: 64.4 ml 33.13 ml/m  AORTA Ao Root diam: 2.90 cm Ao Asc diam:  2.70 cm  MITRAL VALVE                TRICUSPID VALVE Estimated RAP:  3.00 mmHg MV Peak grad:  9.9 mmHg MV Mean grad:  5.0 mmHg     SHUNTS MV Vmax:       1.57 m/s     Systemic Diam: 2.00 cm MV Vmean:      104.0 cm/s MV Decel Time: 243 msec MV E velocity: 96.30 cm/s MV A velocity: 170.00 cm/s MV E/A ratio:  0.57  Kristeen Miss MD Electronically signed by Kristeen Miss MD Signature Date/Time: 07/27/2021/3:42:37 PM    Final    MONITORS  LONG TERM MONITOR (3-14 DAYS) 08/02/2021  Narrative Patch Wear Time:  14 days and 0 hours (2022-08-30T20:18:59-0400 to 2022-09-13T20:19:01-399)  Predominant rhythm is SR   Rates 53 t o136 bpm  Average HR 77 bpm    Rare PACs, PVCs SHort bursts of SVT   Fastest 13 beats at 193 bpm    Longest 11.2 sec at 146 bpm No diary entries           Assessment & Plan    1.  Nonobstructive CAD: -Patient had atherosclerosis in coronaries noted on CT in 2017 with subsequent normal Myoview in 2018. -Today patient reports***  2.  Essential hypertension: -Patient's blood pressure today was*** -  Continue***  3.  Shortness  of breath: -Patient has chronic shortness of breath and today reports***  4.  Hyperlipidemia: -Patient's last LDL cholesterol was*** -Continue***      Disposition: Follow-up with Dietrich Pates, MD or APP in *** months {Are you ordering a CV Procedure (e.g. stress test, cath, DCCV, TEE, etc)?   Press F2        :[id number]}   Medication Adjustments/Labs and Tests Ordered: Current medicines are reviewed at length with the patient today.  Concerns regarding medicines are outlined above.   Signed, Napoleon Form, Leodis Rains, NP 02/28/2023, 8:41 AM [facility]  Note:  This document was prepared using Dragon voice recognition software and may include unintentional dictation errors.

## 2023-03-01 ENCOUNTER — Ambulatory Visit: Payer: Medicare HMO | Admitting: Nurse Practitioner

## 2023-03-01 DIAGNOSIS — E782 Mixed hyperlipidemia: Secondary | ICD-10-CM

## 2023-03-01 DIAGNOSIS — I10 Essential (primary) hypertension: Secondary | ICD-10-CM

## 2023-03-01 DIAGNOSIS — E119 Type 2 diabetes mellitus without complications: Secondary | ICD-10-CM

## 2023-03-01 DIAGNOSIS — I2510 Atherosclerotic heart disease of native coronary artery without angina pectoris: Secondary | ICD-10-CM

## 2023-03-06 DIAGNOSIS — W19XXXD Unspecified fall, subsequent encounter: Secondary | ICD-10-CM | POA: Diagnosis not present

## 2023-03-06 DIAGNOSIS — J449 Chronic obstructive pulmonary disease, unspecified: Secondary | ICD-10-CM | POA: Diagnosis not present

## 2023-03-06 DIAGNOSIS — I119 Hypertensive heart disease without heart failure: Secondary | ICD-10-CM | POA: Diagnosis not present

## 2023-03-06 DIAGNOSIS — E782 Mixed hyperlipidemia: Secondary | ICD-10-CM | POA: Diagnosis not present

## 2023-03-06 DIAGNOSIS — M62838 Other muscle spasm: Secondary | ICD-10-CM | POA: Diagnosis not present

## 2023-03-06 DIAGNOSIS — E538 Deficiency of other specified B group vitamins: Secondary | ICD-10-CM | POA: Diagnosis not present

## 2023-03-06 DIAGNOSIS — M25551 Pain in right hip: Secondary | ICD-10-CM | POA: Diagnosis not present

## 2023-03-06 DIAGNOSIS — M2550 Pain in unspecified joint: Secondary | ICD-10-CM | POA: Diagnosis not present

## 2023-03-06 DIAGNOSIS — E1129 Type 2 diabetes mellitus with other diabetic kidney complication: Secondary | ICD-10-CM | POA: Diagnosis not present

## 2023-03-06 DIAGNOSIS — E559 Vitamin D deficiency, unspecified: Secondary | ICD-10-CM | POA: Diagnosis not present

## 2023-03-06 DIAGNOSIS — Z0000 Encounter for general adult medical examination without abnormal findings: Secondary | ICD-10-CM | POA: Diagnosis not present

## 2023-03-06 DIAGNOSIS — R2681 Unsteadiness on feet: Secondary | ICD-10-CM | POA: Diagnosis not present

## 2023-03-06 DIAGNOSIS — R6889 Other general symptoms and signs: Secondary | ICD-10-CM | POA: Diagnosis not present

## 2023-03-07 DIAGNOSIS — H25812 Combined forms of age-related cataract, left eye: Secondary | ICD-10-CM | POA: Diagnosis not present

## 2023-03-07 DIAGNOSIS — R6889 Other general symptoms and signs: Secondary | ICD-10-CM | POA: Diagnosis not present

## 2023-03-07 DIAGNOSIS — H2513 Age-related nuclear cataract, bilateral: Secondary | ICD-10-CM | POA: Diagnosis not present

## 2023-03-07 DIAGNOSIS — J449 Chronic obstructive pulmonary disease, unspecified: Secondary | ICD-10-CM | POA: Diagnosis not present

## 2023-03-07 DIAGNOSIS — H269 Unspecified cataract: Secondary | ICD-10-CM | POA: Diagnosis not present

## 2023-03-12 DIAGNOSIS — M2012 Hallux valgus (acquired), left foot: Secondary | ICD-10-CM | POA: Diagnosis not present

## 2023-03-12 DIAGNOSIS — M2011 Hallux valgus (acquired), right foot: Secondary | ICD-10-CM | POA: Diagnosis not present

## 2023-03-12 DIAGNOSIS — G5761 Lesion of plantar nerve, right lower limb: Secondary | ICD-10-CM | POA: Diagnosis not present

## 2023-03-12 DIAGNOSIS — R6889 Other general symptoms and signs: Secondary | ICD-10-CM | POA: Diagnosis not present

## 2023-03-12 DIAGNOSIS — M2042 Other hammer toe(s) (acquired), left foot: Secondary | ICD-10-CM | POA: Diagnosis not present

## 2023-03-12 DIAGNOSIS — M792 Neuralgia and neuritis, unspecified: Secondary | ICD-10-CM | POA: Diagnosis not present

## 2023-03-12 DIAGNOSIS — G5762 Lesion of plantar nerve, left lower limb: Secondary | ICD-10-CM | POA: Diagnosis not present

## 2023-03-12 DIAGNOSIS — M2041 Other hammer toe(s) (acquired), right foot: Secondary | ICD-10-CM | POA: Diagnosis not present

## 2023-03-13 DIAGNOSIS — I10 Essential (primary) hypertension: Secondary | ICD-10-CM | POA: Diagnosis not present

## 2023-03-13 DIAGNOSIS — E782 Mixed hyperlipidemia: Secondary | ICD-10-CM | POA: Diagnosis not present

## 2023-03-16 ENCOUNTER — Telehealth: Payer: Self-pay | Admitting: Hematology

## 2023-03-16 ENCOUNTER — Telehealth: Payer: Self-pay

## 2023-03-16 NOTE — Telephone Encounter (Signed)
Pt called again (LVM) requesting an appt w/Dr. Mosetta Putt.  Both voicemails sent to Lynda Rainwater in [facility] Scheduling with no reply.  2nd voicemail was eventually sent to Boone Master in [facility] Scheduling d/t [place]'s voicemail box is full.  Requested if Boone Master would pleas call pt back to ger her scheduled with Dr. Mosetta Putt w/in the next 1 to 2 wks.

## 2023-03-20 ENCOUNTER — Other Ambulatory Visit: Payer: Self-pay

## 2023-03-20 DIAGNOSIS — D500 Iron deficiency anemia secondary to blood loss (chronic): Secondary | ICD-10-CM

## 2023-03-20 DIAGNOSIS — R6889 Other general symptoms and signs: Secondary | ICD-10-CM | POA: Diagnosis not present

## 2023-03-20 DIAGNOSIS — E611 Iron deficiency: Secondary | ICD-10-CM

## 2023-03-21 ENCOUNTER — Inpatient Hospital Stay (HOSPITAL_BASED_OUTPATIENT_CLINIC_OR_DEPARTMENT_OTHER): Payer: Medicare HMO | Admitting: Hematology

## 2023-03-21 ENCOUNTER — Inpatient Hospital Stay: Payer: Medicare HMO | Attending: Hematology

## 2023-03-21 ENCOUNTER — Other Ambulatory Visit: Payer: Self-pay

## 2023-03-21 ENCOUNTER — Encounter: Payer: Self-pay | Admitting: Hematology

## 2023-03-21 ENCOUNTER — Inpatient Hospital Stay: Payer: Medicare HMO

## 2023-03-21 VITALS — BP 129/65 | HR 77 | Temp 98.6°F | Resp 18

## 2023-03-21 DIAGNOSIS — E538 Deficiency of other specified B group vitamins: Secondary | ICD-10-CM | POA: Diagnosis not present

## 2023-03-21 DIAGNOSIS — D509 Iron deficiency anemia, unspecified: Secondary | ICD-10-CM | POA: Insufficient documentation

## 2023-03-21 DIAGNOSIS — D500 Iron deficiency anemia secondary to blood loss (chronic): Secondary | ICD-10-CM

## 2023-03-21 DIAGNOSIS — R6889 Other general symptoms and signs: Secondary | ICD-10-CM | POA: Diagnosis not present

## 2023-03-21 DIAGNOSIS — E611 Iron deficiency: Secondary | ICD-10-CM

## 2023-03-21 LAB — IRON AND IRON BINDING CAPACITY (CC-WL,HP ONLY)
Iron: 34 ug/dL (ref 28–170)
Saturation Ratios: 10 % — ABNORMAL LOW (ref 10.4–31.8)
TIBC: 329 ug/dL (ref 250–450)
UIBC: 295 ug/dL (ref 148–442)

## 2023-03-21 LAB — CMP (CANCER CENTER ONLY)
ALT: 14 U/L (ref 0–44)
AST: 15 U/L (ref 15–41)
Albumin: 4.2 g/dL (ref 3.5–5.0)
Alkaline Phosphatase: 93 U/L (ref 38–126)
Anion gap: 7 (ref 5–15)
BUN: 18 mg/dL (ref 8–23)
CO2: 31 mmol/L (ref 22–32)
Calcium: 9.2 mg/dL (ref 8.9–10.3)
Chloride: 105 mmol/L (ref 98–111)
Creatinine: 1.34 mg/dL — ABNORMAL HIGH (ref 0.44–1.00)
GFR, Estimated: 41 mL/min — ABNORMAL LOW (ref >60)
Glucose, Bld: 116 mg/dL — ABNORMAL HIGH (ref 70–99)
Potassium: 3.8 mmol/L (ref 3.5–5.1)
Sodium: 143 mmol/L (ref 135–145)
Total Bilirubin: 0.2 mg/dL — ABNORMAL LOW (ref 0.3–1.2)
Total Protein: 7.2 g/dL (ref 6.5–8.1)

## 2023-03-21 LAB — CBC WITH DIFFERENTIAL (CANCER CENTER ONLY)
Abs Immature Granulocytes: 0.02 10*3/uL (ref 0.00–0.07)
Basophils Absolute: 0 10*3/uL (ref 0.0–0.1)
Basophils Relative: 0 %
Eosinophils Absolute: 0.2 10*3/uL (ref 0.0–0.5)
Eosinophils Relative: 3 %
HCT: 31.5 % — ABNORMAL LOW (ref 36.0–46.0)
Hemoglobin: 9.9 g/dL — ABNORMAL LOW (ref 12.0–15.0)
Immature Granulocytes: 0 %
Lymphocytes Relative: 28 %
Lymphs Abs: 1.7 10*3/uL (ref 0.7–4.0)
MCH: 29.6 pg (ref 26.0–34.0)
MCHC: 31.4 g/dL (ref 30.0–36.0)
MCV: 94.3 fL (ref 80.0–100.0)
Monocytes Absolute: 0.5 10*3/uL (ref 0.1–1.0)
Monocytes Relative: 8 %
Neutro Abs: 3.8 10*3/uL (ref 1.7–7.7)
Neutrophils Relative %: 61 %
Platelet Count: 307 10*3/uL (ref 150–400)
RBC: 3.34 MIL/uL — ABNORMAL LOW (ref 3.87–5.11)
RDW: 17.2 % — ABNORMAL HIGH (ref 11.5–15.5)
WBC Count: 6.1 10*3/uL (ref 4.0–10.5)
nRBC: 0 % (ref 0.0–0.2)

## 2023-03-21 LAB — FERRITIN: Ferritin: 33 ng/mL (ref 11–307)

## 2023-03-21 LAB — VITAMIN B12: Vitamin B-12: 1262 pg/mL — ABNORMAL HIGH (ref 180–914)

## 2023-03-21 MED ORDER — CYANOCOBALAMIN 1000 MCG/ML IJ SOLN
1000.0000 ug | Freq: Once | INTRAMUSCULAR | Status: AC
  Administered 2023-03-21: 1000 ug via INTRAMUSCULAR
  Filled 2023-03-21: qty 1

## 2023-03-21 NOTE — Assessment & Plan Note (Signed)
from GI AVM bleeding  -She has moderate anemia with baseline hemoglobin in 8-9 range, MCV normal, ferritin 16, serum iron 13, URBC elevated at 430, iron saturation 3%, this is consistent with iron deficient anemia -Anemia has been several years, likely secondary to slow GI bleeding. Her stool OB was positive. Colonoscopy showed AVM, last done in 03/2015. Her 03/15/18 Upper Endoscopy with Dr Elnoria Howard showed no evidence of bleeding.  -Her baseline SPEP and UPEP were negative.  -She also may have a component of anemia of chronic disease secondary to kidney disease and rheumatoid arthritis -She has received IV Feraheme as needed since 2016, most recently 02/2021. She responded well and continues oral iron BID.  -She continues to have progressively worse fatigue. She denies GI bleeding or overt blood loss. Labs reviewed, Hg 9. CMP and iron panel still pending. If her iron is low, I will give IV Ferahame in the near future. -Continue oral B12, Folic acid, iron ferrous sulfate BID daily. Will set up iv feraheme if ferritin<100  -Given symptoms, will monitor with labs and f/u every 2-3 months

## 2023-03-21 NOTE — Progress Notes (Signed)
Anita Gonzalez   Telephone:([phone]) [phone] [phone]   Clinic Follow up Note   Patient Care Team: [name], MD as PCP - General (Internal Medicine) Pricilla Riffle, MD as PCP - Cardiology (Cardiology) Jearld Lesch, MD as Referring Physician (Specialist) Jearld Lesch, MD as Referring Physician (Specialist) Jeani Hawking, MD as Consulting Physician (Gastroenterology) Clyde Lundborg., MD as Referring Physician (Anesthesiology) Larey Dresser, DPM as Consulting Physician (Podiatry) Malachy Mood, MD as Attending Physician (Hematology and Oncology)  Date of Service:  03/21/2023  CHIEF COMPLAINT: f/u of iron deficient anemia   CURRENT THERAPY:   -Oral iron Ferous sulfate BID, Folic Acid, oral B12 reported to be weekly.  -IV Feraheme as needed, last in 02/2021.   ASSESSMENT:  Anita Gonzalez is a 77 y.o. female with   Anemia, iron deficiency, B12 deficiency  from GI AVM bleeding  -She has moderate anemia with baseline hemoglobin in 8-9 range, MCV normal, ferritin 16, serum iron 13, URBC elevated at 430, iron saturation 3%, this is consistent with iron deficient anemia -Anemia has been several years, likely secondary to slow GI bleeding. Her stool OB was positive. Colonoscopy showed AVM, last done in 03/2015. Her 03/15/18 Upper Endoscopy with Dr Elnoria Howard showed no evidence of bleeding.  -Her baseline SPEP and UPEP were negative.  -She also may have a component of anemia of chronic disease secondary to kidney disease and rheumatoid arthritis -She has received IV iron as needed since 2016, most recently 08/2021. She responded well and continues oral iron BID.  -She continues to have progressively worse fatigue.  She has not had a B12 injection since January 2023.  Will give her 1 dose today. -Continue to monitor her iron, B12   PLAN: -lab reviewed hg- 9.9 -no blood transfusion needed -recommended taking Prenatal Vitamin, due to oral Iron  causing constipation -Continue B12 injections as needed, Iv Iron as needed -lab  in 6 months and f/u in 1 year -B12 injection today     SUMMARY OF ONCOLOGIC HISTORY: Oncology History   No history exists.     INTERVAL HISTORY:  Anita Gonzalez is here for a follow up of iron deficient anemia . She was last seen by PA-C Cassie on 12/02/2021. She presents to the clinic alone. Pt reports she stay cold all the time and is very fatigue.     All other systems were reviewed with the patient and are negative.  MEDICAL HISTORY:  Past Medical History:  Diagnosis Date   Acute kidney injury (HCC) 01/11/2015   Alcohol abuse, in remission    Anemia    Anemia, iron deficiency 01/28/2015   Arthritis    Asthma    Bronchiectasis (HCC) 03/07/2016   Bronchitis    COPD (chronic obstructive pulmonary disease) (HCC)    Diabetes mellitus without complication (HCC)    type 2   DM II (diabetes mellitus, type II), controlled (HCC) 01/06/2015   Dyspnea and respiratory abnormality 04/06/2015   Dysrhythmia    hx of palpitations   E-coli UTI 01/11/2015   Essential hypertension 01/06/2015   GERD (gastroesophageal reflux disease)    Hay fever    Hepatitis    c treated with pills   HLD (hyperlipidemia) 01/06/2015   Hypertension    IBS (irritable bowel syndrome)    ILD (interstitial lung disease) (HCC) 12/11/2016   Leg cramps, sleep related 12/01/2015   Pneumonia    Rheumatoid arthritis (HCC) 1996   Sinus tachycardia 01/11/2015  LT monitor 01/2019: normal sinus rhythm, occ short bursts of PAT, accelerated junctional rhythm, no sig arrhythmias.     SURGICAL HISTORY: Past Surgical History:  Procedure Laterality Date   ABDOMINAL HYSTERECTOMY     BALLOON DILATION N/A 03/15/2018   Procedure: BALLOON DILATION;  Surgeon: Jeani Hawking, MD;  Location: [facility];  Service: Endoscopy;  Laterality: N/A;   CARPAL TUNNEL RELEASE Left 04/19/2018   Procedure: CARPAL TUNNEL RELEASE;  Surgeon: Coletta Memos, MD;   Location: [facility];  Service: Neurosurgery;  Laterality: Left;  CARPAL TUNNEL RELEASE   CESAREAN SECTION     COLONOSCOPY WITH PROPOFOL N/A 04/02/2015   Procedure: COLONOSCOPY WITH PROPOFOL;  Surgeon: Jeani Hawking, MD;  Location: [facility];  Service: Endoscopy;  Laterality: N/A;   ESOPHAGOGASTRODUODENOSCOPY (EGD) WITH PROPOFOL N/A 04/02/2015   Procedure: ESOPHAGOGASTRODUODENOSCOPY (EGD) WITH PROPOFOL;  Surgeon: Jeani Hawking, MD;  Location: [facility];  Service: Endoscopy;  Laterality: N/A;   ESOPHAGOGASTRODUODENOSCOPY (EGD) WITH PROPOFOL N/A 03/15/2018   Procedure: ESOPHAGOGASTRODUODENOSCOPY (EGD) WITH PROPOFOL;  Surgeon: Jeani Hawking, MD;  Location: [facility];  Service: Endoscopy;  Laterality: N/A;   HEMORRHOID SURGERY     HERNIA REPAIR     HERNIA REPAIR      I have reviewed the social history and family history with the patient and they are unchanged from previous note.  ALLERGIES:  is allergic to fish allergy; peanuts [peanut oil]; penicillins; iodinated contrast media; iodine; percocet [oxycodone-acetaminophen]; 2,4-d dimethylamine; peanut (diagnostic); peanut allergen powder-dnfp; hydrocodone; other; oxycodone; and sulfa antibiotics.  MEDICATIONS:  Current Outpatient Medications  Medication Sig Dispense Refill   albuterol (PROVENTIL) (2.5 MG/3ML) 0.083% nebulizer solution USE 1 UNIT DOSE(VIAL)VIA NEBULIZER 4 TIMES A DAY AS NEEDED AS DIRECTED 1080 mL 3   albuterol (VENTOLIN HFA) 108 (90 Base) MCG/ACT inhaler INHALE 2 PUFFS BY MOUTH EVERY 6 HRS AS NEEDED 54 g 4   amLODipine (NORVASC) 5 MG tablet Take 1 tablet (5 mg total) by mouth in the morning and at bedtime. Pt needs to make appt with provider for further refills - 1st attempt 60 tablet 0   aspirin EC 81 MG tablet Take 1 tablet (81 mg total) by mouth daily. 90 tablet 3   atorvastatin (LIPITOR) 40 MG tablet ONE TABLET BY MOUTH DAILY 90 tablet 0   bumetanide (BUMEX) 2 MG tablet TAKE 1 TABLET BY MOUTH 2 TIMES A WEEK 24 tablet 3    cyanocobalamin 1000 MCG tablet Take 1,000 mcg by mouth every Thursday.      cyclobenzaprine (FLEXERIL) 10 MG tablet TAKE ONE TABLET BY MOUTH AT BEDTIME 30 tablet 10   diclofenac Sodium (VOLTAREN) 1 % GEL APPLY 2 G TOPICALLY 4 TIMES A DAY 700 g 2   diphenoxylate-atropine (LOMOTIL) 2.5-0.025 MG tablet Take 1 tablet by mouth 4 (four) times daily as needed for diarrhea or loose stools.      docusate sodium (COLACE) 100 MG capsule Take 100 mg by mouth 2 (two) times daily as needed for mild constipation.     ferrous sulfate 325 (65 FE) MG tablet Take 325 mg by mouth 2 (two) times daily with a meal.      fluticasone (FLONASE) 50 MCG/ACT nasal spray Place 1 spray into both nostrils daily as needed for allergies or rhinitis.     fluticasone-salmeterol (ADVAIR) 500-50 MCG/ACT AEPB INHALE 1 PUFF BY MOUTH 2 TIMES A DAY 180 each 2   folic acid (FOLVITE) 1 MG tablet ONE TABLET BY MOUTH DAILY 90 tablet 0   gabapentin (NEURONTIN) 600 MG  tablet Take 600 mg by mouth 3 (three) times daily.     losartan (COZAAR) 50 MG tablet TAKE 1 TABLET BY MOUTH DAILY 90 tablet 3   metFORMIN (GLUCOPHAGE) 500 MG tablet Take 500 mg by mouth 2 (two) times daily.     metFORMIN (GLUCOPHAGE-XR) 500 MG 24 hr tablet Take 500 mg by mouth 2 (two) times daily.     methocarbamol (ROBAXIN) 500 MG tablet Take 1 tablet (500 mg total) by mouth every 6 (six) hours as needed for muscle spasms. 40 tablet 0   montelukast (SINGULAIR) 10 MG tablet TAKE 1 TABLET BY MOUTH DAILY 90 tablet 3   naproxen (NAPROSYN) 375 MG tablet Take 1 tablet (375 mg total) by mouth 2 (two) times daily. 20 tablet 0   pantoprazole (PROTONIX) 40 MG tablet ONE TABLET BY MOUTH DAILY 90 tablet 2   SPIRIVA RESPIMAT 2.5 MCG/ACT AERS INHALE 2 PUFFS INTO THE LUNGS DAILY 12 g 0   Tiotropium Bromide Monohydrate (SPIRIVA RESPIMAT) 1.25 MCG/ACT AERS INHALE 2 PUFFS BY MOUTH ONCE A DAY 12 g 2   traZODone (DESYREL) 25 mg TABS tablet Take 25 mg by mouth at bedtime.     Vitamin D,  Ergocalciferol, (DRISDOL) 1.25 MG (50000 UNIT) CAPS capsule TAKE 1 CAPSULE BY MOUTH WEEKLY 12 capsule 2   No current facility-administered medications for this visit.    PHYSICAL EXAMINATION: ECOG PERFORMANCE STATUS: 1 - Symptomatic but completely ambulatory  Vitals:   03/21/23 1340  BP: 129/65  Pulse: 77  Resp: 18  Temp: 98.6 F (37 C)  SpO2: 99%   Wt Readings from Last 3 Encounters:  02/01/23 197 lb 8.5 oz (89.6 kg)  10/18/22 197 lb 9.6 oz (89.6 kg)  07/12/22 189 lb (85.7 kg)     GENERAL:alert, no distress and comfortable SKIN: skin color normal, no rashes or significant lesions EYES: normal, Conjunctiva are pink and non-injected, sclera clear  NEURO: alert & oriented x 3 with fluent speech LABORATORY DATA:  I have reviewed the data as listed    Latest Ref Rng & Units 03/21/2023   12:57 PM 12/02/2021    2:11 PM 08/04/2021   12:24 PM  CBC  WBC 4.0 - 10.5 K/uL 6.1  5.5  6.2   Hemoglobin 12.0 - 15.0 g/dL 9.9  45.4  9.0   Hematocrit 36.0 - 46.0 % 31.5  34.3  29.9   Platelets 150 - 400 K/uL 307  281  276         Latest Ref Rng & Units 03/21/2023   12:57 PM 12/02/2021    2:11 PM 08/04/2021   12:24 PM  CMP  Glucose 70 - 99 mg/dL 098  119  147   BUN 8 - 23 mg/dL 18  12  16    Creatinine 0.44 - 1.00 mg/dL 8.29  5.62  1.30   Sodium 135 - 145 mmol/L 143  143  144   Potassium 3.5 - 5.1 mmol/L 3.8  3.9  4.0   Chloride 98 - 111 mmol/L 105  105  102   CO2 22 - 32 mmol/L 31  31  30    Calcium 8.9 - 10.3 mg/dL 9.2  9.7  9.5   Total Protein 6.5 - 8.1 g/dL 7.2  7.1  6.7   Total Bilirubin 0.3 - 1.2 mg/dL 0.2  0.2  0.3   Alkaline Phos 38 - 126 U/L 93  89  105   AST 15 - 41 U/L 15  9  11    ALT  0 - 44 U/L 14  7  9        RADIOGRAPHIC STUDIES: I have personally reviewed the radiological images as listed and agreed with the findings in the report. No results found.    No orders of the defined types were placed in this encounter.  All questions were answered. The patient knows to  call the clinic with any problems, questions or concerns. No barriers to learning was detected. The total time spent in the appointment was 20 minutes.     Malachy Mood, MD 03/21/2023   Carolin Coy, CMA, am acting as scribe for Malachy Mood, MD.   I have reviewed the above documentation for accuracy and completeness, and I agree with the above.

## 2023-03-22 DIAGNOSIS — R6889 Other general symptoms and signs: Secondary | ICD-10-CM | POA: Diagnosis not present

## 2023-03-23 DIAGNOSIS — E782 Mixed hyperlipidemia: Secondary | ICD-10-CM | POA: Diagnosis not present

## 2023-03-23 DIAGNOSIS — E1129 Type 2 diabetes mellitus with other diabetic kidney complication: Secondary | ICD-10-CM | POA: Diagnosis not present

## 2023-03-23 DIAGNOSIS — M2550 Pain in unspecified joint: Secondary | ICD-10-CM | POA: Diagnosis not present

## 2023-03-23 DIAGNOSIS — I119 Hypertensive heart disease without heart failure: Secondary | ICD-10-CM | POA: Diagnosis not present

## 2023-03-23 DIAGNOSIS — J449 Chronic obstructive pulmonary disease, unspecified: Secondary | ICD-10-CM | POA: Diagnosis not present

## 2023-03-23 DIAGNOSIS — M62838 Other muscle spasm: Secondary | ICD-10-CM | POA: Diagnosis not present

## 2023-03-23 DIAGNOSIS — R6889 Other general symptoms and signs: Secondary | ICD-10-CM | POA: Diagnosis not present

## 2023-03-23 DIAGNOSIS — E538 Deficiency of other specified B group vitamins: Secondary | ICD-10-CM | POA: Diagnosis not present

## 2023-03-23 DIAGNOSIS — M25551 Pain in right hip: Secondary | ICD-10-CM | POA: Diagnosis not present

## 2023-03-23 DIAGNOSIS — R2681 Unsteadiness on feet: Secondary | ICD-10-CM | POA: Diagnosis not present

## 2023-03-23 LAB — FOLATE RBC
Folate, Hemolysate: >620 ng/mL
Folate, RBC: >1968 ng/mL (ref >498)
Hematocrit: 31.5 % — ABNORMAL LOW (ref 34.0–46.6)

## 2023-03-28 ENCOUNTER — Telehealth: Payer: Self-pay | Admitting: Pulmonary Disease

## 2023-03-28 ENCOUNTER — Other Ambulatory Visit: Payer: Self-pay

## 2023-03-28 DIAGNOSIS — J398 Other specified diseases of upper respiratory tract: Secondary | ICD-10-CM

## 2023-03-28 DIAGNOSIS — J4550 Severe persistent asthma, uncomplicated: Secondary | ICD-10-CM

## 2023-03-28 DIAGNOSIS — J479 Bronchiectasis, uncomplicated: Secondary | ICD-10-CM

## 2023-03-28 NOTE — Telephone Encounter (Signed)
Spoke with patient she is requesting an order for smaller POC machine. She currently has one but states its too heavy. She has just received new POC in the last year or two.  Dr. Francine Graven please advise if its okay to place order

## 2023-03-28 NOTE — Telephone Encounter (Signed)
Yes ok to place order for a smaller POC machine.  Thanks, JD

## 2023-03-28 NOTE — Telephone Encounter (Signed)
Order has been placed to [facility] for POC machine. Please dis-regard order to [facility]

## 2023-03-28 NOTE — Telephone Encounter (Signed)
Mandy from [facility] calling for our assistance in getting this PT a port O2 concentrator. She has one now but it is too heavy. Seen by Elenora Gamma @ [phone]

## 2023-04-06 DIAGNOSIS — H25811 Combined forms of age-related cataract, right eye: Secondary | ICD-10-CM | POA: Diagnosis not present

## 2023-04-06 DIAGNOSIS — H2513 Age-related nuclear cataract, bilateral: Secondary | ICD-10-CM | POA: Diagnosis not present

## 2023-04-06 DIAGNOSIS — R6889 Other general symptoms and signs: Secondary | ICD-10-CM | POA: Diagnosis not present

## 2023-04-06 DIAGNOSIS — I10 Essential (primary) hypertension: Secondary | ICD-10-CM | POA: Diagnosis not present

## 2023-04-13 ENCOUNTER — Other Ambulatory Visit: Payer: Self-pay | Admitting: Hematology

## 2023-04-13 ENCOUNTER — Other Ambulatory Visit: Payer: Self-pay | Admitting: Internal Medicine

## 2023-04-13 DIAGNOSIS — J4550 Severe persistent asthma, uncomplicated: Secondary | ICD-10-CM

## 2023-04-13 DIAGNOSIS — I10 Essential (primary) hypertension: Secondary | ICD-10-CM | POA: Diagnosis not present

## 2023-04-13 DIAGNOSIS — E782 Mixed hyperlipidemia: Secondary | ICD-10-CM | POA: Diagnosis not present

## 2023-04-16 ENCOUNTER — Telehealth: Payer: Self-pay | Admitting: Internal Medicine

## 2023-04-16 ENCOUNTER — Other Ambulatory Visit: Payer: Self-pay | Admitting: Internal Medicine

## 2023-04-16 MED ORDER — AMLODIPINE BESYLATE 5 MG PO TABS: ORAL_TABLET | ORAL | 0 refills | Status: DC

## 2023-04-16 NOTE — Telephone Encounter (Signed)
Pt refill for Vit D 50,000 units declined for now... will forward to Dr Tenny Craw to see what maintenance she needs going forward. She had an elevated Vit D level in [facility] with her PCP.

## 2023-04-16 NOTE — Telephone Encounter (Signed)
Pt c/o medication issue:  1. Name of Medication:   amLODipine (NORVASC) 5 MG tablet    2. How are you currently taking this medication (dosage and times per day)?   TAKE 1 TABLET BY MOUTH IN THE MORNING AND AT BEDTIME    3. Are you having a reaction (difficulty breathing--STAT)? No  4. What is your medication issue? Pharmacy states that refill request is only for 18 tablets. They are needing clarification, should ir be for 180 tablets. Please advise

## 2023-04-16 NOTE — Telephone Encounter (Signed)
Rx for Amlodipine that was sent in for just a few days to get her through until her appt was changed to a 30 day and advised that she needs to keep her upcoming appt.

## 2023-04-16 NOTE — Telephone Encounter (Signed)
Patient called in and requesting more refills of the Amlodipine 5 mg to be taken daily. Patient informed that she will be given enough to make it to her upcoming appointment with Jari Favre, NP on 05/04/23 and at that appointment inform staff about needing more refills. Patient verbalized understanding and all (if any) questions were answered. Rx sent to patient's pharmacy on file.

## 2023-04-17 ENCOUNTER — Other Ambulatory Visit: Payer: Self-pay | Admitting: Pulmonary Disease

## 2023-04-17 NOTE — Telephone Encounter (Signed)
Need to get Vit D Level checked  before reviewing this renewal   Safe for now until it gets checked again to take 2000 per day

## 2023-04-18 NOTE — Telephone Encounter (Signed)
Pt advised... asks to hold off taking any more until we check her labs 05/04/23 so she does not have to buy anymore.

## 2023-04-19 DIAGNOSIS — G5761 Lesion of plantar nerve, right lower limb: Secondary | ICD-10-CM | POA: Diagnosis not present

## 2023-04-19 DIAGNOSIS — M2041 Other hammer toe(s) (acquired), right foot: Secondary | ICD-10-CM | POA: Diagnosis not present

## 2023-04-19 DIAGNOSIS — B351 Tinea unguium: Secondary | ICD-10-CM | POA: Diagnosis not present

## 2023-04-19 DIAGNOSIS — E1142 Type 2 diabetes mellitus with diabetic polyneuropathy: Secondary | ICD-10-CM | POA: Diagnosis not present

## 2023-04-19 DIAGNOSIS — M2011 Hallux valgus (acquired), right foot: Secondary | ICD-10-CM | POA: Diagnosis not present

## 2023-04-19 DIAGNOSIS — M2042 Other hammer toe(s) (acquired), left foot: Secondary | ICD-10-CM | POA: Diagnosis not present

## 2023-04-19 DIAGNOSIS — R6889 Other general symptoms and signs: Secondary | ICD-10-CM | POA: Diagnosis not present

## 2023-04-19 DIAGNOSIS — M792 Neuralgia and neuritis, unspecified: Secondary | ICD-10-CM | POA: Diagnosis not present

## 2023-04-19 DIAGNOSIS — M2012 Hallux valgus (acquired), left foot: Secondary | ICD-10-CM | POA: Diagnosis not present

## 2023-04-19 DIAGNOSIS — G5762 Lesion of plantar nerve, left lower limb: Secondary | ICD-10-CM | POA: Diagnosis not present

## 2023-04-20 DIAGNOSIS — R6889 Other general symptoms and signs: Secondary | ICD-10-CM | POA: Diagnosis not present

## 2023-04-23 DIAGNOSIS — J449 Chronic obstructive pulmonary disease, unspecified: Secondary | ICD-10-CM | POA: Diagnosis not present

## 2023-04-23 DIAGNOSIS — E1122 Type 2 diabetes mellitus with diabetic chronic kidney disease: Secondary | ICD-10-CM | POA: Diagnosis not present

## 2023-04-23 DIAGNOSIS — D509 Iron deficiency anemia, unspecified: Secondary | ICD-10-CM | POA: Diagnosis not present

## 2023-04-23 DIAGNOSIS — N1831 Chronic kidney disease, stage 3a: Secondary | ICD-10-CM | POA: Diagnosis not present

## 2023-04-23 DIAGNOSIS — E785 Hyperlipidemia, unspecified: Secondary | ICD-10-CM | POA: Diagnosis not present

## 2023-04-23 DIAGNOSIS — I509 Heart failure, unspecified: Secondary | ICD-10-CM | POA: Diagnosis not present

## 2023-04-23 DIAGNOSIS — I130 Hypertensive heart and chronic kidney disease with heart failure and stage 1 through stage 4 chronic kidney disease, or unspecified chronic kidney disease: Secondary | ICD-10-CM | POA: Diagnosis not present

## 2023-04-23 DIAGNOSIS — R6889 Other general symptoms and signs: Secondary | ICD-10-CM | POA: Diagnosis not present

## 2023-04-30 DIAGNOSIS — R6889 Other general symptoms and signs: Secondary | ICD-10-CM | POA: Diagnosis not present

## 2023-05-03 ENCOUNTER — Other Ambulatory Visit: Payer: Self-pay | Admitting: Internal Medicine

## 2023-05-04 ENCOUNTER — Ambulatory Visit: Payer: Medicare HMO | Admitting: Physician Assistant

## 2023-05-04 ENCOUNTER — Ambulatory Visit: Payer: Medicare HMO | Admitting: Nurse Practitioner

## 2023-05-04 ENCOUNTER — Encounter: Payer: Self-pay | Admitting: Physician Assistant

## 2023-05-04 ENCOUNTER — Ambulatory Visit: Payer: Medicare HMO | Attending: Nurse Practitioner | Admitting: Physician Assistant

## 2023-05-04 VITALS — BP 116/74 | HR 71 | Ht 61.0 in | Wt 196.0 lb

## 2023-05-04 DIAGNOSIS — I498 Other specified cardiac arrhythmias: Secondary | ICD-10-CM | POA: Diagnosis not present

## 2023-05-04 DIAGNOSIS — R0989 Other specified symptoms and signs involving the circulatory and respiratory systems: Secondary | ICD-10-CM

## 2023-05-04 DIAGNOSIS — I2510 Atherosclerotic heart disease of native coronary artery without angina pectoris: Secondary | ICD-10-CM

## 2023-05-04 DIAGNOSIS — R011 Cardiac murmur, unspecified: Secondary | ICD-10-CM | POA: Diagnosis not present

## 2023-05-04 DIAGNOSIS — R6889 Other general symptoms and signs: Secondary | ICD-10-CM | POA: Diagnosis not present

## 2023-05-04 DIAGNOSIS — R0602 Shortness of breath: Secondary | ICD-10-CM | POA: Diagnosis not present

## 2023-05-04 DIAGNOSIS — I10 Essential (primary) hypertension: Secondary | ICD-10-CM

## 2023-05-04 NOTE — Patient Instructions (Signed)
Medication Instructions:  Your physician recommends that you continue on your current medications as directed. Please refer to the Current Medication list given to you today.  *If you need a refill on your cardiac medications before your next appointment, please call your pharmacy*  Lab Work: Lipids and lft's with primary care provider. If you have labs (blood work) drawn today and your tests are completely normal, you will receive your results only by: MyChart Message (if you have MyChart) OR A paper copy in the mail If you have any lab test that is abnormal or we need to change your treatment, we will call you to review the results.  Testing/Procedures: Your physician has requested that you have a carotid duplex. This test is an ultrasound of the carotid arteries in your neck. It looks at blood flow through these arteries that supply the brain with blood. Allow one hour for this exam. There are no restrictions or special instructions.   Follow-Up: At [facility], you and your health needs are our priority.  As part of our continuing mission to provide you with exceptional heart care, we have created designated Provider Care Teams.  These Care Teams include your primary Cardiologist (physician) and Advanced Practice Providers (APPs -  Physician Assistants and Nurse Practitioners) who all work together to provide you with the care you need, when you need it.  We recommend signing up for the patient portal called "MyChart".  Sign up information is provided on this After Visit Summary.  MyChart is used to connect with patients for Virtual Visits (Telemedicine).  Patients are able to view lab/test results, encounter notes, upcoming appointments, etc.  Non-urgent messages can be sent to your provider as well.   To learn more about what you can do with MyChart, go to [email or web address].    Your next appointment:   1 year(s)  Provider:   Dietrich Pates, MD   Other  Instructions Increase fluid intake to at least 64 oz daily.  Heart-Healthy Eating Plan Many factors influence your heart health, including eating and exercise habits. Heart health is also called coronary health. Coronary risk increases with abnormal blood fat (lipid) levels. A heart-healthy eating plan includes limiting unhealthy fats, increasing healthy fats, limiting salt (sodium) intake, and making other diet and lifestyle changes. What is my plan? Your health care provider may recommend that: You limit your fat intake to _________% or less of your total calories each day. You limit your saturated fat intake to _________% or less of your total calories each day. You limit the amount of cholesterol in your diet to less than _________ mg per day. You limit the amount of sodium in your diet to less than _________ mg per day. What are tips for following this plan? Cooking Cook foods using methods other than frying. Baking, boiling, grilling, and broiling are all good options. Other ways to reduce fat include: Removing the skin from poultry. Removing all visible fats from meats. Steaming vegetables in water or broth. Meal planning  At meals, imagine dividing your plate into fourths: Fill one-half of your plate with vegetables and green salads. Fill one-fourth of your plate with whole grains. Fill one-fourth of your plate with lean protein foods. Eat 2-4 cups of vegetables per day. One cup of vegetables equals 1 cup (91 g) broccoli or cauliflower florets, 2 medium carrots, 1 large bell pepper, 1 large sweet potato, 1 large tomato, 1 medium white potato, 2 cups (150 g) raw leafy greens. Eat 1-2 cups  of fruit per day. One cup of fruit equals 1 small apple, 1 large banana, 1 cup (237 g) mixed fruit, 1 large orange,  cup (82 g) dried fruit, 1 cup (240 mL) 100% fruit juice. Eat more foods that contain soluble fiber. Examples include apples, broccoli, carrots, beans, peas, and barley. Aim to get  25-30 g of fiber per day. Increase your consumption of legumes, nuts, and seeds to 4-5 servings per week. One serving of dried beans or legumes equals  cup (90 g) cooked, 1 serving of nuts is  oz (12 almonds, 24 pistachios, or 7 walnut halves), and 1 serving of seeds equals  oz (8 g). Fats Choose healthy fats more often. Choose monounsaturated and polyunsaturated fats, such as olive and canola oils, avocado oil, flaxseeds, walnuts, almonds, and seeds. Eat more omega-3 fats. Choose salmon, mackerel, sardines, tuna, flaxseed oil, and ground flaxseeds. Aim to eat fish at least 2 times each week. Check food labels carefully to identify foods with trans fats or high amounts of saturated fat. Limit saturated fats. These are found in animal products, such as meats, butter, and cream. Plant sources of saturated fats include palm oil, palm kernel oil, and coconut oil. Avoid foods with partially hydrogenated oils in them. These contain trans fats. Examples are stick margarine, some tub margarines, cookies, crackers, and other baked goods. Avoid fried foods. General information Eat more home-cooked food and less restaurant, buffet, and fast food. Limit or avoid alcohol. Limit foods that are high in added sugar and simple starches such as foods made using white refined flour (white breads, pastries, sweets). Lose weight if you are overweight. Losing just 5-10% of your body weight can help your overall health and prevent diseases such as diabetes and heart disease. Monitor your sodium intake, especially if you have high blood pressure. Talk with your health care provider about your sodium intake. Try to incorporate more vegetarian meals weekly. What foods should I eat? Fruits All fresh, canned (in natural juice), or frozen fruits. Vegetables Fresh or frozen vegetables (raw, steamed, roasted, or grilled). Green salads. Grains Most grains. Choose whole wheat and whole grains most of the time. Rice and  pasta, including brown rice and pastas made with whole wheat. Meats and other proteins Lean, well-trimmed beef, veal, pork, and lamb. Chicken and [place] without skin. All fish and shellfish. Wild duck, rabbit, pheasant, and venison. Egg whites or low-cholesterol egg substitutes. Dried beans, peas, lentils, and tofu. Seeds and most nuts. Dairy Low-fat or nonfat cheeses, including ricotta and mozzarella. Skim or 1% milk (liquid, powdered, or evaporated). Buttermilk made with low-fat milk. Nonfat or low-fat yogurt. Fats and oils Non-hydrogenated (trans-free) margarines. Vegetable oils, including soybean, sesame, sunflower, olive, avocado, peanut, safflower, corn, canola, and cottonseed. Salad dressings or mayonnaise made with a vegetable oil. Beverages Water (mineral or sparkling). Coffee and tea. Unsweetened ice tea. Diet beverages. Sweets and desserts Sherbet, gelatin, and fruit ice. Small amounts of dark chocolate. Limit all sweets and desserts. Seasonings and condiments All seasonings and condiments. The items listed above may not be a complete list of foods and beverages you can eat. Contact a dietitian for more options. What foods should I avoid? Fruits Canned fruit in heavy syrup. Fruit in cream or butter sauce. Fried fruit. Limit coconut. Vegetables Vegetables cooked in cheese, cream, or butter sauce. Fried vegetables. Grains Breads made with saturated or trans fats, oils, or whole milk. Croissants. Sweet rolls. Donuts. High-fat crackers, such as cheese crackers and chips. Meats and other  proteins Fatty meats, such as hot dogs, ribs, sausage, bacon, rib-eye roast or steak. High-fat deli meats, such as salami and bologna. Caviar. Domestic duck and goose. Organ meats, such as liver. Dairy Cream, sour cream, cream cheese, and creamed cottage cheese. Whole-milk cheeses. Whole or 2% milk (liquid, evaporated, or condensed). Whole buttermilk. Cream sauce or high-fat cheese sauce. Whole-milk  yogurt. Fats and oils Meat fat, or shortening. Cocoa butter, hydrogenated oils, palm oil, coconut oil, palm kernel oil. Solid fats and shortenings, including bacon fat, salt pork, lard, and butter. Nondairy cream substitutes. Salad dressings with cheese or sour cream. Beverages Regular sodas and any drinks with added sugar. Sweets and desserts Frosting. Pudding. Cookies. Cakes. Pies. Milk chocolate or white chocolate. Buttered syrups. Full-fat ice cream or ice cream drinks. The items listed above may not be a complete list of foods and beverages to avoid. Contact a dietitian for more information. Summary Heart-healthy meal planning includes limiting unhealthy fats, increasing healthy fats, limiting salt (sodium) intake and making other diet and lifestyle changes. Lose weight if you are overweight. Losing just 5-10% of your body weight can help your overall health and prevent diseases such as diabetes and heart disease. Focus on eating a balance of foods, including fruits and vegetables, low-fat or nonfat dairy, lean protein, nuts and legumes, whole grains, and heart-healthy oils and fats. This information is not intended to replace advice given to you by your health care provider. Make sure you discuss any questions you have with your health care provider. Document Revised: 12/05/2021 Document Reviewed: 12/05/2021 Elsevier Patient Education  2024 [employer].

## 2023-05-04 NOTE — Progress Notes (Signed)
Cardiology Office Note:  .   Date:  05/04/2023  ID:  Benita Stabile Rottenberg, DOB 1946/08/07, MRN [id number] PCP: [name], MD  [facility] Providers Cardiologist:  Dietrich Pates, MD { History of Present Illness: Marland Kitchen   Anita Gonzalez is a 77 y.o. female with a past medical history of hypertension, hyperlipidemia, junctional bradycardia resolved with stopping beta-blockers, coronary artery disease noted on the CT scan 2017 with normal Myoview 2018, CKD, COPD, chronic hypoxia with respiratory failure on oxygen (2 L) here for follow-up appointment.  Patient saw Tereso Newcomer, PA-C 07/26/2020 and was in junctional rhythm with rates in the 70s.  Verapamil was stopped and amlodipine was increased for better blood pressure control.  She was scheduled to come back in in a couple of weeks for repeat EKG but never did.  Echo was also ordered for shortness of breath and never done.  Patient was last seen August 2022 by Herma Carson, PA-C. She did not come in with her oxygen and needed a portable tank.  Was having trouble with transportation.  Patient denied chest pain, palpitations.  Says her heart rate gets low and at times she gets a little lightheaded.  Usually when she is walking or doing something.  Does not check her pulse and thinks it is going slow.  Today, she tells me that she only needs oxygen when she is walking.  Having lightheadedness that occurred during her appointment.  Heart rate feels like it skips a beat.  Drinking plenty of fluids.  Skipped heartbeats happen a couple times a month and do not really bother her.  Blood pressure is good today however, with lightheadedness would suggest decreasing amlodipine.  Patient wants to hold off on any medicine changes right now.  Arthritis in both knees and swelling.  Also has arthritis in her shoulder that bothers her.  She is on multiple medications for this.  Reports no shortness of breath nor dyspnea on exertion. Reports no chest pain, pressure, or  tightness. No edema, orthopnea, PND.   ROS: ROS located in HPI  Studies Reviewed: .        Echocardiogram 07/2021 with normal heart pump function.  Normal valve function.  Murmur felt to be from vigorous LV function.      Physical Exam:   VS:  There were no vitals taken for this visit.   Wt Readings from Last 3 Encounters:  02/01/23 197 lb 8.5 oz (89.6 kg)  10/18/22 197 lb 9.6 oz (89.6 kg)  07/12/22 189 lb (85.7 kg)    GEN: Well nourished, well developed in no acute distress NECK: No JVD; No carotid bruits CARDIAC: RRR, no murmurs, rubs, gallops RESPIRATORY:  Clear to auscultation without rales, wheezing or rhonchi  ABDOMEN: Soft, non-tender, non-distended EXTREMITIES:  No edema; No deformity   ASSESSMENT AND PLAN: .   1.  Junctional rhythm  -Occasional skipped beat but overall patient asymptomatic  2.  CAD  -Continue current medication regimen which includes amlodipine 5 mg daily, Lipitor 40 mg daily, aspirin 81 mg daily, Bumex 2 g 2 times a week, Cozaar 50 mg daily  3.  Shortness of breath -On chronic oxygen as needed (during ambulation) -Oxygen saturation today is 91% off oxygen  4.  Essential hypertension -Blood pressure 116/74 today -Continue current medications -I offered to decrease amlodipine but patient denied at this time  5.  Heart murmur -Thought to be due to hyperdynamic LV  6.  Bilateral carotid bruit -With dizziness we will update  carotid ultrasound      Dispo: Follow-up in a year with Dr. Tenny Craw  Signed, Sharlene Dory, PA-C

## 2023-05-10 DIAGNOSIS — R6889 Other general symptoms and signs: Secondary | ICD-10-CM | POA: Diagnosis not present

## 2023-05-16 ENCOUNTER — Ambulatory Visit (HOSPITAL_COMMUNITY): Admission: RE | Admit: 2023-05-16 | Payer: Medicare HMO | Source: Ambulatory Visit

## 2023-05-24 DIAGNOSIS — I119 Hypertensive heart disease without heart failure: Secondary | ICD-10-CM | POA: Diagnosis not present

## 2023-05-24 DIAGNOSIS — R6889 Other general symptoms and signs: Secondary | ICD-10-CM | POA: Diagnosis not present

## 2023-05-24 DIAGNOSIS — E1129 Type 2 diabetes mellitus with other diabetic kidney complication: Secondary | ICD-10-CM | POA: Diagnosis not present

## 2023-05-24 DIAGNOSIS — G894 Chronic pain syndrome: Secondary | ICD-10-CM | POA: Diagnosis not present

## 2023-05-24 DIAGNOSIS — J449 Chronic obstructive pulmonary disease, unspecified: Secondary | ICD-10-CM | POA: Diagnosis not present

## 2023-05-24 DIAGNOSIS — Z0000 Encounter for general adult medical examination without abnormal findings: Secondary | ICD-10-CM | POA: Diagnosis not present

## 2023-05-24 DIAGNOSIS — E782 Mixed hyperlipidemia: Secondary | ICD-10-CM | POA: Diagnosis not present

## 2023-05-29 ENCOUNTER — Other Ambulatory Visit: Payer: Self-pay | Admitting: Pulmonary Disease

## 2023-05-29 ENCOUNTER — Other Ambulatory Visit: Payer: Self-pay | Admitting: Internal Medicine

## 2023-05-31 ENCOUNTER — Ambulatory Visit (HOSPITAL_COMMUNITY): Admission: RE | Admit: 2023-05-31 | Payer: Medicare HMO | Source: Ambulatory Visit

## 2023-05-31 DIAGNOSIS — Z0100 Encounter for examination of eyes and vision without abnormal findings: Secondary | ICD-10-CM | POA: Diagnosis not present

## 2023-06-13 ENCOUNTER — Other Ambulatory Visit: Payer: Self-pay | Admitting: Internal Medicine

## 2023-06-13 DIAGNOSIS — J4550 Severe persistent asthma, uncomplicated: Secondary | ICD-10-CM

## 2023-06-15 ENCOUNTER — Other Ambulatory Visit: Payer: Self-pay | Admitting: Internal Medicine

## 2023-06-15 DIAGNOSIS — J4550 Severe persistent asthma, uncomplicated: Secondary | ICD-10-CM

## 2023-06-18 ENCOUNTER — Other Ambulatory Visit: Payer: Self-pay | Admitting: Internal Medicine

## 2023-06-18 DIAGNOSIS — J4550 Severe persistent asthma, uncomplicated: Secondary | ICD-10-CM

## 2023-06-19 ENCOUNTER — Telehealth: Payer: Self-pay | Admitting: Pulmonary Disease

## 2023-06-19 DIAGNOSIS — J4550 Severe persistent asthma, uncomplicated: Secondary | ICD-10-CM

## 2023-06-22 ENCOUNTER — Ambulatory Visit (HOSPITAL_COMMUNITY)
Admission: RE | Admit: 2023-06-22 | Discharge: 2023-06-22 | Disposition: A | Discharge status: Home / Self Care | Payer: Medicare HMO | Source: Ambulatory Visit | Attending: Physician Assistant | Admitting: Physician Assistant

## 2023-06-22 DIAGNOSIS — R0989 Other specified symptoms and signs involving the circulatory and respiratory systems: Secondary | ICD-10-CM | POA: Diagnosis not present

## 2023-06-25 MED ORDER — ALBUTEROL SULFATE HFA 108 (90 BASE) MCG/ACT IN AERS: INHALATION_SPRAY | RESPIRATORY_TRACT | 4 refills | Status: DC

## 2023-06-25 NOTE — Telephone Encounter (Signed)
Refill sent.

## 2023-07-13 DIAGNOSIS — J449 Chronic obstructive pulmonary disease, unspecified: Secondary | ICD-10-CM | POA: Diagnosis not present

## 2023-07-13 DIAGNOSIS — M25512 Pain in left shoulder: Secondary | ICD-10-CM | POA: Diagnosis not present

## 2023-07-13 DIAGNOSIS — G894 Chronic pain syndrome: Secondary | ICD-10-CM | POA: Diagnosis not present

## 2023-07-13 DIAGNOSIS — E782 Mixed hyperlipidemia: Secondary | ICD-10-CM | POA: Diagnosis not present

## 2023-07-13 DIAGNOSIS — Z0000 Encounter for general adult medical examination without abnormal findings: Secondary | ICD-10-CM | POA: Diagnosis not present

## 2023-07-13 DIAGNOSIS — I119 Hypertensive heart disease without heart failure: Secondary | ICD-10-CM | POA: Diagnosis not present

## 2023-07-13 DIAGNOSIS — B1920 Unspecified viral hepatitis C without hepatic coma: Secondary | ICD-10-CM | POA: Diagnosis not present

## 2023-07-13 DIAGNOSIS — E1129 Type 2 diabetes mellitus with other diabetic kidney complication: Secondary | ICD-10-CM | POA: Diagnosis not present

## 2023-07-27 DIAGNOSIS — M19011 Primary osteoarthritis, right shoulder: Secondary | ICD-10-CM | POA: Diagnosis not present

## 2023-07-27 DIAGNOSIS — M19012 Primary osteoarthritis, left shoulder: Secondary | ICD-10-CM | POA: Diagnosis not present

## 2023-07-27 DIAGNOSIS — M1711 Unilateral primary osteoarthritis, right knee: Secondary | ICD-10-CM | POA: Diagnosis not present

## 2023-07-30 DIAGNOSIS — M2042 Other hammer toe(s) (acquired), left foot: Secondary | ICD-10-CM | POA: Diagnosis not present

## 2023-07-30 DIAGNOSIS — M792 Neuralgia and neuritis, unspecified: Secondary | ICD-10-CM | POA: Diagnosis not present

## 2023-07-30 DIAGNOSIS — G5761 Lesion of plantar nerve, right lower limb: Secondary | ICD-10-CM | POA: Diagnosis not present

## 2023-07-30 DIAGNOSIS — G5762 Lesion of plantar nerve, left lower limb: Secondary | ICD-10-CM | POA: Diagnosis not present

## 2023-07-30 DIAGNOSIS — M2011 Hallux valgus (acquired), right foot: Secondary | ICD-10-CM | POA: Diagnosis not present

## 2023-07-30 DIAGNOSIS — M2041 Other hammer toe(s) (acquired), right foot: Secondary | ICD-10-CM | POA: Diagnosis not present

## 2023-07-30 DIAGNOSIS — B351 Tinea unguium: Secondary | ICD-10-CM | POA: Diagnosis not present

## 2023-07-30 DIAGNOSIS — M2012 Hallux valgus (acquired), left foot: Secondary | ICD-10-CM | POA: Diagnosis not present

## 2023-07-30 DIAGNOSIS — E1142 Type 2 diabetes mellitus with diabetic polyneuropathy: Secondary | ICD-10-CM | POA: Diagnosis not present

## 2023-08-01 DIAGNOSIS — H903 Sensorineural hearing loss, bilateral: Secondary | ICD-10-CM | POA: Diagnosis not present

## 2023-08-03 DIAGNOSIS — M792 Neuralgia and neuritis, unspecified: Secondary | ICD-10-CM | POA: Diagnosis not present

## 2023-08-16 DIAGNOSIS — G894 Chronic pain syndrome: Secondary | ICD-10-CM | POA: Diagnosis not present

## 2023-08-16 DIAGNOSIS — D509 Iron deficiency anemia, unspecified: Secondary | ICD-10-CM | POA: Diagnosis not present

## 2023-08-17 ENCOUNTER — Telehealth: Payer: Self-pay | Admitting: Hematology

## 2023-08-20 ENCOUNTER — Other Ambulatory Visit: Payer: Self-pay | Admitting: Internal Medicine

## 2023-08-21 DIAGNOSIS — H903 Sensorineural hearing loss, bilateral: Secondary | ICD-10-CM | POA: Diagnosis not present

## 2023-08-22 ENCOUNTER — Telehealth: Payer: Self-pay | Admitting: Hematology

## 2023-08-22 NOTE — Telephone Encounter (Signed)
Per Dr Tenny Craw previous note: Need to get Vit D Level checked before reviewing this renewal Safe for now until it gets checked again to take 2000 per day   Pt advised and will try to get a Vit D level.. the next time she is in any office. Replacement sent to her pharmacy for the 2000 units daily.

## 2023-08-27 ENCOUNTER — Other Ambulatory Visit: Payer: Self-pay

## 2023-08-27 NOTE — Progress Notes (Deleted)
[facility] OFFICE PROGRESS NOTE  Anita Plum, MD  ASSESSMENT & PLAN:  No problem-specific Assessment & Plan notes found for this encounter.   No orders of the defined types were placed in this encounter.   The total time spent in the appointment was {CHL ONC TIME VISIT - EPPIR:[id number]} encounter with patients including review of chart and various tests results, discussions about plan of care and coordination of care plan   All questions were answered. The patient knows to call the clinic with any problems, questions or concerns. No barriers to learning was detected.    Anita Jews, NP 10/14/20244:25 PM  INTERVAL HISTORY: Anita Gonzalez 77 y.o. female returns for anemia due to chronic blood loss   Current therapy: Oral iron BID, folic acid, and oral B12.  IV Fereheme PRN. Last given 02/2021 B12  injections given PRN.  SUMMARY OF HEMATOLOGIC HISTORY: ***  I have reviewed the past medical history, past surgical history, social history and family history with the patient and they are unchanged from previous note.  ALLERGIES:  is allergic to fish allergy; peanuts [peanut oil]; penicillins; iodinated contrast media; iodine; percocet [oxycodone-acetaminophen]; 2,4-d dimethylamine; peanut (diagnostic); peanut allergen powder-dnfp; hydrocodone; other; oxycodone; and sulfa antibiotics.  MEDICATIONS:  Current Outpatient Medications  Medication Sig Dispense Refill   albuterol (PROVENTIL) (2.5 MG/3ML) 0.083% nebulizer solution USE 1 UNIT DOSE(VIAL)VIA NEBULIZER 4 TIMES A DAY AS NEEDED AS DIRECTED 1080 mL 3   albuterol (VENTOLIN HFA) 108 (90 Base) MCG/ACT inhaler INHALE 2 PUFFS BY MOUTH EVERY 6 HRS AS NEEDED 54 g 4   amLODipine (NORVASC) 5 MG tablet TAKE 1 TABLET BY MOUTH IN THE MORNING AND AT BEDTIME. Pt must keep her follow up for future refills. 60 tablet 0   aspirin EC 81 MG tablet Take 1 tablet (81 mg total) by mouth daily. 90 tablet 3   atorvastatin  (LIPITOR) 40 MG tablet ONE TABLET BY MOUTH DAILY 90 tablet 0   bumetanide (BUMEX) 2 MG tablet TAKE 1 TABLET BY MOUTH 2 TIMES A WEEK 24 tablet 3   Cholecalciferol (VITAMIN D3) 50 MCG (2000 UT) capsule Take 1 capsule (2,000 Units total) by mouth daily. 100 capsule 3   cyanocobalamin 1000 MCG tablet Take 1,000 mcg by mouth every Thursday.      cyclobenzaprine (FLEXERIL) 10 MG tablet TAKE ONE TABLET BY MOUTH AT BEDTIME 30 tablet 10   diclofenac Sodium (VOLTAREN) 1 % GEL APPLY 2 G TOPICALLY 4 TIMES A DAY 700 g 3   diphenoxylate-atropine (LOMOTIL) 2.5-0.025 MG tablet Take 1 tablet by mouth 4 (four) times daily as needed for diarrhea or loose stools.      docusate sodium (COLACE) 100 MG capsule Take 100 mg by mouth 2 (two) times daily as needed for mild constipation.     ferrous sulfate 325 (65 FE) MG tablet Take 325 mg by mouth 2 (two) times daily with a meal.      fluticasone (FLONASE) 50 MCG/ACT nasal spray Place 1 spray into both nostrils daily as needed for allergies or rhinitis.     fluticasone-salmeterol (ADVAIR) 500-50 MCG/ACT AEPB INHALE 1 PUFF BY MOUTH 2 TIMES A DAY 180 each 2   folic acid (FOLVITE) 1 MG tablet ONE TABLET BY MOUTH DAILY 90 tablet 0   gabapentin (NEURONTIN) 600 MG tablet Take 600 mg by mouth 3 (three) times daily.     losartan (COZAAR) 50 MG tablet TAKE 1 TABLET BY MOUTH DAILY 90 tablet 3  metFORMIN (GLUCOPHAGE) 500 MG tablet Take 500 mg by mouth 2 (two) times daily.     metFORMIN (GLUCOPHAGE-XR) 500 MG 24 hr tablet Take 500 mg by mouth 2 (two) times daily.     methocarbamol (ROBAXIN) 500 MG tablet Take 1 tablet (500 mg total) by mouth every 6 (six) hours as needed for muscle spasms. 40 tablet 0   montelukast (SINGULAIR) 10 MG tablet TAKE 1 TABLET BY MOUTH DAILY 90 tablet 3   naproxen (NAPROSYN) 375 MG tablet Take 1 tablet (375 mg total) by mouth 2 (two) times daily. 20 tablet 0   pantoprazole (PROTONIX) 40 MG tablet ONE TABLET BY MOUTH DAILY 90 tablet 2   SPIRIVA RESPIMAT 2.5  MCG/ACT AERS INHALE 2 PUFFS INTO THE LUNGS DAILY 12 g 0   Tiotropium Bromide Monohydrate (SPIRIVA RESPIMAT) 1.25 MCG/ACT AERS INHALE 2 PUFFS BY MOUTH ONCE A DAY 12 g 2   traZODone (DESYREL) 25 mg TABS tablet Take 25 mg by mouth at bedtime.     No current facility-administered medications for this visit.     REVIEW OF SYSTEMS:   Constitutional: Denies fevers, chills or night sweats Eyes: Denies blurriness of vision Ears, nose, mouth, throat, and face: Denies mucositis or sore throat Respiratory: Denies cough, dyspnea or wheezes Cardiovascular: Denies palpitation, chest discomfort or lower extremity swelling Gastrointestinal:  Denies nausea, heartburn or change in bowel habits Skin: Denies abnormal skin rashes Lymphatics: Denies new lymphadenopathy or easy bruising Neurological:Denies numbness, tingling or new weaknesses Behavioral/Psych: Mood is stable, no new changes  All other systems were reviewed with the patient and are negative.  PHYSICAL EXAMINATION: ECOG PERFORMANCE STATUS: {CHL ONC ECOG PS:[id number]}  There were no vitals filed for this visit. There were no vitals filed for this visit.  GENERAL:alert, no distress and comfortable SKIN: skin color, texture, turgor are normal, no rashes or significant lesions EYES: normal, Conjunctiva are pink and non-injected, sclera clear OROPHARYNX:no exudate, no erythema and lips, buccal mucosa, and tongue normal  NECK: supple, thyroid normal size, non-tender, without nodularity LYMPH:  no palpable lymphadenopathy in the cervical, axillary or inguinal LUNGS: clear to auscultation and percussion with normal breathing effort HEART: regular rate & rhythm and no murmurs and no lower extremity edema ABDOMEN:abdomen soft, non-tender and normal bowel sounds Musculoskeletal:no cyanosis of digits and no clubbing  NEURO: alert & oriented x 3 with fluent speech, no focal motor/sensory deficits  LABORATORY DATA:  I have reviewed the data as  listed     Component Value Date/Time   NA 143 03/21/2023 1257   NA 143 07/26/2020 1328   NA 142 02/25/2015 1402   K 3.8 03/21/2023 1257   K 4.0 02/25/2015 1402   CL 105 03/21/2023 1257   CO2 31 03/21/2023 1257   CO2 30 (H) 02/25/2015 1402   GLUCOSE 116 (H) 03/21/2023 1257   GLUCOSE 89 02/25/2015 1402   BUN 18 03/21/2023 1257   BUN 18 07/26/2020 1328   BUN 11.2 02/25/2015 1402   CREATININE 1.34 (H) 03/21/2023 1257   CREATININE 1.0 02/25/2015 1402   CALCIUM 9.2 03/21/2023 1257   CALCIUM 8.8 02/25/2015 1402   PROT 7.2 03/21/2023 1257   PROT 6.8 02/25/2015 1402   ALBUMIN 4.2 03/21/2023 1257   ALBUMIN 3.7 02/25/2015 1402   AST 15 03/21/2023 1257   AST 24 02/25/2015 1402   ALT 14 03/21/2023 1257   ALT 23 02/25/2015 1402   ALKPHOS 93 03/21/2023 1257   ALKPHOS 90 02/25/2015 1402   BILITOT 0.2 (L)  03/21/2023 1257   BILITOT 0.32 02/25/2015 1402   GFRNONAA 41 (L) 03/21/2023 1257   GFRAA 36 (L) 07/26/2020 1328   GFRAA 40 (L) 05/21/2020 1444    No results found for: "SPEP", "UPEP"  Lab Results  Component Value Date   WBC 6.1 03/21/2023   NEUTROABS 3.8 03/21/2023   HGB 9.9 (L) 03/21/2023   HCT 31.5 (L) 03/21/2023   HCT 31.5 (L) 03/21/2023   MCV 94.3 03/21/2023   PLT 307 03/21/2023      Chemistry      Component Value Date/Time   NA 143 03/21/2023 1257   NA 143 07/26/2020 1328   NA 142 02/25/2015 1402   K 3.8 03/21/2023 1257   K 4.0 02/25/2015 1402   CL 105 03/21/2023 1257   CO2 31 03/21/2023 1257   CO2 30 (H) 02/25/2015 1402   BUN 18 03/21/2023 1257   BUN 18 07/26/2020 1328   BUN 11.2 02/25/2015 1402   CREATININE 1.34 (H) 03/21/2023 1257   CREATININE 1.0 02/25/2015 1402      Component Value Date/Time   CALCIUM 9.2 03/21/2023 1257   CALCIUM 8.8 02/25/2015 1402   ALKPHOS 93 03/21/2023 1257   ALKPHOS 90 02/25/2015 1402   AST 15 03/21/2023 1257   AST 24 02/25/2015 1402   ALT 14 03/21/2023 1257   ALT 23 02/25/2015 1402   BILITOT 0.2 (L) 03/21/2023 1257    BILITOT 0.32 02/25/2015 1402       RADIOGRAPHIC STUDIES: I have personally reviewed the radiological images as listed and agreed with the findings in the report. No results found.

## 2023-08-28 ENCOUNTER — Ambulatory Visit: Payer: Medicare HMO | Admitting: Nurse Practitioner

## 2023-08-28 ENCOUNTER — Telehealth: Payer: Self-pay | Admitting: Hematology

## 2023-08-28 ENCOUNTER — Other Ambulatory Visit: Payer: Medicare HMO

## 2023-08-31 DIAGNOSIS — M1711 Unilateral primary osteoarthritis, right knee: Secondary | ICD-10-CM | POA: Diagnosis not present

## 2023-08-31 DIAGNOSIS — M170 Bilateral primary osteoarthritis of knee: Secondary | ICD-10-CM | POA: Diagnosis not present

## 2023-08-31 DIAGNOSIS — M25562 Pain in left knee: Secondary | ICD-10-CM | POA: Diagnosis not present

## 2023-08-31 DIAGNOSIS — M1712 Unilateral primary osteoarthritis, left knee: Secondary | ICD-10-CM | POA: Diagnosis not present

## 2023-08-31 DIAGNOSIS — M25561 Pain in right knee: Secondary | ICD-10-CM | POA: Diagnosis not present

## 2023-08-31 NOTE — Assessment & Plan Note (Deleted)
from GI AVM bleeding  -She has moderate anemia with baseline hemoglobin in 8-9 range, MCV normal, ferritin 16, serum iron 13, URBC elevated at 430, iron saturation 3%, this is consistent with iron deficient anemia -Anemia has been present for several years, likely secondary to slow GI bleeding. Her stool OB was positive. Colonoscopy showed AVM, last done in 03/2015. Her 03/15/18 Upper Endoscopy with Dr Elnoria Howard showed no evidence of bleeding.  -Her baseline SPEP and UPEP were negative.  -She also may have a component of anemia of chronic disease secondary to kidney disease and rheumatoid arthritis -She has received IV Feraheme as needed since 2016, most recently 02/2021. She responded well and continues oral iron BID.  -She continues to have progressively worse fatigue. She denies GI bleeding or overt blood loss. Labs reviewed, Hg 9. CMP and iron panel still pending. If her iron is low, I will give IV Ferahame in the near future. -Continue oral B12, Folic acid, iron ferrous sulfate BID daily. Will set up iv feraheme if ferritin<100  -Given symptoms, will monitor with labs and f/u every 2-3 months

## 2023-08-31 NOTE — Progress Notes (Deleted)
[facility] OFFICE PROGRESS NOTE  Osei-Bonsu, Greggory Stallion, MD  ASSESSMENT & PLAN:  Anemia, iron deficiency from GI AVM bleeding  -She has moderate anemia with baseline hemoglobin in 8-9 range, MCV normal, ferritin 16, serum iron 13, URBC elevated at 430, iron saturation 3%, this is consistent with iron deficient anemia -Anemia has been present for several years, likely secondary to slow GI bleeding. Her stool OB was positive. Colonoscopy showed AVM, last done in 03/2015. Her 03/15/18 Upper Endoscopy with Dr Elnoria Howard showed no evidence of bleeding.  -Her baseline SPEP and UPEP were negative.  -She also may have a component of anemia of chronic disease secondary to kidney disease and rheumatoid arthritis -She has received IV Feraheme as needed since 2016, most recently 02/2021. She responded well and continues oral iron BID.  -She continues to have progressively worse fatigue. She denies GI bleeding or overt blood loss. Labs reviewed, Hg 9. CMP and iron panel still pending. If her iron is low, I will give IV Ferahame in the near future. -Continue oral B12, Folic acid, iron ferrous sulfate BID daily. Will set up iv feraheme if ferritin<100  -Given symptoms, will monitor with labs and f/u every 2-3 months    No orders of the defined types were placed in this encounter.   The total time spent in the appointment was {CHL ONC TIME VISIT - MWUXL:[id number]} encounter with patients including review of chart and various tests results, discussions about plan of care and coordination of care plan   All questions were answered. The patient knows to call the clinic with any problems, questions or concerns. No barriers to learning was detected.    [name], NP 10/18/20242:35 PM  INTERVAL HISTORY: Anita Gonzalez 77 y.o. female returns for anemia due to chronic blood loss   Current therapy: Oral iron BID, folic acid, and oral B12.  IV Fereheme PRN. Last given 02/2021 B12  injections  given PRN.  SUMMARY OF HEMATOLOGIC HISTORY: -She has moderate anemia with baseline hemoglobin in 8-9 range, MCV normal, ferritin 16, serum iron 13, URBC elevated at 430, iron saturation 3%, this is consistent with iron deficient anemia -Anemia has been present for several years, likely secondary to slow GI bleeding. Her stool OB was positive. Colonoscopy showed AVM, last done in 03/2015. Her 03/15/18 Upper Endoscopy with Dr Elnoria Howard showed no evidence of bleeding.  -Her baseline SPEP and UPEP were negative.  -She also may have a component of anemia of chronic disease secondary to kidney disease and rheumatoid arthritis -She has received IV Feraheme as needed since 2016, most recently 02/2021. She responded well and continues oral iron BID.  -She continues to have progressively worse fatigue. She denies GI bleeding or overt blood loss. Labs reviewed, Hg 9. CMP and iron panel still pending. If her iron is low, I will give IV Ferahame in the near future. -Continue oral B12, Folic acid, iron ferrous sulfate BID daily. Will set up iv feraheme if ferritin<100  -Given symptoms, will monitor with labs and f/u every 2-3 months   I have reviewed the past medical history, past surgical history, social history and family history with the patient and they are unchanged from previous note.  ALLERGIES:  is allergic to fish allergy; peanuts [peanut oil]; penicillins; iodinated contrast media; iodine; percocet [oxycodone-acetaminophen]; 2,4-d dimethylamine; peanut (diagnostic); peanut allergen powder-dnfp; hydrocodone; other; oxycodone; and sulfa antibiotics.  MEDICATIONS:  Current Outpatient Medications  Medication Sig Dispense Refill   albuterol (PROVENTIL) (2.5 MG/3ML)  0.083% nebulizer solution USE 1 UNIT DOSE(VIAL)VIA NEBULIZER 4 TIMES A DAY AS NEEDED AS DIRECTED 1080 mL 3   albuterol (VENTOLIN HFA) 108 (90 Base) MCG/ACT inhaler INHALE 2 PUFFS BY MOUTH EVERY 6 HRS AS NEEDED 54 g 4   amLODipine (NORVASC) 5 MG tablet  TAKE 1 TABLET BY MOUTH IN THE MORNING AND AT BEDTIME. Pt must keep her follow up for future refills. 60 tablet 0   aspirin EC 81 MG tablet Take 1 tablet (81 mg total) by mouth daily. 90 tablet 3   atorvastatin (LIPITOR) 40 MG tablet ONE TABLET BY MOUTH DAILY 90 tablet 0   bumetanide (BUMEX) 2 MG tablet TAKE 1 TABLET BY MOUTH 2 TIMES A WEEK 24 tablet 3   Cholecalciferol (VITAMIN D3) 50 MCG (2000 UT) capsule Take 1 capsule (2,000 Units total) by mouth daily. 100 capsule 3   cyanocobalamin 1000 MCG tablet Take 1,000 mcg by mouth every Thursday.      cyclobenzaprine (FLEXERIL) 10 MG tablet TAKE ONE TABLET BY MOUTH AT BEDTIME 30 tablet 10   diclofenac Sodium (VOLTAREN) 1 % GEL APPLY 2 G TOPICALLY 4 TIMES A DAY 700 g 3   diphenoxylate-atropine (LOMOTIL) 2.5-0.025 MG tablet Take 1 tablet by mouth 4 (four) times daily as needed for diarrhea or loose stools.      docusate sodium (COLACE) 100 MG capsule Take 100 mg by mouth 2 (two) times daily as needed for mild constipation.     ferrous sulfate 325 (65 FE) MG tablet Take 325 mg by mouth 2 (two) times daily with a meal.      fluticasone (FLONASE) 50 MCG/ACT nasal spray Place 1 spray into both nostrils daily as needed for allergies or rhinitis.     fluticasone-salmeterol (ADVAIR) 500-50 MCG/ACT AEPB INHALE 1 PUFF BY MOUTH 2 TIMES A DAY 180 each 2   folic acid (FOLVITE) 1 MG tablet ONE TABLET BY MOUTH DAILY 90 tablet 0   gabapentin (NEURONTIN) 600 MG tablet Take 600 mg by mouth 3 (three) times daily.     losartan (COZAAR) 50 MG tablet TAKE 1 TABLET BY MOUTH DAILY 90 tablet 3   metFORMIN (GLUCOPHAGE) 500 MG tablet Take 500 mg by mouth 2 (two) times daily.     metFORMIN (GLUCOPHAGE-XR) 500 MG 24 hr tablet Take 500 mg by mouth 2 (two) times daily.     methocarbamol (ROBAXIN) 500 MG tablet Take 1 tablet (500 mg total) by mouth every 6 (six) hours as needed for muscle spasms. 40 tablet 0   montelukast (SINGULAIR) 10 MG tablet TAKE 1 TABLET BY MOUTH DAILY 90 tablet  3   naproxen (NAPROSYN) 375 MG tablet Take 1 tablet (375 mg total) by mouth 2 (two) times daily. 20 tablet 0   pantoprazole (PROTONIX) 40 MG tablet ONE TABLET BY MOUTH DAILY 90 tablet 2   SPIRIVA RESPIMAT 2.5 MCG/ACT AERS INHALE 2 PUFFS INTO THE LUNGS DAILY 12 g 0   Tiotropium Bromide Monohydrate (SPIRIVA RESPIMAT) 1.25 MCG/ACT AERS INHALE 2 PUFFS BY MOUTH ONCE A DAY 12 g 2   traZODone (DESYREL) 25 mg TABS tablet Take 25 mg by mouth at bedtime.     No current facility-administered medications for this visit.     REVIEW OF SYSTEMS:   Constitutional: Denies fevers, chills or night sweats Eyes: Denies blurriness of vision Ears, nose, mouth, throat, and face: Denies mucositis or sore throat Respiratory: Denies cough, dyspnea or wheezes Cardiovascular: Denies palpitation, chest discomfort or lower extremity swelling Gastrointestinal:  Denies nausea,  heartburn or change in bowel habits Skin: Denies abnormal skin rashes Lymphatics: Denies new lymphadenopathy or easy bruising Neurological:Denies numbness, tingling or new weaknesses Behavioral/Psych: Mood is stable, no new changes  All other systems were reviewed with the patient and are negative.  PHYSICAL EXAMINATION: ECOG PERFORMANCE STATUS: {CHL ONC ECOG PS:[id number]}  There were no vitals filed for this visit. There were no vitals filed for this visit.  GENERAL:alert, no distress and comfortable SKIN: skin color, texture, turgor are normal, no rashes or significant lesions EYES: normal, Conjunctiva are pink and non-injected, sclera clear OROPHARYNX:no exudate, no erythema and lips, buccal mucosa, and tongue normal  NECK: supple, thyroid normal size, non-tender, without nodularity LYMPH:  no palpable lymphadenopathy in the cervical, axillary or inguinal LUNGS: clear to auscultation and percussion with normal breathing effort HEART: regular rate & rhythm and no murmurs and no lower extremity edema ABDOMEN:abdomen soft, non-tender and  normal bowel sounds Musculoskeletal:no cyanosis of digits and no clubbing  NEURO: alert & oriented x 3 with fluent speech, no focal motor/sensory deficits  LABORATORY DATA:  I have reviewed the data as listed     Component Value Date/Time   NA 143 03/21/2023 1257   NA 143 07/26/2020 1328   NA 142 02/25/2015 1402   K 3.8 03/21/2023 1257   K 4.0 02/25/2015 1402   CL 105 03/21/2023 1257   CO2 31 03/21/2023 1257   CO2 30 (H) 02/25/2015 1402   GLUCOSE 116 (H) 03/21/2023 1257   GLUCOSE 89 02/25/2015 1402   BUN 18 03/21/2023 1257   BUN 18 07/26/2020 1328   BUN 11.2 02/25/2015 1402   CREATININE 1.34 (H) 03/21/2023 1257   CREATININE 1.0 02/25/2015 1402   CALCIUM 9.2 03/21/2023 1257   CALCIUM 8.8 02/25/2015 1402   PROT 7.2 03/21/2023 1257   PROT 6.8 02/25/2015 1402   ALBUMIN 4.2 03/21/2023 1257   ALBUMIN 3.7 02/25/2015 1402   AST 15 03/21/2023 1257   AST 24 02/25/2015 1402   ALT 14 03/21/2023 1257   ALT 23 02/25/2015 1402   ALKPHOS 93 03/21/2023 1257   ALKPHOS 90 02/25/2015 1402   BILITOT 0.2 (L) 03/21/2023 1257   BILITOT 0.32 02/25/2015 1402   GFRNONAA 41 (L) 03/21/2023 1257   GFRAA 36 (L) 07/26/2020 1328   GFRAA 40 (L) 05/21/2020 1444    No results found for: "SPEP", "UPEP"  Lab Results  Component Value Date   WBC 6.1 03/21/2023   NEUTROABS 3.8 03/21/2023   HGB 9.9 (L) 03/21/2023   HCT 31.5 (L) 03/21/2023   HCT 31.5 (L) 03/21/2023   MCV 94.3 03/21/2023   PLT 307 03/21/2023      Chemistry      Component Value Date/Time   NA 143 03/21/2023 1257   NA 143 07/26/2020 1328   NA 142 02/25/2015 1402   K 3.8 03/21/2023 1257   K 4.0 02/25/2015 1402   CL 105 03/21/2023 1257   CO2 31 03/21/2023 1257   CO2 30 (H) 02/25/2015 1402   BUN 18 03/21/2023 1257   BUN 18 07/26/2020 1328   BUN 11.2 02/25/2015 1402   CREATININE 1.34 (H) 03/21/2023 1257   CREATININE 1.0 02/25/2015 1402      Component Value Date/Time   CALCIUM 9.2 03/21/2023 1257   CALCIUM 8.8 02/25/2015 1402    ALKPHOS 93 03/21/2023 1257   ALKPHOS 90 02/25/2015 1402   AST 15 03/21/2023 1257   AST 24 02/25/2015 1402   ALT 14 03/21/2023 1257   ALT 23  02/25/2015 1402   BILITOT 0.2 (L) 03/21/2023 1257   BILITOT 0.32 02/25/2015 1402       RADIOGRAPHIC STUDIES: I have personally reviewed the radiological images as listed and agreed with the findings in the report. No results found.

## 2023-09-03 ENCOUNTER — Telehealth: Payer: Self-pay | Admitting: Nurse Practitioner

## 2023-09-03 ENCOUNTER — Inpatient Hospital Stay: Payer: Medicare HMO | Admitting: Nurse Practitioner

## 2023-09-03 ENCOUNTER — Inpatient Hospital Stay: Payer: Medicare HMO

## 2023-09-03 DIAGNOSIS — D500 Iron deficiency anemia secondary to blood loss (chronic): Secondary | ICD-10-CM

## 2023-09-11 ENCOUNTER — Ambulatory Visit: Payer: Medicare HMO | Admitting: Nurse Practitioner

## 2023-09-11 ENCOUNTER — Inpatient Hospital Stay: Payer: Medicare HMO

## 2023-09-11 ENCOUNTER — Telehealth: Payer: Self-pay | Admitting: Hematology

## 2023-09-11 NOTE — Progress Notes (Deleted)
[facility] OFFICE PROGRESS NOTE  [name], MD  ASSESSMENT & PLAN:  No problem-specific Assessment & Plan notes found for this encounter.    No orders of the defined types were placed in this encounter.   The total time spent in the appointment was {CHL ONC TIME VISIT - JYNWG:[id number]} encounter with patients including review of chart and various tests results, discussions about plan of care and coordination of care plan   All questions were answered. The patient knows to call the clinic with any problems, questions or concerns. No barriers to learning was detected.    [name], NP 10/29/20247:32 AM  INTERVAL HISTORY: Anita Gonzalez 77 y.o. female returns for anemia due to chronic blood loss   Current therapy: Oral iron BID, folic acid, and oral B12.  IV Fereheme PRN. Last given 02/2021 B12  injections given PRN.  SUMMARY OF HEMATOLOGIC HISTORY: -She has moderate anemia with baseline hemoglobin in 8-9 range, MCV normal, ferritin 16, serum iron 13, URBC elevated at 430, iron saturation 3%, this is consistent with iron deficient anemia -Anemia has been present for several years, likely secondary to slow GI bleeding. Her stool OB was positive. Colonoscopy showed AVM, last done in 03/2015. Her 03/15/18 Upper Endoscopy with Dr Elnoria Howard showed no evidence of bleeding.  -Her baseline SPEP and UPEP were negative.  -She also may have a component of anemia of chronic disease secondary to kidney disease and rheumatoid arthritis -She has received IV Feraheme as needed since 2016, most recently 02/2021. She responded well and continues oral iron BID.  -She continues to have progressively worse fatigue. She denies GI bleeding or overt blood loss. Labs reviewed, Hg 9. CMP and iron panel still pending. If her iron is low, I will give IV Ferahame in the near future. -Continue oral B12, Folic acid, iron ferrous sulfate BID daily. Will set up iv feraheme if ferritin<100   -Given symptoms, will monitor with labs and f/u every 2-3 months   I have reviewed the past medical history, past surgical history, social history and family history with the patient and they are unchanged from previous note.  ALLERGIES:  is allergic to fish allergy; peanuts [peanut oil]; penicillins; iodinated contrast media; iodine; percocet [oxycodone-acetaminophen]; 2,4-d dimethylamine; peanut (diagnostic); peanut allergen powder-dnfp; hydrocodone; other; oxycodone; and sulfa antibiotics.  MEDICATIONS:  Current Outpatient Medications  Medication Sig Dispense Refill   albuterol (PROVENTIL) (2.5 MG/3ML) 0.083% nebulizer solution USE 1 UNIT DOSE(VIAL)VIA NEBULIZER 4 TIMES A DAY AS NEEDED AS DIRECTED 1080 mL 3   albuterol (VENTOLIN HFA) 108 (90 Base) MCG/ACT inhaler INHALE 2 PUFFS BY MOUTH EVERY 6 HRS AS NEEDED 54 g 4   amLODipine (NORVASC) 5 MG tablet TAKE 1 TABLET BY MOUTH IN THE MORNING AND AT BEDTIME. Pt must keep her follow up for future refills. 60 tablet 0   aspirin EC 81 MG tablet Take 1 tablet (81 mg total) by mouth daily. 90 tablet 3   atorvastatin (LIPITOR) 40 MG tablet ONE TABLET BY MOUTH DAILY 90 tablet 0   bumetanide (BUMEX) 2 MG tablet TAKE 1 TABLET BY MOUTH 2 TIMES A WEEK 24 tablet 3   Cholecalciferol (VITAMIN D3) 50 MCG (2000 UT) capsule Take 1 capsule (2,000 Units total) by mouth daily. 100 capsule 3   cyanocobalamin 1000 MCG tablet Take 1,000 mcg by mouth every Thursday.      cyclobenzaprine (FLEXERIL) 10 MG tablet TAKE ONE TABLET BY MOUTH AT BEDTIME 30 tablet 10  diclofenac Sodium (VOLTAREN) 1 % GEL APPLY 2 G TOPICALLY 4 TIMES A DAY 700 g 3   diphenoxylate-atropine (LOMOTIL) 2.5-0.025 MG tablet Take 1 tablet by mouth 4 (four) times daily as needed for diarrhea or loose stools.      docusate sodium (COLACE) 100 MG capsule Take 100 mg by mouth 2 (two) times daily as needed for mild constipation.     ferrous sulfate 325 (65 FE) MG tablet Take 325 mg by mouth 2 (two) times  daily with a meal.      fluticasone (FLONASE) 50 MCG/ACT nasal spray Place 1 spray into both nostrils daily as needed for allergies or rhinitis.     fluticasone-salmeterol (ADVAIR) 500-50 MCG/ACT AEPB INHALE 1 PUFF BY MOUTH 2 TIMES A DAY 180 each 2   folic acid (FOLVITE) 1 MG tablet ONE TABLET BY MOUTH DAILY 90 tablet 0   gabapentin (NEURONTIN) 600 MG tablet Take 600 mg by mouth 3 (three) times daily.     losartan (COZAAR) 50 MG tablet TAKE 1 TABLET BY MOUTH DAILY 90 tablet 3   metFORMIN (GLUCOPHAGE) 500 MG tablet Take 500 mg by mouth 2 (two) times daily.     metFORMIN (GLUCOPHAGE-XR) 500 MG 24 hr tablet Take 500 mg by mouth 2 (two) times daily.     methocarbamol (ROBAXIN) 500 MG tablet Take 1 tablet (500 mg total) by mouth every 6 (six) hours as needed for muscle spasms. 40 tablet 0   montelukast (SINGULAIR) 10 MG tablet TAKE 1 TABLET BY MOUTH DAILY 90 tablet 3   naproxen (NAPROSYN) 375 MG tablet Take 1 tablet (375 mg total) by mouth 2 (two) times daily. 20 tablet 0   pantoprazole (PROTONIX) 40 MG tablet ONE TABLET BY MOUTH DAILY 90 tablet 2   SPIRIVA RESPIMAT 2.5 MCG/ACT AERS INHALE 2 PUFFS INTO THE LUNGS DAILY 12 g 0   Tiotropium Bromide Monohydrate (SPIRIVA RESPIMAT) 1.25 MCG/ACT AERS INHALE 2 PUFFS BY MOUTH ONCE A DAY 12 g 2   traZODone (DESYREL) 25 mg TABS tablet Take 25 mg by mouth at bedtime.     No current facility-administered medications for this visit.     REVIEW OF SYSTEMS:   Constitutional: Denies fevers, chills or night sweats Eyes: Denies blurriness of vision Ears, nose, mouth, throat, and face: Denies mucositis or sore throat Respiratory: Denies cough, dyspnea or wheezes Cardiovascular: Denies palpitation, chest discomfort or lower extremity swelling Gastrointestinal:  Denies nausea, heartburn or change in bowel habits Skin: Denies abnormal skin rashes Lymphatics: Denies new lymphadenopathy or easy bruising Neurological:Denies numbness, tingling or new  weaknesses Behavioral/Psych: Mood is stable, no new changes  All other systems were reviewed with the patient and are negative.  PHYSICAL EXAMINATION: ECOG PERFORMANCE STATUS: {CHL ONC ECOG [phone]}  There were no vitals filed for this visit. There were no vitals filed for this visit.  GENERAL:alert, no distress and comfortable SKIN: skin color, texture, turgor are normal, no rashes or significant lesions EYES: normal, Conjunctiva are pink and non-injected, sclera clear OROPHARYNX:no exudate, no erythema and lips, buccal mucosa, and tongue normal  NECK: supple, thyroid normal size, non-tender, without nodularity LYMPH:  no palpable lymphadenopathy in the cervical, axillary or inguinal LUNGS: clear to auscultation and percussion with normal breathing effort HEART: regular rate & rhythm and no murmurs and no lower extremity edema ABDOMEN:abdomen soft, non-tender and normal bowel sounds Musculoskeletal:no cyanosis of digits and no clubbing  NEURO: alert & oriented x 3 with fluent speech, no focal motor/sensory deficits  LABORATORY DATA:  I have reviewed the data as listed     Component Value Date/Time   NA 143 03/21/2023 1257   NA 143 07/26/2020 1328   NA 142 02/25/2015 1402   K 3.8 03/21/2023 1257   K 4.0 02/25/2015 1402   CL 105 03/21/2023 1257   CO2 31 03/21/2023 1257   CO2 30 (H) 02/25/2015 1402   GLUCOSE 116 (H) 03/21/2023 1257   GLUCOSE 89 02/25/2015 1402   BUN 18 03/21/2023 1257   BUN 18 07/26/2020 1328   BUN 11.2 02/25/2015 1402   CREATININE 1.34 (H) 03/21/2023 1257   CREATININE 1.0 02/25/2015 1402   CALCIUM 9.2 03/21/2023 1257   CALCIUM 8.8 02/25/2015 1402   PROT 7.2 03/21/2023 1257   PROT 6.8 02/25/2015 1402   ALBUMIN 4.2 03/21/2023 1257   ALBUMIN 3.7 02/25/2015 1402   AST 15 03/21/2023 1257   AST 24 02/25/2015 1402   ALT 14 03/21/2023 1257   ALT 23 02/25/2015 1402   ALKPHOS 93 03/21/2023 1257   ALKPHOS 90 02/25/2015 1402   BILITOT 0.2 (L) 03/21/2023  1257   BILITOT 0.32 02/25/2015 1402   GFRNONAA 41 (L) 03/21/2023 1257   GFRAA 36 (L) 07/26/2020 1328   GFRAA 40 (L) 05/21/2020 1444    No results found for: "SPEP", "UPEP"  Lab Results  Component Value Date   WBC 6.1 03/21/2023   NEUTROABS 3.8 03/21/2023   HGB 9.9 (L) 03/21/2023   HCT 31.5 (L) 03/21/2023   HCT 31.5 (L) 03/21/2023   MCV 94.3 03/21/2023   PLT 307 03/21/2023      Chemistry      Component Value Date/Time   NA 143 03/21/2023 1257   NA 143 07/26/2020 1328   NA 142 02/25/2015 1402   K 3.8 03/21/2023 1257   K 4.0 02/25/2015 1402   CL 105 03/21/2023 1257   CO2 31 03/21/2023 1257   CO2 30 (H) 02/25/2015 1402   BUN 18 03/21/2023 1257   BUN 18 07/26/2020 1328   BUN 11.2 02/25/2015 1402   CREATININE 1.34 (H) 03/21/2023 1257   CREATININE 1.0 02/25/2015 1402      Component Value Date/Time   CALCIUM 9.2 03/21/2023 1257   CALCIUM 8.8 02/25/2015 1402   ALKPHOS 93 03/21/2023 1257   ALKPHOS 90 02/25/2015 1402   AST 15 03/21/2023 1257   AST 24 02/25/2015 1402   ALT 14 03/21/2023 1257   ALT 23 02/25/2015 1402   BILITOT 0.2 (L) 03/21/2023 1257   BILITOT 0.32 02/25/2015 1402       RADIOGRAPHIC STUDIES: I have personally reviewed the radiological images as listed and agreed with the findings in the report. No results found.

## 2023-09-13 DIAGNOSIS — E782 Mixed hyperlipidemia: Secondary | ICD-10-CM | POA: Diagnosis not present

## 2023-09-13 DIAGNOSIS — I119 Hypertensive heart disease without heart failure: Secondary | ICD-10-CM | POA: Diagnosis not present

## 2023-09-13 DIAGNOSIS — J449 Chronic obstructive pulmonary disease, unspecified: Secondary | ICD-10-CM | POA: Diagnosis not present

## 2023-09-18 DIAGNOSIS — G894 Chronic pain syndrome: Secondary | ICD-10-CM | POA: Diagnosis not present

## 2023-09-18 DIAGNOSIS — E538 Deficiency of other specified B group vitamins: Secondary | ICD-10-CM | POA: Diagnosis not present

## 2023-09-18 DIAGNOSIS — J449 Chronic obstructive pulmonary disease, unspecified: Secondary | ICD-10-CM | POA: Diagnosis not present

## 2023-09-18 DIAGNOSIS — E1129 Type 2 diabetes mellitus with other diabetic kidney complication: Secondary | ICD-10-CM | POA: Diagnosis not present

## 2023-09-18 DIAGNOSIS — E782 Mixed hyperlipidemia: Secondary | ICD-10-CM | POA: Diagnosis not present

## 2023-09-18 DIAGNOSIS — Z23 Encounter for immunization: Secondary | ICD-10-CM | POA: Diagnosis not present

## 2023-09-18 DIAGNOSIS — I119 Hypertensive heart disease without heart failure: Secondary | ICD-10-CM | POA: Diagnosis not present

## 2023-09-20 ENCOUNTER — Other Ambulatory Visit: Payer: Self-pay

## 2023-09-20 ENCOUNTER — Inpatient Hospital Stay: Payer: Medicare HMO | Attending: Hematology | Admitting: Hematology

## 2023-09-20 ENCOUNTER — Inpatient Hospital Stay: Payer: Medicare HMO

## 2023-09-20 ENCOUNTER — Other Ambulatory Visit: Payer: Medicare HMO

## 2023-09-20 VITALS — BP 145/53 | HR 95 | Temp 97.7°F | Resp 18 | Wt 210.7 lb

## 2023-09-20 DIAGNOSIS — K5521 Angiodysplasia of colon with hemorrhage: Secondary | ICD-10-CM | POA: Insufficient documentation

## 2023-09-20 DIAGNOSIS — D500 Iron deficiency anemia secondary to blood loss (chronic): Secondary | ICD-10-CM

## 2023-09-20 DIAGNOSIS — J479 Bronchiectasis, uncomplicated: Secondary | ICD-10-CM | POA: Diagnosis not present

## 2023-09-20 DIAGNOSIS — E611 Iron deficiency: Secondary | ICD-10-CM

## 2023-09-20 DIAGNOSIS — M069 Rheumatoid arthritis, unspecified: Secondary | ICD-10-CM | POA: Diagnosis not present

## 2023-09-20 LAB — CBC WITH DIFFERENTIAL (CANCER CENTER ONLY)
Abs Immature Granulocytes: 0.01 10*3/uL (ref 0.00–0.07)
Basophils Absolute: 0 10*3/uL (ref 0.0–0.1)
Basophils Relative: 0 %
Eosinophils Absolute: 0.2 10*3/uL (ref 0.0–0.5)
Eosinophils Relative: 5 %
HCT: 27.4 % — ABNORMAL LOW (ref 36.0–46.0)
Hemoglobin: 8.4 g/dL — ABNORMAL LOW (ref 12.0–15.0)
Immature Granulocytes: 0 %
Lymphocytes Relative: 27 %
Lymphs Abs: 1.3 10*3/uL (ref 0.7–4.0)
MCH: 28.5 pg (ref 26.0–34.0)
MCHC: 30.7 g/dL (ref 30.0–36.0)
MCV: 92.9 fL (ref 80.0–100.0)
Monocytes Absolute: 0.4 10*3/uL (ref 0.1–1.0)
Monocytes Relative: 8 %
Neutro Abs: 2.9 10*3/uL (ref 1.7–7.7)
Neutrophils Relative %: 60 %
Platelet Count: 293 10*3/uL (ref 150–400)
RBC: 2.95 MIL/uL — ABNORMAL LOW (ref 3.87–5.11)
RDW: 15.6 % — ABNORMAL HIGH (ref 11.5–15.5)
WBC Count: 4.8 10*3/uL (ref 4.0–10.5)
nRBC: 0 % (ref 0.0–0.2)

## 2023-09-20 LAB — CMP (CANCER CENTER ONLY)
ALT: 10 U/L (ref 0–44)
AST: 14 U/L — ABNORMAL LOW (ref 15–41)
Albumin: 4 g/dL (ref 3.5–5.0)
Alkaline Phosphatase: 91 U/L (ref 38–126)
Anion gap: 5 (ref 5–15)
BUN: 21 mg/dL (ref 8–23)
CO2: 33 mmol/L — ABNORMAL HIGH (ref 22–32)
Calcium: 9.7 mg/dL (ref 8.9–10.3)
Chloride: 103 mmol/L (ref 98–111)
Creatinine: 1.36 mg/dL — ABNORMAL HIGH (ref 0.44–1.00)
GFR, Estimated: 41 mL/min — ABNORMAL LOW (ref >60)
Glucose, Bld: 104 mg/dL — ABNORMAL HIGH (ref 70–99)
Potassium: 4.5 mmol/L (ref 3.5–5.1)
Sodium: 141 mmol/L (ref 135–145)
Total Bilirubin: 0.2 mg/dL (ref <1.2)
Total Protein: 6.9 g/dL (ref 6.5–8.1)

## 2023-09-20 LAB — VITAMIN B12: Vitamin B-12: 630 pg/mL (ref 180–914)

## 2023-09-20 LAB — IRON AND IRON BINDING CAPACITY (CC-WL,HP ONLY)
Iron: 27 ug/dL — ABNORMAL LOW (ref 28–170)
Saturation Ratios: 7 % — ABNORMAL LOW (ref 10.4–31.8)
TIBC: 381 ug/dL (ref 250–450)
UIBC: 354 ug/dL (ref 148–442)

## 2023-09-20 LAB — FERRITIN: Ferritin: 8 ng/mL — ABNORMAL LOW (ref 11–307)

## 2023-09-20 MED ORDER — ACCRUFER 30 MG PO CAPS: 30.0000 mg | ORAL_CAPSULE | Freq: Every day | ORAL | 5 refills | Status: DC

## 2023-09-20 MED ORDER — CYANOCOBALAMIN 1000 MCG PO TABS: 1000.0000 ug | ORAL_TABLET | ORAL | 5 refills | Status: DC

## 2023-09-20 NOTE — Progress Notes (Signed)
[facility]   [phone] [phone]   Clinic Follow up Note   Patient Care Team: [name], MD as PCP - General (Internal Medicine) Pricilla Riffle, MD as PCP - Cardiology (Cardiology) Jearld Lesch, MD as Referring Physician (Specialist) Jearld Lesch, MD as Referring Physician (Specialist) Jeani Hawking, MD as Consulting Physician (Gastroenterology) Clyde Lundborg., MD as Referring Physician (Anesthesiology) Larey Dresser, DPM as Consulting Physician (Podiatry) Malachy Mood, MD as Attending Physician (Hematology and Oncology)  Date of Service:  09/20/2023  CHIEF COMPLAINT: f/u of anemia  CURRENT THERAPY:  Oral iron, B12 and folic acid   Oncology History   Anemia, iron deficiency from GI AVM bleeding  -She has moderate anemia with baseline hemoglobin in 8-9 range, MCV normal, ferritin 16, serum iron 13, URBC elevated at 430, iron saturation 3%, this is consistent with iron deficient anemia -Anemia has been several years, likely secondary to slow GI bleeding. Her stool OB was positive. Colonoscopy showed AVM, last done in 03/2015. Her 03/15/18 Upper Endoscopy with Dr Elnoria Howard showed no evidence of bleeding.  -Her baseline SPEP and UPEP were negative.  -She also may have a component of anemia of chronic disease secondary to kidney disease and rheumatoid arthritis -She has received IV Feraheme as needed since 2016, most recently 02/2021. She responded well and continues oral iron BID.  -She continues to have progressively worse fatigue. She denies GI bleeding or overt blood loss. Labs reviewed, Hg 9. CMP and iron panel still pending. If her iron is low, I will give IV Ferahame in the near future. -Continue oral B12, Folic acid, iron ferrous sulfate BID daily. Will set up iv feraheme if ferritin<100  -Given symptoms, will monitor with labs and f/u every 2-3 months    Assessment and Plan    Anemia Chronic anemia with hemoglobin  at 8.4 g/dL, down from 10 g/dL six months ago. Symptoms include fatigue, weakness, and cold extremities. Previous IV iron (last in October 2022) and B12 injections (last in May 2024). Not taking oral iron due to constipation. Discussed risks of low hemoglobin and benefits of B12 and iron supplementation. Patient prefers oral B12 over injections. - Order B12 level - Prescribe oral B12 (1000 mcg daily) - Consider IV iron infusion if iron levels are low - Prescribe ferric maltol which has less GI side effect - Schedule follow-up every two months to monitor blood counts  Arthritis Chronic arthritis contributing to mobility issues. Uses a rollator and experiences shortness of breath with minimal exertion. - Continue current pain management regimen (tramadol and gabapentin) - Consider physical therapy referral if symptoms worsen  Hypertension Well-controlled on current medication regimen (amlodipine). - Continue current antihypertensive medications  Type 2 Diabetes Mellitus Managed with metformin, taken twice daily. - Continue metformin 500 mg twice daily  General Health Maintenance Received flu shot recently. - Encourage adherence to current medication regimen - Monitor for new symptoms or changes in health status  Follow-up - Schedule follow-up appointments every two months for blood count monitoring - Call with results of B12 and iron levels - Schedule IV iron infusion if iron levels are low.       Discussed the use of AI scribe software for clinical note transcription with the patient, who gave verbal consent to proceed.  History of Present Illness   A 77 year old patient with a history of anemia presents for a follow-up visit. The patient reports feeling weak and experiencing cold extremities, particularly in the hands  and feet. She also experiences shortness of breath, especially when walking long distances. She has been using an auto mode wear for the past three months due to  arthritis and weakness and uses a rollator at home to assist with mobility. The patient has been on various medications including vitamin D, blood pressure medicine, amlodipine, and folic acid. However, she has stopped taking some medications, including an iron pill, as advised by another doctor. The patient has a history of receiving IV iron and B12 injections for her anemia.         All other systems were reviewed with the patient and are negative.  MEDICAL HISTORY:  Past Medical History:  Diagnosis Date   Acute kidney injury (HCC) 01/11/2015   Alcohol abuse, in remission    Anemia    Anemia, iron deficiency 01/28/2015   Arthritis    Asthma    Bronchiectasis (HCC) 03/07/2016   Bronchitis    COPD (chronic obstructive pulmonary disease) (HCC)    Diabetes mellitus without complication (HCC)    type 2   DM II (diabetes mellitus, type II), controlled (HCC) 01/06/2015   Dyspnea and respiratory abnormality 04/06/2015   Dysrhythmia    hx of palpitations   E-coli UTI 01/11/2015   Essential hypertension 01/06/2015   GERD (gastroesophageal reflux disease)    Hay fever    Hepatitis    c treated with pills   HLD (hyperlipidemia) 01/06/2015   Hypertension    IBS (irritable bowel syndrome)    ILD (interstitial lung disease) (HCC) 12/11/2016   Leg cramps, sleep related 12/01/2015   Pneumonia    Rheumatoid arthritis (HCC) 1996   Sinus tachycardia 01/11/2015   LT monitor 01/2019: normal sinus rhythm, occ short bursts of PAT, accelerated junctional rhythm, no sig arrhythmias.     SURGICAL HISTORY: Past Surgical History:  Procedure Laterality Date   ABDOMINAL HYSTERECTOMY     BALLOON DILATION N/A 03/15/2018   Procedure: BALLOON DILATION;  Surgeon: Jeani Hawking, MD;  Location: [facility];  Service: Endoscopy;  Laterality: N/A;   CARPAL TUNNEL RELEASE Left 04/19/2018   Procedure: CARPAL TUNNEL RELEASE;  Surgeon: Coletta Memos, MD;  Location: [facility] OR;  Service: Neurosurgery;  Laterality: Left;  CARPAL  TUNNEL RELEASE   CESAREAN SECTION     COLONOSCOPY WITH PROPOFOL N/A 04/02/2015   Procedure: COLONOSCOPY WITH PROPOFOL;  Surgeon: Jeani Hawking, MD;  Location: [facility];  Service: Endoscopy;  Laterality: N/A;   ESOPHAGOGASTRODUODENOSCOPY (EGD) WITH PROPOFOL N/A 04/02/2015   Procedure: ESOPHAGOGASTRODUODENOSCOPY (EGD) WITH PROPOFOL;  Surgeon: Jeani Hawking, MD;  Location: [facility];  Service: Endoscopy;  Laterality: N/A;   ESOPHAGOGASTRODUODENOSCOPY (EGD) WITH PROPOFOL N/A 03/15/2018   Procedure: ESOPHAGOGASTRODUODENOSCOPY (EGD) WITH PROPOFOL;  Surgeon: Jeani Hawking, MD;  Location: [facility];  Service: Endoscopy;  Laterality: N/A;   HEMORRHOID SURGERY     HERNIA REPAIR     HERNIA REPAIR      I have reviewed the social history and family history with the patient and they are unchanged from previous note.  ALLERGIES:  is allergic to fish allergy; peanuts [peanut oil]; penicillins; iodinated contrast media; iodine; percocet [oxycodone-acetaminophen]; 2,4-d dimethylamine; peanut (diagnostic); peanut allergen powder-dnfp; hydrocodone; other; oxycodone; and sulfa antibiotics.  MEDICATIONS:  Current Outpatient Medications  Medication Sig Dispense Refill   Ferric Maltol (ACCRUFER) 30 MG CAPS Take 1 capsule (30 mg total) by mouth daily. 30 capsule 5   albuterol (PROVENTIL) (2.5 MG/3ML) 0.083% nebulizer solution USE 1 UNIT DOSE(VIAL)VIA NEBULIZER 4 TIMES A DAY AS NEEDED AS  DIRECTED 1080 mL 3   albuterol (VENTOLIN HFA) 108 (90 Base) MCG/ACT inhaler INHALE 2 PUFFS BY MOUTH EVERY 6 HRS AS NEEDED 54 g 4   amLODipine (NORVASC) 5 MG tablet TAKE 1 TABLET BY MOUTH IN THE MORNING AND AT BEDTIME. Pt must keep her follow up for future refills. 60 tablet 0   aspirin EC 81 MG tablet Take 1 tablet (81 mg total) by mouth daily. 90 tablet 3   atorvastatin (LIPITOR) 40 MG tablet ONE TABLET BY MOUTH DAILY 90 tablet 0   bumetanide (BUMEX) 2 MG tablet TAKE 1 TABLET BY MOUTH 2 TIMES A WEEK 24 tablet 3    Cholecalciferol (VITAMIN D3) 50 MCG (2000 UT) capsule Take 1 capsule (2,000 Units total) by mouth daily. 100 capsule 3   cyanocobalamin 1000 MCG tablet Take 1 tablet (1,000 mcg total) by mouth every Thursday. 30 tablet 5   cyclobenzaprine (FLEXERIL) 10 MG tablet TAKE ONE TABLET BY MOUTH AT BEDTIME 30 tablet 10   diclofenac Sodium (VOLTAREN) 1 % GEL APPLY 2 G TOPICALLY 4 TIMES A DAY 700 g 3   diphenoxylate-atropine (LOMOTIL) 2.5-0.025 MG tablet Take 1 tablet by mouth 4 (four) times daily as needed for diarrhea or loose stools.      docusate sodium (COLACE) 100 MG capsule Take 100 mg by mouth 2 (two) times daily as needed for mild constipation.     fluticasone (FLONASE) 50 MCG/ACT nasal spray Place 1 spray into both nostrils daily as needed for allergies or rhinitis.     fluticasone-salmeterol (ADVAIR) 500-50 MCG/ACT AEPB INHALE 1 PUFF BY MOUTH 2 TIMES A DAY 180 each 2   folic acid (FOLVITE) 1 MG tablet ONE TABLET BY MOUTH DAILY 90 tablet 0   gabapentin (NEURONTIN) 600 MG tablet Take 600 mg by mouth 3 (three) times daily.     losartan (COZAAR) 50 MG tablet TAKE 1 TABLET BY MOUTH DAILY 90 tablet 3   metFORMIN (GLUCOPHAGE) 500 MG tablet Take 500 mg by mouth 2 (two) times daily.     metFORMIN (GLUCOPHAGE-XR) 500 MG 24 hr tablet Take 500 mg by mouth 2 (two) times daily.     montelukast (SINGULAIR) 10 MG tablet TAKE 1 TABLET BY MOUTH DAILY 90 tablet 3   pantoprazole (PROTONIX) 40 MG tablet ONE TABLET BY MOUTH DAILY 90 tablet 2   SPIRIVA RESPIMAT 2.5 MCG/ACT AERS INHALE 2 PUFFS INTO THE LUNGS DAILY 12 g 0   Tiotropium Bromide Monohydrate (SPIRIVA RESPIMAT) 1.25 MCG/ACT AERS INHALE 2 PUFFS BY MOUTH ONCE A DAY 12 g 2   No current facility-administered medications for this visit.    PHYSICAL EXAMINATION: ECOG PERFORMANCE STATUS: 2 - Symptomatic, <50% confined to bed  Vitals:   09/20/23 1354  BP: (!) 145/53  Pulse: 95  Resp: 18  Temp: 97.7 F (36.5 C)  SpO2: 95%   Wt Readings from Last 3  Encounters:  09/20/23 210 lb 11.2 oz (95.6 kg)  05/04/23 196 lb (88.9 kg)  02/01/23 197 lb 8.5 oz (89.6 kg)     GENERAL:alert, no distress and comfortable SKIN: skin color, texture, turgor are normal, no rashes or significant lesions EYES: normal, Conjunctiva are pink and non-injected, sclera clear NECK: supple, thyroid normal size, non-tender, without nodularity LYMPH:  no palpable lymphadenopathy in the cervical, axillary  LUNGS: clear to auscultation and percussion with normal breathing effort HEART: regular rate & rhythm and no murmurs and no lower extremity edema ABDOMEN:abdomen soft, non-tender and normal bowel sounds Musculoskeletal:no cyanosis of digits and  no clubbing  NEURO: alert & oriented x 3 with fluent speech, no focal motor/sensory deficits    LABORATORY DATA:  I have reviewed the data as listed    Latest Ref Rng & Units 09/20/2023    1:16 PM 03/21/2023   12:57 PM 12/02/2021    2:11 PM  CBC  WBC 4.0 - 10.5 K/uL 4.8  6.1  5.5   Hemoglobin 12.0 - 15.0 g/dL 8.4  9.9  78.2   Hematocrit 36.0 - 46.0 % 27.4  31.5    31.5  34.3   Platelets 150 - 400 K/uL 293  307  281         Latest Ref Rng & Units 09/20/2023    1:16 PM 03/21/2023   12:57 PM 12/02/2021    2:11 PM  CMP  Glucose 70 - 99 mg/dL 956  213  086   BUN 8 - 23 mg/dL 21  18  12    Creatinine 0.44 - 1.00 mg/dL 5.78  4.69  6.29   Sodium 135 - 145 mmol/L 141  143  143   Potassium 3.5 - 5.1 mmol/L 4.5  3.8  3.9   Chloride 98 - 111 mmol/L 103  105  105   CO2 22 - 32 mmol/L 33  31  31   Calcium 8.9 - 10.3 mg/dL 9.7  9.2  9.7   Total Protein 6.5 - 8.1 g/dL 6.9  7.2  7.1   Total Bilirubin <1.2 mg/dL 0.2  0.2  0.2   Alkaline Phos 38 - 126 U/L 91  93  89   AST 15 - 41 U/L 14  15  9    ALT 0 - 44 U/L 10  14  7        RADIOGRAPHIC STUDIES: I have personally reviewed the radiological images as listed and agreed with the findings in the report. No results found.    No orders of the defined types were placed in this  encounter.  All questions were answered. The patient knows to call the clinic with any problems, questions or concerns. No barriers to learning was detected. The total time spent in the appointment was 20 minutes.     Malachy Mood, MD 09/20/2023

## 2023-09-20 NOTE — Assessment & Plan Note (Signed)
from GI AVM bleeding  -She has moderate anemia with baseline hemoglobin in 8-9 range, MCV normal, ferritin 16, serum iron 13, URBC elevated at 430, iron saturation 3%, this is consistent with iron deficient anemia -Anemia has been several years, likely secondary to slow GI bleeding. Her stool OB was positive. Colonoscopy showed AVM, last done in 03/2015. Her 03/15/18 Upper Endoscopy with Dr Elnoria Howard showed no evidence of bleeding.  -Her baseline SPEP and UPEP were negative.  -She also may have a component of anemia of chronic disease secondary to kidney disease and rheumatoid arthritis -She has received IV Feraheme as needed since 2016, most recently 02/2021. She responded well and continues oral iron BID.  -She continues to have progressively worse fatigue. She denies GI bleeding or overt blood loss. Labs reviewed, Hg 9. CMP and iron panel still pending. If her iron is low, I will give IV Ferahame in the near future. -Continue oral B12, Folic acid, iron ferrous sulfate BID daily. Will set up iv feraheme if ferritin<100  -Given symptoms, will monitor with labs and f/u every 2-3 months

## 2023-09-21 LAB — FOLATE RBC
Folate, Hemolysate: 527 ng/mL
Folate, RBC: 2027 ng/mL (ref >498)
Hematocrit: 26 % — ABNORMAL LOW (ref 34.0–46.6)

## 2023-10-04 ENCOUNTER — Other Ambulatory Visit: Payer: Self-pay | Admitting: Pulmonary Disease

## 2023-10-09 ENCOUNTER — Telehealth: Payer: Self-pay | Admitting: Pharmacy Technician

## 2023-10-09 ENCOUNTER — Other Ambulatory Visit: Payer: Self-pay

## 2023-10-09 NOTE — Telephone Encounter (Signed)
Anita Gonzalez note: patient will be scheduled as soon as possible.  Auth Submission: NO AUTH NEEDED Site of care: Site of care: [facility] Payer: [employer] / MEDICARE A ONLY Medication & CPT/J Code(s) submitted: Venofer (Iron Sucrose) J1756 Route of submission (phone, fax, portal):  Phone # Fax # Auth type: Buy/Bill PB Units/visits requested: 5 Reference number:  Approval from: 10/09/23 to 11/13/23

## 2023-10-09 NOTE — Progress Notes (Signed)
Orders given by Dr. Vista Lawman, [place]ace [place], MD  P [facility] Pod 1 Please let pt know her iron level is low and I recommend iv iron venofer 1.0-1.2g, OK to do at [facility], please order and get it scheduled for her, B12 level is good, thanks Malachy Mood   Verified & clarified orders w/readback w/Dr. Mosetta Putt.

## 2023-10-25 ENCOUNTER — Ambulatory Visit: Payer: Medicare HMO | Admitting: Pulmonary Disease

## 2023-10-30 ENCOUNTER — Ambulatory Visit: Payer: Medicare HMO

## 2023-11-01 ENCOUNTER — Ambulatory Visit: Payer: Medicare HMO

## 2023-11-08 ENCOUNTER — Ambulatory Visit: Payer: Medicare HMO

## 2023-11-12 ENCOUNTER — Ambulatory Visit: Payer: Medicare HMO

## 2023-11-15 ENCOUNTER — Ambulatory Visit: Payer: Medicare HMO

## 2023-11-15 MED ORDER — IRON SUCROSE 20 MG/ML IV SOLN: 200.0000 mg | Freq: Once | INTRAVENOUS | Status: DC

## 2023-11-15 MED ORDER — DIPHENHYDRAMINE HCL 25 MG PO CAPS: 25.0000 mg | ORAL_CAPSULE | Freq: Once | ORAL | Status: DC

## 2023-11-15 MED ORDER — ACETAMINOPHEN 325 MG PO TABS: 650.0000 mg | ORAL_TABLET | Freq: Once | ORAL | Status: DC

## 2023-11-16 ENCOUNTER — Encounter: Payer: Self-pay | Admitting: Hematology

## 2023-11-19 ENCOUNTER — Ambulatory Visit: Payer: Medicare HMO

## 2023-11-21 ENCOUNTER — Inpatient Hospital Stay: Payer: Medicare HMO | Attending: Hematology

## 2023-11-21 ENCOUNTER — Ambulatory Visit: Payer: Medicare HMO

## 2023-11-23 ENCOUNTER — Ambulatory Visit: Payer: Medicare HMO

## 2023-11-23 ENCOUNTER — Telehealth: Payer: Self-pay | Admitting: Hematology

## 2023-11-23 NOTE — Telephone Encounter (Signed)
 Left patient a voicemail in regards to rescheduling missed appointment; left callback number for scheduling line

## 2023-11-29 ENCOUNTER — Telehealth: Payer: Self-pay

## 2023-11-29 NOTE — Telephone Encounter (Signed)
Auth Submission: NO AUTH NEEDED Site of care: Site of care: [facility] Payer: [employer] / MEDICAID Medication & CPT/J Code(s) submitted: Venofer (Iron Sucrose) J1756 Route of submission (phone, fax, portal):  Phone # Fax # Auth type: Buy/Bill PB Units/visits requested: 200mg  x 4 doses left Reference number:  Approval from: 11/29/23 to 11/12/24

## 2023-12-10 ENCOUNTER — Encounter: Payer: Self-pay | Admitting: Hematology

## 2023-12-13 ENCOUNTER — Encounter: Payer: Self-pay | Admitting: Hematology

## 2023-12-15 ENCOUNTER — Encounter: Payer: Self-pay | Admitting: Hematology

## 2023-12-17 ENCOUNTER — Telehealth: Payer: Self-pay

## 2023-12-17 ENCOUNTER — Ambulatory Visit: Payer: Medicare HMO | Admitting: Primary Care

## 2023-12-17 NOTE — Telephone Encounter (Signed)
Pt is being seen today with Anita Sandy, NP for f/u. I was unable to find  DL for her. I called [employer] and spoke to [place], RT who stated she was not using a CPAP through their company. Pt was given a CPAP in January 2017, and the machine was picked up in April 2017. I then called and spoke to the pt who states she uses 2L O2 with exertion and HS.   Pt states she needs to reschedule her appointment today due to feeling sick. I offered to change her appointment to a virtual appointment and pt declined. Pt asked to be rescheduled. Sending to front office to cancel and reschedule pt. NFN.

## 2023-12-17 NOTE — Progress Notes (Deleted)
@Patient  ID: Anita Gonzalez, female    DOB: 01-28-1946, 78 y.o.   MRN: [id number]  No chief complaint on file.   Referring provider: [name], MD  HPI: 78 year old female, smoker quit in 2012.  Past medical history significant for pretension, coronary artery disease, ILD, bronchiectasis, COPD, respiratory failure O2 dependent, OSA, GERD, hepatic fibrosis, viral hepatitis C, type 2 diabetes, anemia, iron deficiency, obesity.    12/17/2023 Patient presents today for an overdue follow-up.  She is no longer on CPAP, she was given CPAP machine in January 2017 however machine was picked up in April 2017.  Patient uses 2 L of oxygen with exertion and at night.     Allergies  Allergen Reactions   Fish Allergy Anaphylaxis, Hives, Swelling and Other (See Comments)   Peanuts [Peanut Oil] Anaphylaxis, Hives, Itching and Swelling   Penicillins Hives, Itching, Swelling and Other (See Comments)    PATIENT HAS HAD A PCN REACTION WITH IMMEDIATE RASH, FACIAL/TONGUE/THROAT SWELLING, SOB, OR LIGHTHEADEDNESS WITH HYPOTENSION:  #  #  YES  #  #  HAS PT DEVELOPED SEVERE RASH INVOLVING MUCUS MEMBRANES or SKIN NECROSIS: #  #  YES  #  # PATIENT HAS HAD A PCN REACTION THAT REQUIRED HOSPITALIZATION:  #  #  YES  #  #  Has patient had a PCN reaction occurring within the last 10 years: No    Iodinated Contrast Media Hives, Itching and Swelling    SWELLING REACTION UNSPECIFIED    Iodine Hives, Itching and Swelling    Patient reports receiving IV dye in abdominal CT scan without issue   Percocet [Oxycodone-Acetaminophen] Nausea Only, Anxiety and Other (See Comments)    Raised blood pressure, Sweats, nervous,dizziness   2,4-D Dimethylamine Other (See Comments)   Peanut (Diagnostic) Other (See Comments)   Peanut Allergen Powder-Dnfp    Hydrocodone Itching and Nausea Only   Other Other (See Comments)    UNSPECIFIED REACTION  Hypersensitive to perfumes, colognes, powders, lotions, room sprays   Oxycodone  Itching, Nausea Only and Other (See Comments)    Sweats and dizziness   Sulfa Antibiotics     UNSPECIFIED REACTION     Immunization History  Administered Date(s) Administered   Influenza Split 09/15/2015   Influenza, High Dose Seasonal PF 09/13/2020   PFIZER(Purple Top)SARS-COV-2 Vaccination 02/28/2020, 03/20/2020, 09/22/2020    Past Medical History:  Diagnosis Date   Acute kidney injury (HCC) 01/11/2015   Alcohol abuse, in remission    Anemia    Anemia, iron deficiency 01/28/2015   Arthritis    Asthma    Bronchiectasis (HCC) 03/07/2016   Bronchitis    COPD (chronic obstructive pulmonary disease) (HCC)    Diabetes mellitus without complication (HCC)    type 2   DM II (diabetes mellitus, type II), controlled (HCC) 01/06/2015   Dyspnea and respiratory abnormality 04/06/2015   Dysrhythmia    hx of palpitations   E-coli UTI 01/11/2015   Essential hypertension 01/06/2015   GERD (gastroesophageal reflux disease)    Hay fever    Hepatitis    c treated with pills   HLD (hyperlipidemia) 01/06/2015   Hypertension    IBS (irritable bowel syndrome)    ILD (interstitial lung disease) (HCC) 12/11/2016   Leg cramps, sleep related 12/01/2015   Pneumonia    Rheumatoid arthritis (HCC) 1996   Sinus tachycardia 01/11/2015   LT monitor 01/2019: normal sinus rhythm, occ short bursts of PAT, accelerated junctional rhythm, no sig arrhythmias.     Tobacco  History: Social History   Tobacco Use  Smoking Status Former   Current packs/day: 0.00   Average packs/day: 0.8 packs/day for 25.0 years (18.8 ttl pk-yrs)   Types: Cigarettes   Start date: 01/11/1986   Quit date: 01/12/2011   Years since quitting: 12.9  Smokeless Tobacco Never   Counseling given: Not Answered   Outpatient Medications Prior to Visit  Medication Sig Dispense Refill   albuterol (PROVENTIL) (2.5 MG/3ML) 0.083% nebulizer solution USE 1 UNIT DOSE(VIAL)VIA NEBULIZER 4 TIMES A DAY AS NEEDED AS DIRECTED 1080 mL 3   albuterol  (VENTOLIN HFA) 108 (90 Base) MCG/ACT inhaler INHALE 2 PUFFS BY MOUTH EVERY 6 HRS AS NEEDED 54 g 4   amLODipine (NORVASC) 5 MG tablet TAKE 1 TABLET BY MOUTH IN THE MORNING AND AT BEDTIME. Pt must keep her follow up for future refills. 60 tablet 0   aspirin EC 81 MG tablet Take 1 tablet (81 mg total) by mouth daily. 90 tablet 3   atorvastatin (LIPITOR) 40 MG tablet ONE TABLET BY MOUTH DAILY 90 tablet 0   bumetanide (BUMEX) 2 MG tablet TAKE 1 TABLET BY MOUTH 2 TIMES A WEEK 24 tablet 3   Cholecalciferol (VITAMIN D3) 50 MCG (2000 UT) capsule Take 1 capsule (2,000 Units total) by mouth daily. 100 capsule 3   cyanocobalamin 1000 MCG tablet Take 1 tablet (1,000 mcg total) by mouth every Thursday. 30 tablet 5   cyclobenzaprine (FLEXERIL) 10 MG tablet TAKE ONE TABLET BY MOUTH AT BEDTIME 30 tablet 10   diclofenac Sodium (VOLTAREN) 1 % GEL APPLY 2 G TOPICALLY 4 TIMES A DAY 700 g 3   diphenoxylate-atropine (LOMOTIL) 2.5-0.025 MG tablet Take 1 tablet by mouth 4 (four) times daily as needed for diarrhea or loose stools.      docusate sodium (COLACE) 100 MG capsule Take 100 mg by mouth 2 (two) times daily as needed for mild constipation.     Ferric Maltol (ACCRUFER) 30 MG CAPS Take 1 capsule (30 mg total) by mouth daily. 30 capsule 5   fluticasone (FLONASE) 50 MCG/ACT nasal spray Place 1 spray into both nostrils daily as needed for allergies or rhinitis.     fluticasone-salmeterol (ADVAIR) 500-50 MCG/ACT AEPB INHALE 1 PUFF BY MOUTH 2 TIMES A DAY 60 each 3   folic acid (FOLVITE) 1 MG tablet ONE TABLET BY MOUTH DAILY 90 tablet 0   gabapentin (NEURONTIN) 600 MG tablet Take 600 mg by mouth 3 (three) times daily.     losartan (COZAAR) 50 MG tablet TAKE 1 TABLET BY MOUTH DAILY 90 tablet 3   metFORMIN (GLUCOPHAGE) 500 MG tablet Take 500 mg by mouth 2 (two) times daily.     metFORMIN (GLUCOPHAGE-XR) 500 MG 24 hr tablet Take 500 mg by mouth 2 (two) times daily.     montelukast (SINGULAIR) 10 MG tablet TAKE 1 TABLET BY  MOUTH DAILY 90 tablet 3   pantoprazole (PROTONIX) 40 MG tablet ONE TABLET BY MOUTH DAILY 90 tablet 2   SPIRIVA RESPIMAT 2.5 MCG/ACT AERS INHALE 2 PUFFS INTO THE LUNGS DAILY 12 g 0   Tiotropium Bromide Monohydrate (SPIRIVA RESPIMAT) 1.25 MCG/ACT AERS INHALE 2 PUFFS BY MOUTH ONCE A DAY 12 g 2   No facility-administered medications prior to visit.      Review of Systems  Review of Systems   Physical Exam  There were no vitals taken for this visit. Physical Exam   Lab Results:  CBC    Component Value Date/Time   WBC 4.8  09/20/2023 1316   WBC 5.5 12/02/2021 1411   RBC 2.95 (L) 09/20/2023 1316   HGB 8.4 (L) 09/20/2023 1316   HGB 10.9 (L) 04/09/2019 1530   HGB 11.9 07/24/2017 1334   HCT 27.4 (L) 09/20/2023 1316   HCT 26.0 (L) 09/20/2023 1315   HCT 37.1 07/24/2017 1334   PLT 293 09/20/2023 1316   PLT 281 04/09/2019 1530   MCV 92.9 09/20/2023 1316   MCV 93 04/09/2019 1530   MCV 96.7 07/24/2017 1334   MCH 28.5 09/20/2023 1316   MCHC 30.7 09/20/2023 1316   RDW 15.6 (H) 09/20/2023 1316   RDW 13.3 04/09/2019 1530   RDW 14.6 (H) 07/24/2017 1334   LYMPHSABS 1.3 09/20/2023 1316   LYMPHSABS 1.8 07/24/2017 1334   MONOABS 0.4 09/20/2023 1316   MONOABS 0.4 07/24/2017 1334   EOSABS 0.2 09/20/2023 1316   EOSABS 0.2 07/24/2017 1334   BASOSABS 0.0 09/20/2023 1316   BASOSABS 0.0 07/24/2017 1334    BMET    Component Value Date/Time   NA 141 09/20/2023 1316   NA 143 07/26/2020 1328   NA 142 02/25/2015 1402   K 4.5 09/20/2023 1316   K 4.0 02/25/2015 1402   CL 103 09/20/2023 1316   CO2 33 (H) 09/20/2023 1316   CO2 30 (H) 02/25/2015 1402   GLUCOSE 104 (H) 09/20/2023 1316   GLUCOSE 89 02/25/2015 1402   BUN 21 09/20/2023 1316   BUN 18 07/26/2020 1328   BUN 11.2 02/25/2015 1402   CREATININE 1.36 (H) 09/20/2023 1316   CREATININE 1.0 02/25/2015 1402   CALCIUM 9.7 09/20/2023 1316   CALCIUM 8.8 02/25/2015 1402   GFRNONAA 41 (L) 09/20/2023 1316   GFRAA 36 (L) 07/26/2020 1328    GFRAA 40 (L) 05/21/2020 1444    BNP No results found for: "BNP"  ProBNP    Component Value Date/Time   PROBNP 753 (H) 07/26/2020 1328    Imaging: No results found.   Assessment & Plan:   No problem-specific Assessment & Plan notes found for this encounter.     Glenford Bayley, NP 12/17/2023

## 2023-12-20 ENCOUNTER — Ambulatory Visit: Payer: Medicare HMO | Admitting: Podiatry

## 2023-12-25 ENCOUNTER — Ambulatory Visit: Payer: Medicare HMO

## 2023-12-26 DIAGNOSIS — E611 Iron deficiency: Secondary | ICD-10-CM | POA: Diagnosis not present

## 2023-12-26 DIAGNOSIS — I119 Hypertensive heart disease without heart failure: Secondary | ICD-10-CM | POA: Diagnosis not present

## 2023-12-26 DIAGNOSIS — D519 Vitamin B12 deficiency anemia, unspecified: Secondary | ICD-10-CM | POA: Diagnosis not present

## 2023-12-26 DIAGNOSIS — E538 Deficiency of other specified B group vitamins: Secondary | ICD-10-CM | POA: Diagnosis not present

## 2023-12-26 DIAGNOSIS — E1129 Type 2 diabetes mellitus with other diabetic kidney complication: Secondary | ICD-10-CM | POA: Diagnosis not present

## 2023-12-26 DIAGNOSIS — G894 Chronic pain syndrome: Secondary | ICD-10-CM | POA: Diagnosis not present

## 2023-12-26 DIAGNOSIS — E782 Mixed hyperlipidemia: Secondary | ICD-10-CM | POA: Diagnosis not present

## 2023-12-26 DIAGNOSIS — J449 Chronic obstructive pulmonary disease, unspecified: Secondary | ICD-10-CM | POA: Diagnosis not present

## 2023-12-27 ENCOUNTER — Ambulatory Visit: Payer: Medicare HMO

## 2023-12-27 MED ORDER — ACETAMINOPHEN 325 MG PO TABS: 650.0000 mg | ORAL_TABLET | Freq: Once | ORAL | Status: AC

## 2023-12-27 MED ORDER — DIPHENHYDRAMINE HCL 25 MG PO CAPS: 25.0000 mg | ORAL_CAPSULE | Freq: Once | ORAL | Status: AC

## 2023-12-27 MED ORDER — IRON SUCROSE 20 MG/ML IV SOLN
200.0000 mg | Freq: Once | INTRAVENOUS | Status: AC
Start: 2023-12-27 — End: ?

## 2023-12-31 ENCOUNTER — Ambulatory Visit: Payer: Medicare HMO | Admitting: Podiatry

## 2024-01-01 ENCOUNTER — Ambulatory Visit: Payer: Medicare HMO

## 2024-01-01 VITALS — BP 144/61 | HR 74 | Temp 97.9°F | Resp 20 | Ht 61.0 in | Wt 211.6 lb

## 2024-01-01 DIAGNOSIS — D509 Iron deficiency anemia, unspecified: Secondary | ICD-10-CM

## 2024-01-01 DIAGNOSIS — D500 Iron deficiency anemia secondary to blood loss (chronic): Secondary | ICD-10-CM | POA: Diagnosis not present

## 2024-01-01 DIAGNOSIS — Q2730 Arteriovenous malformation, site unspecified: Secondary | ICD-10-CM | POA: Diagnosis not present

## 2024-01-01 DIAGNOSIS — K922 Gastrointestinal hemorrhage, unspecified: Secondary | ICD-10-CM | POA: Diagnosis not present

## 2024-01-01 MED ORDER — ACETAMINOPHEN 325 MG PO TABS
650.0000 mg | ORAL_TABLET | Freq: Once | ORAL | Status: AC
  Administered 2024-01-01: 650 mg via ORAL
  Filled 2024-01-01: qty 2

## 2024-01-01 MED ORDER — IRON SUCROSE 20 MG/ML IV SOLN
200.0000 mg | Freq: Once | INTRAVENOUS | Status: AC
  Administered 2024-01-01: 200 mg via INTRAVENOUS
  Filled 2024-01-01: qty 10

## 2024-01-01 MED ORDER — DIPHENHYDRAMINE HCL 25 MG PO CAPS: 25.0000 mg | ORAL_CAPSULE | Freq: Once | ORAL | Status: DC

## 2024-01-01 NOTE — Progress Notes (Signed)
 Diagnosis: Acute Anemia  Provider:  Chilton Greathouse MD  Procedure: IV Push  IV Type: Peripheral, IV Location: L Forearm  Venofer (Iron Sucrose), Dose: 200 mg  Post Infusion IV Care: Observation period completed and Peripheral IV Discontinued  Discharge: Condition: Good, Destination: Home . AVS Declined  Performed by:  Nat Math, RN

## 2024-01-03 ENCOUNTER — Ambulatory Visit: Payer: Medicare HMO

## 2024-01-03 VITALS — BP 126/72 | HR 80 | Temp 97.5°F | Resp 20 | Ht 61.0 in | Wt 211.6 lb

## 2024-01-03 DIAGNOSIS — D509 Iron deficiency anemia, unspecified: Secondary | ICD-10-CM

## 2024-01-03 DIAGNOSIS — K922 Gastrointestinal hemorrhage, unspecified: Secondary | ICD-10-CM | POA: Diagnosis not present

## 2024-01-03 DIAGNOSIS — Q2730 Arteriovenous malformation, site unspecified: Secondary | ICD-10-CM | POA: Diagnosis not present

## 2024-01-03 DIAGNOSIS — D500 Iron deficiency anemia secondary to blood loss (chronic): Secondary | ICD-10-CM | POA: Diagnosis not present

## 2024-01-03 MED ORDER — ACETAMINOPHEN 325 MG PO TABS
650.0000 mg | ORAL_TABLET | Freq: Once | ORAL | Status: AC
  Administered 2024-01-03: 650 mg via ORAL

## 2024-01-03 MED ORDER — DIPHENHYDRAMINE HCL 25 MG PO CAPS
25.0000 mg | ORAL_CAPSULE | Freq: Once | ORAL | Status: AC
  Administered 2024-01-03: 25 mg via ORAL

## 2024-01-03 MED ORDER — IRON SUCROSE 20 MG/ML IV SOLN
200.0000 mg | Freq: Once | INTRAVENOUS | Status: AC
  Administered 2024-01-03: 200 mg via INTRAVENOUS
  Filled 2024-01-03: qty 10

## 2024-01-03 NOTE — Progress Notes (Signed)
Diagnosis: Iron Deficiency Anemia  Provider:  Chilton Greathouse MD  Procedure: IV Push  IV Type: Peripheral, IV Location: L Forearm  Venofer (Iron Sucrose), Dose: 200 mg  Post Infusion IV Care: Observation period completed and Peripheral IV Discontinued  Discharge: Condition: Stable, Destination: Home . AVS Provided  Performed by:  Loney Hering, LPN

## 2024-01-08 ENCOUNTER — Ambulatory Visit (INDEPENDENT_AMBULATORY_CARE_PROVIDER_SITE_OTHER): Payer: Medicare HMO

## 2024-01-08 VITALS — BP 129/77 | HR 72 | Temp 97.9°F | Resp 20 | Ht 61.0 in | Wt 211.6 lb

## 2024-01-08 DIAGNOSIS — D500 Iron deficiency anemia secondary to blood loss (chronic): Secondary | ICD-10-CM | POA: Diagnosis not present

## 2024-01-08 DIAGNOSIS — K922 Gastrointestinal hemorrhage, unspecified: Secondary | ICD-10-CM | POA: Diagnosis not present

## 2024-01-08 DIAGNOSIS — Q2730 Arteriovenous malformation, site unspecified: Secondary | ICD-10-CM

## 2024-01-08 DIAGNOSIS — D509 Iron deficiency anemia, unspecified: Secondary | ICD-10-CM

## 2024-01-08 MED ORDER — ACETAMINOPHEN 325 MG PO TABS
650.0000 mg | ORAL_TABLET | Freq: Once | ORAL | Status: AC
  Administered 2024-01-08: 650 mg via ORAL
  Filled 2024-01-08: qty 2

## 2024-01-08 MED ORDER — DIPHENHYDRAMINE HCL 25 MG PO CAPS
25.0000 mg | ORAL_CAPSULE | Freq: Once | ORAL | Status: AC
  Administered 2024-01-08: 25 mg via ORAL
  Filled 2024-01-08: qty 1

## 2024-01-08 MED ORDER — IRON SUCROSE 20 MG/ML IV SOLN
200.0000 mg | Freq: Once | INTRAVENOUS | Status: AC
Start: 2024-01-08 — End: 2024-01-08
  Administered 2024-01-08: 200 mg via INTRAVENOUS
  Filled 2024-01-08: qty 10

## 2024-01-08 NOTE — Progress Notes (Signed)
 Diagnosis: Iron Deficiency Anemia  Provider:  Chilton Greathouse MD  Procedure: IV Push  IV Type: Peripheral, IV Location: L Forearm  Venofer (Iron Sucrose), Dose: 200 mg  Post Infusion IV Care: Observation period completed and Peripheral IV Discontinued  Discharge: Condition: Good, Destination: Home . AVS Declined  Performed by:  Nat Math BSN

## 2024-01-10 ENCOUNTER — Ambulatory Visit (INDEPENDENT_AMBULATORY_CARE_PROVIDER_SITE_OTHER): Payer: Medicare HMO

## 2024-01-10 VITALS — BP 153/70 | HR 84 | Temp 98.2°F | Resp 20 | Ht 61.0 in | Wt 211.6 lb

## 2024-01-10 DIAGNOSIS — D500 Iron deficiency anemia secondary to blood loss (chronic): Secondary | ICD-10-CM

## 2024-01-10 DIAGNOSIS — Q2730 Arteriovenous malformation, site unspecified: Secondary | ICD-10-CM | POA: Diagnosis not present

## 2024-01-10 DIAGNOSIS — K922 Gastrointestinal hemorrhage, unspecified: Secondary | ICD-10-CM

## 2024-01-10 DIAGNOSIS — D509 Iron deficiency anemia, unspecified: Secondary | ICD-10-CM

## 2024-01-10 MED ORDER — IRON SUCROSE 20 MG/ML IV SOLN
200.0000 mg | Freq: Once | INTRAVENOUS | Status: AC
  Administered 2024-01-10: 200 mg via INTRAVENOUS
  Filled 2024-01-10: qty 10

## 2024-01-10 MED ORDER — ACETAMINOPHEN 325 MG PO TABS
650.0000 mg | ORAL_TABLET | Freq: Once | ORAL | Status: AC
  Administered 2024-01-10: 650 mg via ORAL
  Filled 2024-01-10: qty 2

## 2024-01-10 MED ORDER — DIPHENHYDRAMINE HCL 25 MG PO CAPS
25.0000 mg | ORAL_CAPSULE | Freq: Once | ORAL | Status: AC
  Administered 2024-01-10: 25 mg via ORAL
  Filled 2024-01-10: qty 1

## 2024-01-10 NOTE — Progress Notes (Signed)
 Diagnosis: Iron Deficiency Anemia  Provider:  Chilton Greathouse MD  Procedure: IV Push  IV Type: Peripheral, IV Location: L Forearm  Venofer (Iron Sucrose), Dose: 200 mg  Post Infusion IV Care: Observation period completed and Peripheral IV Discontinued  Discharge: Condition: Good, Destination: Home . AVS Provided  Performed by:  Loney Hering, LPN

## 2024-01-14 ENCOUNTER — Ambulatory Visit (INDEPENDENT_AMBULATORY_CARE_PROVIDER_SITE_OTHER): Payer: Medicare HMO | Admitting: Podiatry

## 2024-01-14 DIAGNOSIS — M79675 Pain in left toe(s): Secondary | ICD-10-CM | POA: Diagnosis not present

## 2024-01-14 DIAGNOSIS — B351 Tinea unguium: Secondary | ICD-10-CM

## 2024-01-14 DIAGNOSIS — E1149 Type 2 diabetes mellitus with other diabetic neurological complication: Secondary | ICD-10-CM

## 2024-01-14 DIAGNOSIS — M79674 Pain in right toe(s): Secondary | ICD-10-CM

## 2024-01-14 NOTE — Progress Notes (Unsigned)
 Subjective:   Patient ID: Anita Gonzalez, female   DOB: 78 y.o.   MRN: [id number]   HPI Chief Complaint  Patient presents with   [place] Day    RM#[id number] np splitting sensation behind the toes discomfort with walking  Right toes are sore DFC/nail trim Patient states symptoms started 1 year ago.    Burning ankles to foot started last year- no treatment Toes stay coldball of the foot feetls splint and someone split it and sowed it back up.  On gabapetin bid for her knee- 600mg   Last BS was 124 this morning Last A1c- unknown   No history of nonhealing ulceration     ROS      Objective:  Physical Exam  ***     Assessment:  ***     Plan:  ***

## 2024-01-14 NOTE — Patient Instructions (Addendum)
 You can also try Aspercreme with lidocaine for topical capsaicin cream to help.  --  Peripheral Neuropathy Peripheral neuropathy is a type of nerve damage. It affects nerves that carry signals between the spinal cord and the arms, legs, and the rest of the body (peripheral nerves). It does not affect nerves in the spinal cord or brain. In peripheral neuropathy, one nerve or a group of nerves may be damaged. Peripheral neuropathy is a broad category that includes many specific nerve disorders, like diabetic neuropathy, hereditary neuropathy, and carpal tunnel syndrome. What are the causes? This condition may be caused by: Certain diseases, such as: Diabetes. This is the most common cause of peripheral neuropathy. Autoimmune diseases, such as rheumatoid arthritis and systemic lupus erythematosus. Nerve diseases that are passed from parent to child (inherited). Kidney disease. Thyroid disease. Other causes may include: Nerve injury. Pressure or stress on a nerve that lasts a long time. Lack (deficiency) of B vitamins. This can result from alcoholism, poor diet, or a restricted diet. Infections. Some medicines, such as cancer medicines (chemotherapy). Poisonous (toxic) substances, such as lead and mercury. Too little blood flowing to the legs. In some cases, the cause of this condition is not known. What are the signs or symptoms? Symptoms of this condition depend on which of your nerves is damaged. Symptoms in the legs, hands, and arms can include: Loss of feeling (numbness) in the feet, hands, or both. Tingling in the feet, hands, or both. Burning pain. Very sensitive skin. Weakness. Not being able to move a part of the body (paralysis). Clumsiness or poor coordination. Muscle twitching. Loss of balance. Symptoms in other parts of the body can include: Not being able to control your bladder. Feeling dizzy. Sexual problems. How is this diagnosed? Diagnosing and finding the cause  of peripheral neuropathy can be difficult. Your health care provider will take your medical history and do a physical exam. A neurological exam will also be done. This involves checking things that are affected by your brain, spinal cord, and nerves (nervous system). For example, your health care provider will check your reflexes, how you move, and what you can feel. You may have other tests, such as: Blood tests. Electromyogram (EMG) and nerve conduction tests. These tests check nerve function and how well the nerves are controlling the muscles. Imaging tests, such as a CT scan or MRI, to rule out other causes of your symptoms. Removing a small piece of nerve to be examined in a lab (nerve biopsy). Removing and examining a small amount of the fluid that surrounds the brain and spinal cord (lumbar puncture). How is this treated? Treatment for this condition may involve: Treating the underlying cause of the neuropathy, such as diabetes, kidney disease, or vitamin deficiencies. Stopping medicines that can cause neuropathy, such as chemotherapy. Medicine to help relieve pain. Medicines may include: Prescription or over-the-counter pain medicine. Anti-seizure medicine. Antidepressants. Pain-relieving patches that are applied to painful areas of skin. Surgery to relieve pressure on a nerve or to destroy a nerve that is causing pain. Physical therapy to help improve movement and balance. Devices to help you move around (assistive devices). Follow these instructions at home: Medicines Take over-the-counter and prescription medicines only as told by your health care provider. Do not take any other medicines without first asking your health care provider. Ask your health care provider if the medicine prescribed to you requires you to avoid driving or using machinery. Lifestyle  Do not use any products that contain nicotine  or tobacco. These products include cigarettes, chewing tobacco, and vaping  devices, such as e-cigarettes. Smoking keeps blood from reaching damaged nerves. If you need help quitting, ask your health care provider. Avoid or limit alcohol. Too much alcohol can cause a vitamin B deficiency, and vitamin B is needed for healthy nerves. Eat a healthy diet. This includes: Eating foods that are high in fiber, such as beans, whole grains, and fresh fruits and vegetables. Limiting foods that are high in fat and processed sugars, such as fried or sweet foods. General instructions  If you have diabetes, work closely with your health care provider to keep your blood sugar under control. If you have numbness in your feet: Check every day for signs of injury or infection. Watch for redness, warmth, and swelling. Wear padded socks and comfortable shoes. These help protect your feet. Develop a good support system. Living with peripheral neuropathy can be stressful. Consider talking with a mental health specialist or joining a support group. Use assistive devices and attend physical therapy as told by your health care provider. This may include using a walker or a cane. Keep all follow-up visits. This is important. Where to find more information [employer] of Neurological Disorders: [employer] Contact a health care provider if: You have new signs or symptoms of peripheral neuropathy. You are struggling emotionally from dealing with peripheral neuropathy. Your pain is not well controlled. Get help right away if: You have an injury or infection that is not healing normally. You develop new weakness in an arm or leg. You have fallen or do so frequently. Summary Peripheral neuropathy is when the nerves in the arms or legs are damaged, resulting in numbness, weakness, or pain. There are many causes of peripheral neuropathy, including diabetes, pinched nerves, vitamin deficiencies, autoimmune disease, and hereditary conditions. Diagnosing and finding the cause of  peripheral neuropathy can be difficult. Your health care provider will take your medical history, do a physical exam, and do tests, including blood tests and nerve function tests. Treatment involves treating the underlying cause of the neuropathy and taking medicines to help control pain. Physical therapy and assistive devices may also help. This information is not intended to replace advice given to you by your health care provider. Make sure you discuss any questions you have with your health care provider. Document Revised: 07/05/2021 Document Reviewed: 07/05/2021 Elsevier Patient Education  2024 [employer]  Diabetes Mellitus and Foot Care Diabetes, also called diabetes mellitus, may cause problems with your feet and legs because of poor blood flow (circulation). Poor circulation may make your skin: Become thinner and drier. Break more easily. Heal more slowly. Peel and crack. You may also have nerve damage (neuropathy). This can cause decreased feeling in your legs and feet. This means that you may not notice minor injuries to your feet that could lead to more serious problems. Finding and treating problems early is the best way to prevent future foot problems. How to care for your feet Foot hygiene  Wash your feet daily with warm water and mild soap. Do not use hot water. Then, pat your feet and the areas between your toes until they are fully dry. Do not soak your feet. This can dry your skin. Trim your toenails straight across. Do not dig under them or around the cuticle. File the edges of your nails with an emery board or nail file. Apply a moisturizing lotion or petroleum jelly to the skin on your feet and to dry, brittle toenails.  Use lotion that does not contain alcohol and is unscented. Do not apply lotion between your toes. Shoes and socks Wear clean socks or stockings every day. Make sure they are not too tight. Do not wear knee-high stockings. These may decrease blood flow to  your legs. Wear shoes that fit well and have enough cushioning. Always look in your shoes before you put them on to be sure there are no objects inside. To break in new shoes, wear them for just a few hours a day. This prevents injuries on your feet. Wounds, scrapes, corns, and calluses  Check your feet daily for blisters, cuts, bruises, sores, and redness. If you cannot see the bottom of your feet, use a mirror or ask someone for help. Do not cut off corns or calluses or try to remove them with medicine. If you find a minor scrape, cut, or break in the skin on your feet, keep it and the skin around it clean and dry. You may clean these areas with mild soap and water. Do not clean the area with peroxide, alcohol, or iodine. If you have a wound, scrape, corn, or callus on your foot, look at it several times a day to make sure it is healing and not infected. Check for: Redness, swelling, or pain. Fluid or blood. Warmth. Pus or a bad smell. General tips Do not cross your legs. This may decrease blood flow to your feet. Do not use heating pads or hot water bottles on your feet. They may burn your skin. If you have lost feeling in your feet or legs, you may not know this is happening until it is too late. Protect your feet from hot and cold by wearing shoes, such as at the beach or on hot pavement. Schedule a complete foot exam at least once a year or more often if you have foot problems. Report any cuts, sores, or bruises to your health care provider right away. Where to find more information American Diabetes Association: diabetes.org Association of Diabetes Care & Education Specialists: diabeteseducator.org Contact a health care provider if: You have a condition that increases your risk of infection, and you have any cuts, sores, or bruises on your feet. You have an injury that is not healing. You have redness on your legs or feet. You feel burning or tingling in your legs or feet. You have  pain or cramps in your legs and feet. Your legs or feet are numb. Your feet always feel cold. You have pain around any toenails. Get help right away if: You have a wound, scrape, corn, or callus on your foot and: You have signs of infection. You have a fever. You have a red line going up your leg. This information is not intended to replace advice given to you by your health care provider. Make sure you discuss any questions you have with your health care provider. Document Revised: 05/03/2022 Document Reviewed: 05/03/2022 Elsevier Patient Education  2024 [employer].

## 2024-01-16 ENCOUNTER — Inpatient Hospital Stay: Payer: Medicare HMO | Attending: Hematology

## 2024-01-16 DIAGNOSIS — D500 Iron deficiency anemia secondary to blood loss (chronic): Secondary | ICD-10-CM | POA: Insufficient documentation

## 2024-01-16 DIAGNOSIS — K922 Gastrointestinal hemorrhage, unspecified: Secondary | ICD-10-CM | POA: Insufficient documentation

## 2024-01-16 DIAGNOSIS — E611 Iron deficiency: Secondary | ICD-10-CM

## 2024-01-16 LAB — CBC WITH DIFFERENTIAL (CANCER CENTER ONLY)
Abs Immature Granulocytes: 0.03 10*3/uL (ref 0.00–0.07)
Basophils Absolute: 0 10*3/uL (ref 0.0–0.1)
Basophils Relative: 0 %
Eosinophils Absolute: 0.2 10*3/uL (ref 0.0–0.5)
Eosinophils Relative: 3 %
HCT: 30 % — ABNORMAL LOW (ref 36.0–46.0)
Hemoglobin: 9 g/dL — ABNORMAL LOW (ref 12.0–15.0)
Immature Granulocytes: 1 %
Lymphocytes Relative: 27 %
Lymphs Abs: 1.6 10*3/uL (ref 0.7–4.0)
MCH: 29.9 pg (ref 26.0–34.0)
MCHC: 30 g/dL (ref 30.0–36.0)
MCV: 99.7 fL (ref 80.0–100.0)
Monocytes Absolute: 0.4 10*3/uL (ref 0.1–1.0)
Monocytes Relative: 6 %
Neutro Abs: 3.7 10*3/uL (ref 1.7–7.7)
Neutrophils Relative %: 63 %
Platelet Count: 277 10*3/uL (ref 150–400)
RBC: 3.01 MIL/uL — ABNORMAL LOW (ref 3.87–5.11)
RDW: 16.7 % — ABNORMAL HIGH (ref 11.5–15.5)
WBC Count: 5.9 10*3/uL (ref 4.0–10.5)
nRBC: 0 % (ref 0.0–0.2)

## 2024-01-16 LAB — CMP (CANCER CENTER ONLY)
ALT: 11 U/L (ref 0–44)
AST: 14 U/L — ABNORMAL LOW (ref 15–41)
Albumin: 4 g/dL (ref 3.5–5.0)
Alkaline Phosphatase: 84 U/L (ref 38–126)
Anion gap: 4 — ABNORMAL LOW (ref 5–15)
BUN: 15 mg/dL (ref 8–23)
CO2: 33 mmol/L — ABNORMAL HIGH (ref 22–32)
Calcium: 9.1 mg/dL (ref 8.9–10.3)
Chloride: 104 mmol/L (ref 98–111)
Creatinine: 1.05 mg/dL — ABNORMAL HIGH (ref 0.44–1.00)
GFR, Estimated: 55 mL/min — ABNORMAL LOW (ref >60)
Glucose, Bld: 124 mg/dL — ABNORMAL HIGH (ref 70–99)
Potassium: 4.4 mmol/L (ref 3.5–5.1)
Sodium: 141 mmol/L (ref 135–145)
Total Bilirubin: 0.2 mg/dL (ref 0.0–1.2)
Total Protein: 6.8 g/dL (ref 6.5–8.1)

## 2024-01-21 ENCOUNTER — Encounter: Payer: Self-pay | Admitting: Hematology

## 2024-01-21 ENCOUNTER — Ambulatory Visit: Payer: Medicare HMO

## 2024-01-21 MED ORDER — IRON SUCROSE 20 MG/ML IV SOLN: 200.0000 mg | Freq: Once | INTRAVENOUS | Status: DC

## 2024-01-21 MED ORDER — DIPHENHYDRAMINE HCL 25 MG PO CAPS: 25.0000 mg | ORAL_CAPSULE | Freq: Once | ORAL | Status: DC

## 2024-01-21 MED ORDER — ACETAMINOPHEN 325 MG PO TABS: 650.0000 mg | ORAL_TABLET | Freq: Once | ORAL | Status: DC

## 2024-01-30 ENCOUNTER — Other Ambulatory Visit: Payer: Self-pay

## 2024-01-30 ENCOUNTER — Ambulatory Visit

## 2024-01-31 ENCOUNTER — Telehealth: Payer: Self-pay

## 2024-01-31 NOTE — Telephone Encounter (Signed)
 I called and spoke to pt regarding her appointment tomorrow with Buelah Manis, NP. I was unable to find a DL for her for CPAP compliance. Pt states she does not use anything to treat her OSA. Pt does still use 2L O2 PRN.

## 2024-02-01 ENCOUNTER — Telehealth: Payer: Self-pay | Admitting: Primary Care

## 2024-02-01 ENCOUNTER — Ambulatory Visit: Payer: Medicare HMO | Admitting: Primary Care

## 2024-02-01 ENCOUNTER — Ambulatory Visit

## 2024-02-01 ENCOUNTER — Encounter: Payer: Self-pay | Admitting: Primary Care

## 2024-02-01 VITALS — BP 136/73 | HR 81 | Temp 96.9°F | Ht 61.0 in | Wt 214.6 lb

## 2024-02-01 DIAGNOSIS — Z87891 Personal history of nicotine dependence: Secondary | ICD-10-CM

## 2024-02-01 DIAGNOSIS — J479 Bronchiectasis, uncomplicated: Secondary | ICD-10-CM

## 2024-02-01 DIAGNOSIS — J449 Chronic obstructive pulmonary disease, unspecified: Secondary | ICD-10-CM

## 2024-02-01 DIAGNOSIS — J4489 Other specified chronic obstructive pulmonary disease: Secondary | ICD-10-CM

## 2024-02-01 DIAGNOSIS — J4550 Severe persistent asthma, uncomplicated: Secondary | ICD-10-CM | POA: Diagnosis not present

## 2024-02-01 DIAGNOSIS — R053 Chronic cough: Secondary | ICD-10-CM | POA: Diagnosis not present

## 2024-02-01 DIAGNOSIS — J40 Bronchitis, not specified as acute or chronic: Secondary | ICD-10-CM | POA: Diagnosis not present

## 2024-02-01 MED ORDER — GUAIFENESIN ER 600 MG PO TB12: 1200.0000 mg | ORAL_TABLET | Freq: Two times a day (BID) | ORAL | 0 refills | Status: AC | PRN

## 2024-02-01 NOTE — Progress Notes (Signed)
 @Patient  ID: Anita Gonzalez, female    DOB: 21-Mar-1946, 78 y.o.   MRN: [id number]  No chief complaint on file.   Referring provider: [name], MD  HPI: 78 year old female, former smoker. Past medical history significant hypertension, COPD/asthma, oxygen dependent, bronchiectasis, ILD, OSA, type 2 diabetes, rheumatoid arthritis, hyperlipidemia, iron deficiency, vit D deficiency. Patient of Dr. Francine Graven.   02/01/2024- Interim hx  Discussed the use of AI scribe software for clinical note transcription with the patient, who gave verbal consent to proceed.  Anita Gonzalez is a 78 year old female with COPD, asthma, and bronchiectasis who presents with breathing difficulties and medication management issues.  She experiences variable breathing difficulties, particularly when walking, which leads to chest congestion and a sensation of mucus that she cannot cough up. She has been using Mucinex, but it seems to stop the cough rather than help expectorate mucus. She is not currently taking any other medication for the cough.  For her COPD and asthma, she uses Advair and Spiriva inhalers. She has been taking Advair two puffs twice daily, which is incorrect as it should be one puff twice daily. Spiriva should be two puffs in the morning, but she has been taking only one puff. There is some confusion about the inhaler she is currently using, as she describes it as gray and not matching the typical Advair inhaler.  She is oxygen dependent, using two liters of oxygen, which is her baseline requirement, and she has a [employer]. She finds the concentrator heavy and has inquired about a smaller one. She uses oxygen primarily when walking or moving about, but not consistently at home unless needed.  She does not smoke, but her daughter and son-in-law, who live with her, smoke outside the house.      Testing:  PFTs from 2017 show FEV1/FVC 89, FEV1 1.03L (63%), FVC 1.16L (55%), TLC 2.84 (61%),  DLCO 68% consistent with a mild restrictive defect and mild diffusion defect.    HR CT Chest in 2017 showed scattered areas of mild cylindrical bronchiectasis and associated thickening of the peribronchovascular interstitium. Inspiratory and expiratory imaging demonstrates air trapping. There is severe collapse of the trachea and mild collapse of the mainstem bronchi on expiratory phase indicative of tracheobronchomalacia.      Allergies  Allergen Reactions   Fish Allergy Anaphylaxis, Hives, Swelling and Other (See Comments)   Peanuts [Peanut Oil] Anaphylaxis, Hives, Itching and Swelling   Penicillins Hives, Itching, Swelling and Other (See Comments)    PATIENT HAS HAD A PCN REACTION WITH IMMEDIATE RASH, FACIAL/TONGUE/THROAT SWELLING, SOB, OR LIGHTHEADEDNESS WITH HYPOTENSION:  #  #  YES  #  #  HAS PT DEVELOPED SEVERE RASH INVOLVING MUCUS MEMBRANES or SKIN NECROSIS: #  #  YES  #  # PATIENT HAS HAD A PCN REACTION THAT REQUIRED HOSPITALIZATION:  #  #  YES  #  #  Has patient had a PCN reaction occurring within the last 10 years: No    Iodinated Contrast Media Hives, Itching and Swelling    SWELLING REACTION UNSPECIFIED    Iodine Hives, Itching and Swelling    Patient reports receiving IV dye in abdominal CT scan without issue   Percocet [Oxycodone-Acetaminophen] Nausea Only, Anxiety and Other (See Comments)    Raised blood pressure, Sweats, nervous,dizziness   2,4-D Dimethylamine Other (See Comments)   Peanut (Diagnostic) Other (See Comments)   Peanut Allergen Powder-Dnfp    Hydrocodone Itching and Nausea Only   Other Other (See  Comments)    UNSPECIFIED REACTION  Hypersensitive to perfumes, colognes, powders, lotions, room sprays   Oxycodone Itching, Nausea Only and Other (See Comments)    Sweats and dizziness   Sulfa Antibiotics     UNSPECIFIED REACTION     Immunization History  Administered Date(s) Administered   Influenza Split 09/15/2015   Influenza, High Dose Seasonal PF  09/13/2020   PFIZER(Purple Top)SARS-COV-2 Vaccination 02/28/2020, 03/20/2020, 09/22/2020    Past Medical History:  Diagnosis Date   Acute kidney injury (HCC) 01/11/2015   Alcohol abuse, in remission    Anemia    Anemia, iron deficiency 01/28/2015   Arthritis    Asthma    Bronchiectasis (HCC) 03/07/2016   Bronchitis    COPD (chronic obstructive pulmonary disease) (HCC)    Diabetes mellitus without complication (HCC)    type 2   DM II (diabetes mellitus, type II), controlled (HCC) 01/06/2015   Dyspnea and respiratory abnormality 04/06/2015   Dysrhythmia    hx of palpitations   E-coli UTI 01/11/2015   Essential hypertension 01/06/2015   GERD (gastroesophageal reflux disease)    Hay fever    Hepatitis    c treated with pills   HLD (hyperlipidemia) 01/06/2015   Hypertension    IBS (irritable bowel syndrome)    ILD (interstitial lung disease) (HCC) 12/11/2016   Leg cramps, sleep related 12/01/2015   Pneumonia    Rheumatoid arthritis (HCC) 1996   Sinus tachycardia 01/11/2015   LT monitor 01/2019: normal sinus rhythm, occ short bursts of PAT, accelerated junctional rhythm, no sig arrhythmias.     Tobacco History: Social History   Tobacco Use  Smoking Status Former   Current packs/day: 0.00   Average packs/day: 0.8 packs/day for 25.0 years (18.8 ttl pk-yrs)   Types: Cigarettes   Start date: 01/11/1986   Quit date: 01/12/2011   Years since quitting: 13.0  Smokeless Tobacco Never   Counseling given: Not Answered   Outpatient Medications Prior to Visit  Medication Sig Dispense Refill   albuterol (PROVENTIL) (2.5 MG/3ML) 0.083% nebulizer solution USE 1 UNIT DOSE(VIAL)VIA NEBULIZER 4 TIMES A DAY AS NEEDED AS DIRECTED 1080 mL 3   albuterol (VENTOLIN HFA) 108 (90 Base) MCG/ACT inhaler INHALE 2 PUFFS BY MOUTH EVERY 6 HRS AS NEEDED 54 g 4   amLODipine (NORVASC) 5 MG tablet TAKE 1 TABLET BY MOUTH IN THE MORNING AND AT BEDTIME. Pt must keep her follow up for future refills. 60 tablet 0    aspirin EC 81 MG tablet Take 1 tablet (81 mg total) by mouth daily. 90 tablet 3   atorvastatin (LIPITOR) 40 MG tablet ONE TABLET BY MOUTH DAILY 90 tablet 0   bumetanide (BUMEX) 2 MG tablet TAKE 1 TABLET BY MOUTH 2 TIMES A WEEK 24 tablet 3   Cholecalciferol (VITAMIN D3) 50 MCG (2000 UT) capsule Take 1 capsule (2,000 Units total) by mouth daily. 100 capsule 3   cyanocobalamin 1000 MCG tablet Take 1 tablet (1,000 mcg total) by mouth every Thursday. 30 tablet 5   cyclobenzaprine (FLEXERIL) 10 MG tablet TAKE ONE TABLET BY MOUTH AT BEDTIME 30 tablet 10   diclofenac Sodium (VOLTAREN) 1 % GEL APPLY 2 G TOPICALLY 4 TIMES A DAY 700 g 3   diphenoxylate-atropine (LOMOTIL) 2.5-0.025 MG tablet Take 1 tablet by mouth 4 (four) times daily as needed for diarrhea or loose stools.      docusate sodium (COLACE) 100 MG capsule Take 100 mg by mouth 2 (two) times daily as needed for mild constipation.  Ferric Maltol (ACCRUFER) 30 MG CAPS Take 1 capsule (30 mg total) by mouth daily. 30 capsule 5   fluticasone (FLONASE) 50 MCG/ACT nasal spray Place 1 spray into both nostrils daily as needed for allergies or rhinitis.     fluticasone-salmeterol (ADVAIR) 500-50 MCG/ACT AEPB INHALE 1 PUFF BY MOUTH 2 TIMES A DAY 60 each 3   folic acid (FOLVITE) 1 MG tablet ONE TABLET BY MOUTH DAILY 90 tablet 0   gabapentin (NEURONTIN) 600 MG tablet Take 600 mg by mouth 3 (three) times daily.     losartan (COZAAR) 50 MG tablet TAKE 1 TABLET BY MOUTH DAILY 90 tablet 3   metFORMIN (GLUCOPHAGE) 500 MG tablet Take 500 mg by mouth 2 (two) times daily.     metFORMIN (GLUCOPHAGE-XR) 500 MG 24 hr tablet Take 500 mg by mouth 2 (two) times daily.     montelukast (SINGULAIR) 10 MG tablet TAKE 1 TABLET BY MOUTH DAILY 90 tablet 3   pantoprazole (PROTONIX) 40 MG tablet ONE TABLET BY MOUTH DAILY 90 tablet 2   SPIRIVA RESPIMAT 2.5 MCG/ACT AERS INHALE 2 PUFFS INTO THE LUNGS DAILY 12 g 0   Tiotropium Bromide Monohydrate (SPIRIVA RESPIMAT) 1.25 MCG/ACT AERS  INHALE 2 PUFFS BY MOUTH ONCE A DAY 12 g 2   Facility-Administered Medications Prior to Visit  Medication Dose Route Frequency Provider Last Rate Last Admin   acetaminophen (TYLENOL) tablet 650 mg  650 mg Oral Once Malachy Mood, MD       diphenhydrAMINE (BENADRYL) capsule 25 mg  25 mg Oral Once Malachy Mood, MD       iron sucrose (VENOFER) injection 200 mg  200 mg Intravenous Once Malachy Mood, MD       Review of Systems  Review of Systems  Constitutional: Negative.   HENT:  Positive for congestion.   Respiratory:  Positive for cough.   Cardiovascular: Negative.    Physical Exam  There were no vitals taken for this visit. Physical Exam Constitutional:      General: She is not in acute distress.    Appearance: Normal appearance. She is not ill-appearing.  HENT:     Head: Normocephalic and atraumatic.  Cardiovascular:     Rate and Rhythm: Normal rate and regular rhythm.  Pulmonary:     Effort: Pulmonary effort is normal.     Breath sounds: Normal breath sounds. No wheezing or rales.  Musculoskeletal:        General: Normal range of motion.  Skin:    General: Skin is warm and dry.  Neurological:     General: No focal deficit present.     Mental Status: She is alert and oriented to person, place, and time. Mental status is at baseline.  Psychiatric:        Mood and Affect: Mood normal.        Behavior: Behavior normal.        Thought Content: Thought content normal.        Judgment: Judgment normal.      Lab Results:  CBC    Component Value Date/Time   WBC 5.9 01/16/2024 1401   WBC 5.5 12/02/2021 1411   RBC 3.01 (L) 01/16/2024 1401   HGB 9.0 (L) 01/16/2024 1401   HGB 10.9 (L) 04/09/2019 1530   HGB 11.9 07/24/2017 1334   HCT 30.0 (L) 01/16/2024 1401   HCT 26.0 (L) 09/20/2023 1315   HCT 37.1 07/24/2017 1334   PLT 277 01/16/2024 1401   PLT 281 04/09/2019 1530  MCV 99.7 01/16/2024 1401   MCV 93 04/09/2019 1530   MCV 96.7 07/24/2017 1334   MCH 29.9 01/16/2024 1401    MCHC 30.0 01/16/2024 1401   RDW 16.7 (H) 01/16/2024 1401   RDW 13.3 04/09/2019 1530   RDW 14.6 (H) 07/24/2017 1334   LYMPHSABS 1.6 01/16/2024 1401   LYMPHSABS 1.8 07/24/2017 1334   MONOABS 0.4 01/16/2024 1401   MONOABS 0.4 07/24/2017 1334   EOSABS 0.2 01/16/2024 1401   EOSABS 0.2 07/24/2017 1334   BASOSABS 0.0 01/16/2024 1401   BASOSABS 0.0 07/24/2017 1334    BMET    Component Value Date/Time   NA 141 01/16/2024 1401   NA 143 07/26/2020 1328   NA 142 02/25/2015 1402   K 4.4 01/16/2024 1401   K 4.0 02/25/2015 1402   CL 104 01/16/2024 1401   CO2 33 (H) 01/16/2024 1401   CO2 30 (H) 02/25/2015 1402   GLUCOSE 124 (H) 01/16/2024 1401   GLUCOSE 89 02/25/2015 1402   BUN 15 01/16/2024 1401   BUN 18 07/26/2020 1328   BUN 11.2 02/25/2015 1402   CREATININE 1.05 (H) 01/16/2024 1401   CREATININE 1.0 02/25/2015 1402   CALCIUM 9.1 01/16/2024 1401   CALCIUM 8.8 02/25/2015 1402   GFRNONAA 55 (L) 01/16/2024 1401   GFRAA 36 (L) 07/26/2020 1328   GFRAA 40 (L) 05/21/2020 1444    BNP No results found for: "BNP"  ProBNP    Component Value Date/Time   PROBNP 753 (H) 07/26/2020 1328    Imaging: No results found.   Assessment & Plan:   1. Bronchiectasis without complication (HCC) (Primary) - DG Chest 2 View; Future - Flutter valve; Future - AMB REFERRAL FOR DME  2. Severe persistent asthma without complication  3. Chronic obstructive pulmonary disease, unspecified COPD type (HCC)     Chronic Obstructive Pulmonary Disease (COPD) with Asthma Oxygen-dependent with incorrect inhaler use. Congestion and difficulty expectorating mucus noted. - Instruct on correct inhaler usage: Advair one puff morning and evening, Spiriva two puffs in the morning. - Provide a flutter valve to aid mucus clearance. - Recommend Mucinex 1200 mg twice daily for 5-7 days. - Order chest x-ray to evaluate chronic cough and bronchiectasis.  Bronchiectasis May contribute to chronic cough. Cough is  non-productive  - Order chest x-ray to evaluate chronic cough and bronchiectasis.  Oxygen Therapy Management Using two liters of oxygen with a heavy portable concentrator. Discussed smaller concentrator options and potential limitations. - Evaluate the possibility of a smaller oxygen concentrator through [employer]. - Conduct a walk test on two liters of oxygen to assess the need for a higher prescription. Patient was able to maintain O2 level >90% on 2 POC, we will attempt to change DME company to [employer] in hopes that she can be provided a smaller/light weight portable oxygen concentrator   Sleep Apnea - Intolerant to CPAP therapy, uses home concentrator for nocturnal oxygen  Palpable Chest Nodule Pea-sized nodule on right chest wall. Chest x-ray may be insufficient. - Recommend follow-up with primary care for evaluation of the chest nodule, possibly with ultrasound or mammogram.      30-40 mins spent on case; > 50% face to face with patient  Glenford Bayley, NP 02/01/2024

## 2024-02-01 NOTE — Telephone Encounter (Signed)
 Patient uses sluticasonepropinate inhaler. She takes it for her COPD. She was taking Advair at one point. She was just notifying Ames Dura NP

## 2024-02-01 NOTE — Patient Instructions (Addendum)
-  CHRONIC OBSTRUCTIVE PULMONARY DISEASE (COPD) WITH ASTHMA: COPD and asthma are conditions that cause breathing difficulties. We have corrected your inhaler usage: Advair should be one puff in the morning and evening, and Spiriva should be two puffs in the morning. We also provided a flutter valve to help clear mucus and recommended Mucinex 1200 mg twice daily for 5-7 days. A chest x-ray has been ordered to evaluate your chronic cough and bronchiectasis.  Please confirm inhalers that you are using with [place]   -BRONCHIECTASIS: Bronchiectasis is a condition where the airways in the lungs are abnormally widened, leading to mucus build-up and chronic cough. We have ordered a chest x-ray to further evaluate this condition.  -OXYGEN THERAPY MANAGEMENT: You are currently using two liters of oxygen with a heavy portable concentrator. We discussed the possibility of a smaller concentrator through [employer] and will conduct a walk test on two liters of oxygen to see if you need a higher prescription.  -SLEEP APNEA: Sleep apnea is a condition where breathing repeatedly stops and starts during sleep. You are intolerant to CPAP therapy and use a home concentrator for nocturnal oxygen. Continue to work on weight loss and avoid sleeping flat on your back.   -PALPABLE CHEST NODULE: A small, pea-sized nodule was found on your right chest wall. We recommend following up with your primary care doctor for further evaluation, possibly with an ultrasound or mammogram.  INSTRUCTIONS: Please follow the corrected inhaler usage instructions: Advair one puff in the morning and evening, and Spiriva two puffs in the morning. Take Mucinex 1200 mg twice daily for 5-7 days. We will conduct a chest x-ray to evaluate your chronic cough and bronchiectasis. Additionally, follow up with your primary care doctor to evaluate the chest nodule, possibly with an ultrasound or mammogram. We will also conduct a walk test on two liters of oxygen to  assess your needs and explore the possibility of a smaller oxygen concentrator through [employer].  Orders: CXR Walk test on 2L POC Flutter valve  Follow-up 6 months with Dr. Francine Graven

## 2024-02-05 ENCOUNTER — Other Ambulatory Visit (HOSPITAL_COMMUNITY): Payer: Self-pay

## 2024-02-05 ENCOUNTER — Encounter: Payer: Self-pay | Admitting: Hematology

## 2024-02-05 ENCOUNTER — Ambulatory Visit

## 2024-02-05 ENCOUNTER — Telehealth: Payer: Self-pay

## 2024-02-05 MED ORDER — TRELEGY ELLIPTA 100-62.5-25 MCG/ACT IN AEPB: 1.0000 | INHALATION_SPRAY | Freq: Every day | RESPIRATORY_TRACT | 6 refills | Status: DC

## 2024-02-05 NOTE — Telephone Encounter (Signed)
 Can you confirm- Is it Fluticasone propionate?

## 2024-02-05 NOTE — Telephone Encounter (Signed)
 Can you do a benefits investigation for  Advair, trelegy or stiolto

## 2024-02-05 NOTE — Telephone Encounter (Signed)
*  sent to office in original request  Patient last filled Advair Diksus 500mg  on 02-18 for a 3 month supply.  Test claims for requested medications are as follows:  Trelegy- $0.00 Stiolto- $0.00 Advair HFA- $0.00

## 2024-02-05 NOTE — Telephone Encounter (Signed)
 Patient last filled Advair Diksus 500mg  on 02-18 for a 3 month supply.  Test claims for requested medications are as follows:  Trelegy- $0.00 Stiolto- $0.00 Advair HFA- $0.00

## 2024-02-05 NOTE — Telephone Encounter (Signed)
 I called and spoke to pt. Pt informed of Beth's note and verbalized understanding. Trelegy was called in to preferred pharmacy at [facility] on [address]. Advair and Spiriva discontinued. NFN

## 2024-02-05 NOTE — Telephone Encounter (Signed)
 I called and spoke to pt. Pt confirmed it is Fluticasone.

## 2024-02-05 NOTE — Telephone Encounter (Signed)
 Please send in Trelegy one puff daily. Let her know this will replace Fluticasone and Spiriva so she should stop those two inhalers. She can still use Albuterol as needed every 4-6 hours   Can you also update med list and remove advair and spiriva

## 2024-02-06 ENCOUNTER — Telehealth: Payer: Self-pay | Admitting: Pulmonary Disease

## 2024-02-06 NOTE — Telephone Encounter (Signed)
 CMN received from [employer] for POC

## 2024-02-07 DIAGNOSIS — M792 Neuralgia and neuritis, unspecified: Secondary | ICD-10-CM | POA: Diagnosis not present

## 2024-02-09 ENCOUNTER — Telehealth: Payer: Self-pay | Admitting: Pulmonary Disease

## 2024-02-09 NOTE — Telephone Encounter (Signed)
 Received notification from after-hours answering service from clinic.  Connected with patient.  She was recently seeing an inhaler switched to Trelegy.  She had questions about what inhalers to be using.  I clarified to use Trelegy 1 puff once a day.  This replaces the Advair and Spiriva she was on previously.  She should stop both of these.  She can use albuterol as needed in addition to the Trelegy 1 puff every day.

## 2024-02-13 ENCOUNTER — Ambulatory Visit

## 2024-02-13 VITALS — BP 124/62 | HR 69 | Temp 98.1°F | Resp 18 | Ht 61.0 in | Wt 209.0 lb

## 2024-02-13 DIAGNOSIS — K922 Gastrointestinal hemorrhage, unspecified: Secondary | ICD-10-CM | POA: Diagnosis not present

## 2024-02-13 DIAGNOSIS — D509 Iron deficiency anemia, unspecified: Secondary | ICD-10-CM

## 2024-02-13 DIAGNOSIS — D500 Iron deficiency anemia secondary to blood loss (chronic): Secondary | ICD-10-CM | POA: Diagnosis not present

## 2024-02-13 MED ORDER — IRON SUCROSE 20 MG/ML IV SOLN
200.0000 mg | Freq: Once | INTRAVENOUS | Status: AC
  Administered 2024-02-13: 200 mg via INTRAVENOUS
  Filled 2024-02-13: qty 10

## 2024-02-13 MED ORDER — DIPHENHYDRAMINE HCL 25 MG PO CAPS
25.0000 mg | ORAL_CAPSULE | Freq: Once | ORAL | Status: AC
  Administered 2024-02-13: 25 mg via ORAL
  Filled 2024-02-13: qty 1

## 2024-02-13 MED ORDER — ACETAMINOPHEN 325 MG PO TABS
650.0000 mg | ORAL_TABLET | Freq: Once | ORAL | Status: DC
  Filled 2024-02-13: qty 2

## 2024-02-13 NOTE — Progress Notes (Signed)
 Diagnosis: Iron Deficiency Anemia  Provider:  Chilton Greathouse MD  Procedure: IV Push  IV Type: Peripheral, IV Location: L Forearm  Venofer (Iron Sucrose), Dose: 200 mg  Post Infusion IV Care: Observation period completed and Peripheral IV Discontinued  Discharge: Condition: Good, Destination: Home . AVS Provided  Performed by:  Loney Hering, LPN

## 2024-02-14 NOTE — Telephone Encounter (Signed)
 Thanks Matt On 3/21 when we sent in Trelegy inhaler and I did specify that this will replace Fluticasone and Spiriva so she should stop those two inhalers.

## 2024-02-14 NOTE — Progress Notes (Signed)
Please let patient know CXR showed clear lungs, no active cardiopulmonary process

## 2024-02-19 ENCOUNTER — Telehealth: Payer: Self-pay

## 2024-02-19 NOTE — Telephone Encounter (Signed)
 Copied from CRM [id number]. Topic: Clinical - Prescription Issue >> Feb 18, 2024  4:49 PM Para March B wrote: Reason for CRM: [facility] regarding the patient's script: Need pulse dose setting. Would like Dr Francine Graven to please circle, initial and date and fax back.  Routing to [place], please advise.

## 2024-02-19 NOTE — Telephone Encounter (Signed)
 CRM was resolved in previous encounter

## 2024-02-19 NOTE — Telephone Encounter (Signed)
 Cmn faxed successfully and signed.

## 2024-02-27 DIAGNOSIS — J449 Chronic obstructive pulmonary disease, unspecified: Secondary | ICD-10-CM | POA: Diagnosis not present

## 2024-02-27 DIAGNOSIS — E611 Iron deficiency: Secondary | ICD-10-CM | POA: Diagnosis not present

## 2024-02-27 DIAGNOSIS — E538 Deficiency of other specified B group vitamins: Secondary | ICD-10-CM | POA: Diagnosis not present

## 2024-02-27 DIAGNOSIS — G894 Chronic pain syndrome: Secondary | ICD-10-CM | POA: Diagnosis not present

## 2024-02-27 DIAGNOSIS — E782 Mixed hyperlipidemia: Secondary | ICD-10-CM | POA: Diagnosis not present

## 2024-02-27 DIAGNOSIS — I119 Hypertensive heart disease without heart failure: Secondary | ICD-10-CM | POA: Diagnosis not present

## 2024-02-27 DIAGNOSIS — E1129 Type 2 diabetes mellitus with other diabetic kidney complication: Secondary | ICD-10-CM | POA: Diagnosis not present

## 2024-02-29 ENCOUNTER — Other Ambulatory Visit: Payer: Self-pay | Admitting: Physician Assistant

## 2024-02-29 DIAGNOSIS — Z13820 Encounter for screening for osteoporosis: Secondary | ICD-10-CM

## 2024-02-29 DIAGNOSIS — Z1231 Encounter for screening mammogram for malignant neoplasm of breast: Secondary | ICD-10-CM

## 2024-03-11 ENCOUNTER — Ambulatory Visit

## 2024-03-17 DIAGNOSIS — M25551 Pain in right hip: Secondary | ICD-10-CM | POA: Diagnosis not present

## 2024-03-17 DIAGNOSIS — M25562 Pain in left knee: Secondary | ICD-10-CM | POA: Diagnosis not present

## 2024-03-17 DIAGNOSIS — M25552 Pain in left hip: Secondary | ICD-10-CM | POA: Diagnosis not present

## 2024-03-17 DIAGNOSIS — G8929 Other chronic pain: Secondary | ICD-10-CM | POA: Diagnosis not present

## 2024-03-17 DIAGNOSIS — M25561 Pain in right knee: Secondary | ICD-10-CM | POA: Diagnosis not present

## 2024-03-18 DIAGNOSIS — Z0100 Encounter for examination of eyes and vision without abnormal findings: Secondary | ICD-10-CM | POA: Diagnosis not present

## 2024-03-18 DIAGNOSIS — H2513 Age-related nuclear cataract, bilateral: Secondary | ICD-10-CM | POA: Diagnosis not present

## 2024-03-18 DIAGNOSIS — E119 Type 2 diabetes mellitus without complications: Secondary | ICD-10-CM | POA: Diagnosis not present

## 2024-03-19 ENCOUNTER — Encounter: Payer: Self-pay | Admitting: Hematology

## 2024-03-19 ENCOUNTER — Inpatient Hospital Stay (HOSPITAL_BASED_OUTPATIENT_CLINIC_OR_DEPARTMENT_OTHER): Payer: Medicare HMO | Admitting: Hematology

## 2024-03-19 ENCOUNTER — Other Ambulatory Visit: Payer: Self-pay | Admitting: Hematology

## 2024-03-19 ENCOUNTER — Inpatient Hospital Stay: Payer: Medicare HMO | Attending: Hematology

## 2024-03-19 VITALS — BP 140/58 | HR 72 | Temp 98.2°F | Resp 20 | Ht 61.0 in | Wt 202.0 lb

## 2024-03-19 DIAGNOSIS — M1611 Unilateral primary osteoarthritis, right hip: Secondary | ICD-10-CM | POA: Insufficient documentation

## 2024-03-19 DIAGNOSIS — K5521 Angiodysplasia of colon with hemorrhage: Secondary | ICD-10-CM | POA: Insufficient documentation

## 2024-03-19 DIAGNOSIS — M170 Bilateral primary osteoarthritis of knee: Secondary | ICD-10-CM | POA: Diagnosis not present

## 2024-03-19 DIAGNOSIS — I10 Essential (primary) hypertension: Secondary | ICD-10-CM | POA: Insufficient documentation

## 2024-03-19 DIAGNOSIS — J449 Chronic obstructive pulmonary disease, unspecified: Secondary | ICD-10-CM | POA: Diagnosis not present

## 2024-03-19 DIAGNOSIS — E119 Type 2 diabetes mellitus without complications: Secondary | ICD-10-CM | POA: Insufficient documentation

## 2024-03-19 DIAGNOSIS — M545 Low back pain, unspecified: Secondary | ICD-10-CM | POA: Diagnosis not present

## 2024-03-19 DIAGNOSIS — E611 Iron deficiency: Secondary | ICD-10-CM

## 2024-03-19 DIAGNOSIS — D500 Iron deficiency anemia secondary to blood loss (chronic): Secondary | ICD-10-CM | POA: Insufficient documentation

## 2024-03-19 LAB — CBC WITH DIFFERENTIAL (CANCER CENTER ONLY)
Abs Immature Granulocytes: 0.01 10*3/uL (ref 0.00–0.07)
Basophils Absolute: 0 10*3/uL (ref 0.0–0.1)
Basophils Relative: 0 %
Eosinophils Absolute: 0.2 10*3/uL (ref 0.0–0.5)
Eosinophils Relative: 3 %
HCT: 31.7 % — ABNORMAL LOW (ref 36.0–46.0)
Hemoglobin: 9.9 g/dL — ABNORMAL LOW (ref 12.0–15.0)
Immature Granulocytes: 0 %
Lymphocytes Relative: 21 %
Lymphs Abs: 1.2 10*3/uL (ref 0.7–4.0)
MCH: 30.7 pg (ref 26.0–34.0)
MCHC: 31.2 g/dL (ref 30.0–36.0)
MCV: 98.1 fL (ref 80.0–100.0)
Monocytes Absolute: 0.3 10*3/uL (ref 0.1–1.0)
Monocytes Relative: 6 %
Neutro Abs: 4.1 10*3/uL (ref 1.7–7.7)
Neutrophils Relative %: 70 %
Platelet Count: 317 10*3/uL (ref 150–400)
RBC: 3.23 MIL/uL — ABNORMAL LOW (ref 3.87–5.11)
RDW: 14.7 % (ref 11.5–15.5)
WBC Count: 5.8 10*3/uL (ref 4.0–10.5)
nRBC: 0 % (ref 0.0–0.2)

## 2024-03-19 LAB — VITAMIN B12: Vitamin B-12: 536 pg/mL (ref 180–914)

## 2024-03-19 LAB — CMP (CANCER CENTER ONLY)
ALT: 12 U/L (ref 0–44)
AST: 13 U/L — ABNORMAL LOW (ref 15–41)
Albumin: 4.1 g/dL (ref 3.5–5.0)
Alkaline Phosphatase: 75 U/L (ref 38–126)
Anion gap: 7 (ref 5–15)
BUN: 15 mg/dL (ref 8–23)
CO2: 32 mmol/L (ref 22–32)
Calcium: 9.2 mg/dL (ref 8.9–10.3)
Chloride: 103 mmol/L (ref 98–111)
Creatinine: 1.18 mg/dL — ABNORMAL HIGH (ref 0.44–1.00)
GFR, Estimated: 48 mL/min — ABNORMAL LOW (ref >60)
Glucose, Bld: 119 mg/dL — ABNORMAL HIGH (ref 70–99)
Potassium: 4 mmol/L (ref 3.5–5.1)
Sodium: 142 mmol/L (ref 135–145)
Total Bilirubin: 0.2 mg/dL (ref 0.0–1.2)
Total Protein: 6.6 g/dL (ref 6.5–8.1)

## 2024-03-19 LAB — FERRITIN: Ferritin: 62 ng/mL (ref 11–307)

## 2024-03-19 LAB — IRON AND IRON BINDING CAPACITY (CC-WL,HP ONLY)
Iron: 37 ug/dL (ref 28–170)
Saturation Ratios: 12 % (ref 10.4–31.8)
TIBC: 302 ug/dL (ref 250–450)
UIBC: 265 ug/dL (ref 148–442)

## 2024-03-19 MED ORDER — ACCRUFER 30 MG PO CAPS
30.0000 mg | ORAL_CAPSULE | Freq: Every day | ORAL | 5 refills | Status: DC
Start: 2024-03-19 — End: 2024-05-08

## 2024-03-19 NOTE — Assessment & Plan Note (Signed)
from GI AVM bleeding  -She has moderate anemia with baseline hemoglobin in 8-9 range, MCV normal, ferritin 16, serum iron 13, URBC elevated at 430, iron saturation 3%, this is consistent with iron deficient anemia -Anemia has been several years, likely secondary to slow GI bleeding. Her stool OB was positive. Colonoscopy showed AVM, last done in 03/2015. Her 03/15/18 Upper Endoscopy with Dr Elnoria Howard showed no evidence of bleeding.  -Her baseline SPEP and UPEP were negative.  -She also may have a component of anemia of chronic disease secondary to kidney disease and rheumatoid arthritis -She has received IV Feraheme as needed since 2016, most recently 02/2021. She responded well and continues oral iron BID.  -She continues to have progressively worse fatigue. She denies GI bleeding or overt blood loss. Labs reviewed, Hg 9. CMP and iron panel still pending. If her iron is low, I will give IV Ferahame in the near future. -Continue oral B12, Folic acid, iron ferrous sulfate BID daily. Will set up iv feraheme if ferritin<100  -Given symptoms, will monitor with labs and f/u every 2-3 months

## 2024-03-19 NOTE — Progress Notes (Signed)
 [facility]   [phone] [phone]   Clinic Follow up Note   Patient Care Team: Tretha Fu, MD as PCP - General (Internal Medicine) Elmyra Haggard, MD as PCP - Cardiology (Cardiology) Delbert Feathers, MD as Referring Physician (Specialist) Delbert Feathers, MD as Referring Physician (Specialist) Alvis Jourdain, MD as Consulting Physician (Gastroenterology) Sara Culver., MD as Referring Physician (Anesthesiology) Radene Buffalo, DPM as Consulting Physician (Podiatry) Sonja [place], MD as Attending Physician (Hematology and Oncology) Mannam, Praveen, MD as Consulting Physician (Pulmonary Disease)  Date of Service:  03/19/2024  CHIEF COMPLAINT: f/u of anemia   CURRENT THERAPY:  Iv iron  as needed  Oral B12   Oncology History   Anemia, iron  deficiency from GI AVM bleeding  -She has moderate anemia with baseline hemoglobin in 8-9 range, MCV normal, ferritin 16, serum iron  13, URBC elevated at 430, iron  saturation 3%, this is consistent with iron  deficient anemia -Anemia has been several years, likely secondary to slow GI bleeding. Her stool OB was positive. Colonoscopy showed AVM, last done in 03/2015. Her 03/15/18 Upper Endoscopy with Dr Nickey Barn showed no evidence of bleeding.  -Her baseline SPEP and UPEP were negative.  -She also may have a component of anemia of chronic disease secondary to kidney disease and rheumatoid arthritis -She has received IV Feraheme  as needed since 2016, most recently 02/2021. She responded well and continues oral iron  BID.  -She continues to have progressively worse fatigue. She denies GI bleeding or overt blood loss. Labs reviewed, Hg 9. CMP and iron  panel still pending. If her iron  is low, I will give IV Ferahame in the near future. -Continue oral B12, Folic acid , iron  ferrous sulfate  BID daily. Will set up iv feraheme  if ferritin<100  -Given symptoms, will monitor with labs and f/u every 2-3 months    Assessment & Plan Anemia Chronic anemia with low iron  and B12 levels. Current hemoglobin is 9.9, improved from 8.4 six months ago and 9 two months ago. Anemia is mild and does not require transfusion. IV iron  has improved hemoglobin levels. Fatigue is likely multifactorial, including anemia, COPD, arthritis, and interstitial lung disease. - Check current iron  levels to determine if additional iron  is needed. - Verify insurance coverage for prescription iron  pill and arrange mailing if covered. - Follow up in six months to monitor blood counts. - Encourage exercise to improve energy levels, including chair exercises or stationary bike.  Chronic Obstructive Pulmonary Disease (COPD) -f/u with pulmonary   Osteoarthritis Chronic pain in knees, right hip, and lower back due to osteoarthritis. Under pain management care. Knee injections were ineffective and caused pain. - Continue follow-up with pain management specialist. - Encourage exercise to prevent muscle loss, including chair exercises or stationary bike.  Diabetes Mellitus Contributes to fatigue. No specific management discussed in this encounter.  Hypertension No specific management discussed in this encounter.   Plan -lab reviewed, anemia has improved with iv iron  -lab every 2 months, iv venofer  if ferritin<100 -f/u in 6 months    Discussed the use of AI scribe software for clinical note transcription with the patient, who gave verbal consent to proceed.  History of Present Illness Anita Gonzalez is a 78 year old female with anemia who presents for follow-up.  She experiences persistent fatigue and lack of energy, feeling constantly tired and inactive. Despite this, she maintains self-care and attends appointments using transportation.  Her anemia is closely monitored, with hemoglobin levels improving from 8.4 six months  ago to 9.9 today. She received IV iron  therapy, with the last dose a month ago, and had four doses in  February. She takes B12 supplements and ferrous sulfate  but did not pick up the prescription iron  pill due to cost concerns.  She experiences chronic pain in her knees, right hip, and lower back, attributed to arthritis. She uses a rollator and cane for mobility at home. She has attended pain management appointments and previously received knee injections, which provided only temporary relief.  Her past medical history includes COPD, asthma, diabetes, hypertension, and interstitial lung disease. No current alcohol use.     All other systems were reviewed with the patient and are negative.  MEDICAL HISTORY:  Past Medical History:  Diagnosis Date   Acute kidney injury (HCC) 01/11/2015   Alcohol abuse, in remission    Anemia    Anemia, iron  deficiency 01/28/2015   Arthritis    Asthma    Bronchiectasis (HCC) 03/07/2016   Bronchitis    COPD (chronic obstructive pulmonary disease) (HCC)    Diabetes mellitus without complication (HCC)    type 2   DM II (diabetes mellitus, type II), controlled (HCC) 01/06/2015   Dyspnea and respiratory abnormality 04/06/2015   Dysrhythmia    hx of palpitations   E-coli UTI 01/11/2015   Essential hypertension 01/06/2015   GERD (gastroesophageal reflux disease)    Hay fever    Hepatitis    c treated with pills   HLD (hyperlipidemia) 01/06/2015   Hypertension    IBS (irritable bowel syndrome)    ILD (interstitial lung disease) (HCC) 12/11/2016   Leg cramps, sleep related 12/01/2015   Pneumonia    Rheumatoid arthritis (HCC) 1996   Sinus tachycardia 01/11/2015   LT monitor 01/2019: normal sinus rhythm, occ short bursts of PAT, accelerated junctional rhythm, no sig arrhythmias.     SURGICAL HISTORY: Past Surgical History:  Procedure Laterality Date   ABDOMINAL HYSTERECTOMY     BALLOON DILATION N/A 03/15/2018   Procedure: BALLOON DILATION;  Surgeon: Alvis Jourdain, MD;  Location: [facility];  Service: Endoscopy;  Laterality: N/A;   CARPAL TUNNEL RELEASE Left  04/19/2018   Procedure: CARPAL TUNNEL RELEASE;  Surgeon: Audie Bleacher, MD;  Location: [facility] OR;  Service: Neurosurgery;  Laterality: Left;  CARPAL TUNNEL RELEASE   CESAREAN SECTION     COLONOSCOPY WITH PROPOFOL  N/A 04/02/2015   Procedure: COLONOSCOPY WITH PROPOFOL ;  Surgeon: Alvis Jourdain, MD;  Location: [facility];  Service: Endoscopy;  Laterality: N/A;   ESOPHAGOGASTRODUODENOSCOPY (EGD) WITH PROPOFOL  N/A 04/02/2015   Procedure: ESOPHAGOGASTRODUODENOSCOPY (EGD) WITH PROPOFOL ;  Surgeon: Alvis Jourdain, MD;  Location: [facility];  Service: Endoscopy;  Laterality: N/A;   ESOPHAGOGASTRODUODENOSCOPY (EGD) WITH PROPOFOL  N/A 03/15/2018   Procedure: ESOPHAGOGASTRODUODENOSCOPY (EGD) WITH PROPOFOL ;  Surgeon: Alvis Jourdain, MD;  Location: [facility];  Service: Endoscopy;  Laterality: N/A;   HEMORRHOID SURGERY     HERNIA REPAIR     HERNIA REPAIR      I have reviewed the social history and family history with the patient and they are unchanged from previous note.  ALLERGIES:  is allergic to fish allergy; peanuts [peanut oil]; penicillins; iodinated contrast media; iodine; percocet [oxycodone-acetaminophen ]; 2,4-d dimethylamine; peanut (diagnostic); peanut allergen powder-dnfp; hydrocodone; other; oxycodone; and sulfa antibiotics.  MEDICATIONS:  Current Outpatient Medications  Medication Sig Dispense Refill   albuterol  (PROVENTIL ) (2.5 MG/3ML) 0.083% nebulizer solution USE 1 UNIT DOSE(VIAL)VIA NEBULIZER 4 TIMES A DAY AS NEEDED AS DIRECTED 1080 mL 3   albuterol  (VENTOLIN  HFA) 108 (90  Base) MCG/ACT inhaler INHALE 2 PUFFS BY MOUTH EVERY 6 HRS AS NEEDED 54 g 4   amLODipine  (NORVASC ) 5 MG tablet TAKE 1 TABLET BY MOUTH IN THE MORNING AND AT BEDTIME. Pt must keep her follow up for future refills. 60 tablet 0   aspirin  EC 81 MG tablet Take 1 tablet (81 mg total) by mouth daily. 90 tablet 3   atorvastatin  (LIPITOR) 40 MG tablet ONE TABLET BY MOUTH DAILY 90 tablet 0   bumetanide  (BUMEX ) 2 MG tablet TAKE 1 TABLET BY  MOUTH 2 TIMES A WEEK 24 tablet 3   cyanocobalamin  1000 MCG tablet Take 1 tablet (1,000 mcg total) by mouth every Thursday. 30 tablet 5   cyclobenzaprine  (FLEXERIL ) 10 MG tablet TAKE ONE TABLET BY MOUTH AT BEDTIME 30 tablet 10   diclofenac  Sodium (VOLTAREN ) 1 % GEL APPLY 2 G TOPICALLY 4 TIMES A DAY 700 g 3   diphenoxylate-atropine (LOMOTIL) 2.5-0.025 MG tablet Take 1 tablet by mouth 4 (four) times daily as needed for diarrhea or loose stools.      docusate sodium (COLACE) 100 MG capsule Take 100 mg by mouth 2 (two) times daily as needed for mild constipation.     fluticasone  (FLONASE) 50 MCG/ACT nasal spray Place 1 spray into both nostrils daily as needed for allergies or rhinitis.     Fluticasone -Umeclidin-Vilant (TRELEGY ELLIPTA ) 100-62.5-25 MCG/ACT AEPB Inhale 1 puff into the lungs daily. 60 each 6   folic acid  (FOLVITE ) 1 MG tablet ONE TABLET BY MOUTH DAILY 90 tablet 0   gabapentin (NEURONTIN) 600 MG tablet Take 600 mg by mouth 3 (three) times daily.     guaiFENesin  (MUCINEX ) 600 MG 12 hr tablet Take 2 tablets (1,200 mg total) by mouth 2 (two) times daily as needed for cough or to loosen phlegm. 30 tablet 0   losartan (COZAAR) 50 MG tablet TAKE 1 TABLET BY MOUTH DAILY 90 tablet 3   metFORMIN (GLUCOPHAGE) 500 MG tablet Take 500 mg by mouth 2 (two) times daily.     montelukast  (SINGULAIR ) 10 MG tablet TAKE 1 TABLET BY MOUTH DAILY 90 tablet 3   pantoprazole  (PROTONIX ) 40 MG tablet ONE TABLET BY MOUTH DAILY 90 tablet 2   Cholecalciferol (VITAMIN D3) 50 MCG (2000 UT) capsule Take 1 capsule (2,000 Units total) by mouth daily. (Patient not taking: Reported on 03/19/2024) 100 capsule 3   Ferric Maltol  (ACCRUFER ) 30 MG CAPS Take 1 capsule (30 mg total) by mouth daily. 30 capsule 5   No current facility-administered medications for this visit.   Facility-Administered Medications Ordered in Other Visits  Medication Dose Route Frequency Provider Last Rate Last Admin   [MAR Hold] acetaminophen  (TYLENOL )  tablet 650 mg  650 mg Oral Once Sonja Herbst, MD       [facility] Hold] diphenhydrAMINE  (BENADRYL ) capsule 25 mg  25 mg Oral Once Sonja Tescott, MD       [facility] Hold] iron  sucrose (VENOFER ) injection 200 mg  200 mg Intravenous Once Sonja [place], MD        PHYSICAL EXAMINATION: ECOG PERFORMANCE STATUS: 2 - Symptomatic, <50% confined to bed  Vitals:   03/19/24 1309  BP: (!) 140/58  Pulse: 72  Resp: 20  Temp: 98.2 F (36.8 C)  SpO2: 95%   Wt Readings from Last 3 Encounters:  03/19/24 202 lb (91.6 kg)  02/13/24 209 lb (94.8 kg)  02/01/24 214 lb 9.6 oz (97.3 kg)     GENERAL:alert, no distress and comfortable SKIN: skin color, texture, turgor are  normal, no rashes or significant lesions EYES: normal, Conjunctiva are pink and non-injected, sclera clear Musculoskeletal:no cyanosis of digits and no clubbing  NEURO: alert & oriented x 3 with fluent speech, no focal motor/sensory deficits  Physical Exam    LABORATORY DATA:  I have reviewed the data as listed    Latest Ref Rng & Units 03/19/2024   12:34 PM 01/16/2024    2:01 PM 09/20/2023    1:16 PM  CBC  WBC 4.0 - 10.5 K/uL 5.8  5.9  4.8   Hemoglobin 12.0 - 15.0 g/dL 9.9  9.0  8.4   Hematocrit 36.0 - 46.0 % 31.7  30.0  27.4   Platelets 150 - 400 K/uL 317  277  293         Latest Ref Rng & Units 03/19/2024   12:34 PM 01/16/2024    2:01 PM 09/20/2023    1:16 PM  CMP  Glucose 70 - 99 mg/dL 161  096  045   BUN 8 - 23 mg/dL 15  15  21    Creatinine 0.44 - 1.00 mg/dL 4.09  8.11  9.14   Sodium 135 - 145 mmol/L 142  141  141   Potassium 3.5 - 5.1 mmol/L 4.0  4.4  4.5   Chloride 98 - 111 mmol/L 103  104  103   CO2 22 - 32 mmol/L 32  33  33   Calcium  8.9 - 10.3 mg/dL 9.2  9.1  9.7   Total Protein 6.5 - 8.1 g/dL 6.6  6.8  6.9   Total Bilirubin 0.0 - 1.2 mg/dL 0.2  0.2  0.2   Alkaline Phos 38 - 126 U/L 75  84  91   AST 15 - 41 U/L 13  14  14    ALT 0 - 44 U/L 12  11  10        RADIOGRAPHIC STUDIES: I have personally reviewed the radiological images  as listed and agreed with the findings in the report. No results found.    No orders of the defined types were placed in this encounter.  All questions were answered. The patient knows to call the clinic with any problems, questions or concerns. No barriers to learning was detected. The total time spent in the appointment was 20 minutes, including review of chart and various tests results, discussions about plan of care and coordination of care plan     Sonja Rector, MD 03/19/2024

## 2024-03-20 DIAGNOSIS — N1831 Chronic kidney disease, stage 3a: Secondary | ICD-10-CM | POA: Diagnosis not present

## 2024-03-20 DIAGNOSIS — I509 Heart failure, unspecified: Secondary | ICD-10-CM | POA: Diagnosis not present

## 2024-03-20 DIAGNOSIS — E1122 Type 2 diabetes mellitus with diabetic chronic kidney disease: Secondary | ICD-10-CM | POA: Diagnosis not present

## 2024-03-20 DIAGNOSIS — I130 Hypertensive heart and chronic kidney disease with heart failure and stage 1 through stage 4 chronic kidney disease, or unspecified chronic kidney disease: Secondary | ICD-10-CM | POA: Diagnosis not present

## 2024-03-20 LAB — FOLATE RBC
Folate, Hemolysate: >620 ng/mL
Folate, RBC: >1902 ng/mL (ref >498)
Hematocrit: 32.6 % — ABNORMAL LOW (ref 34.0–46.6)

## 2024-03-24 ENCOUNTER — Telehealth: Payer: Self-pay

## 2024-03-24 ENCOUNTER — Encounter (HOSPITAL_COMMUNITY): Payer: Self-pay

## 2024-03-24 ENCOUNTER — Other Ambulatory Visit: Payer: Self-pay

## 2024-03-24 NOTE — Telephone Encounter (Addendum)
 Called patient to relay message below as per Dr.Feng, patient voiced full understanding and had no further questions at this time. Made the patient aware that [facility] would be contacting her for app.    ----- Message from Sonja [place]ills sent at 03/21/2024  2:51 PM EDT ----- Please let her know iron  is below the goal, please schedule iv venofer  200mg X4 in next month, thanks  Sonja Kingston

## 2024-03-25 ENCOUNTER — Ambulatory Visit

## 2024-03-31 ENCOUNTER — Ambulatory Visit
Admission: RE | Admit: 2024-03-31 | Discharge: 2024-03-31 | Disposition: A | Discharge status: Home / Self Care | Source: Ambulatory Visit | Attending: Physician Assistant | Admitting: Physician Assistant

## 2024-03-31 DIAGNOSIS — Z1231 Encounter for screening mammogram for malignant neoplasm of breast: Secondary | ICD-10-CM | POA: Diagnosis not present

## 2024-04-01 ENCOUNTER — Ambulatory Visit
Admission: RE | Admit: 2024-04-01 | Discharge: 2024-04-01 | Disposition: A | Discharge status: Home / Self Care | Source: Ambulatory Visit | Attending: Physical Medicine & Rehabilitation | Admitting: Physical Medicine & Rehabilitation

## 2024-04-01 ENCOUNTER — Other Ambulatory Visit: Payer: Self-pay | Admitting: Physical Medicine & Rehabilitation

## 2024-04-01 DIAGNOSIS — M25552 Pain in left hip: Secondary | ICD-10-CM

## 2024-04-01 DIAGNOSIS — M25561 Pain in right knee: Secondary | ICD-10-CM

## 2024-04-01 DIAGNOSIS — M25562 Pain in left knee: Secondary | ICD-10-CM | POA: Diagnosis not present

## 2024-04-01 DIAGNOSIS — M170 Bilateral primary osteoarthritis of knee: Secondary | ICD-10-CM | POA: Diagnosis not present

## 2024-04-01 DIAGNOSIS — M25551 Pain in right hip: Secondary | ICD-10-CM | POA: Diagnosis not present

## 2024-04-01 DIAGNOSIS — M160 Bilateral primary osteoarthritis of hip: Secondary | ICD-10-CM | POA: Diagnosis not present

## 2024-04-08 DIAGNOSIS — G894 Chronic pain syndrome: Secondary | ICD-10-CM | POA: Diagnosis not present

## 2024-04-08 DIAGNOSIS — R002 Palpitations: Secondary | ICD-10-CM | POA: Diagnosis not present

## 2024-04-08 DIAGNOSIS — E1122 Type 2 diabetes mellitus with diabetic chronic kidney disease: Secondary | ICD-10-CM | POA: Diagnosis not present

## 2024-04-08 DIAGNOSIS — N1831 Chronic kidney disease, stage 3a: Secondary | ICD-10-CM | POA: Diagnosis not present

## 2024-04-08 DIAGNOSIS — D509 Iron deficiency anemia, unspecified: Secondary | ICD-10-CM | POA: Diagnosis not present

## 2024-04-09 ENCOUNTER — Other Ambulatory Visit: Payer: Self-pay

## 2024-04-15 ENCOUNTER — Ambulatory Visit (INDEPENDENT_AMBULATORY_CARE_PROVIDER_SITE_OTHER): Admitting: Podiatry

## 2024-04-15 DIAGNOSIS — D509 Iron deficiency anemia, unspecified: Secondary | ICD-10-CM | POA: Diagnosis not present

## 2024-04-15 DIAGNOSIS — Z91198 Patient's noncompliance with other medical treatment and regimen for other reason: Secondary | ICD-10-CM

## 2024-04-15 DIAGNOSIS — E1122 Type 2 diabetes mellitus with diabetic chronic kidney disease: Secondary | ICD-10-CM | POA: Diagnosis not present

## 2024-04-15 DIAGNOSIS — Z0001 Encounter for general adult medical examination with abnormal findings: Secondary | ICD-10-CM | POA: Diagnosis not present

## 2024-04-15 DIAGNOSIS — J302 Other seasonal allergic rhinitis: Secondary | ICD-10-CM | POA: Diagnosis not present

## 2024-04-15 DIAGNOSIS — N1831 Chronic kidney disease, stage 3a: Secondary | ICD-10-CM | POA: Diagnosis not present

## 2024-04-15 DIAGNOSIS — E782 Mixed hyperlipidemia: Secondary | ICD-10-CM | POA: Diagnosis not present

## 2024-04-16 DIAGNOSIS — G8929 Other chronic pain: Secondary | ICD-10-CM | POA: Diagnosis not present

## 2024-04-16 DIAGNOSIS — M545 Low back pain, unspecified: Secondary | ICD-10-CM | POA: Diagnosis not present

## 2024-04-16 DIAGNOSIS — M25511 Pain in right shoulder: Secondary | ICD-10-CM | POA: Diagnosis not present

## 2024-04-16 DIAGNOSIS — M25551 Pain in right hip: Secondary | ICD-10-CM | POA: Diagnosis not present

## 2024-04-16 DIAGNOSIS — M25561 Pain in right knee: Secondary | ICD-10-CM | POA: Diagnosis not present

## 2024-04-16 DIAGNOSIS — M25562 Pain in left knee: Secondary | ICD-10-CM | POA: Diagnosis not present

## 2024-04-16 DIAGNOSIS — M25552 Pain in left hip: Secondary | ICD-10-CM | POA: Diagnosis not present

## 2024-04-20 NOTE — Progress Notes (Signed)
 1. Failure to attend appointment with reason given    Appointment canceled by patient.

## 2024-04-27 NOTE — Progress Notes (Deleted)
 OFFICE NOTE:    Date:  04/27/2024  ID:  Anita Gonzalez, DOB 08-11-1946, MRN [id number] PCP: Tretha Fu, MD  [facility] Providers Cardiologist:  Ola Berger, MD { Click to update primary MD,subspecialty MD or APP then REFRESH:1}      Patient Profile:  Coronary artery disease (noted on chest CT in 7/17) Myoview  in 2018: no ischemia  Mitral Stenosis  TTE 04/13/15: EF 60-65, normal wall motion, grade 1 diastolic dysfunction, mild LAE  TTE 07/27/21: dynamic LV gradient 33 mmHg (49 mmHg w Valsalva), EF > 75, no RWMA, Gr 1 DD, NL RVSF, mild to mod MS (mean 5 mmHg) Aortic atherosclerosis (CT in 7/17) Carotid US  06/22/23: Bilat ICA 1-39  Diabetes mellitus  Hypertension  Hyperlipidemia  Chronic Obstructive Pulmonary Disease on O2 Dr. Diania Fortes Bronchiectasis ILD (Interstitial Lung Disease)  Junctional bradycardia >> beta-blocker DC'd, CCB DC'd Monitor 12/2018: accelerated junctional rhythm  Monitor 09/2019: Sinus rhythm; heart rate 34-111 (average heart rate 66); occasional PAC, rare PVC; no pauses  Monitor 07/2021: NSR, Rare PACs, PVCs; short bursts of SVT Anemia Chronic kidney disease      Discussed the use of AI scribe software for clinical note transcription with the patient, who gave verbal consent to proceed. History of Present Illness Anita Gonzalez is a 78 y.o. female who returns for follow up of CAD, MS, HTN. She was last seen in 04/2023 by Lovette Rud, PA-C.   ROS-See HPI***    Studies Reviewed:      *** Results  Risk Assessment/Calculations: {Does this patient have ATRIAL FIBRILLATION?[phone]} No BP recorded.  {Refresh Note OR Click here to enter BP  :1}***      Physical Exam:  VS:  There were no vitals taken for this visit.   Wt Readings from Last 3 Encounters:  03/19/24 202 lb (91.6 kg)  02/13/24 209 lb (94.8 kg)  02/01/24 214 lb 9.6 oz (97.3 kg)    Physical Exam***     Assessment and Plan:    Assessment & Plan Coronary artery disease  involving native coronary artery of native heart without angina pectoris  Nonrheumatic mitral valve stenosis  Junctional rhythm  Essential hypertension  Chronic obstructive pulmonary disease, unspecified COPD type (HCC)  Assessment and Plan Assessment & Plan  {1. Junctional rhythm Her ECG again demonstrates junctional rhythm.  Her heart rates in the 70s.  She has not had syncope.  I reviewed her case today with Dr. Avanell Bob.  She also saw the patient.  We will stop her verapamil .  We will increase her amlodipine  to better control her blood pressure.  She will see Dr. Avanell Bob back in a few weeks to recheck her ECG.   2. Coronary artery disease involving native coronary artery of native heart without angina pectoris No anginal symptoms.  Continue atorvastatin , aspirin .   3. Essential hypertension The patient's blood pressure is controlled on her current regimen.  As noted, verapamil  will be stopped due to junctional rhythm.  We will increase her amlodipine  to 5 mg twice daily to control her blood pressure.   4. ILD (interstitial lung disease) (HCC) 5. Shortness of breath She has questionable rales versus dry crackles on exam.  I cannot appreciate JVD.  Her weights seem to be stable.  She does not have lower extremity swelling.  I will obtain a BMET, BNP as well as a chest x-ray.  If she has edema on her chest x-ray or significant elevated BNP, I will obtain a follow-up echocardiogram.  I have also asked her to contact her pulmonologist to arrange follow-up as it has been several years since she was last seen.      :1}    {Are you ordering a CV Procedure (e.g. stress test, cath, DCCV, TEE, etc)?   Press F2        :[id number]}  Dispo:  No follow-ups on file.  Signed, Marlyse Single, PA-C

## 2024-04-28 ENCOUNTER — Ambulatory Visit: Admitting: Physician Assistant

## 2024-04-28 DIAGNOSIS — J449 Chronic obstructive pulmonary disease, unspecified: Secondary | ICD-10-CM

## 2024-04-28 DIAGNOSIS — I342 Nonrheumatic mitral (valve) stenosis: Secondary | ICD-10-CM

## 2024-04-28 DIAGNOSIS — I2510 Atherosclerotic heart disease of native coronary artery without angina pectoris: Secondary | ICD-10-CM

## 2024-04-28 DIAGNOSIS — I498 Other specified cardiac arrhythmias: Secondary | ICD-10-CM

## 2024-04-28 DIAGNOSIS — I10 Essential (primary) hypertension: Secondary | ICD-10-CM

## 2024-04-30 DIAGNOSIS — E7800 Pure hypercholesterolemia, unspecified: Secondary | ICD-10-CM | POA: Diagnosis not present

## 2024-04-30 DIAGNOSIS — Z79899 Other long term (current) drug therapy: Secondary | ICD-10-CM | POA: Diagnosis not present

## 2024-04-30 DIAGNOSIS — D539 Nutritional anemia, unspecified: Secondary | ICD-10-CM | POA: Diagnosis not present

## 2024-04-30 DIAGNOSIS — R5383 Other fatigue: Secondary | ICD-10-CM | POA: Diagnosis not present

## 2024-04-30 DIAGNOSIS — M170 Bilateral primary osteoarthritis of knee: Secondary | ICD-10-CM | POA: Diagnosis not present

## 2024-04-30 DIAGNOSIS — Z131 Encounter for screening for diabetes mellitus: Secondary | ICD-10-CM | POA: Diagnosis not present

## 2024-04-30 DIAGNOSIS — R0602 Shortness of breath: Secondary | ICD-10-CM | POA: Diagnosis not present

## 2024-04-30 DIAGNOSIS — M129 Arthropathy, unspecified: Secondary | ICD-10-CM | POA: Diagnosis not present

## 2024-04-30 DIAGNOSIS — Z1159 Encounter for screening for other viral diseases: Secondary | ICD-10-CM | POA: Diagnosis not present

## 2024-04-30 DIAGNOSIS — E559 Vitamin D deficiency, unspecified: Secondary | ICD-10-CM | POA: Diagnosis not present

## 2024-05-04 NOTE — Progress Notes (Deleted)
   Cardiology Office Note    Date:  05/04/2024  ID:  [name], DOB Oct 25, 1946, MRN [id number] PCP:  Catalina Bare, MD  Cardiologist:  Vina Gull, MD  Electrophysiologist:  None   Chief Complaint: ***  History of Present Illness: Anita Gonzalez    Anita Gonzalez is a 78 y.o. female with visit-pertinent history of coronary calcification seen on CT 2017 with negative nuc 2018, mitral stenosis, aortic atherosclerosis, mild carotid artery disease (1-39% 06/2023), DM, HTN, HLD, COPD on O2, bronchiectasis, ILD, junctional bradycardia, PACs, PVCs, SVT, anemia, CKD stage 3a seen for follow-up.  Last ischemic eval was by nuc in 2018 without ischemia. Last echo TTE 08/02/21: dynamic LV gradient 33 mmHg (49 mmHg w Valsalva), EF > 75, no RWMA, Gr 1 DD, NL RVSF, mild to mod MS (mean 5 mmHg). She had prior hx of junctional bradycardia reqiuiring dc ov BB/CCB with several prior monitors (Monitor 12/2018: accelerated junctional rhythm; 09/2019: Sinus rhythm; heart rate 34-111 (average heart rate 66); occasional PAC, rare PVC; no pauses, 07/2021: NSR, Rare PACs, PVCs; short bursts of SVT). She also has history of heart murmur felt due to hyperdynamic LV per notes.]  Echo due Whos following lipids  Hx of junctional bradycardia with monitors also noting PACs, PVCs, short bursts SVT Coronary calcification Mitral stenosis Carotid artery disease  Labwork independently reviewed: 03/2024 K 9.9, plt ok, K 4.0, cR 1.18, AST ALT not up 2020 TSh OK, LDL 72, trig 71   ROS: .    Please see the history of present illness. Otherwise, review of systems is positive for ***.  All other systems are reviewed and otherwise negative.  Studies Reviewed: Anita Gonzalez    EKG:  EKG is ordered today, personally reviewed, demonstrating ***  CV Studies: Cardiac studies reviewed are outlined and summarized above. Otherwise please see EMR for full report.   Current Reported Medications:.    No outpatient medications have been marked as taking  for the 05/06/24 encounter (Appointment) with Caitlin Hillmer N, PA-C.    Physical Exam:    VS:  There were no vitals taken for this visit.   Wt Readings from Last 3 Encounters:  03/19/24 202 lb (91.6 kg)  02/13/24 209 lb (94.8 kg)  02/01/24 214 lb 9.6 oz (97.3 kg)    GEN: Well nourished, well developed in no acute distress NECK: No JVD; No carotid bruits CARDIAC: ***RRR, no murmurs, rubs, gallops RESPIRATORY:  Clear to auscultation without rales, wheezing or rhonchi  ABDOMEN: Soft, non-tender, non-distended EXTREMITIES:  No edema; No acute deformity   Asessement and Plan:.     ***     Disposition: F/u with ***  Signed, Itzy Adler N Emonte Dieujuste, PA-C

## 2024-05-05 NOTE — Progress Notes (Deleted)
   Cardiology Office Note    Date:  05/05/2024  ID:  Anita Gonzalez, DOB July 21, 1946, MRN [id number] PCP:  Anita Bare, MD  Cardiologist:  Anita Gull, MD  Electrophysiologist:  None   Chief Complaint: ***  History of Present Illness: Anita Gonzalez    Anita Gonzalez is a 78 y.o. female with visit-pertinent history of coronary calcification seen on CT 2017 with negative nuc 2018, mitral stenosis, aortic atherosclerosis, mild carotid artery disease (1-39% 06/2023), DM, HTN, HLD, COPD on O2, bronchiectasis, ILD, junctional bradycardia, PACs, PVCs, SVT, anemia, CKD stage 3a seen for follow-up.  Last ischemic eval was by nuc in 2018 without ischemia. Last echo TTE 08/02/21: dynamic LV gradient 33 mmHg (49 mmHg w Valsalva), EF > 75, no RWMA, Gr 1 DD, NL RVSF, mild to mod MS (mean 5 mmHg). She had prior hx of junctional bradycardia reqiuiring dc ov BB/CCB with several prior monitors (Monitor 12/2018: accelerated junctional rhythm; 09/2019: Sinus rhythm; heart rate 34-111 (average heart rate 66); occasional PAC, rare PVC; no pauses, 07/2021: NSR, Rare PACs, PVCs; short bursts of SVT). She also has history of heart murmur felt due to hyperdynamic LV per notes.]  Echo due Whos following lipids  Hx of junctional bradycardia with monitors also noting PACs, PVCs, short bursts SVT Coronary calcification Mitral stenosis Carotid artery disease  Labwork independently reviewed: 03/2024 K 9.9, plt ok, K 4.0, cR 1.18, AST ALT not up 2020 TSh OK, LDL 72, trig 71   ROS: .    Please see the history of present illness. Otherwise, review of systems is positive for ***.  All other systems are reviewed and otherwise negative.  Studies Reviewed: Anita Gonzalez    EKG:  EKG is ordered today, personally reviewed, demonstrating ***  CV Studies: Cardiac studies reviewed are outlined and summarized above. Otherwise please see EMR for full report.   Current Reported Medications:.    No outpatient medications have been marked as taking  for the 05/06/24 encounter (Appointment) with Anita Drees N, PA-C.    Physical Exam:    VS:  There were no vitals taken for this visit.   Wt Readings from Last 3 Encounters:  03/19/24 202 lb (91.6 kg)  02/13/24 209 lb (94.8 kg)  02/01/24 214 lb 9.6 oz (97.3 kg)    GEN: Well nourished, well developed in no acute distress NECK: No JVD; No carotid bruits CARDIAC: ***RRR, no murmurs, rubs, gallops RESPIRATORY:  Clear to auscultation without rales, wheezing or rhonchi  ABDOMEN: Soft, non-tender, non-distended EXTREMITIES:  No edema; No acute deformity   Asessement and Plan:.     ***     Disposition: F/u with ***  Signed, Anita Cianci Gonzalez Shylin Keizer, PA-C

## 2024-05-06 ENCOUNTER — Telehealth: Payer: Self-pay

## 2024-05-06 ENCOUNTER — Ambulatory Visit: Admitting: Physician Assistant

## 2024-05-06 DIAGNOSIS — I050 Rheumatic mitral stenosis: Secondary | ICD-10-CM

## 2024-05-06 DIAGNOSIS — I6523 Occlusion and stenosis of bilateral carotid arteries: Secondary | ICD-10-CM

## 2024-05-06 DIAGNOSIS — I498 Other specified cardiac arrhythmias: Secondary | ICD-10-CM

## 2024-05-06 DIAGNOSIS — I2510 Atherosclerotic heart disease of native coronary artery without angina pectoris: Secondary | ICD-10-CM

## 2024-05-06 DIAGNOSIS — Z79899 Other long term (current) drug therapy: Secondary | ICD-10-CM | POA: Diagnosis not present

## 2024-05-06 NOTE — Progress Notes (Incomplete)
 05/08/2024 Name: Anita Gonzalez MRN: [id number] DOB: Jul 09, 1946  Chief Complaint  Patient presents with   Medication Management    Anita Gonzalez is a 78 y.o. year old female who presented for a telephone visit.   They were referred to the pharmacist by their PCP for assistance in managing complex medication management.    Subjective: Patient called PCP office with questions about the continuation for gabapentin. She was referred to the pharmacist for a comprehensive medication review and reconciliation. The patient has a list of active medications that were reviewed in addition to the chart review and prescription fill history reviewed by the clinical pharmacist.   Care Team: Primary Care Provider: Catalina Bare, [place] ; Next Scheduled Visit: 05/22/24   Medication Access/Adherence  Current Pharmacy:  [facility] - [place], [place] - [address] [place] [address] Phone: [phone] Fax: [phone]  [facility] - [address] [place] - [address] [place] [address] Phone: [phone] Fax: [phone]  [facility] - [place] - [address] [place]  [address] Phone: [phone] Fax: [phone]   Patient reports affordability concerns with their medications: No  Patient reports access/transportation concerns to their pharmacy: No  Patient reports adherence concerns with their medications:  Yes  Patient reports that she sometimes forgets which medications she's still taking and what each medication is indicated for.   Medication Management:  Current adherence strategy: Patient uses mail order pharmacy and receives 90-day supplies for most of her maintenance medications.   Patient reports Fair adherence to medications  Patient reports the following barriers to adherence: understanding, multiple specialists,  multiple pharmacies.  Recent fill dates:  All active medications filled within the last 2 months, no refills due at this time.   Verified pain management regimen with [facility] - Tramadol  50 mg three times daily as needed, with over-the-counter medications approved - Lidocaine  4%, Voltaren  1%, and Acetaminophen  650 mg. They confirmed that both gabapentin and pregabalin have both been discontinued.   Objective:    Lab Results  Component Value Date   CREATININE 1.18 (H) 03/19/2024   BUN 15 03/19/2024   NA 142 03/19/2024   K 4.0 03/19/2024   CL 103 03/19/2024   CO2 32 03/19/2024    Medications Reviewed Today     Reviewed by Anita Gonzalez, [facility] (Pharmacist) on 05/08/24 at 1545  Med List Status: <None>   Medication Order Taking? Sig Documenting Provider Last Dose Status Informant  ACCU-CHEK GUIDE TEST test strip [id number] Yes 1 each 3 (three) times daily. Provider, Historical, [place]  Active   Accu-Chek Softclix Lancets lancets [id number] Yes  Provider, Historical, [place]  Active   acetaminophen  (TYLENOL ) tablet 650 mg [id number]   Anita Callander, [place]  Active   albuterol  (PROVENTIL ) (2.5 MG/3ML) 0.083% nebulizer solution [id number] Yes USE 1 UNIT DOSE(VIAL)VIA NEBULIZER 4 TIMES A DAY AS NEEDED AS DIRECTED Anita Gonzalez  Active   albuterol  (VENTOLIN  HFA) 108 (90 Base) MCG/ACT inhaler [id number]  INHALE 2 PUFFS BY MOUTH EVERY 6 HRS AS NEEDED Anita Dorn NOVAK, [place]  Active     Discontinued 05/08/24 1541 (Change in therapy)   amLODipine  (NORVASC ) 5 MG tablet [id number] Yes 1 tab(s) orally once a day; Duration: 90 days Provider, Historical, [place]  Active   aspirin  EC 81 MG tablet [id number] Yes Take 1 tablet (81 mg total) by mouth daily. Anita Hamilton T, PA-C  Active   atorvastatin  (LIPITOR) 40 MG tablet [id number] Yes ONE TABLET BY MOUTH DAILY Anita Gonzalez  Active   Blood Glucose Monitoring Suppl (ACCU-CHEK GUIDE) w/Device KIT [id number] Yes as directed. Provider, Historical, [place]  Active      Discontinued 05/08/24 1541 (Change in therapy)   bumetanide  (BUMEX ) 2 MG tablet [id number] Yes 1 tab(s) orally twice weekly Provider, Historical, [place]  Active            Med Note (   Thu May 08, 2024  3:41 PM) Pt using 1/2 tab as needed, up to twice weekly  cholecalciferol (VITAMIN D3) 25 MCG (1000 UNIT) tablet [id number] Yes Take 1,000 Units by mouth daily. Provider, Historical, [place]  Active    Patient not taking:   Discontinued 05/08/24 1541    Discontinued 05/08/24 1541     Discontinued 05/08/24 1541   diclofenac  Sodium (VOLTAREN ) 1 % GEL [id number] Yes APPLY 2 G TOPICALLY 4 TIMES A DAY Anita Gonzalez  Active   diphenhydrAMINE  (BENADRYL ) capsule 25 mg [id number]   Anita Callander, [place]  Active   diphenoxylate-atropine (LOMOTIL) 2.5-0.025 MG tablet [id number] Yes Take 1 tablet by mouth 4 (four) times daily as needed for diarrhea or loose stools.  Provider, Historical, [place]  Active Self  docusate sodium (COLACE) 100 MG capsule [id number]  Take 100 mg by mouth 2 (two) times daily as needed for mild constipation.  Patient not taking: Reported on 05/08/2024   Provider, Historical, [place]  Active Self    Discontinued 05/08/24 1541 (Change in therapy)   ferrous sulfate  324 (65 Fe) MG TBEC [id number] Yes 1 tabet PO daily; Duration: 90 days Provider, Historical, [place]  Active   fluticasone  (FLONASE) 50 MCG/ACT nasal spray [id number] Yes Place 1 spray into both nostrils daily as needed for allergies or rhinitis. Provider, Historical, [place]  Active Self  fluticasone -salmeterol (ADVAIR) 500-50 MCG/ACT AEPB [id number] Yes Inhale 1 puff into the lungs 2 (two) times daily. Provider, Historical, [place]  Active     Discontinued 05/08/24 1541   folic acid  (FOLVITE ) 1 MG tablet [id number] Yes ONE TABLET BY MOUTH DAILY Anita Gonzalez  Active     Discontinued 05/08/24 1541   guaiFENesin  (MUCINEX ) 600 MG 12 hr tablet [id number] Yes Take 2 tablets (1,200 mg total) by mouth 2 (two) times daily as needed for cough or to loosen phlegm. Anita Almarie ORN,  NP  Active   iron  sucrose (VENOFER ) injection 200 mg [id number]   Anita Callander, [place]  Active   losartan (COZAAR) 50 MG tablet [id number] Yes TAKE 1 TABLET BY MOUTH DAILY Ross, Paula V, [place]  Active   metFORMIN (GLUCOPHAGE) 500 MG tablet [id number] Yes Take 500 mg by mouth 2 (two) times daily. Provider, Historical, [place]  Active   montelukast  (SINGULAIR ) 10 MG tablet [id number] Yes TAKE 1 TABLET BY MOUTH DAILY Dewald, Jonathan B, [place]  Active   pantoprazole  (PROTONIX ) 40 MG tablet [id number] Yes ONE TABLET BY MOUTH DAILY Anita Callander, [place]  Active   SPIRIVA  RESPIMAT 1.25 MCG/ACT AERS [id number] Yes 2 puff(s) inhaled once a day; Duration: 30 days Provider, Historical, [place]  Active   traMADol  (ULTRAM ) 50 MG tablet [id number] Yes Take 50 mg by mouth 3 (three) times daily as needed. Provider, Historical, [place]  Active   ZETIA  10 MG tablet [id number] Yes 1 tab(s) orally once a day; Duration: 90 days Provider, Historical, [place]  Active               Assessment/Plan:  Medication Management: - Currently strategy sufficient to maintain appropriate adherence to prescribed medication regimen - Suggested use of weekly pill box to organize medications - Created list of medication, indication, and administration time. Will assist patient with updated active medication list.  - Discussed collaboration with local pharmacies for adherence packaging. Reviewed local pharmacies with adherence packaging options. Patient elects to continue with mail order pharmacy for now.  - Provided patient my contact information for any additional medication or pharmacy related questions, patient reports no additional issues at this time.    Follow Up Plan: Will follow up with the patient in 3 weeks after her upcoming PCP appointment.   Heather Factor, PharmD Clinical Pharmacist  [phone]

## 2024-05-07 ENCOUNTER — Encounter (INDEPENDENT_AMBULATORY_CARE_PROVIDER_SITE_OTHER): Payer: Self-pay

## 2024-05-12 ENCOUNTER — Telehealth: Payer: Self-pay

## 2024-05-12 ENCOUNTER — Telehealth: Payer: Self-pay | Admitting: Internal Medicine

## 2024-05-12 NOTE — Telephone Encounter (Signed)
 Pt called stating that she will need to cancel her upcoming appts d/t pt is being evicted from her appt.  Pt stated she will be moving to [place]  while they work our her living arrangements here in [place] .  Pt wants to know if Dr. Lanny could refer her to an oncologist in [place]  in the interim.  Pt also requested if Social Work could contact her to help her with her living arrangements.  Notified Dr. Lanny and Social Work of the pt's call and situation.

## 2024-05-12 NOTE — Telephone Encounter (Signed)
 Tried to call pt back. Said call can not be completed at this time.  Second attempt made.  Will forward to provider for review and recommendation.

## 2024-05-12 NOTE — Telephone Encounter (Signed)
 Patient says she has to move to [place]  and she would like to know if Dr. Okey can refer her to a cardiologist in that area. Please advise.

## 2024-05-12 NOTE — Telephone Encounter (Signed)
 The home and mobile numbers on file are stating they can not be completed at this time.

## 2024-05-13 ENCOUNTER — Telehealth: Payer: Self-pay

## 2024-05-13 ENCOUNTER — Ambulatory Visit: Admitting: Nurse Practitioner

## 2024-05-13 ENCOUNTER — Inpatient Hospital Stay: Attending: Hematology | Admitting: Licensed Clinical Social Worker

## 2024-05-13 DIAGNOSIS — D500 Iron deficiency anemia secondary to blood loss (chronic): Secondary | ICD-10-CM

## 2024-05-13 NOTE — Telephone Encounter (Signed)
 New cell phone - [phone]  Spoke with pt. She is moving to [place], [place] . Will route over to Dr Okey for referral.

## 2024-05-13 NOTE — Progress Notes (Signed)
 [facility] CSW Progress Note  Clinical Child psychotherapist contacted patient by phone to follow-up on housing concerns.    Interventions: CSW received a request to contact pt regarding eviction notice.  Per pt she was living with her daughter and son-in-law.  Pt states they received an eviction notice requesting they vacate the home by July 1st.  Pt reports she has 2 daughters residing in [place] that she will move in with.  Pt's grandsons drove in from [place] and are assisting w/ packing and it is anticipated that they will start driving this evening.  Pt's medical records have been sent by RN to the provider in [place] pt requested.  No further action required at this time.            Devere JONELLE Manna, LCSW Clinical Social Worker [facility]

## 2024-05-13 NOTE — Telephone Encounter (Signed)
 Spoke with pt regarding referral to hematologist in [place] .  Pt stated she's moving to the [facility] area and would prefer to be referred to the [facility] [place]  [facility] New Patient Referral Coordinator [phone]).  Spoke with Bria at [facility] [place]  [facility] and she stated to send the images of any radiology test to the pt on CD so the pt can bring it in with them on their initial appt.  Stated this nurse will send the CD to the pt.  Faxed referral packet to [facility] [place] .  Fax confirmation received.

## 2024-05-13 NOTE — Telephone Encounter (Signed)
 Anita [place] is right there in [place] There is a campus downtown   There is aloso the [place] campus that may be less congested.   Both have  excellent faculty on staff. I don't have any one person to recommend but their reputation is excellent

## 2024-05-14 ENCOUNTER — Inpatient Hospital Stay

## 2024-05-15 NOTE — Telephone Encounter (Signed)
 Left a detailed message for the pt... also sent Dr Okey' recommendations to her My Chart.

## 2024-06-18 ENCOUNTER — Other Ambulatory Visit: Payer: Self-pay | Admitting: Hematology

## 2024-06-24 ENCOUNTER — Ambulatory Visit: Admitting: Student

## 2024-07-15 ENCOUNTER — Other Ambulatory Visit: Payer: Self-pay

## 2024-07-15 DIAGNOSIS — D500 Iron deficiency anemia secondary to blood loss (chronic): Secondary | ICD-10-CM

## 2024-07-16 ENCOUNTER — Inpatient Hospital Stay

## 2024-07-16 ENCOUNTER — Telehealth: Payer: Self-pay | Admitting: Hematology

## 2024-07-16 DIAGNOSIS — N1831 Chronic kidney disease, stage 3a: Secondary | ICD-10-CM | POA: Diagnosis not present

## 2024-07-16 DIAGNOSIS — I130 Hypertensive heart and chronic kidney disease with heart failure and stage 1 through stage 4 chronic kidney disease, or unspecified chronic kidney disease: Secondary | ICD-10-CM | POA: Diagnosis not present

## 2024-07-16 DIAGNOSIS — Z0001 Encounter for general adult medical examination with abnormal findings: Secondary | ICD-10-CM | POA: Diagnosis not present

## 2024-07-16 DIAGNOSIS — J4489 Other specified chronic obstructive pulmonary disease: Secondary | ICD-10-CM | POA: Diagnosis not present

## 2024-07-16 DIAGNOSIS — E1122 Type 2 diabetes mellitus with diabetic chronic kidney disease: Secondary | ICD-10-CM | POA: Diagnosis not present

## 2024-07-16 DIAGNOSIS — I5032 Chronic diastolic (congestive) heart failure: Secondary | ICD-10-CM | POA: Diagnosis not present

## 2024-07-16 DIAGNOSIS — D509 Iron deficiency anemia, unspecified: Secondary | ICD-10-CM | POA: Diagnosis not present

## 2024-07-16 NOTE — Telephone Encounter (Signed)
 Pt  called to change her appt time and date.

## 2024-07-20 ENCOUNTER — Other Ambulatory Visit: Payer: Self-pay | Admitting: Hematology

## 2024-07-22 ENCOUNTER — Telehealth: Payer: Self-pay

## 2024-07-22 NOTE — Progress Notes (Signed)
   07/22/2024  Patient ID: Anita [place], female   DOB: 1946-07-04, 78 y.o.   MRN: [id number]  Contacted patient regarding referral for medication management from Catalina Bare, [place] .   Appointment scheduled for 07/29/24 at 11:00 am.   Heather Factor, PharmD Clinical Pharmacist  [phone]

## 2024-07-23 ENCOUNTER — Telehealth: Payer: Self-pay | Admitting: Internal Medicine

## 2024-07-23 ENCOUNTER — Inpatient Hospital Stay: Attending: Hematology

## 2024-07-23 ENCOUNTER — Ambulatory Visit: Payer: Self-pay | Admitting: Pulmonary Disease

## 2024-07-23 DIAGNOSIS — R002 Palpitations: Secondary | ICD-10-CM

## 2024-07-23 DIAGNOSIS — J449 Chronic obstructive pulmonary disease, unspecified: Secondary | ICD-10-CM

## 2024-07-23 DIAGNOSIS — D500 Iron deficiency anemia secondary to blood loss (chronic): Secondary | ICD-10-CM | POA: Insufficient documentation

## 2024-07-23 DIAGNOSIS — K5521 Angiodysplasia of colon with hemorrhage: Secondary | ICD-10-CM | POA: Insufficient documentation

## 2024-07-23 NOTE — Telephone Encounter (Signed)
 Called patient back about message. Patient complaining of palpitations and irregular heart beats that has been going on for a month. Patient stated she does not have a heart rate reading to give, but recent BP 160/80. Tried make patient an appointment to see Dr. Okey on Monday. Patient stated she needs 3 business days to get transportation and she can only do afternoons. Made patient an appointment to see NP next Friday. Will send message to Dr. Okey and her nurse for further advisement.

## 2024-07-23 NOTE — Telephone Encounter (Signed)
 Pt states she is now on the other line with her cardiologist and wants to call back after.                 Copied from CRM #[id number]. Topic: Clinical - Red Word Triage >> Jul 23, 2024  3:59 PM Rilla B wrote: [facility] that prompted transfer to Nurse Triage: Some diff breathing, congestion, history of asthma and COPD   ----------------------------------------------------------------------- From previous Reason for Contact - Scheduling: Patient/patient representative is calling to schedule an appointment. Refer to attachments for appointment information.

## 2024-07-23 NOTE — Telephone Encounter (Signed)
 FYI Only or Action Required?: Action required by provider: request for appointment and request for medication.  Patient is followed in Pulmonology for COPD, last seen on 02/01/2024 by Anita Gonzalez ORN, NP.  Called Nurse Triage reporting Shortness of Breath.  Symptoms began about a month ago.  Interventions attempted: Rescue inhaler and Maintenance inhaler.  Symptoms are: gradually worsening.  Triage Disposition: See PCP When Office is Open (Within 3 Days)  Patient/caregiver understands and will follow disposition?: No, wishes to speak with PCP (No appointments that patient could come in for)         Copied from CRM #[id number]. Topic: Clinical - Red Word Triage >> Jul 23, 2024  3:59 PM Anita Gonzalez wrote: [facility] that prompted transfer to Nurse Triage: Some diff breathing, congestion, history of asthma and COPD         Reason for Disposition  [1] MODERATE longstanding difficulty breathing (e.g., speaks in phrases, SOB even at rest, pulse 100-120) AND [2] SAME as normal  Answer Assessment - Initial Assessment Questions Patient unable to come in for the available appointments due to needing to notify her transportation in advance. Please advise.   Patient instructed to call back for new or worsening symptoms. Patient verbalized understanding and agreement with this plan.      1. RESPIRATORY STATUS: Describe your breathing? (e.g., wheezing, shortness of breath, unable to speak, severe coughing)      Shortness of breath  2. ONSET: When did this breathing problem begin?      1 month ago  3. PATTERN Does the difficult breathing come and go, or has it been constant since it started?      Intermittent  4. SEVERITY: How bad is your breathing? (e.g., mild, moderate, severe)      Mild to moderate  5. RECURRENT SYMPTOM: Have you had difficulty breathing before? If Yes, ask: When was the last time? and What happened that time?      Yes, history of COPD and asthma   6. CARDIAC HISTORY: Do you have any history of heart disease? (e.g., heart attack, angina, bypass surgery, angioplasty)      Yes3 7. LUNG HISTORY: Do you have any history of lung disease?  (e.g., pulmonary embolus, asthma, emphysema)     Yes 8. CAUSE: What do you think is causing the breathing problem?      History of COPD and asthma  9. OTHER SYMPTOMS: Do you have any other symptoms? (e.g., chest pain, cough, dizziness, fever, runny nose)     Chest congestion, cough, yellow sputum production  10. O2 SATURATION MONITOR:  Do you use an oxygen  saturation monitor (pulse oximeter) at home? If Yes, ask: What is your reading (oxygen  level) today? What is your usual oxygen  saturation reading? (e.g., 95%)       97%  Protocols used: Breathing Difficulty-A-AH

## 2024-07-23 NOTE — Telephone Encounter (Signed)
 Patient c/o Palpitations:  STAT if patient reporting lightheadedness, shortness of breath, or chest pain  How long have you had palpitations/irregular HR/ Afib? Are you having the symptoms now? Palpitations coming and going, more often past month   Are you currently experiencing lightheadedness, SOB or CP? No   Do you have a history of afib (atrial fibrillation) or irregular heart rhythm? No   Have you checked your BP or HR? (document readings if available): does not check   Are you experiencing any other symptoms? She states she does have sob sometimes but that is because she has copd   She has added in quarter clinic 9/15 and she is due for f/u, but it is on hold so did not want to schedule without nurse okay. Pt prefers to see [place].

## 2024-07-24 ENCOUNTER — Other Ambulatory Visit: Payer: Self-pay

## 2024-07-24 ENCOUNTER — Ambulatory Visit: Attending: Internal Medicine

## 2024-07-24 DIAGNOSIS — R002 Palpitations: Secondary | ICD-10-CM

## 2024-07-24 MED ORDER — AZITHROMYCIN 250 MG PO TABS: 250.0000 mg | ORAL_TABLET | ORAL | 0 refills | Status: AC

## 2024-07-24 NOTE — Telephone Encounter (Signed)
 Shared Dr. Okey' recommendation with patient: Set patient up for 2 week Zio patch to be mailed to her  Have her keep log of BP readings and send in       14 day Zio ordered, to be mailed to patient. Patient will send/call BP readings in 2 weeeks.  Patient verbalized understanding of the above.

## 2024-07-24 NOTE — Telephone Encounter (Signed)
Dr. Francine Graven, please advise. Thanks

## 2024-07-24 NOTE — Telephone Encounter (Addendum)
 PT states she can not take prednisone     Zpak has been sent in to refua pharmacy  at PT request

## 2024-07-24 NOTE — Progress Notes (Signed)
 This encounter was created in error - please disregard.

## 2024-07-24 NOTE — Telephone Encounter (Signed)
 Set patient up for 2 week Zio patch to be mailed to her  Have her keep log of BP readings and send in

## 2024-07-24 NOTE — Progress Notes (Unsigned)
 Enrolled patient for a 14 day Zio XT  monitor to be mailed to patients home

## 2024-07-24 NOTE — Telephone Encounter (Signed)
 Please send in prednisone  40mg  daily x 5 days and a Zpak antibiotic for COPD exacerbation. I did not send in as I did not know her preferred pharmacy location.  Thanks, JD

## 2024-07-28 NOTE — Telephone Encounter (Signed)
 Pt called in asking if she should keep her appt Friday or push out until after heart monitor

## 2024-07-28 NOTE — Telephone Encounter (Signed)
 Spoke with Pt. Pt will keep appointment on Friday as it will take time to get the results of the monitor.

## 2024-07-29 NOTE — Progress Notes (Signed)
   07/29/2024  Patient ID: Anita [place], female   DOB: 1946-01-10, 78 y.o.   MRN: [id number]  Contacted patient regarding referral for medication management from Catalina Bare, [place] .   Needing to reschedule appointment today. Appointment scheduled for 07/30/24 at 3:00 am.   Heather Factor, PharmD Clinical Pharmacist  [phone]

## 2024-07-30 ENCOUNTER — Telehealth: Payer: Self-pay

## 2024-07-30 ENCOUNTER — Encounter: Payer: Self-pay | Admitting: *Deleted

## 2024-07-30 NOTE — Progress Notes (Signed)
 07/30/2024 Name: Anita Gonzalez MRN: [id number] DOB: 11/24/1945  Chief Complaint  Patient presents with   Medication Management    Anita Gonzalez is a 78 y.o. year old female who presented for a telephone visit.   They were referred to the pharmacist by their PCP for assistance in managing complex medication management.    Subjective:  Care Team: Primary Care Provider: Catalina Bare, MD ; Next Scheduled Visit: 09/09/2024 {careteamprovider:27366}  Medication Access/Adherence  Current Pharmacy:  [facility] - [place] - [address] [place] [address] Phone: [phone] Fax: [phone]  [facility] - [address], [place] - [address] [place] [address] Phone: [phone] Fax: [phone]  [facility] - [place] - [address] [place] [address] Phone: [phone] Fax: [phone]   Patient reports affordability concerns with their medications: No  Patient reports access/transportation concerns to their pharmacy: No  Patient reports adherence concerns with their medications:  No  ***   Medication Management:  Current adherence strategy: ***  Patient reports Fair adherence to medications  Patient reports the following barriers to adherence: ***  Recent fill dates:  Health Maintenance  Yearly diabetic eye exam: {due/up to date:26282} Yearly diabetic foot exam: {due/up to date:26282} Urine microalbumin: {due/up to date:26282} Yearly influenza vaccination: {due/up to date:26282} Td/Tdap vaccination: {due/up to date:26282} Pneumonia vaccination: {due/up to ijuz:73717} COVID vaccinations: {due/up to ijuz:73717} Shingrix vaccinations: {due/up to date:26282} Colonoscopy: {due/up to date:26282} Bone density scan: {due/up to ijuz:73717} Mammogram: {due/up to date:26282}   Objective:  No  results found for: HGBA1C  Lab Results  Component Value Date   CREATININE 1.18 (H) 03/19/2024   BUN 15 03/19/2024   NA 142 03/19/2024   K 4.0 03/19/2024   CL 103 03/19/2024   CO2 32 03/19/2024      Medications Reviewed Today     Reviewed by Graylon Keen, [facility] (Pharmacist) on 07/30/24 at 1556  Med List Status: <None>   Medication Order Taking? Sig Documenting Provider Last Dose Status Informant  ACCU-CHEK GUIDE TEST test strip [id number] Yes 1 each 3 (three) times daily. [provider]  Active   Accu-Chek Softclix Lancets lancets [id number] Yes  [provider]  Active   acetaminophen  (TYLENOL ) 650 MG CR tablet [id number] Yes Take 650 mg by mouth every 8 (eight) hours as needed for pain.  Patient taking differently: Take 650 mg by mouth 2 (two) times daily as needed for pain.   [provider]  Active   acetaminophen  (TYLENOL ) tablet 650 mg [id number]   Lanny Callander, MD  Active   albuterol  (PROVENTIL ) (2.5 MG/3ML) 0.083% nebulizer solution [id number] Yes USE 1 UNIT DOSE(VIAL)VIA NEBULIZER 4 TIMES A DAY AS NEEDED AS DIRECTED Okey Vina GAILS, MD  Active   albuterol  (VENTOLIN  HFA) 108 (90 Base) MCG/ACT inhaler [id number] Yes INHALE 2 PUFFS BY MOUTH EVERY 6 HRS AS NEEDED Kara Dorn NOVAK, MD  Active   amLODipine  (NORVASC ) 5 MG tablet [id number] Yes 1 tab(s) orally once a day; Duration: 90 days [provider]  Active   aspirin  EC 81 MG tablet [id number] Yes Take 1 tablet (81 mg total) by mouth daily. Lelon Hamilton T, PA-C  Active   atorvastatin  (LIPITOR) 40 MG tablet [id number] Yes ONE TABLET BY MOUTH DAILY Okey Vina GAILS, MD  Active   azithromycin  (ZITHROMAX ) 250 MG tablet [id number] Yes Take 1 tablet (250 mg total) by mouth  as directed. Kara Dorn NOVAK, MD  Active   Blood Glucose Monitoring Suppl (ACCU-CHEK GUIDE) w/Device KIT [id number] Yes as directed. [provider]  Active   bumetanide  (BUMEX ) 2 MG tablet [id number] Yes 1 tab(s) orally twice  weekly [provider]  Active            Med Note (   Thu May 08, 2024  3:41 PM) Pt using 1/2 tab as needed, up to twice weekly  cetirizine (ZYRTEC) 10 MG tablet [id number] Yes 1 tab(s) orally once a day; Duration: 30 days As needed [provider]  Active   cholecalciferol (VITAMIN D3) 25 MCG (1000 UNIT) tablet [id number] Yes Take 1,000 Units by mouth daily. [provider]  Active   diclofenac  Sodium (VOLTAREN ) 1 % GEL [id number] Yes APPLY 2 G TOPICALLY 4 TIMES A DAY Feng, Yan, MD  Active   diphenhydrAMINE  (BENADRYL ) capsule 25 mg [id number]   Lanny Callander, MD  Active   diphenoxylate-atropine (LOMOTIL) 2.5-0.025 MG tablet [id number] Yes Take 1 tablet by mouth 4 (four) times daily as needed for diarrhea or loose stools.  [provider]  Active Self   Patient not taking:   Discontinued 07/30/24 1549 (Patient has not taken in last 30 days) ferrous sulfate  324 (65 Fe) MG TBEC [id number] Yes 1 tabet PO daily; Duration: 90 days [provider]  Active   fluticasone  (FLONASE) 50 MCG/ACT nasal spray [id number] Yes Place 1 spray into both nostrils daily as needed for allergies or rhinitis. [provider]  Active Self  fluticasone -salmeterol (ADVAIR) 500-50 MCG/ACT AEPB [id number] Yes Inhale 1 puff into the lungs 2 (two) times daily. [provider]  Active   folic acid  (FOLVITE ) 1 MG tablet [id number] Yes ONE TABLET BY MOUTH DAILY Okey Vina GAILS, MD  Active   guaiFENesin  (MUCINEX ) 600 MG 12 hr tablet [id number] Yes Take 2 tablets (1,200 mg total) by mouth 2 (two) times daily as needed for cough or to loosen phlegm. Hope Almarie ORN, NP  Active   iron  sucrose (VENOFER ) injection 200 mg [id number]   Lanny Callander, MD  Active   lidocaine  (LIDODERM ) 5 % [id number] Yes 1 patch daily as needed. [provider]  Active   losartan (COZAAR) 50 MG tablet [id number] Yes TAKE 1 TABLET BY MOUTH DAILY Ross, Paula V, MD  Active   metFORMIN (GLUCOPHAGE) 500 MG tablet  [id number] Yes Take 500 mg by mouth 2 (two) times daily. [provider]  Active   montelukast  (SINGULAIR ) 10 MG tablet [id number] Yes TAKE 1 TABLET BY MOUTH DAILY Dewald, Jonathan B, MD  Active   pantoprazole  (PROTONIX ) 40 MG tablet [id number] Yes ONE TABLET BY MOUTH DAILY Lanny Callander, MD  Active   SPIRIVA  RESPIMAT 1.25 MCG/ACT AERS [id number] Yes 2 puff(s) inhaled once a day; Duration: 30 days [provider]  Active   traMADol  (ULTRAM ) 50 MG tablet [id number]  Take 50 mg by mouth 3 (three) times daily as needed.  Patient not taking: Reported on 07/30/2024   [provider]  Active   ZETIA  10 MG tablet [id number] Yes 1 tab(s) orally once a day; Duration: 90 days [provider]  Active               Assessment/Plan:   Medication Management: - Currently strategy {sufficient/insufficient:26424} to maintain appropriate adherence to prescribed medication regimen - Suggested use of weekly pill box to organize medications - Created list of medication, indication, and administration time. Provided to patient - Discussed collaboration  with local pharmacies for adherence packaging. Reviewed local pharmacies with adherence packaging options. Patient elects to ***     Health Maintenance - Discussed CDC/ACIP recommendations for *** vaccine(s). Recommended *** - Discussed screening recommendations for *** . Encouraged to discuss with PCP at next appointment.  - Placed reminder for *** screening in next PCP appointment notes.  Follow Up Plan: ***  ***

## 2024-07-31 ENCOUNTER — Telehealth: Payer: Self-pay | Admitting: Hematology

## 2024-07-31 ENCOUNTER — Other Ambulatory Visit: Payer: Self-pay | Admitting: Internal Medicine

## 2024-07-31 NOTE — Telephone Encounter (Signed)
 Returned a call form the pt regarding appt with dr.feng on 08/13/24 and pt called asking for a sooner appt. Patient is aware of her sooner appt.

## 2024-08-01 ENCOUNTER — Encounter: Payer: Self-pay | Admitting: Emergency Medicine

## 2024-08-01 ENCOUNTER — Ambulatory Visit: Attending: Emergency Medicine | Admitting: Emergency Medicine

## 2024-08-01 VITALS — BP 130/58 | HR 74 | Ht 61.0 in | Wt 200.0 lb

## 2024-08-01 DIAGNOSIS — I491 Atrial premature depolarization: Secondary | ICD-10-CM

## 2024-08-01 DIAGNOSIS — R011 Cardiac murmur, unspecified: Secondary | ICD-10-CM | POA: Diagnosis not present

## 2024-08-01 DIAGNOSIS — I2510 Atherosclerotic heart disease of native coronary artery without angina pectoris: Secondary | ICD-10-CM

## 2024-08-01 DIAGNOSIS — I10 Essential (primary) hypertension: Secondary | ICD-10-CM

## 2024-08-01 DIAGNOSIS — I498 Other specified cardiac arrhythmias: Secondary | ICD-10-CM | POA: Diagnosis not present

## 2024-08-01 DIAGNOSIS — I6523 Occlusion and stenosis of bilateral carotid arteries: Secondary | ICD-10-CM | POA: Diagnosis not present

## 2024-08-01 DIAGNOSIS — I493 Ventricular premature depolarization: Secondary | ICD-10-CM

## 2024-08-01 DIAGNOSIS — R002 Palpitations: Secondary | ICD-10-CM | POA: Diagnosis not present

## 2024-08-01 DIAGNOSIS — R0602 Shortness of breath: Secondary | ICD-10-CM | POA: Diagnosis not present

## 2024-08-01 NOTE — Patient Instructions (Addendum)
 Medication Instructions:  NO CHANGES   Lab Work: TSH, MAGNESIUM, BMET, AND CBC TO BE DONE TODAY.   Testing/Procedures: HEART MONITOR WAS PUT ON TODAY IN OFFICE.   Follow-Up: At [facility], you and your health needs are our priority.  As part of our continuing mission to provide you with exceptional heart care, our providers are all part of one team.  This team includes your primary Cardiologist (physician) and Advanced Practice Providers or APPs (Physician Assistants and Nurse Practitioners) who all work together to provide you with the care you need, when you need it.  Your next appointment:   3 MONTHS  Provider:   Vina Gull, MD

## 2024-08-01 NOTE — Progress Notes (Signed)
 Cardiology Office Note:    Date:  08/01/2024  ID:  Olena HERO Hardgrave, DOB [birth year], MRN [id number] PCP: Catalina Bare, MD  [facility] Providers Cardiologist:  Vina Gull, MD       Patient Profile:       Chief Complaint: Acute visit for palpitations History of Present Illness:  CALENE PARADISO is a 78 y.o. female with visit-pertinent history of hypertension, hyperlipidemia, junctional bradycardia resolved with stopping beta-blockers, coronary artery disease noted on CT scan in 2017 with normal Myoview  2018, CKD, COPD, chronic hypoxia with respiratory failure on oxygen   Patient was seen by [place], PA on 07/2020 and was in junctional rhythm with rates in the 70s.  Verapamil  was stopped and amlodipine  was increased for better blood pressure control.  She was scheduled to come back in a couple of weeks for repeat EKG but never did.  Echocardiogram was also ordered for shortness of breath and never completed.  She was seen by Rosaline, PA on 06/2021.  She did not come in with her oxygen  and needed a portable tank.  She was having trouble transportation.  Patient denied any chest pains or palpitations.  Says her heart rate gets low at times and she gets a little lightheaded.   She was last seen in clinic on 05/04/2023 by [place], [place].  She reported she only needs her oxygen  when she is walking.  Reports she was having lightheadedness and her heart rate feels like it is skipping a beat.  The skipped beats occur a couple times a month but really do not bother her too much.  No medication changes were made.  She was to follow-up in 1 year.  Carotid duplex 06/2023 showed bilateral ICAs of 1-39% stenosis.  On 07/23/2024 patient called into triage noting palpitations over the past month.  ZIO monitor was ordered and is currently pending.   Discussed the use of AI scribe software for clinical note transcription with the patient, who gave verbal consent to proceed.  History of Present Illness ESMAE DONATHAN is a 78 year old female who presents with palpitations.  She experiences palpitations characterized by skipped heartbeats, increasing in frequency over the past month. Episodes occur every other day or every two to three days, lasting a few seconds each time. There are no associated symptoms such as lightheadedness, dizziness, chest pain, fluttering sensations, or tachycardia.  There is no syncope or presyncope.  She has asthma and COPD, that she says is well-controlled without recent exacerbations.  She uses her oxygen  as needed.  Her medical history includes anemia, with a scheduled follow-up on the 23rd of this month. She has previously received infusions for anemia, though it has been some time since her last treatment.   Review of systems:  Please see the history of present illness. All other systems are reviewed and otherwise negative.      Studies Reviewed:    EKG Interpretation Date/Time:  Friday August 01 2024 14:19:47 EDT Ventricular Rate:  74 PR Interval:  198 QRS Duration:  116 QT Interval:  438 QTC Calculation: 486 R Axis:   -9  Text Interpretation: Normal sinus rhythm Incomplete right bundle branch block When compared with ECG of 04-May-2023 14:05, Sinus rhythm has replaced Ectopic atrial rhythm Confirmed by Rana Dixon [phone]) on 08/01/2024 4:38:29 PM    Carotid duplex 06/22/2023 Right Carotid: Velocities in the right ICA are consistent with a 1-39%  stenosis.   Left Carotid: Velocities in the left ICA are consistent with  a 1-39%  stenosis.   Vertebrals: Bilateral vertebral arteries demonstrate antegrade flow.  Subclavians: Normal flow hemodynamics were seen in bilateral subclavian               arteries.   Zio 07/05/2021 Patch Wear Time:  14 days and 0 hours (2022-08-30T20:18:59-0400 to 2022-09-13T20:19:01-399)   Predominant rhythm is SR   Rates 53 to 136 bpm  Average HR 77 bpm    Rare PACs, PVCs   Short bursts of SVT   Fastest 13 beats at 193  bpm    Longest 11.2 sec at 146 bpm No diary entries    Echocardiogram 07/27/2021  1. There is a dynamic LV gradient . Peak gradient of 33 mmHg with an  increase to 49 mmHG with Valsalva. . Left ventricular ejection fraction,  by estimation, is >75%. The left ventricle has hyperdynamic function. The  left ventricle has no regional wall  motion abnormalities. Left ventricular diastolic parameters are consistent  with Grade I diastolic dysfunction (impaired relaxation).   2. Right ventricular systolic function is normal. The right ventricular  size is normal.   3. The mitral valve is abnormal. No evidence of mitral valve  regurgitation. Mild to moderate mitral stenosis.   4. The aortic valve is grossly normal. Aortic valve regurgitation is not  visualized. No aortic stenosis is present.   Risk Assessment/Calculations:              Physical Exam:   VS:  BP (!) 130/58 (BP Location: Left Arm, Patient Position: Sitting, Cuff Size: Large)   Pulse 74   Ht 5' 1 (1.549 m)   Wt 200 lb (90.7 kg)   BMI 37.79 kg/m    Wt Readings from Last 3 Encounters:  08/01/24 200 lb (90.7 kg)  03/19/24 202 lb (91.6 kg)  02/13/24 209 lb (94.8 kg)    GEN: Well nourished, well developed in no acute distress NECK: No JVD; No carotid bruits CARDIAC: RRR, no murmurs, rubs, gallops RESPIRATORY:  Clear to auscultation without rales, wheezing or rhonchi  ABDOMEN: Soft, non-tender, non-distended EXTREMITIES:  No edema; No acute deformity      Assessment and Plan:  Palpitations PVCs/PACs ZIO 07/2021 with rare PACs/PVCs and short bursts of SVT ZIO 08/2019 with occasional PAC and rare PVC - Patient reports experiencing palpitations described as skipped heartbeats.  She does have chronic palpitations and these are not unusual for her however she has had an increase in frequency over the past month.  Episodes occur usually every other day to every 2 to 3 days lasting just a few seconds at a time.  There are no  associated symptoms such as lightheadedness/dizziness, syncope, presyncope, chest pain/dyspnea - EKG was without arrhythmia or ectopy - Her symptoms described are likely PAC/PVC - She is pending 14 day heart monitor placed by Dr. Okey - CBC, BMET, TSH, MAG today  History of junctional rhythm History of junctional rhythm in 07/2020.  Verapamil  subsequently stopped at that time - EKG today shows she is maintaining normal sinus rhythm.  Overall asymptomatic besides skipped beats as noted above  Hypertension Blood pressure today is well-controlled at 130/58 - Continue current antihypertensive regimen  Carotid artery disease Carotid duplex 06/2023 with bilateral ICA 1-39% stenosis - She denies any new neurological symptoms - No interventions warranted at this time -Continue aspirin  81 mg daily and atorvastatin  40 mg daily   Coronary artery disease CAD on CT in 2017 Lexiscan  Myoview  01/2017 without ischemia or infarction - Today she  is stable without anginal symptoms.  No indication for further ischemic evaluation at this time - Continue aspirin  81 mg daily and atorvastatin  40 mg daily  COPD with asthma No recent exacerbations - Home chronic O2 as needed      Dispo:  Return in about 3 months (around 10/31/2024).  Signed, Lum LITTIE Louis, NP

## 2024-08-02 LAB — BASIC METABOLIC PANEL WITH GFR
BUN/Creatinine Ratio: 17 (ref 12–28)
BUN: 21 mg/dL (ref 8–27)
CO2: 25 mmol/L (ref 20–29)
Calcium: 9.9 mg/dL (ref 8.7–10.3)
Chloride: 102 mmol/L (ref 96–106)
Creatinine, Ser: 1.23 mg/dL — ABNORMAL HIGH (ref 0.57–1.00)
Glucose: 96 mg/dL (ref 70–99)
Potassium: 3.9 mmol/L (ref 3.5–5.2)
Sodium: 144 mmol/L (ref 134–144)
eGFR: 46 mL/min/1.73 — ABNORMAL LOW (ref >59)

## 2024-08-02 LAB — CBC
Hematocrit: 33.4 % — ABNORMAL LOW (ref 34.0–46.6)
Hemoglobin: 10.3 g/dL — ABNORMAL LOW (ref 11.1–15.9)
MCH: 29.4 pg (ref 26.6–33.0)
MCHC: 30.8 g/dL — ABNORMAL LOW (ref 31.5–35.7)
MCV: 95 fL (ref 79–97)
Platelets: 309 x10E3/uL (ref 150–450)
RBC: 3.5 x10E6/uL — ABNORMAL LOW (ref 3.77–5.28)
RDW: 14.4 % (ref 11.7–15.4)
WBC: 6 x10E3/uL (ref 3.4–10.8)

## 2024-08-02 LAB — TSH: TSH: 1.03 u[IU]/mL (ref 0.450–4.500)

## 2024-08-02 LAB — MAGNESIUM: Magnesium: 1.8 mg/dL (ref 1.6–2.3)

## 2024-08-03 ENCOUNTER — Ambulatory Visit: Payer: Self-pay | Admitting: Emergency Medicine

## 2024-08-05 ENCOUNTER — Ambulatory Visit (INDEPENDENT_AMBULATORY_CARE_PROVIDER_SITE_OTHER): Admitting: Audiology

## 2024-08-05 ENCOUNTER — Encounter (INDEPENDENT_AMBULATORY_CARE_PROVIDER_SITE_OTHER): Payer: Self-pay | Admitting: Otolaryngology

## 2024-08-05 ENCOUNTER — Ambulatory Visit (INDEPENDENT_AMBULATORY_CARE_PROVIDER_SITE_OTHER): Admitting: Otolaryngology

## 2024-08-05 VITALS — BP 130/74 | HR 67 | Temp 98.1°F | Ht 60.0 in | Wt 200.0 lb

## 2024-08-05 DIAGNOSIS — H903 Sensorineural hearing loss, bilateral: Secondary | ICD-10-CM | POA: Insufficient documentation

## 2024-08-05 NOTE — Progress Notes (Signed)
  [address], [address] [place], [place] [address] [phone]  Audiological Evaluation    Name: Anita Gonzalez     DOB:   1946/09/11      MRN:   [id number]                                                                                     Service Date: 08/05/2024     Accompanied by: unaccompanied   Patient comes today after Dr. Karis, ENT sent a referral for a hearing evaluation due to concerns with hearing loss.   Symptoms Yes Details  Hearing loss  [x]  Longstanding in both ears  Tinnitus  []    Ear pain/ infections/pressure  []    Balance problems  []    Noise exposure history  []    Previous ear surgeries  []    Family history of hearing loss  []    Amplification  [x]  Hearing aids from [facility] are old/broken   Other  []      Otoscopy: Right ear: Clear external ear canal and notable landmarks visualized on the tympanic membrane. Left ear:  Clear external ear canal and notable landmarks visualized on the tympanic membrane.  Tympanometry: Right ear: Type A- Normal external ear canal volume with normal middle ear pressure and tympanic membrane compliance. Left ear: Type A- Normal external ear canal volume with normal middle ear pressure and tympanic membrane compliance.  Pure tone Audiometry: Both ears-  Mild to moderately severe sensorineural hearing loss from 125 Hz - 8000 Hz.   Speech Audiometry: Right ear- Speech Reception Threshold (SRT) was obtained at 50 dBHL. Left ear-Speech Reception Threshold (SRT) was obtained at 45 dBHL.   Word Recognition Score Tested using NU-6 (recorded) Right ear: 92% was obtained at a presentation level of 90 dBHL with contralateral masking which is deemed as  excellent. Left ear: 92% was obtained at a presentation level of 80 dBHL with contralateral masking which is deemed as  excellent.   The hearing test results were completed under headphones and results are deemed to be of good reliability. Test technique:  conventional     Impression: There is not a significant difference in pure-tone thresholds between ears. There is not a significant difference in the word recognition score in between ears.    Recommendations: Follow up with ENT as scheduled for today.  Return for a hearing evaluation if concerns with hearing changes arise or per MD recommendation.   Bryce Kimble MARIE LEROUX-MARTINEZ, AUD

## 2024-08-05 NOTE — Progress Notes (Signed)
 CC: Progressive hearing loss  HPI:  Anita Gonzalez is a 78 y.o. female who presents today complaining of bilateral progressive hearing loss for many years.  She was previously fitted with bilateral hearing aids.  The hearing aids are no longer functional.  Currently she denies any otalgia, otorrhea, or vertigo.  She has no previous otologic surgery.  She also denies any recent otitis media or otitis externa.  Past Medical History:  Diagnosis Date   Acute kidney injury 01/11/2015   Alcohol abuse, in remission    Anemia    Anemia, iron  deficiency 01/28/2015   Arthritis    Asthma    Bronchiectasis (HCC) 03/07/2016   Bronchitis    COPD (chronic obstructive pulmonary disease) (HCC)    Diabetes mellitus without complication (HCC)    type 2   DM II (diabetes mellitus, type II), controlled (HCC) 01/06/2015   Dyspnea and respiratory abnormality 04/06/2015   Dysrhythmia    hx of palpitations   E-coli UTI 01/11/2015   Essential hypertension 01/06/2015   GERD (gastroesophageal reflux disease)    Hay fever    Hepatitis    c treated with pills   HLD (hyperlipidemia) 01/06/2015   Hypertension    IBS (irritable bowel syndrome)    ILD (interstitial lung disease) (HCC) 12/11/2016   Leg cramps, sleep related 12/01/2015   Pneumonia    Rheumatoid arthritis (HCC) 1996   Sinus tachycardia 01/11/2015   LT monitor 01/2019: normal sinus rhythm, occ short bursts of PAT, accelerated junctional rhythm, no sig arrhythmias.     Past Surgical History:  Procedure Laterality Date   ABDOMINAL HYSTERECTOMY     BALLOON DILATION N/A 03/15/2018   Procedure: BALLOON DILATION;  Surgeon: Rollin Dover, MD;  Location: [facility];  Service: Endoscopy;  Laterality: N/A;   CARPAL TUNNEL RELEASE Left 04/19/2018   Procedure: CARPAL TUNNEL RELEASE;  Surgeon: Gillie Duncans, MD;  Location: [facility];  Service: Neurosurgery;  Laterality: Left;  CARPAL TUNNEL RELEASE   CESAREAN SECTION     COLONOSCOPY WITH PROPOFOL  N/A 04/02/2015    Procedure: COLONOSCOPY WITH PROPOFOL ;  Surgeon: Dover Rollin, MD;  [facility];  Service: Endoscopy;  Laterality: N/A;   ESOPHAGOGASTRODUODENOSCOPY (EGD) WITH PROPOFOL  N/A 04/02/2015   Procedure: ESOPHAGOGASTRODUODENOSCOPY (EGD) WITH PROPOFOL ;  Surgeon: Dover Rollin, MD;  [facility];  Service: Endoscopy;  Laterality: N/A;   ESOPHAGOGASTRODUODENOSCOPY (EGD) WITH PROPOFOL  N/A 03/15/2018   Procedure: ESOPHAGOGASTRODUODENOSCOPY (EGD) WITH PROPOFOL ;  Surgeon: Rollin Dover, MD;  [facility];  Service: Endoscopy;  Laterality: N/A;   HEMORRHOID SURGERY     HERNIA REPAIR     HERNIA REPAIR      Family History  Problem Relation Age of Onset   Diabetes Mother    Heart failure Mother    Throat cancer Brother        throat cancer    Asthma Other    COPD Other    Hypertension Other    Stroke Other    Heart disease Other     Social History:  reports that she quit smoking about 13 years ago. Her smoking use included cigarettes. She started smoking about 38 years ago. She has a 18.8 pack-year smoking history. She has never used smokeless tobacco. She reports that she does not currently use alcohol. She reports that she does not use drugs.  Allergies:  Allergies  Allergen Reactions   Fish Allergy Anaphylaxis, Hives, Swelling and Other (See Comments)   Peanuts [Peanut Oil] Anaphylaxis, Hives, Itching and Swelling  Penicillins Hives, Itching, Swelling and Other (See Comments)    PATIENT HAS HAD A PCN REACTION WITH IMMEDIATE RASH, FACIAL/TONGUE/THROAT SWELLING, SOB, OR LIGHTHEADEDNESS WITH HYPOTENSION:  #  #  YES  #  #  HAS PT DEVELOPED SEVERE RASH INVOLVING MUCUS MEMBRANES or SKIN NECROSIS: #  #  YES  #  # PATIENT HAS HAD A PCN REACTION THAT REQUIRED HOSPITALIZATION:  #  #  YES  #  #  Has patient had a PCN reaction occurring within the last 10 years: No    Iodinated Contrast Media Hives, Itching and Swelling    SWELLING REACTION UNSPECIFIED    Iodine Hives, Itching  and Swelling    Patient reports receiving IV dye in abdominal CT scan without issue   Percocet [Oxycodone-Acetaminophen ] Nausea Only, Anxiety and Other (See Comments)    Raised blood pressure, Sweats, nervous,dizziness   2,4-D Dimethylamine Other (See Comments)   Peanut (Diagnostic) Other (See Comments)   Peanut Allergen Powder-Dnfp    Hydrocodone Itching and Nausea Only   Other Other (See Comments)    UNSPECIFIED REACTION  Hypersensitive to perfumes, colognes, powders, lotions, room sprays   Oxycodone Itching, Nausea Only and Other (See Comments)    Sweats and dizziness   Sulfa Antibiotics     UNSPECIFIED REACTION     Prior to Admission medications   Medication Sig Start Date End Date Taking? Authorizing Provider  ACCU-CHEK GUIDE TEST test strip 1 each 3 (three) times daily. 03/12/24  Yes [provider]  Accu-Chek Softclix Lancets lancets  02/29/24  Yes [provider]  acetaminophen  (TYLENOL ) 650 MG CR tablet Take 650 mg by mouth every 8 (eight) hours as needed for pain.   Yes [provider]  albuterol  (PROVENTIL ) (2.5 MG/3ML) 0.083% nebulizer solution USE 1 UNIT DOSE(VIAL)VIA NEBULIZER 4 TIMES A DAY AS NEEDED AS DIRECTED 02/15/21  Yes Okey Vina GAILS, MD  albuterol  (VENTOLIN  HFA) 108 (90 Base) MCG/ACT inhaler INHALE 2 PUFFS BY MOUTH EVERY 6 HRS AS NEEDED 06/25/23  Yes Kara Dorn NOVAK, MD  amLODipine  (NORVASC ) 5 MG tablet 1 tab(s) orally once a day; Duration: 90 days   Yes [provider]  aspirin  EC 81 MG tablet Take 1 tablet (81 mg total) by mouth daily. 12/12/18  Yes Weaver, Scott T, PA-C  atorvastatin  (LIPITOR) 40 MG tablet ONE TABLET BY MOUTH DAILY 05/19/21  Yes Okey Vina GAILS, MD  azithromycin  (ZITHROMAX ) 250 MG tablet Take 1 tablet (250 mg total) by mouth as directed. 07/24/24  Yes Kara Dorn NOVAK, MD  Blood Glucose Monitoring Suppl (ACCU-CHEK GUIDE) w/Device KIT as directed. 01/08/24  Yes [provider]  bumetanide  (BUMEX ) 2 MG tablet 1  tab(s) orally twice weekly   Yes [provider]  cetirizine (ZYRTEC) 10 MG tablet 1 tab(s) orally once a day; Duration: 30 days As needed 08/20/17  Yes [provider]  cholecalciferol (VITAMIN D3) 25 MCG (1000 UNIT) tablet Take 1,000 Units by mouth daily. 01/31/24  Yes [provider]  diclofenac  Sodium (VOLTAREN ) 1 % GEL APPLY 2 G TOPICALLY 4 TIMES A DAY 06/18/24  Yes Lanny Callander, MD  diphenoxylate-atropine (LOMOTIL) 2.5-0.025 MG tablet Take 1 tablet by mouth 4 (four) times daily as needed for diarrhea or loose stools.    Yes [provider]  ferrous sulfate  324 (65 Fe) MG TBEC 1 tabet PO daily; Duration: 90 days   Yes [provider]  fluticasone  (FLONASE) 50 MCG/ACT nasal spray Place 1 spray into both nostrils daily as  needed for allergies or rhinitis.   Yes [provider]  fluticasone -salmeterol (ADVAIR) 500-50 MCG/ACT AEPB Inhale 1 puff into the lungs 2 (two) times daily. 03/28/24  Yes [provider]  folic acid  (FOLVITE ) 1 MG tablet ONE TABLET BY MOUTH DAILY 05/24/22  Yes Okey Vina GAILS, MD  guaiFENesin  (MUCINEX ) 600 MG 12 hr tablet Take 2 tablets (1,200 mg total) by mouth 2 (two) times daily as needed for cough or to loosen phlegm. 02/01/24  Yes Hope Almarie ORN, NP  lidocaine  (LIDODERM ) 5 % 1 patch daily as needed. 07/23/24  Yes [provider]  losartan (COZAAR) 50 MG tablet TAKE 1 TABLET BY MOUTH DAILY 08/27/20  Yes Ross, Paula V, MD  metFORMIN (GLUCOPHAGE) 500 MG tablet Take 500 mg by mouth 2 (two) times daily. 05/24/22  Yes [provider]  montelukast  (SINGULAIR ) 10 MG tablet TAKE 1 TABLET BY MOUTH DAILY 05/19/21  Yes Kara Dorn NOVAK, MD  pantoprazole  (PROTONIX ) 40 MG tablet ONE TABLET BY MOUTH DAILY 12/22/21  Yes Lanny Callander, MD  SPIRIVA  RESPIMAT 1.25 MCG/ACT AERS 2 puff(s) inhaled once a day; Duration: 30 days   Yes [provider]  traMADol  (ULTRAM ) 50 MG tablet Take 50 mg by mouth 3 (three) times daily  as needed. 05/03/24  Yes [provider]  ZETIA  10 MG tablet 1 tab(s) orally once a day; Duration: 90 days 01/25/21  Yes [provider]    Blood pressure 130/74, pulse 67, temperature 98.1 F (36.7 C), temperature source Oral, height 5' (1.524 m), weight 200 lb (90.7 kg), SpO2 93%. Exam: General: Communicates without difficulty, well nourished, no acute distress. Head: Normocephalic, no evidence injury, no tenderness, facial buttresses intact without stepoff. Face/sinus: No tenderness to palpation and percussion. Facial movement is normal and symmetric. Eyes: PERRL, EOMI. No scleral icterus, conjunctivae clear. Neuro: CN II exam reveals vision grossly intact.  No nystagmus at any point of gaze. Ears: Auricles well formed without lesions.  Ear canals are intact without mass or lesion.  No erythema or edema is appreciated.  The TMs are intact without fluid. Nose: External evaluation reveals normal support and skin without lesions.  Dorsum is intact.  Anterior rhinoscopy reveals normal mucosa over anterior aspect of inferior turbinates and intact septum.  No purulence noted. Oral:  Oral cavity and oropharynx are intact, symmetric, without erythema or edema.  Mucosa is moist without lesions. Neck: Full range of motion without pain.  There is no significant lymphadenopathy.  No masses palpable.  Thyroid  bed within normal limits to palpation.  Parotid glands and submandibular glands equal bilaterally without mass.  Trachea is midline. Neuro:  CN 2-12 grossly intact.   Her hearing test shows bilateral symmetric high-frequency sensorineural hearing loss.  Assessment: 1.  Bilateral sloping high-frequency sensorineural hearing loss.  This is likely secondary to routine presbycusis. 2.  Her ear canals, tympanic membranes, and middle ear spaces are all normal.  Plan: 1.  The physical exam findings and the hearing test results are reviewed with the patient. 2.  The patient is a candidate for  hearing amplification.  The hearing aid options are discussed. 3.  The patient will return for reevaluation in 1 year.  Anita Gonzalez 08/05/2024, 1:48 PM

## 2024-08-07 ENCOUNTER — Ambulatory Visit: Admitting: Hematology

## 2024-08-07 ENCOUNTER — Other Ambulatory Visit: Payer: Self-pay

## 2024-08-07 ENCOUNTER — Inpatient Hospital Stay

## 2024-08-08 ENCOUNTER — Inpatient Hospital Stay (HOSPITAL_BASED_OUTPATIENT_CLINIC_OR_DEPARTMENT_OTHER): Admitting: Hematology

## 2024-08-08 ENCOUNTER — Inpatient Hospital Stay

## 2024-08-08 ENCOUNTER — Telehealth: Payer: Self-pay

## 2024-08-08 ENCOUNTER — Other Ambulatory Visit: Payer: Self-pay

## 2024-08-08 VITALS — BP 124/58 | HR 65 | Temp 98.0°F | Resp 18 | Ht 60.0 in | Wt 200.7 lb

## 2024-08-08 DIAGNOSIS — K5521 Angiodysplasia of colon with hemorrhage: Secondary | ICD-10-CM | POA: Diagnosis not present

## 2024-08-08 DIAGNOSIS — D500 Iron deficiency anemia secondary to blood loss (chronic): Secondary | ICD-10-CM

## 2024-08-08 LAB — CBC WITH DIFFERENTIAL (CANCER CENTER ONLY)
Abs Immature Granulocytes: 0.02 K/uL (ref 0.00–0.07)
Basophils Absolute: 0 K/uL (ref 0.0–0.1)
Basophils Relative: 0 %
Eosinophils Absolute: 0.2 K/uL (ref 0.0–0.5)
Eosinophils Relative: 4 %
HCT: 31 % — ABNORMAL LOW (ref 36.0–46.0)
Hemoglobin: 9.5 g/dL — ABNORMAL LOW (ref 12.0–15.0)
Immature Granulocytes: 0 %
Lymphocytes Relative: 23 %
Lymphs Abs: 1.1 K/uL (ref 0.7–4.0)
MCH: 29.4 pg (ref 26.0–34.0)
MCHC: 30.6 g/dL (ref 30.0–36.0)
MCV: 96 fL (ref 80.0–100.0)
Monocytes Absolute: 0.4 K/uL (ref 0.1–1.0)
Monocytes Relative: 8 %
Neutro Abs: 3.1 K/uL (ref 1.7–7.7)
Neutrophils Relative %: 65 %
Platelet Count: 282 K/uL (ref 150–400)
RBC: 3.23 MIL/uL — ABNORMAL LOW (ref 3.87–5.11)
RDW: 14.8 % (ref 11.5–15.5)
WBC Count: 4.8 K/uL (ref 4.0–10.5)
nRBC: 0 % (ref 0.0–0.2)

## 2024-08-08 LAB — CMP (CANCER CENTER ONLY)
ALT: 8 U/L (ref 0–44)
AST: 10 U/L — ABNORMAL LOW (ref 15–41)
Albumin: 3.9 g/dL (ref 3.5–5.0)
Alkaline Phosphatase: 85 U/L (ref 38–126)
Anion gap: 5 (ref 5–15)
BUN: 15 mg/dL (ref 8–23)
CO2: 32 mmol/L (ref 22–32)
Calcium: 9.1 mg/dL (ref 8.9–10.3)
Chloride: 105 mmol/L (ref 98–111)
Creatinine: 1.37 mg/dL — ABNORMAL HIGH (ref 0.44–1.00)
GFR, Estimated: 40 mL/min — ABNORMAL LOW (ref >60)
Glucose, Bld: 98 mg/dL (ref 70–99)
Potassium: 4.3 mmol/L (ref 3.5–5.1)
Sodium: 142 mmol/L (ref 135–145)
Total Bilirubin: 0.2 mg/dL (ref 0.0–1.2)
Total Protein: 6.6 g/dL (ref 6.5–8.1)

## 2024-08-08 LAB — FERRITIN: Ferritin: 34 ng/mL (ref 11–307)

## 2024-08-08 LAB — IRON AND IRON BINDING CAPACITY (CC-WL,HP ONLY)
Iron: 33 ug/dL (ref 28–170)
Saturation Ratios: 10 % — ABNORMAL LOW (ref 10.4–31.8)
TIBC: 335 ug/dL (ref 250–450)
UIBC: 302 ug/dL (ref 148–442)

## 2024-08-08 LAB — VITAMIN B12: Vitamin B-12: 448 pg/mL (ref 180–914)

## 2024-08-08 NOTE — Assessment & Plan Note (Signed)
from GI AVM bleeding  -She has moderate anemia with baseline hemoglobin in 8-9 range, MCV normal, ferritin 16, serum iron 13, URBC elevated at 430, iron saturation 3%, this is consistent with iron deficient anemia -Anemia has been several years, likely secondary to slow GI bleeding. Her stool OB was positive. Colonoscopy showed AVM, last done in 03/2015. Her 03/15/18 Upper Endoscopy with Dr Elnoria Howard showed no evidence of bleeding.  -Her baseline SPEP and UPEP were negative.  -She also may have a component of anemia of chronic disease secondary to kidney disease and rheumatoid arthritis -She has received IV Feraheme as needed since 2016, most recently 02/2021. She responded well and continues oral iron BID.  -She continues to have progressively worse fatigue. She denies GI bleeding or overt blood loss. Labs reviewed, Hg 9. CMP and iron panel still pending. If her iron is low, I will give IV Ferahame in the near future. -Continue oral B12, Folic acid, iron ferrous sulfate BID daily. Will set up iv feraheme if ferritin<100  -Given symptoms, will monitor with labs and f/u every 2-3 months

## 2024-08-08 NOTE — Progress Notes (Signed)
 [facility]   Telephone:(336) [phone] [phone]) [phone]   Clinic Follow up Note   Patient Care Team: Catalina Bare, MD as PCP - General (Internal Medicine) Okey Vina GAILS, MD as PCP - Cardiology (Cardiology) Trudy Vaughan HERO, MD as Referring Physician (Specialist) Trudy Vaughan HERO, MD as Referring Physician (Specialist) Rollin Dover, MD as Consulting Physician (Gastroenterology) Vaughn Ozell Darilyn Mickey., MD as Referring Physician (Anesthesiology) Zan Factor, DPM as Consulting Physician (Podiatry) Lanny Callander, MD as Attending Physician (Hematology and Oncology) Mannam, Praveen, MD as Consulting Physician (Pulmonary Disease)  Date of Service:  08/08/2024  CHIEF COMPLAINT: f/u of anemia  CURRENT THERAPY:  IV iron  as needed if ferritin less than 100 Oral B12  Oncology History   Anemia, iron  deficiency from GI AVM bleeding  -She has moderate anemia with baseline hemoglobin in 8-9 range, MCV normal, ferritin 16, serum iron  13, URBC elevated at 430, iron  saturation 3%, this is consistent with iron  deficient anemia -Anemia has been several years, likely secondary to slow GI bleeding. Her stool OB was positive. Colonoscopy showed AVM, last done in 03/2015. Her 03/15/18 Upper Endoscopy with Dr Rollin showed no evidence of bleeding.  -Her baseline SPEP and UPEP were negative.  -She also may have a component of anemia of chronic disease secondary to kidney disease and rheumatoid arthritis -She has received IV Feraheme  as needed since 2016, most recently 02/2021. She responded well and continues oral iron  BID.  -She continues to have progressively worse fatigue. She denies GI bleeding or overt blood loss. Labs reviewed, Hg 9. CMP and iron  panel still pending. If her iron  is low, I will give IV Ferahame in the near future. -Continue oral B12, Folic acid , iron  ferrous sulfate  BID daily. Will set up iv feraheme  if ferritin<100  -Given symptoms, will monitor with labs and  f/u every 2-3 months   Assessment & Plan Iron  deficiency anemia due to chronic blood loss Chronic iron  deficiency anemia with hemoglobin levels well-managed at 9.5-9.6 g/dL, above the target of 9 g/dL. Previous hemoglobin levels were 10.3 and 9.9 g/dL. She reports feeling cold and weak, possibly related to anemia. B12 injections were last administered in April, currently taking B12 orally. Iron  levels are pending; if low, IV iron  will be administered. Previous iron  levels in May were adequate, but were low last year. She is able to recognize symptoms of low iron  levels. - Check iron  levels and administer IV iron  if levels are low - Schedule follow-up lab tests in three months - Arrange follow-up appointment in six months - Call her with lab results - Consider referral to a different office for IV iron  administration if needed   Plan -Lab reviewed, hemoglobin 9.5, ferritin 34, I will arrange Venofer  200 mg times 5 for her at [facility] office - Continue lab every 3 months - Phone visit in 6 months after lab    Discussed the use of AI scribe software for clinical note transcription with the patient, who gave verbal consent to proceed.  History of Present Illness Anita Gonzalez is a 78 year old female with anemia who presents for follow-up.  She experiences coldness and weakness. Her hemoglobin levels have ranged from 9.5 to 10.3, with the most recent level at 9.5. She receives intravenous iron  as needed, with the last check in May indicating good levels. She is awaiting today's iron  level results.  She was previously on B12 injections last year but currently takes B12 pills at home.     All  other systems were reviewed with the patient and are negative.  MEDICAL HISTORY:  Past Medical History:  Diagnosis Date   Acute kidney injury 01/11/2015   Alcohol abuse, in remission    Anemia    Anemia, iron  deficiency 01/28/2015   Arthritis    Asthma    Bronchiectasis (HCC) 03/07/2016   Bronchitis     COPD (chronic obstructive pulmonary disease) (HCC)    Diabetes mellitus without complication (HCC)    type 2   DM II (diabetes mellitus, type II), controlled (HCC) 01/06/2015   Dyspnea and respiratory abnormality 04/06/2015   Dysrhythmia    hx of palpitations   E-coli UTI 01/11/2015   Essential hypertension 01/06/2015   GERD (gastroesophageal reflux disease)    Hay fever    Hepatitis    c treated with pills   HLD (hyperlipidemia) 01/06/2015   Hypertension    IBS (irritable bowel syndrome)    ILD (interstitial lung disease) (HCC) 12/11/2016   Leg cramps, sleep related 12/01/2015   Pneumonia    Rheumatoid arthritis (HCC) 1996   Sinus tachycardia 01/11/2015   LT monitor 01/2019: normal sinus rhythm, occ short bursts of PAT, accelerated junctional rhythm, no sig arrhythmias.     SURGICAL HISTORY: Past Surgical History:  Procedure Laterality Date   ABDOMINAL HYSTERECTOMY     BALLOON DILATION N/A 03/15/2018   Procedure: BALLOON DILATION;  Surgeon: Rollin Dover, MD;  [facility];  Service: Endoscopy;  Laterality: N/A;   CARPAL TUNNEL RELEASE Left 04/19/2018   Procedure: CARPAL TUNNEL RELEASE;  Surgeon: Gillie Duncans, MD;  [facility]: [facility];  Service: Neurosurgery;  Laterality: Left;  CARPAL TUNNEL RELEASE   CESAREAN SECTION     COLONOSCOPY WITH PROPOFOL  N/A 04/02/2015   Procedure: COLONOSCOPY WITH PROPOFOL ;  Surgeon: Dover Rollin, MD;  Location: [facility];  Service: Endoscopy;  Laterality: N/A;   ESOPHAGOGASTRODUODENOSCOPY (EGD) WITH PROPOFOL  N/A 04/02/2015   Procedure: ESOPHAGOGASTRODUODENOSCOPY (EGD) WITH PROPOFOL ;  Surgeon: Dover Rollin, MD;  [facility];  Service: Endoscopy;  Laterality: N/A;   ESOPHAGOGASTRODUODENOSCOPY (EGD) WITH PROPOFOL  N/A 03/15/2018   Procedure: ESOPHAGOGASTRODUODENOSCOPY (EGD) WITH PROPOFOL ;  Surgeon: Rollin Dover, MD;  [facility]: [facility];  Service: Endoscopy;  Laterality: N/A;   HEMORRHOID SURGERY     HERNIA REPAIR     HERNIA REPAIR       I have reviewed the social history and family history with the patient and they are unchanged from previous note.  ALLERGIES:  is allergic to fish allergy; peanuts [peanut oil]; penicillins; iodinated contrast media; iodine; percocet [oxycodone-acetaminophen ]; 2,4-d dimethylamine; peanut (diagnostic); peanut allergen powder-dnfp; hydrocodone; other; oxycodone; and sulfa antibiotics.  MEDICATIONS:  Current Outpatient Medications  Medication Sig Dispense Refill   ACCU-CHEK GUIDE TEST test strip 1 each 3 (three) times daily.     Accu-Chek Softclix Lancets lancets      acetaminophen  (TYLENOL ) 650 MG CR tablet Take 650 mg by mouth every 8 (eight) hours as needed for pain.     albuterol  (PROVENTIL ) (2.5 MG/3ML) 0.083% nebulizer solution USE 1 UNIT DOSE(VIAL)VIA NEBULIZER 4 TIMES A DAY AS NEEDED AS DIRECTED 1080 mL 3   albuterol  (VENTOLIN  HFA) 108 (90 Base) MCG/ACT inhaler INHALE 2 PUFFS BY MOUTH EVERY 6 HRS AS NEEDED 54 g 4   amLODipine  (NORVASC ) 5 MG tablet 1 tab(s) orally once a day; Duration: 90 days     aspirin  EC 81 MG tablet Take 1 tablet (81 mg total) by mouth daily. 90 tablet 3   atorvastatin  (LIPITOR) 40 MG tablet ONE  TABLET BY MOUTH DAILY 90 tablet 0   azithromycin  (ZITHROMAX ) 250 MG tablet Take 1 tablet (250 mg total) by mouth as directed. 6 tablet 0   Blood Glucose Monitoring Suppl (ACCU-CHEK GUIDE) w/Device KIT as directed.     bumetanide  (BUMEX ) 2 MG tablet 1 tab(s) orally twice weekly     cetirizine (ZYRTEC) 10 MG tablet 1 tab(s) orally once a day; Duration: 30 days As needed     cholecalciferol (VITAMIN D3) 25 MCG (1000 UNIT) tablet Take 1,000 Units by mouth daily.     diclofenac  Sodium (VOLTAREN ) 1 % GEL APPLY 2 G TOPICALLY 4 TIMES A DAY 700 g 4   diphenoxylate-atropine (LOMOTIL) 2.5-0.025 MG tablet Take 1 tablet by mouth 4 (four) times daily as needed for diarrhea or loose stools.      ferrous sulfate  324 (65 Fe) MG TBEC 1 tabet PO daily; Duration: 90 days     fluticasone   (FLONASE) 50 MCG/ACT nasal spray Place 1 spray into both nostrils daily as needed for allergies or rhinitis.     fluticasone -salmeterol (ADVAIR) 500-50 MCG/ACT AEPB Inhale 1 puff into the lungs 2 (two) times daily.     folic acid  (FOLVITE ) 1 MG tablet ONE TABLET BY MOUTH DAILY 90 tablet 0   guaiFENesin  (MUCINEX ) 600 MG 12 hr tablet Take 2 tablets (1,200 mg total) by mouth 2 (two) times daily as needed for cough or to loosen phlegm. 30 tablet 0   lidocaine  (LIDODERM ) 5 % 1 patch daily as needed.     losartan (COZAAR) 50 MG tablet TAKE 1 TABLET BY MOUTH DAILY 90 tablet 3   metFORMIN (GLUCOPHAGE) 500 MG tablet Take 500 mg by mouth 2 (two) times daily.     montelukast  (SINGULAIR ) 10 MG tablet TAKE 1 TABLET BY MOUTH DAILY 90 tablet 3   pantoprazole  (PROTONIX ) 40 MG tablet ONE TABLET BY MOUTH DAILY 90 tablet 2   SPIRIVA  RESPIMAT 1.25 MCG/ACT AERS 2 puff(s) inhaled once a day; Duration: 30 days     traMADol  (ULTRAM ) 50 MG tablet Take 50 mg by mouth 3 (three) times daily as needed.     ZETIA  10 MG tablet 1 tab(s) orally once a day; Duration: 90 days     No current facility-administered medications for this visit.   Facility-Administered Medications Ordered in Other Visits  Medication Dose Route Frequency Provider Last Rate Last Admin   [MAR Hold] acetaminophen  (TYLENOL ) tablet 650 mg  650 mg Oral Once Lanny Callander, MD       [facility] Hold] diphenhydrAMINE  (BENADRYL ) capsule 25 mg  25 mg Oral Once Lanny Callander, MD       [facility] Hold] iron  sucrose (VENOFER ) injection 200 mg  200 mg Intravenous Once Lanny Callander, MD        PHYSICAL EXAMINATION: ECOG PERFORMANCE STATUS: 1 - Symptomatic but completely ambulatory  Vitals:   08/08/24 0937  BP: (!) 124/58  Pulse: 65  Resp: 18  Temp: 98 F (36.7 C)  SpO2: 96%   Wt Readings from Last 3 Encounters:  08/08/24 200 lb 11.2 oz (91 kg)  08/05/24 200 lb (90.7 kg)  08/01/24 200 lb (90.7 kg)     GENERAL:alert, no distress and comfortable SKIN: skin color, texture,  turgor are normal, no rashes or significant lesions EYES: normal, Conjunctiva are pink and non-injected, sclera clear NECK: supple, thyroid  normal size, non-tender, without nodularity LYMPH:  no palpable lymphadenopathy in the cervical, axillary  LUNGS: clear to auscultation and percussion with normal breathing effort HEART: regular rate &  rhythm and no murmurs and no lower extremity edema ABDOMEN:abdomen soft, non-tender and normal bowel sounds Musculoskeletal:no cyanosis of digits and no clubbing  NEURO: alert & oriented x 3 with fluent speech, no focal motor/sensory deficits  Physical Exam    LABORATORY DATA:  I have reviewed the data as listed    Latest Ref Rng & Units 08/08/2024    9:20 AM 08/01/2024    4:03 PM 03/19/2024   12:34 PM  CBC  WBC 4.0 - 10.5 K/uL 4.8  6.0  5.8   Hemoglobin 12.0 - 15.0 g/dL 9.5  89.6  9.9   Hematocrit 36.0 - 46.0 % 31.0  33.4  32.6    31.7   Platelets 150 - 400 K/uL 282  309  317         Latest Ref Rng & Units 08/08/2024    9:20 AM 08/01/2024    4:03 PM 03/19/2024   12:34 PM  CMP  Glucose 70 - 99 mg/dL 98  96  880   BUN 8 - 23 mg/dL 15  21  15    Creatinine 0.44 - 1.00 mg/dL 8.62  8.76  8.81   Sodium 135 - 145 mmol/L 142  144  142   Potassium 3.5 - 5.1 mmol/L 4.3  3.9  4.0   Chloride 98 - 111 mmol/L 105  102  103   CO2 22 - 32 mmol/L 32  25  32   Calcium  8.9 - 10.3 mg/dL 9.1  9.9  9.2   Total Protein 6.5 - 8.1 g/dL 6.6   6.6   Total Bilirubin 0.0 - 1.2 mg/dL 0.2   0.2   Alkaline Phos 38 - 126 U/L 85   75   AST 15 - 41 U/L 10   13   ALT 0 - 44 U/L 8   12       RADIOGRAPHIC STUDIES: I have personally reviewed the radiological images as listed and agreed with the findings in the report. No results found.    No orders of the defined types were placed in this encounter.  All questions were answered. The patient knows to call the clinic with any problems, questions or concerns. No barriers to learning was detected. The total time spent in  the appointment was 20 minutes, including review of chart and various tests results, discussions about plan of care and coordination of care plan     Onita Mattock, MD 08/08/2024

## 2024-08-08 NOTE — Telephone Encounter (Addendum)
 Contacted patient via telephone.  Went over provider's comments from below.  Provided patient w/ T# to [facility]. Let patient know to contact our office in 1 week if she has not been scheduled for IV iron  at [facility]. Patient voiced understanding.    ----- Message from Onita Mattock sent at 08/08/2024  3:41 PM EDT ----- Please let pt know her iron  level is below the goal and I have ordered iv venofer  200mg X5 at [facility] let her call us  if she does not hear from them in a week, thx

## 2024-08-11 ENCOUNTER — Telehealth: Payer: Self-pay

## 2024-08-11 ENCOUNTER — Other Ambulatory Visit: Payer: Self-pay

## 2024-08-11 NOTE — Telephone Encounter (Signed)
 Dr. Lanny, patient will be scheduled as soon as possible.  Auth Submission: NO AUTH NEEDED Site of care: Site of care: [facility] Payer: [employer] medicare and [place] medicaid Medication & CPT/J Code(s) submitted: Venofer  (Iron  Sucrose) J1756 Diagnosis Code:  Route of submission (phone, fax, portal):  Phone # Fax # Auth type: Buy/Bill PB Units/visits requested: 200mg  x 5 doses Reference number:  Approval from: 08/11/24 to 11/12/24

## 2024-08-12 NOTE — Progress Notes (Signed)
   08/12/2024 Name: Anita Gonzalez MRN: [id number] DOB: [birth year]  Chief Complaint  Patient presents with   Medication Management    Followed up with the patient to verify that she received her medications from [facility] pharmacy. She reports that she did received her medications, and that gabapentin was discontinued and not sent with her other medications.    Heather Factor, PharmD Clinical Pharmacist  [phone]

## 2024-08-13 ENCOUNTER — Inpatient Hospital Stay: Attending: Hematology

## 2024-08-13 ENCOUNTER — Ambulatory Visit: Admitting: Pulmonary Disease

## 2024-08-13 ENCOUNTER — Inpatient Hospital Stay: Admitting: Hematology

## 2024-08-14 DIAGNOSIS — M1711 Unilateral primary osteoarthritis, right knee: Secondary | ICD-10-CM | POA: Diagnosis not present

## 2024-08-14 DIAGNOSIS — Z79899 Other long term (current) drug therapy: Secondary | ICD-10-CM | POA: Diagnosis not present

## 2024-08-14 DIAGNOSIS — M1712 Unilateral primary osteoarthritis, left knee: Secondary | ICD-10-CM | POA: Diagnosis not present

## 2024-08-14 DIAGNOSIS — E7800 Pure hypercholesterolemia, unspecified: Secondary | ICD-10-CM | POA: Diagnosis not present

## 2024-08-14 DIAGNOSIS — R5383 Other fatigue: Secondary | ICD-10-CM | POA: Diagnosis not present

## 2024-08-14 DIAGNOSIS — M170 Bilateral primary osteoarthritis of knee: Secondary | ICD-10-CM | POA: Diagnosis not present

## 2024-08-14 DIAGNOSIS — D539 Nutritional anemia, unspecified: Secondary | ICD-10-CM | POA: Diagnosis not present

## 2024-08-14 DIAGNOSIS — Z131 Encounter for screening for diabetes mellitus: Secondary | ICD-10-CM | POA: Diagnosis not present

## 2024-08-14 DIAGNOSIS — E1122 Type 2 diabetes mellitus with diabetic chronic kidney disease: Secondary | ICD-10-CM | POA: Diagnosis not present

## 2024-08-14 DIAGNOSIS — N1831 Chronic kidney disease, stage 3a: Secondary | ICD-10-CM | POA: Diagnosis not present

## 2024-08-14 DIAGNOSIS — E559 Vitamin D deficiency, unspecified: Secondary | ICD-10-CM | POA: Diagnosis not present

## 2024-08-18 ENCOUNTER — Other Ambulatory Visit: Payer: Self-pay

## 2024-08-19 DIAGNOSIS — Z79899 Other long term (current) drug therapy: Secondary | ICD-10-CM | POA: Diagnosis not present

## 2024-08-20 ENCOUNTER — Ambulatory Visit: Admitting: Emergency Medicine

## 2024-08-20 ENCOUNTER — Ambulatory Visit

## 2024-08-21 DIAGNOSIS — R002 Palpitations: Secondary | ICD-10-CM | POA: Diagnosis not present

## 2024-08-22 ENCOUNTER — Ambulatory Visit: Payer: Self-pay | Admitting: Internal Medicine

## 2024-08-22 DIAGNOSIS — R002 Palpitations: Secondary | ICD-10-CM | POA: Diagnosis not present

## 2024-08-26 ENCOUNTER — Ambulatory Visit

## 2024-08-26 VITALS — BP 127/74 | HR 68 | Temp 97.3°F | Resp 20 | Ht 60.0 in | Wt 198.6 lb

## 2024-08-26 DIAGNOSIS — K922 Gastrointestinal hemorrhage, unspecified: Secondary | ICD-10-CM

## 2024-08-26 DIAGNOSIS — D500 Iron deficiency anemia secondary to blood loss (chronic): Secondary | ICD-10-CM | POA: Diagnosis not present

## 2024-08-26 DIAGNOSIS — D509 Iron deficiency anemia, unspecified: Secondary | ICD-10-CM

## 2024-08-26 MED ORDER — IRON SUCROSE 20 MG/ML IV SOLN
200.0000 mg | Freq: Once | INTRAVENOUS | Status: AC
  Administered 2024-08-26: 200 mg via INTRAVENOUS
  Filled 2024-08-26: qty 10

## 2024-08-26 NOTE — Progress Notes (Signed)
 Diagnosis: Iron Deficiency Anemia  Provider:  Chilton Greathouse MD  Procedure: IV Push  IV Type: Peripheral, IV Location: L Forearm  Venofer (Iron Sucrose), Dose: 200 mg  Post Infusion IV Care: Observation period completed and Peripheral IV Discontinued  Discharge: Condition: Good, Destination: Home . AVS Provided  Performed by:  Adriana Mccallum, RN

## 2024-08-26 NOTE — Patient Instructions (Signed)

## 2024-08-31 ENCOUNTER — Other Ambulatory Visit: Payer: Self-pay | Admitting: Internal Medicine

## 2024-09-01 ENCOUNTER — Ambulatory Visit

## 2024-09-01 VITALS — BP 127/67 | HR 74 | Temp 98.3°F | Resp 22 | Ht 61.0 in | Wt 196.6 lb

## 2024-09-01 DIAGNOSIS — D509 Iron deficiency anemia, unspecified: Secondary | ICD-10-CM | POA: Diagnosis not present

## 2024-09-01 DIAGNOSIS — D500 Iron deficiency anemia secondary to blood loss (chronic): Secondary | ICD-10-CM

## 2024-09-01 MED ORDER — IRON SUCROSE 20 MG/ML IV SOLN
200.0000 mg | Freq: Once | INTRAVENOUS | Status: AC
  Administered 2024-09-01: 200 mg via INTRAVENOUS
  Filled 2024-09-01: qty 10

## 2024-09-01 NOTE — Progress Notes (Signed)
 Diagnosis: Iron  Deficiency Anemia  Provider:  Praveen Mannam MD  Procedure: IV Push  IV Type: Peripheral, IV Location: L Upper Arm  Venofer  (Iron  Sucrose), Dose: 200 mg  Post Infusion IV Care: Observation period completed and Peripheral IV Discontinued  Discharge: Condition: Good, Destination: Home . AVS Declined  Performed by:  Waddell LOISE Freshwater, RN

## 2024-09-04 ENCOUNTER — Ambulatory Visit (INDEPENDENT_AMBULATORY_CARE_PROVIDER_SITE_OTHER): Admitting: *Deleted

## 2024-09-04 VITALS — BP 125/68 | HR 79 | Temp 97.6°F | Resp 20 | Ht 61.0 in | Wt 195.6 lb

## 2024-09-04 DIAGNOSIS — D500 Iron deficiency anemia secondary to blood loss (chronic): Secondary | ICD-10-CM

## 2024-09-04 DIAGNOSIS — D509 Iron deficiency anemia, unspecified: Secondary | ICD-10-CM | POA: Diagnosis not present

## 2024-09-04 MED ORDER — IRON SUCROSE 20 MG/ML IV SOLN
200.0000 mg | Freq: Once | INTRAVENOUS | Status: AC
  Administered 2024-09-04: 200 mg via INTRAVENOUS

## 2024-09-04 NOTE — Progress Notes (Signed)
 Diagnosis: Iron Deficiency Anemia  Provider:  Chilton Greathouse MD  Procedure: IV Push  IV Type: Peripheral, IV Location: L Forearm  Venofer (Iron Sucrose), Dose: 200 mg  Post Infusion IV Care: Observation period completed and Peripheral IV Discontinued  Discharge: Condition: Good, Destination: Home . AVS Provided  Performed by:  Forrest Moron, RN

## 2024-09-05 DIAGNOSIS — Z9181 History of falling: Secondary | ICD-10-CM | POA: Diagnosis not present

## 2024-09-05 DIAGNOSIS — M25512 Pain in left shoulder: Secondary | ICD-10-CM | POA: Diagnosis not present

## 2024-09-05 DIAGNOSIS — M1712 Unilateral primary osteoarthritis, left knee: Secondary | ICD-10-CM | POA: Diagnosis not present

## 2024-09-05 DIAGNOSIS — E1122 Type 2 diabetes mellitus with diabetic chronic kidney disease: Secondary | ICD-10-CM | POA: Diagnosis not present

## 2024-09-05 DIAGNOSIS — Z79899 Other long term (current) drug therapy: Secondary | ICD-10-CM | POA: Diagnosis not present

## 2024-09-05 DIAGNOSIS — M170 Bilateral primary osteoarthritis of knee: Secondary | ICD-10-CM | POA: Diagnosis not present

## 2024-09-05 DIAGNOSIS — N1831 Chronic kidney disease, stage 3a: Secondary | ICD-10-CM | POA: Diagnosis not present

## 2024-09-05 DIAGNOSIS — M25511 Pain in right shoulder: Secondary | ICD-10-CM | POA: Diagnosis not present

## 2024-09-08 ENCOUNTER — Ambulatory Visit (INDEPENDENT_AMBULATORY_CARE_PROVIDER_SITE_OTHER): Admitting: Podiatry

## 2024-09-08 DIAGNOSIS — Z91199 Patient's noncompliance with other medical treatment and regimen due to unspecified reason: Secondary | ICD-10-CM

## 2024-09-08 NOTE — Progress Notes (Signed)
 Cancel 24 hours

## 2024-09-09 ENCOUNTER — Telehealth: Payer: Self-pay

## 2024-09-09 NOTE — Progress Notes (Signed)
   09/09/2024  Patient ID: Anita Gonzalez, female   DOB: 1945/12/26, 78 y.o.   MRN: [id number]  Contacted patient regarding referral for medication management from Catalina Bare, MD .   Appointment scheduled for 09/23/24 at 3:00 pm.   Heather Factor, PharmD Clinical Pharmacist  [phone]

## 2024-09-10 ENCOUNTER — Ambulatory Visit (INDEPENDENT_AMBULATORY_CARE_PROVIDER_SITE_OTHER)

## 2024-09-10 ENCOUNTER — Telehealth: Payer: Self-pay | Admitting: Internal Medicine

## 2024-09-10 VITALS — BP 113/70 | HR 68 | Temp 98.8°F | Resp 16 | Ht 60.0 in | Wt 194.0 lb

## 2024-09-10 DIAGNOSIS — D500 Iron deficiency anemia secondary to blood loss (chronic): Secondary | ICD-10-CM | POA: Diagnosis not present

## 2024-09-10 DIAGNOSIS — D509 Iron deficiency anemia, unspecified: Secondary | ICD-10-CM | POA: Diagnosis not present

## 2024-09-10 MED ORDER — IRON SUCROSE 20 MG/ML IV SOLN
200.0000 mg | Freq: Once | INTRAVENOUS | Status: AC
  Administered 2024-09-10: 200 mg via INTRAVENOUS
  Filled 2024-09-10: qty 10

## 2024-09-10 NOTE — Telephone Encounter (Signed)
 Pt called to receive result of her heart monitor please advise

## 2024-09-10 NOTE — Telephone Encounter (Signed)
 Patient identification verified by 2 forms.   Called and spoke to patient  Patient states:  -Went over monitor results.  -Pt seemed to want to start a medication for the irregular heart beat -it's hard for me to go from room to room sometimes. I have to stop and take a deep breath sometimes.              Interventions/Plan: -Pt advised to move her body even if it's a little at a time.  -Pt advised to keep her upcoming appt and bring questions with her to the appointment.   Patient agrees with plan, no questions at this time

## 2024-09-10 NOTE — Progress Notes (Signed)
 Diagnosis: Iron Deficiency Anemia  Provider:  Chilton Greathouse MD  Procedure: IV Push  IV Type: Peripheral, IV Location: L Forearm  Venofer (Iron Sucrose), Dose: 200 mg  Post Infusion IV Care: Observation period completed and Peripheral IV Discontinued  Discharge: Condition: Good, Destination: Home . AVS Provided  Performed by:  Garnette Czech, RN

## 2024-09-15 ENCOUNTER — Ambulatory Visit (INDEPENDENT_AMBULATORY_CARE_PROVIDER_SITE_OTHER)

## 2024-09-15 VITALS — BP 128/76 | HR 70 | Temp 98.8°F | Resp 18 | Ht 60.0 in | Wt 192.6 lb

## 2024-09-15 DIAGNOSIS — D500 Iron deficiency anemia secondary to blood loss (chronic): Secondary | ICD-10-CM

## 2024-09-15 DIAGNOSIS — D509 Iron deficiency anemia, unspecified: Secondary | ICD-10-CM

## 2024-09-15 MED ORDER — IRON SUCROSE 20 MG/ML IV SOLN
200.0000 mg | Freq: Once | INTRAVENOUS | Status: AC
  Administered 2024-09-15: 200 mg via INTRAVENOUS
  Filled 2024-09-15: qty 10

## 2024-09-15 NOTE — Progress Notes (Signed)
 Diagnosis: Iron Deficiency Anemia  Provider:  Chilton Greathouse MD  Procedure: IV Push  IV Type: Peripheral, IV Location: L Forearm  Venofer (Iron Sucrose), Dose: 200 mg  Post Infusion IV Care: Observation period completed and Peripheral IV Discontinued  Discharge: Condition: Good, Destination: Home . AVS Provided  Performed by:  Garnette Czech, RN

## 2024-09-17 ENCOUNTER — Inpatient Hospital Stay: Admitting: Hematology

## 2024-09-17 ENCOUNTER — Inpatient Hospital Stay

## 2024-09-17 DIAGNOSIS — J4489 Other specified chronic obstructive pulmonary disease: Secondary | ICD-10-CM | POA: Diagnosis not present

## 2024-09-17 DIAGNOSIS — D509 Iron deficiency anemia, unspecified: Secondary | ICD-10-CM | POA: Diagnosis not present

## 2024-09-17 DIAGNOSIS — Z0001 Encounter for general adult medical examination with abnormal findings: Secondary | ICD-10-CM | POA: Diagnosis not present

## 2024-09-17 DIAGNOSIS — I5032 Chronic diastolic (congestive) heart failure: Secondary | ICD-10-CM | POA: Diagnosis not present

## 2024-09-17 DIAGNOSIS — E1122 Type 2 diabetes mellitus with diabetic chronic kidney disease: Secondary | ICD-10-CM | POA: Diagnosis not present

## 2024-09-17 DIAGNOSIS — I130 Hypertensive heart and chronic kidney disease with heart failure and stage 1 through stage 4 chronic kidney disease, or unspecified chronic kidney disease: Secondary | ICD-10-CM | POA: Diagnosis not present

## 2024-09-17 DIAGNOSIS — N1831 Chronic kidney disease, stage 3a: Secondary | ICD-10-CM | POA: Diagnosis not present

## 2024-09-23 ENCOUNTER — Telehealth: Payer: Self-pay

## 2024-09-23 NOTE — Progress Notes (Signed)
   09/23/2024  Patient ID: Anita Gonzalez Menton, female   DOB: 1946-06-06, 78 y.o.   MRN: [id number]  Contacted patient regarding referral for medication management from Catalina Bare, MD .   Attempted to contact patient for scheduled appointment for medication management. Left HIPAA compliant message for patient to return my call at their convenience.    Heather Factor, PharmD Clinical Pharmacist  [phone]

## 2024-09-24 DIAGNOSIS — J4489 Other specified chronic obstructive pulmonary disease: Secondary | ICD-10-CM | POA: Diagnosis not present

## 2024-09-24 DIAGNOSIS — I4891 Unspecified atrial fibrillation: Secondary | ICD-10-CM | POA: Diagnosis not present

## 2024-09-24 DIAGNOSIS — N1831 Chronic kidney disease, stage 3a: Secondary | ICD-10-CM | POA: Diagnosis not present

## 2024-09-24 DIAGNOSIS — Z9181 History of falling: Secondary | ICD-10-CM | POA: Diagnosis not present

## 2024-09-24 DIAGNOSIS — Z9989 Dependence on other enabling machines and devices: Secondary | ICD-10-CM | POA: Diagnosis not present

## 2024-09-24 DIAGNOSIS — Z823 Family history of stroke: Secondary | ICD-10-CM | POA: Diagnosis not present

## 2024-09-24 DIAGNOSIS — K219 Gastro-esophageal reflux disease without esophagitis: Secondary | ICD-10-CM | POA: Diagnosis not present

## 2024-09-24 DIAGNOSIS — D520 Dietary folate deficiency anemia: Secondary | ICD-10-CM | POA: Diagnosis not present

## 2024-09-24 DIAGNOSIS — R197 Diarrhea, unspecified: Secondary | ICD-10-CM | POA: Diagnosis not present

## 2024-09-24 DIAGNOSIS — Z791 Long term (current) use of non-steroidal anti-inflammatories (NSAID): Secondary | ICD-10-CM | POA: Diagnosis not present

## 2024-09-24 DIAGNOSIS — Z7982 Long term (current) use of aspirin: Secondary | ICD-10-CM | POA: Diagnosis not present

## 2024-09-24 DIAGNOSIS — M1990 Unspecified osteoarthritis, unspecified site: Secondary | ICD-10-CM | POA: Diagnosis not present

## 2024-09-24 DIAGNOSIS — I129 Hypertensive chronic kidney disease with stage 1 through stage 4 chronic kidney disease, or unspecified chronic kidney disease: Secondary | ICD-10-CM | POA: Diagnosis not present

## 2024-09-24 DIAGNOSIS — E785 Hyperlipidemia, unspecified: Secondary | ICD-10-CM | POA: Diagnosis not present

## 2024-09-24 DIAGNOSIS — Z79899 Other long term (current) drug therapy: Secondary | ICD-10-CM | POA: Diagnosis not present

## 2024-09-24 DIAGNOSIS — J309 Allergic rhinitis, unspecified: Secondary | ICD-10-CM | POA: Diagnosis not present

## 2024-09-24 DIAGNOSIS — Z7984 Long term (current) use of oral hypoglycemic drugs: Secondary | ICD-10-CM | POA: Diagnosis not present

## 2024-09-24 DIAGNOSIS — E1122 Type 2 diabetes mellitus with diabetic chronic kidney disease: Secondary | ICD-10-CM | POA: Diagnosis not present

## 2024-09-24 DIAGNOSIS — I2510 Atherosclerotic heart disease of native coronary artery without angina pectoris: Secondary | ICD-10-CM | POA: Diagnosis not present

## 2024-10-06 ENCOUNTER — Ambulatory Visit: Admitting: Pulmonary Disease

## 2024-10-08 ENCOUNTER — Other Ambulatory Visit: Payer: Self-pay | Admitting: Pulmonary Disease

## 2024-10-08 DIAGNOSIS — Z79899 Other long term (current) drug therapy: Secondary | ICD-10-CM | POA: Diagnosis not present

## 2024-10-08 DIAGNOSIS — M1712 Unilateral primary osteoarthritis, left knee: Secondary | ICD-10-CM | POA: Diagnosis not present

## 2024-10-08 DIAGNOSIS — M170 Bilateral primary osteoarthritis of knee: Secondary | ICD-10-CM | POA: Diagnosis not present

## 2024-10-08 DIAGNOSIS — J4550 Severe persistent asthma, uncomplicated: Secondary | ICD-10-CM

## 2024-10-08 DIAGNOSIS — E1122 Type 2 diabetes mellitus with diabetic chronic kidney disease: Secondary | ICD-10-CM | POA: Diagnosis not present

## 2024-10-08 DIAGNOSIS — Z9181 History of falling: Secondary | ICD-10-CM | POA: Diagnosis not present

## 2024-10-08 DIAGNOSIS — M25512 Pain in left shoulder: Secondary | ICD-10-CM | POA: Diagnosis not present

## 2024-10-08 DIAGNOSIS — N1831 Chronic kidney disease, stage 3a: Secondary | ICD-10-CM | POA: Diagnosis not present

## 2024-10-08 DIAGNOSIS — M25511 Pain in right shoulder: Secondary | ICD-10-CM | POA: Diagnosis not present

## 2024-10-13 ENCOUNTER — Ambulatory Visit: Admitting: Pulmonary Disease

## 2024-10-15 DIAGNOSIS — Z79899 Other long term (current) drug therapy: Secondary | ICD-10-CM | POA: Diagnosis not present

## 2024-10-22 ENCOUNTER — Ambulatory Visit: Admitting: Podiatry

## 2024-10-22 DIAGNOSIS — Z91199 Patient's noncompliance with other medical treatment and regimen due to unspecified reason: Secondary | ICD-10-CM

## 2024-10-22 NOTE — Progress Notes (Signed)
 No show

## 2024-10-26 NOTE — Progress Notes (Unsigned)
 Date:  10/28/2024   ID:  Anita Gonzalez, DOB 1946/02/13, MRN [id number]  Patient Location: Home Provider Location: Home  PCP:  Catalina Bare, MD Cardiologist:  Vina Gull, MD  Electrophysiologist:  None   Chief Complaint:  F/U for HTN, CAD, HL  History of Present Illness:    Anita Gonzalez is a 78 y.o. female with a history of CAD (on CT scan), DM, HTN, HL, COPD palpitations  2018 Pt had myoview  showing no ischemia  Soft tissue attenuation  12/10/18  EKG showed an accelerated junctional rhythm   72   Wore monitor which showed SR Occasional accelerated junctional Verapamil  eventually stopped   2022  Echo showed hyperdynamic LV   LVEF >75%  INtracavitary gradient of 49 mm Hg with valsalva 2022  Zio monitor showed rare PACs, PVCs  Short bursts of SVT   Aug 2024   Mild CVDz on USN     Sept 2025  Zio monitor showed   SB to SR 45 to 120.  Occasional PACs ;  Since  last seen in clinic in Sept 2025 the pt says  her breathing is OK  She denies CP   No significant palpitations No dizziness   Past Medical History:  Diagnosis Date   Acute kidney injury 01/11/2015   Alcohol abuse, in remission    Anemia    Anemia, iron  deficiency 01/28/2015   Arthritis    Asthma    Bronchiectasis (HCC) 03/07/2016   Bronchitis    COPD (chronic obstructive pulmonary disease) (HCC)    Diabetes mellitus without complication (HCC)    type 2   DM II (diabetes mellitus, type II), controlled (HCC) 01/06/2015   Dyspnea and respiratory abnormality 04/06/2015   Dysrhythmia    hx of palpitations   E-coli UTI 01/11/2015   Essential hypertension 01/06/2015   GERD (gastroesophageal reflux disease)    Hay fever    Hepatitis    c treated with pills   HLD (hyperlipidemia) 01/06/2015   Hypertension    IBS (irritable bowel syndrome)    ILD (interstitial lung disease) (HCC) 12/11/2016   Leg cramps, sleep related 12/01/2015   Pneumonia    Rheumatoid arthritis (HCC) 1996   Sinus tachycardia 01/11/2015   LT  monitor 01/2019: normal sinus rhythm, occ short bursts of PAT, accelerated junctional rhythm, no sig arrhythmias.    Past Surgical History:  Procedure Laterality Date   ABDOMINAL HYSTERECTOMY     BALLOON DILATION N/A 03/15/2018   Procedure: BALLOON DILATION;  Surgeon: Rollin Dover, MD;  Location: [facility];  Service: Endoscopy;  Laterality: N/A;   CARPAL TUNNEL RELEASE Left 04/19/2018   Procedure: CARPAL TUNNEL RELEASE;  Surgeon: Gillie Duncans, MD;  Location: [facility] OR;  Service: Neurosurgery;  Laterality: Left;  CARPAL TUNNEL RELEASE   CESAREAN SECTION     COLONOSCOPY WITH PROPOFOL  N/A 04/02/2015   Procedure: COLONOSCOPY WITH PROPOFOL ;  Surgeon: Dover Rollin, MD;  Location: [facility];  Service: Endoscopy;  Laterality: N/A;   ESOPHAGOGASTRODUODENOSCOPY (EGD) WITH PROPOFOL  N/A 04/02/2015   Procedure: ESOPHAGOGASTRODUODENOSCOPY (EGD) WITH PROPOFOL ;  Surgeon: Dover Rollin, MD;  Location: [facility];  Service: Endoscopy;  Laterality: N/A;   ESOPHAGOGASTRODUODENOSCOPY (EGD) WITH PROPOFOL  N/A 03/15/2018   Procedure: ESOPHAGOGASTRODUODENOSCOPY (EGD) WITH PROPOFOL ;  Surgeon: Rollin Dover, MD;  Location: [facility];  Service: Endoscopy;  Laterality: N/A;   HEMORRHOID SURGERY     HERNIA REPAIR     HERNIA REPAIR       Current Meds  Medication Sig  ACCU-CHEK GUIDE TEST test strip 1 each 3 (three) times daily.   Accu-Chek Softclix Lancets lancets    acetaminophen  (TYLENOL ) 650 MG CR tablet Take 650 mg by mouth every 8 (eight) hours as needed for pain.   albuterol  (PROVENTIL ) (2.5 MG/3ML) 0.083% nebulizer solution USE 1 UNIT DOSE(VIAL)VIA NEBULIZER 4 TIMES A DAY AS NEEDED AS DIRECTED   albuterol  (VENTOLIN  HFA) 108 (90 Base) MCG/ACT inhaler INHALE 2 PUFFS BY MOUTH EVERY 6 HRS AS NEEDED   amLODipine  (NORVASC ) 5 MG tablet 1 tab(s) orally once a day; Duration: 90 days   aspirin  EC 81 MG tablet Take 1 tablet (81 mg total) by mouth daily.   atorvastatin  (LIPITOR) 40 MG tablet ONE TABLET BY MOUTH DAILY    Blood Glucose Monitoring Suppl (ACCU-CHEK GUIDE) w/Device KIT as directed.   bumetanide  (BUMEX ) 2 MG tablet 1 tab(s) orally twice weekly   cholecalciferol (VITAMIN D3) 25 MCG (1000 UNIT) tablet Take 1,000 Units by mouth daily.   diclofenac  Sodium (VOLTAREN ) 1 % GEL APPLY 2 G TOPICALLY 4 TIMES A DAY   diphenoxylate-atropine (LOMOTIL) 2.5-0.025 MG tablet Take 1 tablet by mouth 4 (four) times daily as needed for diarrhea or loose stools.    ferrous sulfate  324 (65 Fe) MG TBEC 1 tabet PO daily; Duration: 90 days   fluticasone  (FLONASE) 50 MCG/ACT nasal spray Place 1 spray into both nostrils daily as needed for allergies or rhinitis.   fluticasone -salmeterol (ADVAIR) 500-50 MCG/ACT AEPB Inhale 1 puff into the lungs 2 (two) times daily.   folic acid  (FOLVITE ) 1 MG tablet ONE TABLET BY MOUTH DAILY   guaiFENesin  (MUCINEX ) 600 MG 12 hr tablet Take 2 tablets (1,200 mg total) by mouth 2 (two) times daily as needed for cough or to loosen phlegm.   lidocaine  (LIDODERM ) 5 % 1 patch daily as needed.   losartan (COZAAR) 50 MG tablet TAKE 1 TABLET BY MOUTH DAILY   metFORMIN (GLUCOPHAGE) 500 MG tablet Take 500 mg by mouth 2 (two) times daily.   montelukast  (SINGULAIR ) 10 MG tablet TAKE 1 TABLET BY MOUTH DAILY   MOVANTIK 12.5 MG TABS tablet Take 12.5 mg by mouth every morning.   pantoprazole  (PROTONIX ) 40 MG tablet ONE TABLET BY MOUTH DAILY   SPIRIVA  RESPIMAT 1.25 MCG/ACT AERS 2 puff(s) inhaled once a day; Duration: 30 days   traMADol  (ULTRAM ) 50 MG tablet Take 50 mg by mouth 3 (three) times daily as needed.   ZETIA  10 MG tablet 1 tab(s) orally once a day; Duration: 90 days     Allergies:   Fish allergy; Peanuts [peanut oil]; Penicillins; Iodinated contrast media; Iodine; Percocet [oxycodone-acetaminophen ]; 2,4-d dimethylamine; Peanut (diagnostic); Peanut allergen powder-dnfp; Hydrocodone; Other; Oxycodone; and Sulfa antibiotics   Social History   Tobacco Use   Smoking status: Former    Current packs/day:  0.00    Average packs/day: 0.8 packs/day for 25.0 years (18.8 ttl pk-yrs)    Types: Cigarettes    Start date: 01/11/1986    Quit date: 01/12/2011    Years since quitting: 13.8   Smokeless tobacco: Never  Vaping Use   Vaping status: Never Used  Substance Use Topics   Alcohol use: Not Currently    Comment: 2 years ago (2021)   Drug use: No     Family Hx: The patient's family history includes Asthma in an other family member; COPD in an other family member; Diabetes in her mother; Heart disease in an other family member; Heart failure in her mother; Hypertension in an other family  member; Stroke in an other family member; Throat cancer in her brother.  ROS:   Please see the history of present illness.     All other systems reviewed and are negative.     Labs/Other Tests and Data Reviewed:    EKG:  No ECG reviewed.  Sept 2022  Echo  1. There is a dynamic LV gradient . Peak gradient of 33 mmHg with an  increase to 49 mmHG with Valsalva. . Left ventricular ejection fraction,  by estimation, is >75%. The left ventricle has hyperdynamic function. The  left ventricle has no regional wall  motion abnormalities. Left ventricular diastolic parameters are consistent  with Grade I diastolic dysfunction (impaired relaxation).   2. Right ventricular systolic function is normal. The right ventricular  size is normal.   3. The mitral valve is abnormal. No evidence of mitral valve  regurgitation. Mild to moderate mitral stenosis.   4. The aortic valve is grossly normal. Aortic valve regurgitation is not  visualized. No aortic stenosis is present.   Recent Labs: 08/01/2024: Magnesium 1.8; TSH 1.030 08/08/2024: ALT 8; BUN 15; Creatinine 1.37; Hemoglobin 9.5; Platelet Count 282; Potassium 4.3; Sodium 142   Recent Lipid Panel Lab Results  Component Value Date/Time   CHOL 131 04/09/2019 03:30 PM   TRIG 71 04/09/2019 03:30 PM   HDL 45 04/09/2019 03:30 PM   CHOLHDL 2.9 04/09/2019 03:30 PM    CHOLHDL 3 05/07/2015 12:19 PM   LDLCALC 72 04/09/2019 03:30 PM    Wt Readings from Last 3 Encounters:  10/28/24 187 lb (84.8 kg)  09/15/24 192 lb 9.6 oz (87.4 kg)  09/10/24 194 lb (88 kg)     Objective:    Vital Signs:  BP (!) 110/52   Pulse 72   Ht 5' 1 (1.549 m)   Wt 187 lb (84.8 kg)   SpO2 95%   BMI 35.33 kg/m    Morbidly obese 78 yo in NAD Neck  JVP is normal   No bruits Lungs are CTA  MOving air Cardiac  RRR  No murmurs  Abd  Obese  Nontender Ext are without edema   ASSESSMENT & PLAN:    HTN  Blood pressure is very well controlled   Follow on current regimen   2  CAD  Pt with CAD on CT scan  No symptoms of angina   3  Hx palpitations   Multiple monitors shows rare to occasional PACs, PVCs   Stay hydrated    4  HL  Will get lipids   5  Metabolics   WIll check A1C     Follow up in Sept of 2026   Medication Adjustments/Labs and Tests Ordered: Current medicines are reviewed at length with the patient today.  Concerns regarding medicines are outlined above.   Tests Ordered: No orders of the defined types were placed in this encounter.   Medication Changes: No orders of the defined types were placed in this encounter.     Signed, Vina Gull, MD  10/28/2024 3:44 PM    [facility]

## 2024-10-28 ENCOUNTER — Encounter: Payer: Self-pay | Admitting: Internal Medicine

## 2024-10-28 ENCOUNTER — Ambulatory Visit: Attending: Internal Medicine | Admitting: Internal Medicine

## 2024-10-28 ENCOUNTER — Telehealth: Payer: Self-pay

## 2024-10-28 VITALS — BP 110/52 | HR 72 | Ht 61.0 in | Wt 187.0 lb

## 2024-10-28 DIAGNOSIS — Z79899 Other long term (current) drug therapy: Secondary | ICD-10-CM

## 2024-10-28 DIAGNOSIS — I2510 Atherosclerotic heart disease of native coronary artery without angina pectoris: Secondary | ICD-10-CM

## 2024-10-28 DIAGNOSIS — I10 Essential (primary) hypertension: Secondary | ICD-10-CM

## 2024-10-28 DIAGNOSIS — D509 Iron deficiency anemia, unspecified: Secondary | ICD-10-CM

## 2024-10-28 DIAGNOSIS — I119 Hypertensive heart disease without heart failure: Secondary | ICD-10-CM

## 2024-10-28 NOTE — Progress Notes (Signed)
° °  10/28/2024  Patient ID: Anita Gonzalez, female   DOB: 12-15-1945, 78 y.o.   MRN: [id number]  Contacted patient regarding follow-up for medication management from Osei-Bonsu, Zachary, MD .   I was able to speak to the patient today and she reports no medication issues or concerns at this time. She reports being out for an appointment and not have availability to discuss this further at this time.   She will reach out in the future with any questions or concerns. I will close out her case at this time.  Heather Factor, PharmD Clinical Pharmacist  [phone]

## 2024-10-28 NOTE — Patient Instructions (Addendum)
 Medication Instructions:  Your physician recommends that you continue on your current medications as directed. Please refer to the Current Medication list given to you today.  *If you need a refill on your cardiac medications before your next appointment, please call your pharmacy*  Lab Work: NMR panel, CMET and CBC If you have labs (blood work) drawn today and your tests are completely normal, you will receive your results only by: MyChart Message (if you have MyChart) OR A paper copy in the mail If you have any lab test that is abnormal or we need to change your treatment, we will call you to review the results.  Testing/Procedures: None ordered.   Follow-Up: At [facility], you and your health needs are our priority.  As part of our continuing mission to provide you with exceptional heart care, our providers are all part of one team.  This team includes your primary Cardiologist (physician) and Advanced Practice Providers or APPs (Physician Assistants and Nurse Practitioners) who all work together to provide you with the care you need, when you need it.  Your next appointment:    September 2026 with Dr Okey

## 2024-10-29 LAB — COMPREHENSIVE METABOLIC PANEL WITH GFR
ALT: 8 IU/L (ref 0–32)
AST: 9 IU/L (ref 0–40)
Albumin: 4.1 g/dL (ref 3.8–4.8)
Alkaline Phosphatase: 94 IU/L (ref 49–135)
BUN/Creatinine Ratio: 11 — ABNORMAL LOW (ref 12–28)
BUN: 14 mg/dL (ref 8–27)
Bilirubin Total: <0.2 mg/dL (ref 0.0–1.2)
CO2: 27 mmol/L (ref 20–29)
Calcium: 9.6 mg/dL (ref 8.7–10.3)
Chloride: 104 mmol/L (ref 96–106)
Creatinine, Ser: 1.25 mg/dL — ABNORMAL HIGH (ref 0.57–1.00)
Globulin, Total: 2.2 g/dL (ref 1.5–4.5)
Glucose: 88 mg/dL (ref 70–99)
Potassium: 4.5 mmol/L (ref 3.5–5.2)
Sodium: 144 mmol/L (ref 134–144)
Total Protein: 6.3 g/dL (ref 6.0–8.5)
eGFR: 45 mL/min/1.73 — ABNORMAL LOW (ref >59)

## 2024-10-29 LAB — HEMOGLOBIN A1C
Est. average glucose Bld gHb Est-mCnc: 120 mg/dL
Hgb A1c MFr Bld: 5.8 % — ABNORMAL HIGH (ref 4.8–5.6)

## 2024-10-29 LAB — NMR, LIPOPROFILE
Cholesterol, Total: 113 mg/dL (ref 100–199)
HDL Particle Number: 28.8 umol/L — ABNORMAL LOW (ref >=30.5)
HDL-C: 53 mg/dL (ref >39)
LDL Particle Number: 402 nmol/L (ref <1000)
LDL Size: 20 nm — ABNORMAL LOW (ref >20.5)
LDL-C (NIH Calc): 47 mg/dL (ref 0–99)
LP-IR Score: 37 (ref <=45)
Small LDL Particle Number: 261 nmol/L (ref <=527)
Triglycerides: 61 mg/dL (ref 0–149)

## 2024-10-29 LAB — CBC
Hematocrit: 34.8 % (ref 34.0–46.6)
Hemoglobin: 10.9 g/dL — ABNORMAL LOW (ref 11.1–15.9)
MCH: 30.3 pg (ref 26.6–33.0)
MCHC: 31.3 g/dL — ABNORMAL LOW (ref 31.5–35.7)
MCV: 97 fL (ref 79–97)
Platelets: 264 x10E3/uL (ref 150–450)
RBC: 3.6 x10E6/uL — ABNORMAL LOW (ref 3.77–5.28)
RDW: 14.4 % (ref 11.7–15.4)
WBC: 5.2 x10E3/uL (ref 3.4–10.8)

## 2024-11-03 ENCOUNTER — Ambulatory Visit: Payer: Self-pay | Admitting: Internal Medicine

## 2024-11-10 ENCOUNTER — Inpatient Hospital Stay

## 2024-11-12 ENCOUNTER — Ambulatory Visit: Admitting: Podiatry

## 2024-11-12 DIAGNOSIS — E611 Iron deficiency: Secondary | ICD-10-CM

## 2024-11-12 DIAGNOSIS — D500 Iron deficiency anemia secondary to blood loss (chronic): Secondary | ICD-10-CM

## 2024-11-14 ENCOUNTER — Other Ambulatory Visit

## 2024-11-14 ENCOUNTER — Inpatient Hospital Stay: Attending: Internal Medicine

## 2024-11-17 ENCOUNTER — Telehealth: Payer: Self-pay | Admitting: Internal Medicine

## 2024-11-17 DIAGNOSIS — Z1211 Encounter for screening for malignant neoplasm of colon: Secondary | ICD-10-CM

## 2024-11-17 NOTE — Telephone Encounter (Signed)
 Pt is asking if Dr. Okey can refer her to a gastroenterologist. Please advise.

## 2024-11-18 ENCOUNTER — Encounter: Payer: Self-pay | Admitting: *Deleted

## 2024-11-18 ENCOUNTER — Telehealth: Payer: Self-pay

## 2024-11-18 ENCOUNTER — Other Ambulatory Visit (HOSPITAL_BASED_OUTPATIENT_CLINIC_OR_DEPARTMENT_OTHER)

## 2024-11-18 NOTE — Progress Notes (Signed)
" ° °  11/18/2024  Patient ID: Anita Gonzalez, female   DOB: [birth year], 79 y.o.   MRN: [id number]  Contacted patient regarding referral for diabetes and medication management from Catalina Bare, MD .   Left patient a voicemail to return my call at their convenience  Heather Factor, PharmD Clinical Pharmacist  [phone]   "

## 2024-11-20 NOTE — Telephone Encounter (Signed)
 Left message to call back.

## 2024-11-24 NOTE — Progress Notes (Signed)
" ° °  11/24/2024  Patient ID: Anita Gonzalez, female   DOB: 10-Jan-1946, 79 y.o.   MRN: [id number]  Contacted patient regarding referral for diabetes and medication management from Catalina Bare, MD .   Left patient a voicemail to return my call at their convenience.  Heather Factor, PharmD Clinical Pharmacist  [phone]   "

## 2024-11-25 NOTE — Telephone Encounter (Signed)
 Left message for patient to call back

## 2024-11-25 NOTE — Telephone Encounter (Signed)
 Patient is returning call

## 2024-11-25 NOTE — Telephone Encounter (Signed)
 Patient returning call to nurse

## 2024-11-25 NOTE — Addendum Note (Signed)
 Addended by: DRENA MARTINIS, Jaydn Moscato L on: 11/25/2024 05:19 PM   Modules accepted: Orders

## 2024-11-25 NOTE — Telephone Encounter (Signed)
 Called patient back to let her know a referral has been placed.

## 2024-11-26 ENCOUNTER — Ambulatory Visit: Admitting: Pulmonary Disease

## 2024-12-17 ENCOUNTER — Ambulatory Visit (INDEPENDENT_AMBULATORY_CARE_PROVIDER_SITE_OTHER): Admitting: Podiatry

## 2024-12-17 DIAGNOSIS — Z91199 Patient's noncompliance with other medical treatment and regimen due to unspecified reason: Secondary | ICD-10-CM

## 2024-12-17 NOTE — Progress Notes (Signed)
 No show

## 2024-12-22 ENCOUNTER — Ambulatory Visit (INDEPENDENT_AMBULATORY_CARE_PROVIDER_SITE_OTHER): Admitting: Otolaryngology

## 2024-12-23 ENCOUNTER — Ambulatory Visit: Admitting: Primary Care

## 2025-01-28 ENCOUNTER — Ambulatory Visit: Admitting: Podiatry

## 2025-02-05 ENCOUNTER — Other Ambulatory Visit

## 2025-02-09 ENCOUNTER — Telehealth: Admitting: Hematology

## 2025-04-08 ENCOUNTER — Other Ambulatory Visit (HOSPITAL_BASED_OUTPATIENT_CLINIC_OR_DEPARTMENT_OTHER)
